# Patient Record
Sex: Male | Born: 1935 | Race: White | Hispanic: No | Smoking: Former smoker
Health system: Southern US, Community
[De-identification: ages and names within clinical notes are randomized; demographics above are authoritative.]

## PROBLEM LIST (undated history)

## (undated) DIAGNOSIS — I10 Essential (primary) hypertension: Secondary | ICD-10-CM

## (undated) DIAGNOSIS — J449 Chronic obstructive pulmonary disease, unspecified: Secondary | ICD-10-CM

## (undated) DIAGNOSIS — K219 Gastro-esophageal reflux disease without esophagitis: Secondary | ICD-10-CM

## (undated) DIAGNOSIS — J439 Emphysema, unspecified: Secondary | ICD-10-CM

## (undated) DIAGNOSIS — I739 Peripheral vascular disease, unspecified: Secondary | ICD-10-CM

## (undated) DIAGNOSIS — J438 Other emphysema: Secondary | ICD-10-CM

## (undated) DIAGNOSIS — H269 Unspecified cataract: Secondary | ICD-10-CM

## (undated) DIAGNOSIS — I498 Other specified cardiac arrhythmias: Secondary | ICD-10-CM

## (undated) DIAGNOSIS — G459 Transient cerebral ischemic attack, unspecified: Secondary | ICD-10-CM

## (undated) DIAGNOSIS — R7309 Other abnormal glucose: Secondary | ICD-10-CM

## (undated) DIAGNOSIS — E785 Hyperlipidemia, unspecified: Secondary | ICD-10-CM

## (undated) HISTORY — PX: EYE SURGERY: SHX253

## (undated) HISTORY — DX: Peripheral vascular disease, unspecified: I73.9

## (undated) HISTORY — DX: Emphysema, unspecified: J43.9

## (undated) HISTORY — DX: Unspecified cataract: H26.9

## (undated) HISTORY — PX: CATARACT EXTRACTION, BILATERAL: SHX1313

## (undated) HISTORY — PX: TONSILLECTOMY AND ADENOIDECTOMY: SUR1326

## (undated) HISTORY — DX: Other specified cardiac arrhythmias: I49.8

## (undated) HISTORY — PX: POLYPECTOMY: SHX149

## (undated) HISTORY — DX: Essential (primary) hypertension: I10

## (undated) HISTORY — DX: Other emphysema: J43.8

## (undated) HISTORY — DX: Hyperlipidemia, unspecified: E78.5

## (undated) HISTORY — DX: Other abnormal glucose: R73.09

## (undated) HISTORY — PX: COLONOSCOPY: SHX174

---

## 2004-08-28 ENCOUNTER — Ambulatory Visit: Payer: Self-pay | Admitting: Endocrinology

## 2004-11-28 ENCOUNTER — Ambulatory Visit: Payer: Self-pay | Admitting: Internal Medicine

## 2004-12-30 ENCOUNTER — Emergency Department (HOSPITAL_COMMUNITY): Admission: EM | Admit: 2004-12-30 | Discharge: 2004-12-30 | Payer: Self-pay | Admitting: Emergency Medicine

## 2005-01-06 ENCOUNTER — Ambulatory Visit: Payer: Self-pay | Admitting: Pulmonary Disease

## 2005-01-06 LAB — PULMONARY FUNCTION TEST

## 2005-01-13 ENCOUNTER — Ambulatory Visit: Payer: Self-pay | Admitting: Endocrinology

## 2005-01-15 ENCOUNTER — Ambulatory Visit: Payer: Self-pay | Admitting: Pulmonary Disease

## 2005-03-11 ENCOUNTER — Ambulatory Visit: Payer: Self-pay | Admitting: Pulmonary Disease

## 2005-08-07 ENCOUNTER — Ambulatory Visit: Payer: Self-pay | Admitting: Endocrinology

## 2005-09-03 ENCOUNTER — Ambulatory Visit: Payer: Self-pay | Admitting: Endocrinology

## 2005-11-11 ENCOUNTER — Ambulatory Visit: Payer: Self-pay | Admitting: Endocrinology

## 2005-11-11 ENCOUNTER — Ambulatory Visit: Payer: Self-pay | Admitting: Pulmonary Disease

## 2006-08-14 ENCOUNTER — Ambulatory Visit: Payer: Self-pay | Admitting: Endocrinology

## 2006-11-26 ENCOUNTER — Ambulatory Visit: Payer: Self-pay | Admitting: Endocrinology

## 2006-12-22 ENCOUNTER — Ambulatory Visit: Payer: Self-pay | Admitting: Endocrinology

## 2006-12-22 ENCOUNTER — Ambulatory Visit: Payer: Self-pay | Admitting: Pulmonary Disease

## 2006-12-22 LAB — CONVERTED CEMR LAB
ALT: 21 units/L (ref 0–40)
Albumin: 3.8 g/dL (ref 3.5–5.2)
Basophils Absolute: 0 10*3/uL (ref 0.0–0.1)
Basophils Relative: 0.2 % (ref 0.0–1.0)
Bilirubin Urine: NEGATIVE
Bilirubin, Direct: 0.2 mg/dL (ref 0.0–0.3)
Calcium: 8.9 mg/dL (ref 8.4–10.5)
Cholesterol: 107 mg/dL (ref 0–200)
Crystals: NEGATIVE
GFR calc Af Amer: 85 mL/min
GFR calc non Af Amer: 70 mL/min
Glucose, Bld: 122 mg/dL — ABNORMAL HIGH (ref 70–99)
HDL: 33.1 mg/dL — ABNORMAL LOW (ref >39.0)
Hemoglobin: 13.9 g/dL (ref 13.0–17.0)
Ketones, ur: NEGATIVE mg/dL
Lymphocytes Relative: 24.7 % (ref 12.0–46.0)
MCHC: 35 g/dL (ref 30.0–36.0)
Monocytes Absolute: 0.3 10*3/uL (ref 0.2–0.7)
Monocytes Relative: 4.4 % (ref 3.0–11.0)
Neutro Abs: 4.5 10*3/uL (ref 1.4–7.7)
Nitrite: NEGATIVE
PSA: 2.67 ng/mL (ref 0.10–4.00)
Specific Gravity, Urine: 1.015 (ref 1.000–1.03)
Urine Glucose: NEGATIVE mg/dL
Urobilinogen, UA: 0.2 (ref 0.0–1.0)
pH: 6.5 (ref 5.0–8.0)

## 2007-06-01 ENCOUNTER — Encounter: Payer: Self-pay | Admitting: Endocrinology

## 2007-06-01 DIAGNOSIS — R7309 Other abnormal glucose: Secondary | ICD-10-CM

## 2007-06-01 DIAGNOSIS — E785 Hyperlipidemia, unspecified: Secondary | ICD-10-CM

## 2007-06-01 DIAGNOSIS — R001 Bradycardia, unspecified: Secondary | ICD-10-CM | POA: Insufficient documentation

## 2007-06-01 DIAGNOSIS — I498 Other specified cardiac arrhythmias: Secondary | ICD-10-CM

## 2007-06-01 HISTORY — DX: Other specified cardiac arrhythmias: I49.8

## 2007-06-01 HISTORY — DX: Other abnormal glucose: R73.09

## 2007-06-01 HISTORY — DX: Hyperlipidemia, unspecified: E78.5

## 2007-09-21 ENCOUNTER — Ambulatory Visit: Payer: Self-pay | Admitting: Endocrinology

## 2008-03-08 ENCOUNTER — Ambulatory Visit: Payer: Self-pay | Admitting: Endocrinology

## 2008-03-09 ENCOUNTER — Telehealth (INDEPENDENT_AMBULATORY_CARE_PROVIDER_SITE_OTHER): Payer: Self-pay | Admitting: *Deleted

## 2008-03-28 ENCOUNTER — Ambulatory Visit: Payer: Self-pay | Admitting: Pulmonary Disease

## 2008-03-28 DIAGNOSIS — J449 Chronic obstructive pulmonary disease, unspecified: Secondary | ICD-10-CM

## 2008-03-28 DIAGNOSIS — J438 Other emphysema: Secondary | ICD-10-CM

## 2008-03-28 HISTORY — DX: Other emphysema: J43.8

## 2008-03-31 ENCOUNTER — Encounter: Payer: Self-pay | Admitting: Pulmonary Disease

## 2008-04-04 ENCOUNTER — Telehealth (INDEPENDENT_AMBULATORY_CARE_PROVIDER_SITE_OTHER): Payer: Self-pay | Admitting: *Deleted

## 2008-08-03 ENCOUNTER — Ambulatory Visit: Payer: Self-pay | Admitting: Endocrinology

## 2009-01-15 ENCOUNTER — Emergency Department (HOSPITAL_COMMUNITY): Admission: EM | Admit: 2009-01-15 | Discharge: 2009-01-15 | Payer: Self-pay | Admitting: Emergency Medicine

## 2009-01-15 ENCOUNTER — Telehealth (INDEPENDENT_AMBULATORY_CARE_PROVIDER_SITE_OTHER): Payer: Self-pay | Admitting: *Deleted

## 2009-01-19 ENCOUNTER — Ambulatory Visit: Payer: Self-pay | Admitting: Endocrinology

## 2009-01-19 DIAGNOSIS — I10 Essential (primary) hypertension: Secondary | ICD-10-CM | POA: Insufficient documentation

## 2009-01-19 HISTORY — DX: Essential (primary) hypertension: I10

## 2009-01-24 ENCOUNTER — Emergency Department (HOSPITAL_COMMUNITY): Admission: EM | Admit: 2009-01-24 | Discharge: 2009-01-24 | Payer: Self-pay | Admitting: Emergency Medicine

## 2009-01-29 ENCOUNTER — Ambulatory Visit: Payer: Self-pay | Admitting: Endocrinology

## 2009-01-29 DIAGNOSIS — J189 Pneumonia, unspecified organism: Secondary | ICD-10-CM | POA: Insufficient documentation

## 2009-01-29 DIAGNOSIS — R930 Abnormal findings on diagnostic imaging of skull and head, not elsewhere classified: Secondary | ICD-10-CM

## 2009-08-08 ENCOUNTER — Ambulatory Visit: Payer: Self-pay | Admitting: Endocrinology

## 2009-09-26 ENCOUNTER — Ambulatory Visit: Payer: Self-pay | Admitting: Pulmonary Disease

## 2010-03-25 ENCOUNTER — Telehealth: Payer: Self-pay | Admitting: Endocrinology

## 2010-04-23 ENCOUNTER — Ambulatory Visit: Payer: Self-pay | Admitting: Endocrinology

## 2010-06-10 ENCOUNTER — Telehealth: Payer: Self-pay | Admitting: Endocrinology

## 2010-08-01 ENCOUNTER — Ambulatory Visit: Payer: Self-pay | Admitting: Endocrinology

## 2010-11-10 LAB — CONVERTED CEMR LAB
ALT: 18 units/L (ref 0–53)
ALT: 19 units/L (ref 0–53)
AST: 21 units/L (ref 0–37)
Alkaline Phosphatase: 50 units/L (ref 39–117)
Alkaline Phosphatase: 50 units/L (ref 39–117)
BUN: 21 mg/dL (ref 6–23)
Basophils Absolute: 0.1 10*3/uL (ref 0.0–0.1)
Basophils Relative: 1 % (ref 0.0–1.0)
Basophils Relative: 1.1 % (ref 0.0–3.0)
Bilirubin, Direct: 0.2 mg/dL (ref 0.0–0.3)
CO2: 30 meq/L (ref 19–32)
Calcium: 9.1 mg/dL (ref 8.4–10.5)
Chloride: 107 meq/L (ref 96–112)
Cholesterol: 128 mg/dL (ref 0–200)
Cholesterol: 128 mg/dL (ref 0–200)
Creatinine, Ser: 1 mg/dL (ref 0.4–1.5)
Eosinophils Absolute: 0.3 10*3/uL (ref 0.0–0.7)
Eosinophils Absolute: 0.3 10*3/uL (ref 0.0–0.7)
Eosinophils Relative: 3.6 % (ref 0.0–5.0)
GFR calc Af Amer: 85 mL/min
HDL: 26.2 mg/dL — ABNORMAL LOW (ref >39.0)
HDL: 33.3 mg/dL — ABNORMAL LOW (ref >39.00)
Hemoglobin, Urine: NEGATIVE
Hgb A1c MFr Bld: 6.2 % (ref 4.6–6.5)
LDL Cholesterol: 68 mg/dL (ref 0–99)
LDL Cholesterol: 79 mg/dL (ref 0–99)
Leukocytes, UA: NEGATIVE
Lymphocytes Relative: 21 % (ref 12.0–46.0)
Lymphocytes Relative: 22.9 % (ref 12.0–46.0)
MCHC: 34.2 g/dL (ref 30.0–36.0)
MCV: 89.8 fL (ref 78.0–100.0)
Monocytes Absolute: 0.6 10*3/uL (ref 0.1–1.0)
Neutrophils Relative %: 65.9 % (ref 43.0–77.0)
Neutrophils Relative %: 67.7 % (ref 43.0–77.0)
PSA: 1.42 ng/mL (ref 0.10–4.00)
Platelets: 213 10*3/uL (ref 150–400)
Platelets: 224 10*3/uL (ref 150.0–400.0)
Potassium: 4.2 meq/L (ref 3.5–5.1)
RBC: 4.84 M/uL (ref 4.22–5.81)
RBC: 4.98 M/uL (ref 4.22–5.81)
Sodium: 141 meq/L (ref 135–145)
Specific Gravity, Urine: >=1.03 (ref 1.000–1.030)
Total Bilirubin: 0.9 mg/dL (ref 0.3–1.2)
Total CHOL/HDL Ratio: 4
Total Protein: 6.9 g/dL (ref 6.0–8.3)
Total Protein: 7.1 g/dL (ref 6.0–8.3)
Triglycerides: 134 mg/dL (ref 0.0–149.0)
Urobilinogen, UA: 0.2 (ref 0.0–1.0)
VLDL: 23 mg/dL (ref 0–40)
VLDL: 26.8 mg/dL (ref 0.0–40.0)
WBC: 9.8 10*3/uL (ref 4.5–10.5)
pH: 6 (ref 5.0–8.0)

## 2010-11-12 NOTE — Progress Notes (Signed)
  Phone Note Call from Patient Call back at Home Phone [phone]   Caller: Patient Call For: Minus Breeding MD Summary of Call: Pt currently takes,Triamterene 2 x's per week (cut back to this 6 mos ago). pt would like to take daily instead.(as it use to be) Pt would need chagned, prescription called in[facility]. Please advise. Please let pt know if this will happen. Initial call taken by: Verdell Face,  June 10, 2010 9:25 AM  Follow-up for Phone Call        left message on machine for pt to return my call. Margaret Pyle, CMA  June 10, 2010 11:26 AM  Pt states that his BP has been slightly elevated since decreasing BP meds and would like to go back to taking 1/2 tab once daily   [facility]   Follow-up by: Margaret Pyle, CMA,  June 10, 2010 1:51 PM  Additional Follow-up for Phone Call Additional follow up Details #1::        please do not increase--last ov said this would push bp too low. Additional Follow-up by: Minus Breeding MD,  June 10, 2010 2:09 PM    Additional Follow-up for Phone Call Additional follow up Details #2::    Pt states that SBP >140 and that it has not been this high, pt is very concerned. Pt is requesting to take BP meds 5 days a week if not once daily  Follow-up by: Margaret Pyle, CMA,  June 10, 2010 3:02 PM  Additional Follow-up for Phone Call Additional follow up Details #3:: Details for Additional Follow-up Action Taken: ov to address Additional Follow-up by: Minus Breeding MD,  June 10, 2010 4:14 PM  Pt informed via VM. Margaret Pyle, CMA  June 10, 2010 4:21 PM

## 2010-11-12 NOTE — Progress Notes (Signed)
Summary: Rx refill req  Phone Note Refill Request Message from:  Patient  Refills Requested: Medication #1:  ZOCOR 80 MG  TABS qhs.   Dosage confirmed as above?Dosage Confirmed   Supply Requested: 1 month Pt has scheduled appt with SAE. #30 refill to requesting pharmacy   Method Requested: Electronic Initial call taken by: Margaret Pyle, CMA,  March 25, 2010 3:48 PM    Prescriptions: ZOCOR 80 MG  TABS (SIMVASTATIN) qhs  #30 Tablet x 0   Entered by:   Margaret Pyle, CMA   Authorized by:   Minus Breeding MD   Signed by:   Margaret Pyle, CMA on 03/25/2010   Method used:   Electronically to        [facility]. [phone]* (retail)       [phone] [address]       [place], [place]  [address]       Ph: [phone]       Fax: [phone]   RxID:   [id number]

## 2010-11-12 NOTE — Assessment & Plan Note (Signed)
Summary: flu shot/sae/cd   Nurse Visit   Allergies: No Known Drug Allergies  Orders Added: 1)  Flu Vaccine 67yrs + MEDICARE PATIENTS [Q2039] 2)  Administration Flu vaccine - MCR [G0008] .lbmedflu   Flu Vaccine Consent Questions     Do you have a history of severe allergic reactions to this vaccine? no    Any prior history of allergic reactions to egg and/or gelatin? no    Do you have a sensitivity to the preservative Thimersol? no    Do you have a past history of Guillan-Barre Syndrome? no    Do you currently have an acute febrile illness? no    Have you ever had a severe reaction to latex? no    Vaccine information given and explained to patient? yes    Are you currently pregnant? no    Lot Number:[id number]   Exp Date:04/12/2011   Site Given  Left Deltoid IM Lanier Prude, [facility])  August 01, 2010 10:17 AM

## 2010-11-12 NOTE — Assessment & Plan Note (Signed)
Summary: CPX/SECURE HORIZONS/#/CD   Vital Signs:  Patient profile:   76 year old male Height:      72 inches (182.88 cm) Weight:      212 pounds (96.36 kg) BMI:     28.86 O2 Sat:      91 % on Room air Temp:     97.3 degrees F (36.28 degrees C) oral Pulse rate:   56 / minute BP sitting:   122 / 82  (left arm) Cuff size:   regular  Vitals Entered By: Brenton Grills MA (April 23, 2010 10:11 AM)  O2 Flow:  Room air CC: CPX/questions about Zocor, BP medication/aj   Primary Provider:  Everardo All  CC:  CPX/questions about Zocor and BP medication/aj.  History of Present Illness: here for regular wellness examination.  He's feeling pretty well in general, and does not drink etoh.  he seldom smokes.   Current Medications (verified): 1)  Advair Diskus 250-50 Mcg/dose  Misc (Fluticasone-Salmeterol) .... Inhale 1 Puff Two Times A Day 2)  Aspirin 81mg  .... Once Daily 3)  Proair Hfa 108 (90 Base) Mcg/act  Aers (Albuterol Sulfate) .Marland Kitchen.. 1-2 Puffs Every 4-6 Hours As Needed 4)  Zocor 80 Mg  Tabs (Simvastatin) .... Qhs  Allergies (verified): No Known Drug Allergies  Past History:  Past Medical History: Last updated: 03/08/2008 BRADYCARDIA (ICD-427.89) HYPERGLYCEMIA (ICD-790.29) HYPERLIPIDEMIA (ICD-272.4)  Family History: Reviewed history from 03/08/2008 and no changes required. mother had "male cancer"  Social History: Reviewed history from 03/08/2008 and no changes required. retired  married  Review of Systems  The patient denies fever, vision loss, decreased hearing, chest pain, syncope, dyspnea on exertion, prolonged cough, headaches, abdominal pain, melena, hematochezia, severe indigestion/heartburn, hematuria, suspicious skin lesions, and depression.         he has lost a few lbs, due to his efforts  Physical Exam  General:  normal appearance.   Head:  head: no deformity eyes: no periorbital swelling, no proptosis external nose and ears are normal mouth: no lesion  seen Neck:  Supple without thyroid enlargement or tenderness.  Lungs:  Clear to auscultation bilaterally. Normal respiratory effort.  Heart:  Regular rate and rhythm without murmurs or gallops noted. Normal S1,S2.   Abdomen:  abdomen is soft, nontender.  no hepatosplenomegaly.   not distended.  there is a self-reducing ventral hernia  Rectal:  normal external and internal exam.  heme neg  Prostate:  Normal size prostate without masses or tenderness.  Msk:  muscle bulk and strength are grossly normal.  no obvious joint swelling.  gait is normal and steady  Pulses:  dorsalis pedis intact bilat.  no carotid bruit  Extremities:  mycotic toenails.   no deformity.  no ulcer on the feet.  feet are of normal color and temp.  no edema  Neurologic:  cn 2-12 grossly intact.   readily moves all 4's.   sensation is intact to touch on the feet  Skin:  normal texture and temp.  no rash.  not diaphoretic  Cervical Nodes:  No significant adenopathy.  Psych:  Alert and cooperative; normal mood and affect; normal attention span and concentration.     Impression & Recommendations:  Problem # 1:  ROUTINE GENERAL MEDICAL EXAM@HEALTH  CARE FACL (ICD-V70.0)  Problem # 2:  HYPERTENSION (ICD-401.9) he needs a very small amount of maxzide  Medications Added to Medication List This Visit: 1)  Triamterene-hctz 37.5-25 Mg Caps (Triamterene-hctz) .... 1/2 tab twice a week  Other Orders: EKG w/ Interpretation (  93000) TLB-Lipid Panel (80061-LIPID) TLB-BMP (Basic Metabolic Panel-BMET) (80048-METABOL) TLB-CBC Platelet - w/Differential (85025-CBCD) TLB-Hepatic/Liver Function Pnl (80076-HEPATIC) TLB-TSH (Thyroid Stimulating Hormone) (84443-TSH) TLB-A1C / Hgb A1C (Glycohemoglobin) (83036-A1C) TLB-PSA (Prostate Specific Antigen) (84153-PSA) TLB-Udip w/ Micro (81001-URINE) Est. Patient 65& > (11914)  Patient Instructions: 1)  reduce maxzide to 1/2 pill twice a week. 2)  blood tests are being ordered for you  today.  please call [phone] to hear your test results. 3)  please consider these measures for your health:  minimize alcohol.  do not use tobacco products.  have a colonoscopy at least every 10 years from age 14.  keep firearms safely stored.  always use seat belts.  have working smoke alarms in your home.  see the dentist regularly.  never drive under the influence of alcohol or drugs (including prescription drugs).  those with fair skin should take precautions against the sun. 4)  please let me know what your wishes would be, if artificial life support measures should become necessary.  it is critically important to prevent falling down (keep floor areas well-lit, dry, and free of loose objects) 5)  (update:  we discussed code status.  pt requests full code, but would not want to be started or maintained on artificial life-support measures if there was not a reasonable chance of recovery) Prescriptions: ADVAIR DISKUS 250-50 MCG/DOSE  MISC (FLUTICASONE-SALMETEROL) inhale 1 puff two times a day  #1device x 12   Entered and Authorized by:   Minus Breeding MD   Signed by:   Minus Breeding MD on 04/23/2010   Method used:   Electronically to        [facility]. [phone]* (retail)       [phone]       [place], [place]  [address]       Ph: [phone]       Fax: [phone]   RxID:   [id number] ZOCOR 80 MG  TABS (SIMVASTATIN) qhs  #30 Tablet x 11   Entered and Authorized by:   Minus Breeding MD   Signed by:   Minus Breeding MD on 04/23/2010   Method used:   Electronically to        [facility]. [phone]* (retail)       [phone] [address]       [place], [place]  [address]       Ph: [phone]       Fax: [phone]   RxID:   [id number] TRIAMTERENE-HCTZ 37.5-25 MG CAPS (TRIAMTERENE-HCTZ) 1/2 tab twice a week  #30 x 3   Entered and Authorized by:   Minus Breeding MD   Signed by:   Minus Breeding MD on  04/23/2010   Method used:   Electronically to        [facility]. [phone]* (retail)       [phone] [address]       [place], [place]  [address]       Ph: [phone]       Fax: [phone]   RxID:   [id number]

## 2011-01-22 LAB — COMPREHENSIVE METABOLIC PANEL
ALT: 17 U/L (ref 0–53)
Calcium: 8.7 mg/dL (ref 8.4–10.5)
Creatinine, Ser: 0.81 mg/dL (ref 0.4–1.5)
GFR calc Af Amer: >60 mL/min (ref >60)
Glucose, Bld: 109 mg/dL — ABNORMAL HIGH (ref 70–99)
Sodium: 139 mEq/L (ref 135–145)
Total Protein: 6.2 g/dL (ref 6.0–8.3)

## 2011-01-22 LAB — POCT I-STAT, CHEM 8
BUN: 17 mg/dL (ref 6–23)
Calcium, Ion: 1.09 mmol/L — ABNORMAL LOW (ref 1.12–1.32)
Chloride: 105 mEq/L (ref 96–112)
HCT: 45 % (ref 39.0–52.0)
Potassium: 3.8 mEq/L (ref 3.5–5.1)

## 2011-01-22 LAB — DIFFERENTIAL
Basophils Relative: 0 % (ref 0–1)
Eosinophils Absolute: 0.1 10*3/uL (ref 0.0–0.7)
Monocytes Relative: 4 % (ref 3–12)
Neutro Abs: 8 10*3/uL — ABNORMAL HIGH (ref 1.7–7.7)
Neutrophils Relative %: 80 % — ABNORMAL HIGH (ref 43–77)

## 2011-01-22 LAB — CBC
HCT: 42.5 % (ref 39.0–52.0)
Hemoglobin: 14.5 g/dL (ref 13.0–17.0)
MCV: 90.1 fL (ref 78.0–100.0)
Platelets: 197 10*3/uL (ref 150–400)
RBC: 4.71 MIL/uL (ref 4.22–5.81)
WBC: 10 10*3/uL (ref 4.0–10.5)

## 2011-05-01 ENCOUNTER — Other Ambulatory Visit: Payer: Self-pay | Admitting: Endocrinology

## 2011-07-01 ENCOUNTER — Other Ambulatory Visit: Payer: Self-pay | Admitting: *Deleted

## 2011-07-01 MED ORDER — SIMVASTATIN 80 MG PO TABS: 80.0000 mg | ORAL_TABLET | Freq: Every day | ORAL | Status: DC

## 2011-07-01 MED ORDER — FLUTICASONE-SALMETEROL 250-50 MCG/DOSE IN AEPB: 1.0000 | INHALATION_SPRAY | Freq: Two times a day (BID) | RESPIRATORY_TRACT | Status: DC

## 2011-07-01 NOTE — Telephone Encounter (Signed)
R'cd fax from [facility] for refill of Simvastatin and Advair  Last OV-04/2010  Last filled-06/04/2011

## 2011-07-09 ENCOUNTER — Other Ambulatory Visit: Payer: Self-pay | Admitting: Endocrinology

## 2011-08-08 ENCOUNTER — Emergency Department (HOSPITAL_COMMUNITY): Payer: Medicare Other

## 2011-08-08 ENCOUNTER — Observation Stay (HOSPITAL_COMMUNITY)
Admission: EM | Admit: 2011-08-08 | Discharge: 2011-08-10 | Disposition: A | Discharge status: Home / Self Care | Payer: Medicare Other | Attending: Internal Medicine | Admitting: Internal Medicine

## 2011-08-08 DIAGNOSIS — E785 Hyperlipidemia, unspecified: Secondary | ICD-10-CM | POA: Insufficient documentation

## 2011-08-08 DIAGNOSIS — J449 Chronic obstructive pulmonary disease, unspecified: Secondary | ICD-10-CM | POA: Insufficient documentation

## 2011-08-08 DIAGNOSIS — R0602 Shortness of breath: Secondary | ICD-10-CM | POA: Insufficient documentation

## 2011-08-08 DIAGNOSIS — J4489 Other specified chronic obstructive pulmonary disease: Secondary | ICD-10-CM | POA: Insufficient documentation

## 2011-08-08 DIAGNOSIS — R55 Syncope and collapse: Principal | ICD-10-CM | POA: Insufficient documentation

## 2011-08-08 DIAGNOSIS — K219 Gastro-esophageal reflux disease without esophagitis: Secondary | ICD-10-CM | POA: Insufficient documentation

## 2011-08-08 DIAGNOSIS — Z23 Encounter for immunization: Secondary | ICD-10-CM | POA: Insufficient documentation

## 2011-08-08 DIAGNOSIS — I10 Essential (primary) hypertension: Secondary | ICD-10-CM | POA: Insufficient documentation

## 2011-08-08 HISTORY — DX: Essential (primary) hypertension: I10

## 2011-08-08 HISTORY — DX: Chronic obstructive pulmonary disease, unspecified: J44.9

## 2011-08-09 ENCOUNTER — Emergency Department (HOSPITAL_COMMUNITY): Payer: Medicare Other

## 2011-08-09 ENCOUNTER — Encounter (HOSPITAL_COMMUNITY): Payer: Self-pay | Admitting: Radiology

## 2011-08-09 DIAGNOSIS — R55 Syncope and collapse: Secondary | ICD-10-CM

## 2011-08-09 LAB — POCT I-STAT, CHEM 8
Calcium, Ion: 1.15 mmol/L (ref 1.12–1.32)
Glucose, Bld: 143 mg/dL — ABNORMAL HIGH (ref 70–99)
HCT: 45 % (ref 39.0–52.0)
Hemoglobin: 15.3 g/dL (ref 13.0–17.0)
Sodium: 140 mEq/L (ref 135–145)
TCO2: 25 mmol/L (ref 0–100)

## 2011-08-09 LAB — LIPID PANEL
HDL: 40 mg/dL (ref >39)
LDL Cholesterol: 66 mg/dL (ref 0–99)
Total CHOL/HDL Ratio: 3.1 RATIO
Triglycerides: 84 mg/dL (ref <150)
VLDL: 17 mg/dL (ref 0–40)

## 2011-08-09 LAB — DIFFERENTIAL
Basophils Absolute: 0.1 10*3/uL (ref 0.0–0.1)
Basophils Relative: 1 % (ref 0–1)
Eosinophils Relative: 2 % (ref 0–5)
Monocytes Relative: 7 % (ref 3–12)
Neutro Abs: 9.7 10*3/uL — ABNORMAL HIGH (ref 1.7–7.7)
Neutrophils Relative %: 77 % (ref 43–77)

## 2011-08-09 LAB — URINALYSIS, ROUTINE W REFLEX MICROSCOPIC
Bilirubin Urine: NEGATIVE
Glucose, UA: NEGATIVE mg/dL
Hgb urine dipstick: NEGATIVE
Ketones, ur: NEGATIVE mg/dL
Protein, ur: NEGATIVE mg/dL

## 2011-08-09 LAB — CBC
HCT: 41.8 % (ref 39.0–52.0)
Hemoglobin: 14.5 g/dL (ref 13.0–17.0)
MCH: 31 pg (ref 26.0–34.0)
MCV: 89.3 fL (ref 78.0–100.0)
Platelets: 194 10*3/uL (ref 150–400)
RBC: 4.68 MIL/uL (ref 4.22–5.81)
WBC: 12.6 10*3/uL — ABNORMAL HIGH (ref 4.0–10.5)

## 2011-08-09 LAB — CK TOTAL AND CKMB (NOT AT ARMC): Relative Index: 3.3 — ABNORMAL HIGH (ref 0.0–2.5)

## 2011-08-09 LAB — CARDIAC PANEL(CRET KIN+CKTOT+MB+TROPI): Troponin I: <0.3 ng/mL (ref <0.30)

## 2011-08-09 MED ORDER — IOHEXOL 350 MG/ML SOLN: 100.0000 mL | Freq: Once | INTRAVENOUS | Status: AC | PRN

## 2011-08-09 NOTE — H&P (Signed)
NAME:  Bryan Armstrong, Bryan Armstrong NO.:  [id number]  MEDICAL RECORD NO.:  [id number]  LOCATION:  [facility]                         FACILITY:  [facility]  PHYSICIAN:  Osvaldo Shipper, MD     DATE OF BIRTH:  1935-06-19  DATE OF ADMISSION:  08/09/2011 DATE OF DISCHARGE:                             HISTORY & PHYSICAL   PRIMARY CARE PHYSICIAN:  Romero Belling, MD/  PULMONOLOGIST:  Marcelyn Bruins, MD,FCCP  ADMISSION DIAGNOSES: 1. Syncope probably from orthostatic hypotension. 2. Elevated CK with negative troponin and an abnormal EKG. 3. History of chronic obstructive pulmonary disease. 4. History of hypertension, poorly controlled. 5. History of hyperlipidemia.  CHIEF COMPLAINT:  Passed out.  HISTORY OF PRESENT ILLNESS:  The patient is a 76 year old Caucasian male with a past medical history of COPD, hypertension, hyperlipidemia who was in a restaurant earlier today when he suddenly stood up from a sitting position, walked to the waitress and then he passed out.  Denies any chest pain either prior to or after the episode.  Denies any focal weakness.  No seizure-type activity.  Denies any chest pain, shortness of breath.  This has never happened to him before.  Denies any leg swelling.  Denies any fever, chills, or any sickness recently.  He hurt the right side of his face quite significantly, but denies any significant pain at this time.  MEDICATIONS AT HOME:  He does not know all of his medications.  He knows he is on Advair, aspirin, Lipitor.  The aspirin he takes 3 times a week and he is on a blood pressure medicine.  He thinks it is a diuretic but he is not sure.  ALLERGIES:  No known drug allergies.  PAST MEDICAL HISTORY:  Positive for COPD, hyperlipidemia, hypertension. No history of heart disease.  Denies any surgeries in his lifetime.  He has never had a stress test or a cardiac cath.  SOCIAL HISTORY:  HE lives in [place] with his wife.  Smokes 3 cigarettes on a daily  basis.  No alcohol use.  No illicit drug use.  He exercises by walking, playing golf, and [facility].  FAMILY HISTORY:  Positive for father with hypertension.  Mother had cervical cancer.  Both are deceased.  Brother had cancer, he is also deceased.  REVIEW OF SYSTEMS:  Eleven-system positive for weakness and malaise. HEENT:  Unremarkable except for the bruising on the right side of his face.  CARDIOVASCULAR:  As in HPI.  RESPIRATORY:  As in HPI.  GI: Positive for acid reflux once in a while.  GU:  Unremarkable. NEUROLOGICAL:  Unremarkable except as in HPI.  PSYCHIATRIC: Unremarkable.  DERMATOLOGICAL:  Unremarkable.  Other systems reviewed and found to be negative.  PHYSICAL EXAMINATION:  VITAL SIGNS:  Temperature 98.4, blood pressure 138/78, when he went from lying to standing position his blood pressure systolic went from 178 to 147, diastolic went from 96 to 81, heart rate went from 71 to 95, respiratory rate 16, saturation 96% on room air. GENERAL:  He is an overweight white male in no distress. HEENT:  Right side of the face is bruised with some periorbital swelling.  No tenderness is present at this time.  His pupils are otherwise equal, reacting.  No pallor.  No icterus.  Oral mucous membrane is moist.  No oral lesions are noted.  He does have some swelling over his lips as well. NECK:  Soft and supple.  No thyromegaly is appreciated.  No cervical, supraclavicular, inguinal lymphadenopathy is present. LUNGS:  Some wheezing bilaterally without any crackles. CARDIOVASCULAR:  S1, S2 is normal.  Regular.  No S3, S4, rubs, murmurs, or bruits. ABDOMEN:  Soft, nontender, nondistended.  Bowel sounds present.  No masses or organomegaly is appreciated. GU:  Deferred. MUSCULOSKELETAL:  Normal muscle mass and tone. NEUROLOGIC:  He is alert, oriented x3.  No focal neurological deficits are present.  Lab data reveals white cell count of 12.6, otherwise the rest of the  CBC parameters are normal.  His electrolytes are normal.  His CK is 237, MB 7.8, relative index 3.3.  Troponin however is negative.  UA is unremarkable.  He had a CT angio chest which did not show any PE.  He had a CT of the cervical spine which did not show any fractures.  CT of the head did not show any acute intracranial process.  CT of the maxillofacial region shows some right periorbital and infraorbital soft tissue swelling without any fractures.  He had EKG which shows a sinus rhythm with borderline first-degree AV block, possible left axis deviation.  There is RBBB.  There is some T- wave inversions in leads V2, V3, V4, V5.  Compared to EKG from 2010 these findings appear to be old.  ASSESSMENT:  This is a 76 year old Caucasian male with a past medical history as stated earlier who presents after an episode of syncope.  It sounds like this is most likely orthostatic hypotension.  However, he does have abnormal EKG with some abnormal cardiac enzymes  which require him to stay in the hospital for rule out protocol.  PLAN: 1. Syncope, likely orthostatic hypotension.  We will give him 1 L of     IV fluids gently.  We will get echocardiogram, carotid Dopplers.     Rule him out for ACS.  At this time there is no real indication to     consult Cardiology as he remains chest pain free.  If he indeed     rules in with positive troponin, that may be considered.  For now     aspirin will be given.  We will hold off on the statin for now     because of elevated CK. 2. History of COPD with mild wheezing.  We will give him albuterol     inhaler and put him back on his Advair. 3. Hypertension, poorly controlled.  I think he should not take his     diuretic anymore.  We will put him on lisinopril and see how he     responds to the same. 4. DVT prophylaxis will be given.  He is a full code.  Further management decisions will depend on results of further testing and the patient's  response to treatment.  Osvaldo Shipper, MD     GK/MEDQ  D:  08/09/2011  T:  08/09/2011  Job:  [id number]  cc:   Barbaraann Share, MD,FCCP Cleophas Dunker. Everardo All, MD  Electronically Signed by Osvaldo Shipper MD on 08/09/2011 08:10:27 PM

## 2011-08-10 DIAGNOSIS — I517 Cardiomegaly: Secondary | ICD-10-CM

## 2011-08-10 LAB — COMPREHENSIVE METABOLIC PANEL
ALT: 16 U/L (ref 0–53)
Alkaline Phosphatase: 53 U/L (ref 39–117)
BUN: 11 mg/dL (ref 6–23)
CO2: 25 mEq/L (ref 19–32)
Calcium: 8.7 mg/dL (ref 8.4–10.5)
GFR calc Af Amer: >90 mL/min (ref >90)
GFR calc non Af Amer: 83 mL/min — ABNORMAL LOW (ref >90)
Glucose, Bld: 108 mg/dL — ABNORMAL HIGH (ref 70–99)
Sodium: 140 mEq/L (ref 135–145)

## 2011-08-10 LAB — CBC
HCT: 41 % (ref 39.0–52.0)
Hemoglobin: 13.6 g/dL (ref 13.0–17.0)
MCH: 29.8 pg (ref 26.0–34.0)
RBC: 4.57 MIL/uL (ref 4.22–5.81)

## 2011-08-10 LAB — URINE CULTURE: Culture  Setup Time: 201210271110

## 2011-08-12 NOTE — Discharge Summary (Signed)
NAMEBANKS, CHAIKIN NO.:  [id number]  MEDICAL RECORD NO.:  [id number]  LOCATION:  3731                         FACILITY:  [facility]  PHYSICIAN:  Marinda Elk, M.D.DATE OF BIRTH:  Mar 05, 1935  DATE OF ADMISSION:  08/08/2011 DATE OF DISCHARGE:                              DISCHARGE SUMMARY   PRIMARY CARE DOCTOR:  Sean A. Everardo All, MD  PULMONOLOGIST:  Barbaraann Share, MD,FCCP  DISCHARGE DIAGNOSES: 1. Syncope, most likely orthostatic hypotension. 2. Gastroesophageal reflux disease. 3. Elevated cardiac markers. 4. Chronic obstructive pulmonary disease.  DISCHARGE MEDICATION: 1. Advair Diskus 250/50 mcg inhaled b.i.d. 2. Aspirin 81 mg daily. 3. Simvastatin 80 mg daily.  PROCEDURES PERFORMED:  A 2D echo that showed no aortic stenosis.  CT angio of the chest, there is no evidence of PE.  CT scan of the maxilla and face showed minimal mucosal thickening in the left maxilla, mild periorbital and infraorbital soft tissue swelling and hematoma.  C- spine, CT showed DJD changes.  No displaced fracture.  Head CT showed no acute intracranial abnormalities.  No evidence of intracranial hemorrhages.  Chest x-ray that showed no evidence of active cardiopulmonary disease.  Carotid Dopplers that showed no ICA stenosis.  BRIEF ADMITTING HISTORY AND PHYSICAL:  A 76 year old male with past medical history of chronic obstructive pulmonary disease, hypertension, hyperlipidemia who was in a restaurant earlier today when he suddenly stood up from sitting position to walking position and passed out.  He relates he was feeling nauseated, shaking, and sweating with this and then he passed out.  He denies any chest pain prior to the episode. Denies any focal weakness.  No seizure type activity.  No shortness of breath.  PHYSICAL EXAMINATION:  VITAL SIGNS:  Temperature 98, blood pressure 138/78.  Systolic blood pressure went from 178-147 systolic and diastolic went from 96-81  with a heart rate of 71-95. GENERAL:  He is an overweight male in no distress. HEENT:  Right-sided face bruise with some periorbital swelling.  No tenderness present at this time.  Pupils equally round, reactive.  Oral mucosa and membranes are moist. NECK:  Soft supple, no thyromegaly. LUNGS:  Good air movement.  Clear to auscultation. CARDIOVASCULAR:  Regular rate and rhythm with positive S1, S2.  No murmurs, rubs, or gallops.  LABS ON ADMISSION:  White count of 12.  Electrolytes within normal.  CK 37, MB 7.8, relative index 3.3.  Troponins were negative.  UA is negative.  Imaging as above.  BRIEF HOSPITAL COURSE: 1. Syncope most likely secondary to orthostatic hypotension.  He was     given IV fluids.  Carotid Doppler was done that showed no ICA     stenosis.  A 2D echo was done that showed no AS.  An imaging was     done with no acute processes.  He was monitored on telemetry with     no events.  Cardiac enzymes, just CK-MB was slightly elevated.     This is most likely secondary to trauma.  It was thought to be     secondary to orthostatic hypotension.  I have discontinued his     lisinopril which he will continue at home  without lisinopril and     follow up with his PCP to start medications as needed. 2. GERD.  Continue PPI.  No changes were made.  Vitals on day of discharge show temperature 98, pulse of 57, respirations 18, blood pressure 129/69, he was satting 95% on room air.  Labs on day of discharge show sodium 140, potassium 3.5, chloride 105, bicarb 25, glucose of 108, BUN of 11, creatinine 0.8.  LFTs within normal limits.  His white count is 9.3, hemoglobin of 13, platelet count of 183,000.  His troponins continued to be negative.  DISPOSITION:  The patient will follow up with his primary care doctor as an outpatient.  He will check his blood pressure and titrate blood pressure medications as needed.     Marinda Elk, M.D.     AF/MEDQ  D:  08/10/2011   T:  08/10/2011  Job:  [id number]  Electronically Signed by Marinda Elk M.D. on 08/12/2011 11:51:08 AM

## 2011-08-30 ENCOUNTER — Other Ambulatory Visit: Payer: Self-pay | Admitting: Endocrinology

## 2011-10-27 ENCOUNTER — Ambulatory Visit (INDEPENDENT_AMBULATORY_CARE_PROVIDER_SITE_OTHER): Payer: Medicare Other | Admitting: Endocrinology

## 2011-10-27 ENCOUNTER — Other Ambulatory Visit (INDEPENDENT_AMBULATORY_CARE_PROVIDER_SITE_OTHER): Payer: Medicare Other

## 2011-10-27 ENCOUNTER — Encounter: Payer: Self-pay | Admitting: Endocrinology

## 2011-10-27 DIAGNOSIS — Z0000 Encounter for general adult medical examination without abnormal findings: Secondary | ICD-10-CM | POA: Insufficient documentation

## 2011-10-27 DIAGNOSIS — R7309 Other abnormal glucose: Secondary | ICD-10-CM

## 2011-10-27 DIAGNOSIS — Z125 Encounter for screening for malignant neoplasm of prostate: Secondary | ICD-10-CM

## 2011-10-27 DIAGNOSIS — L509 Urticaria, unspecified: Secondary | ICD-10-CM

## 2011-10-27 LAB — PSA: PSA: 1.6 ng/mL (ref 0.10–4.00)

## 2011-10-27 LAB — HEMOGLOBIN A1C: Hgb A1c MFr Bld: 6 % (ref 4.6–6.5)

## 2011-10-27 MED ORDER — TRIAMTERENE-HCTZ 37.5-25 MG PO TABS: 1.0000 | ORAL_TABLET | Freq: Every day | ORAL | Status: DC

## 2011-10-27 MED ORDER — SIMVASTATIN 80 MG PO TABS: 80.0000 mg | ORAL_TABLET | Freq: Every day | ORAL | Status: DC

## 2011-10-27 NOTE — Progress Notes (Signed)
Subjective:    Patient ID: Bryan Armstrong, male    DOB: 1935/01/06, 77 y.o.   MRN: [id number]  HPI here for regular wellness examination.  He's feeling pretty well in general, and says chronic med probs are stable, except as noted below.   Past Medical History  Diagnosis Date  . Hypertension   . COPD (chronic obstructive pulmonary disease)   . BRADYCARDIA 06/01/2007  . EMPHYSEMA 03/28/2008  . HYPERGLYCEMIA 06/01/2007  . HYPERLIPIDEMIA 06/01/2007  . HYPERTENSION 01/19/2009    No past surgical history on file.  History   Social History  . Marital Status: Married    Spouse Name: N/A    Number of Children: N/A  . Years of Education: N/A   Occupational History  . Retired     Social History Main Topics  . Smoking status: Current Everyday Smoker  . Smokeless tobacco: Not on file   Comment: about 3 cigarettes per day  . Alcohol Use: Not on file  . Drug Use: Not on file  . Sexually Active: Not on file   Other Topics Concern  . Not on file   Social History Narrative  . No narrative on file    Current Outpatient Prescriptions on File Prior to Visit  Medication Sig Dispense Refill  . aspirin 81 MG tablet Take 81 mg by mouth daily.       . Fluticasone-Salmeterol (ADVAIR DISKUS) 250-50 MCG/DOSE AEPB Inhale 1 puff into the lungs 2 (two) times daily.  60 each  1  . simvastatin (ZOCOR) 80 MG tablet Take 1 tablet (80 mg total) by mouth at bedtime.  30 tablet  1    No Known Allergies  Family History  Problem Relation Age of Onset  . Cancer Mother     "Male" cancer    BP 110/70  Pulse 62  Temp(Src) 98.1 F (36.7 C) (Oral)  Ht 6' (1.829 m)  Wt 214 lb 12.8 oz (97.433 kg)  BMI 29.13 kg/m2  SpO2 92%   Review of Systems  Constitutional: Negative for fever and unexpected weight change.  HENT: Negative for hearing loss.   Eyes: Negative for visual disturbance.  Respiratory: Negative for shortness of breath.   Cardiovascular: Negative for chest pain.  Gastrointestinal:  Negative for anal bleeding.  Genitourinary: Negative for hematuria and difficulty urinating.  Musculoskeletal: Negative for gait problem.  Skin: Negative for rash.  Neurological: Negative for syncope and headaches.  Hematological: Does not bruise/bleed easily.  Psychiatric/Behavioral: Negative for dysphoric mood.      Objective:   Physical Exam VS: see vs page GEN: no distress HEAD: head: no deformity eyes: no periorbital swelling, no proptosis external nose and ears are normal mouth: no lesion seen NECK: supple, thyroid is not enlarged CHEST WALL: no deformity LUNGS: clear to auscultation BREASTS:  No gynecomastia CV: reg rate and rhythm, no murmur ABD: abdomen is soft, nontender.  no hepatosplenomegaly.  not distended.  no hernia RECTAL: normal external and internal exam.  heme neg. PROSTATE:  Normal size.  No nodule MUSCULOSKELETAL: muscle bulk and strength are grossly normal.  no obvious joint swelling.  gait is normal and steady EXTEMITIES: no deformity.  no ulcer on the feet.  feet are of normal color and temp.  no edema PULSES: dorsalis pedis intact bilat.  no carotid bruit NEURO:  cn 2-12 grossly intact.   readily moves all 4's.  sensation is intact to touch on the feet  NODES:  None palpable at the neck PSYCH: alert, oriented  x3.  Does not appear anxious nor depressed.       Assessment & Plan:  Wellness visit today, with problems stable, except as noted.   SEPARATE EVALUATION FOLLOWS--EACH PROBLEM HERE IS NEW, NOT RESPONDING TO TREATMENT, OR POSES SIGNIFICANT RISK TO THE PATIENT'S HEALTH: HISTORY OF THE PRESENT ILLNESS: Pt states 1 year of severe rash at the right chest/abd/pelvis/thigh.  No assoc itching.  He is unable to cite precip factor.  He says it does not bother him.   PAST MEDICAL HISTORY reviewed and up to date today REVIEW OF SYSTEMS: Denies fever. PHYSICAL EXAMINATION: Skin: moderate urticaria at these areas.   VITAL SIGNS:  See vs page GENERAL:  no distress IMPRESSION: Urticaria, new to me.  uncertain etiology PLAN: See instruction page

## 2011-10-27 NOTE — Patient Instructions (Addendum)
blood tests are being requested for you today.  please call [phone] to hear your test results.  You will be prompted to enter the 9-digit "MRN" number that appears at the top left of this page, followed by #.  Then you will hear the message. Call if the rash bothers you, and i would be happy to refer you to a dermatologist.   please consider these measures for your health:  minimize alcohol.  do not use tobacco products.  have a colonoscopy at least every 10 years from age 33.  keep firearms safely stored.  always use seat belts.  have working smoke alarms in your home.  see an eye doctor and dentist regularly.  never drive under the influence of alcohol or drugs (including prescription drugs).  those with fair skin should take precautions against the sun. please let me know what your wishes would be, if artificial life support measures should become necessary.  it is critically important to prevent falling down (keep floor areas well-lit, dry, and free of loose objects.  If you have a cane, walker, or wheelchair, you should use it, even for short trips around the house.  Also, try not to rush) Refer for a colonoscopy.  you will receive a phone call, about a day and time for an appointment You should have a vaccine against shingles (a painful rash which results from the  chickenpox infection which most people had many years ago).  This vaccine reduces, but does not totally eliminate the risk of shingles.  Because this is a medicare part d benefit, you should get it at a pharmacy.   Please return in 1 year. (update:  we discussed code status.  pt requests full code, but would not want to be started or maintained on artificial life-support measures if there was not a reasonable chance of recovery)

## 2011-11-02 DIAGNOSIS — L509 Urticaria, unspecified: Secondary | ICD-10-CM | POA: Insufficient documentation

## 2011-12-03 ENCOUNTER — Encounter: Payer: Self-pay | Admitting: Gastroenterology

## 2011-12-03 ENCOUNTER — Ambulatory Visit (AMBULATORY_SURGERY_CENTER): Payer: Medicare Other | Admitting: *Deleted

## 2011-12-03 VITALS — Ht 71.0 in | Wt 217.9 lb

## 2011-12-03 DIAGNOSIS — Z1211 Encounter for screening for malignant neoplasm of colon: Secondary | ICD-10-CM

## 2011-12-03 MED ORDER — MOVIPREP 100 G PO SOLR: ORAL | Status: DC

## 2011-12-17 ENCOUNTER — Ambulatory Visit (AMBULATORY_SURGERY_CENTER): Payer: Medicare Other | Admitting: Gastroenterology

## 2011-12-17 ENCOUNTER — Encounter: Payer: Self-pay | Admitting: Gastroenterology

## 2011-12-17 VITALS — BP 122/72 | HR 51 | Temp 95.3°F | Resp 18 | Ht 71.0 in | Wt 217.0 lb

## 2011-12-17 DIAGNOSIS — D126 Benign neoplasm of colon, unspecified: Secondary | ICD-10-CM

## 2011-12-17 DIAGNOSIS — Z1211 Encounter for screening for malignant neoplasm of colon: Secondary | ICD-10-CM

## 2011-12-17 DIAGNOSIS — K5730 Diverticulosis of large intestine without perforation or abscess without bleeding: Secondary | ICD-10-CM

## 2011-12-17 MED ORDER — SODIUM CHLORIDE 0.9 % IV SOLN: 500.0000 mL | INTRAVENOUS | Status: DC

## 2011-12-17 NOTE — Patient Instructions (Signed)
YOU HAD AN ENDOSCOPIC PROCEDURE TODAY AT THE [facility]: Refer to the procedure report that was given to you for any specific questions about what was found during the examination.  If the procedure report does not answer your questions, please call your gastroenterologist to clarify.  If you requested that your care partner not be given the details of your procedure findings, then the procedure report has been included in a sealed envelope for you to review at your convenience later.  YOU SHOULD EXPECT: Some feelings of bloating in the abdomen. Passage of more gas than usual.  Walking can help get rid of the air that was put into your GI tract during the procedure and reduce the bloating. If you had a lower endoscopy (such as a colonoscopy or flexible sigmoidoscopy) you may notice spotting of blood in your stool or on the toilet paper. If you underwent a bowel prep for your procedure, then you may not have a normal bowel movement for a few days.  DIET: Your first meal following the procedure should be a light meal and then it is ok to progress to your normal diet.  A half-sandwich or bowl of soup is an example of a good first meal.  Heavy or fried foods are harder to digest and may make you feel nauseous or bloated.  Likewise meals heavy in dairy and vegetables can cause extra gas to form and this can also increase the bloating.  Drink plenty of fluids but you should avoid alcoholic beverages for 24 hours.  ACTIVITY: Your care partner should take you home directly after the procedure.  You should plan to take it easy, moving slowly for the rest of the day.  You can resume normal activity the day after the procedure however you should NOT DRIVE or use heavy machinery for 24 hours (because of the sedation medicines used during the test).    SYMPTOMS TO REPORT IMMEDIATELY: A gastroenterologist can be reached at any hour.  During normal business hours, 8:30 AM to 5:00 PM Monday through Friday,  call [phone].  After hours and on weekends, please call the GI answering service at [phone] who will take a message and have the physician on call contact you.   Following lower endoscopy (colonoscopy or flexible sigmoidoscopy):  Excessive amounts of blood in the stool  Significant tenderness or worsening of abdominal pains  Swelling of the abdomen that is new, acute  Fever of 100F or higher    FOLLOW UP: If any biopsies were taken you will be contacted by phone or by letter within the next 1-3 weeks.  Call your gastroenterologist if you have not heard about the biopsies in 3 weeks.  Our staff will call the home number listed on your records the next business day following your procedure to check on you and address any questions or concerns that you may have at that time regarding the information given to you following your procedure. This is a courtesy call and so if there is no answer at the home number and we have not heard from you through the emergency physician on call, we will assume that you have returned to your regular daily activities without incident.  SIGNATURES/CONFIDENTIALITY: You and/or your care partner have signed paperwork which will be entered into your electronic medical record.  These signatures attest to the fact that that the information above on your After Visit Summary has been reviewed and is understood.  Full responsibility of the confidentiality   of this discharge information lies with you and/or your care-partner.     

## 2011-12-17 NOTE — Op Note (Signed)
[facility] [address] [place], [place]  [address]  COLONOSCOPY PROCEDURE REPORT PATIENT:  Bryan Armstrong, Bryan Armstrong  MR#:  [id number] BIRTHDATE:  04-08-1935, 77 yrs. old  GENDER:  male ENDOSCOPIST:  Rachael Fee, MD REF. BY:  Sean A. Everardo All, M.D. PROCEDURE DATE:  12/17/2011 PROCEDURE:  Colonoscopy with snare polypectomy ASA CLASS:  Class II INDICATIONS:  Routine Risk Screening MEDICATIONS:   Fentanyl 75 mcg IV, These medications were titrated to patient response per physician's verbal order, Versed 6 mg IV  DESCRIPTION OF PROCEDURE:   After the risks benefits and alternatives of the procedure were thoroughly explained, informed consent was obtained.  Digital rectal exam was performed and revealed no rectal masses.   The LB PCF-[id number] endoscope was introduced through the anus and advanced to the cecum, which was identified by both the appendix and ileocecal valve, without limitations.  The quality of the prep was fair..  The instrument was then slowly withdrawn as the colon was fully examined. <<PROCEDUREIMAGES>> FINDINGS:  Three small sessile polyps were found. These ranged in size from 2mm to 6mm, located in cecum and ascending segments, all were removed with cold snare and two were sent to pathology (jar 1).  Mild diverticulosis was found in the sigmoid to descending colon segments (see image1).  This was otherwise a normal examination of the colon (see image2, image3, and image4). Retroflexed views in the rectum revealed no abnormalities. COMPLICATIONS:  None  ENDOSCOPIC IMPRESSION: 1) Three small polyps, all removed and two were retrieved and sent to pathology 2) Mild diverticulosis in the sigmoid to descending colon segments 3) Otherwise normal examination  RECOMMENDATIONS: 1) If the polyp(s) removed today are proven to be adenomatous (pre-cancerous) polyps, you will need a colonoscopy in 3-5 years. You will receive a letter within 1-2 weeks with the  results of your biopsy as well as final recommendations. Please call my office if you have not received a letter after 3 weeks.  ______________________________ Rachael Fee, MD  n. eSIGNED:   Rachael Fee at 12/17/2011 10:57 AM  Karie Georges, [phone]

## 2011-12-17 NOTE — Progress Notes (Signed)
Patient did not experience any of the following events: a burn prior to discharge; a fall within the facility; wrong site/side/patient/procedure/implant event; or a hospital transfer or hospital admission upon discharge from the facility. (G8907) Patient did not have preoperative order for IV antibiotic SSI prophylaxis. (G8918)  

## 2011-12-18 ENCOUNTER — Telehealth: Payer: Self-pay | Admitting: *Deleted

## 2011-12-18 NOTE — Telephone Encounter (Signed)
  Follow up Call-  Call back number 12/17/2011  Post procedure Call Back phone  # [phone]  Permission to leave phone message Yes     Patient questions:  Do you have a fever, pain , or abdominal swelling? no Pain Score  0 *  Have you tolerated food without any problems? yes  Have you been able to return to your normal activities? yes  Do you have any questions about your discharge instructions: Diet   no Medications  no Follow up visit  no  Do you have questions or concerns about your Care? no  Actions: * If pain score is 4 or above: No action needed, pain <4.

## 2011-12-23 ENCOUNTER — Encounter: Payer: Self-pay | Admitting: Gastroenterology

## 2012-03-15 ENCOUNTER — Ambulatory Visit (INDEPENDENT_AMBULATORY_CARE_PROVIDER_SITE_OTHER): Payer: Medicare Other | Admitting: Pulmonary Disease

## 2012-03-15 ENCOUNTER — Encounter: Payer: Self-pay | Admitting: Pulmonary Disease

## 2012-03-15 VITALS — BP 102/62 | HR 60 | Temp 97.8°F | Ht 72.0 in | Wt 209.0 lb

## 2012-03-15 DIAGNOSIS — J449 Chronic obstructive pulmonary disease, unspecified: Secondary | ICD-10-CM

## 2012-03-15 DIAGNOSIS — J4489 Other specified chronic obstructive pulmonary disease: Secondary | ICD-10-CM

## 2012-03-15 MED ORDER — FLUTICASONE-SALMETEROL 250-50 MCG/DOSE IN AEPB: 1.0000 | INHALATION_SPRAY | Freq: Two times a day (BID) | RESPIRATORY_TRACT | Status: DC

## 2012-03-15 MED ORDER — ALBUTEROL SULFATE HFA 108 (90 BASE) MCG/ACT IN AERS: 2.0000 | INHALATION_SPRAY | Freq: Four times a day (QID) | RESPIRATORY_TRACT | Status: DC | PRN

## 2012-03-15 NOTE — Progress Notes (Signed)
  Subjective:    Patient ID: Bryan Armstrong, male    DOB: May 06, 1935, 77 y.o.   MRN: [id number]  HPI The patient comes in today for followup of his known COPD.  He has not been seen since 2011, but has been doing very well from a COPD standpoint.  He stays extremely active, and rarely has to use his rescue inhaler.  He is using Advair once a day, but will increase to twice a day if he is having more difficulty with his breathing.  He has had no acute exacerbations or significant chest infection since his last visit with me.   Review of Systems  Constitutional: Negative.  Negative for fever and unexpected weight change.  HENT: Positive for sneezing and postnasal drip. Negative for ear pain, nosebleeds, congestion, sore throat, rhinorrhea, trouble swallowing, dental problem and sinus pressure.   Eyes: Negative.  Negative for redness and itching.  Respiratory: Positive for cough. Negative for chest tightness, shortness of breath and wheezing.   Cardiovascular: Negative.  Negative for palpitations and leg swelling.  Gastrointestinal: Negative.  Negative for nausea and vomiting.  Genitourinary: Negative.  Negative for dysuria.  Musculoskeletal: Negative.  Negative for joint swelling.  Skin: Negative.  Negative for rash.  Neurological: Negative.  Negative for headaches.  Hematological: Negative.  Does not bruise/bleed easily.  Psychiatric/Behavioral: Negative.  Negative for dysphoric mood. The patient is not nervous/anxious.        Objective:   Physical Exam Well-developed male in no acute distress Nose without purulence or discharge noted Chest with mild decrease in breath sounds, no wheezing Cardiac exam with regular rate and rhythm Lower extremities without edema, no cyanosis Alert and oriented, moves all 4 extremities.       Assessment & Plan:

## 2012-03-15 NOTE — Assessment & Plan Note (Signed)
The patient seems to be doing well from a pulmonary standpoint, but unfortunately he continues to smoke a few cigarettes a day.  He is using Advair only in the mornings, but does increase the dose to b.i.d. When he is having issues with his breathing.  Currently, he feels that his exertional tolerance is at baseline, and he is very functional.  He is having a lot of allergy symptoms, but have improved since the summer is fast approaching.  I have asked him to stay on current regimen, and to try and stop smoking 100%.

## 2012-03-15 NOTE — Patient Instructions (Signed)
No change in medications. Stay as active as possible. followup with me in one year.

## 2012-03-16 ENCOUNTER — Ambulatory Visit: Payer: Medicare Other | Admitting: Pulmonary Disease

## 2012-03-19 ENCOUNTER — Telehealth: Payer: Self-pay | Admitting: Pulmonary Disease

## 2012-03-19 NOTE — Telephone Encounter (Signed)
Spoke with pt. He wants to have 12 refills on advair rather than 6. This makes sense since his he only needs f/u once per yr. I called the pharmacist and authorized them to give 12 refills rather than 6. Pt aware this was done and states nothing further needed.

## 2012-11-11 ENCOUNTER — Telehealth: Payer: Self-pay

## 2012-11-11 NOTE — Telephone Encounter (Signed)
Pt requesting refill of simvistatin to [facility]

## 2012-11-12 ENCOUNTER — Telehealth: Payer: Self-pay | Admitting: Endocrinology

## 2012-11-12 MED ORDER — SIMVASTATIN 80 MG PO TABS: 80.0000 mg | ORAL_TABLET | Freq: Every day | ORAL | Status: DC

## 2012-11-12 NOTE — Telephone Encounter (Signed)
Ov is due 

## 2012-11-15 ENCOUNTER — Other Ambulatory Visit: Payer: Self-pay | Admitting: *Deleted

## 2012-11-15 MED ORDER — TRIAMTERENE-HCTZ 37.5-25 MG PO TABS: 1.0000 | ORAL_TABLET | Freq: Every day | ORAL | Status: DC

## 2012-12-24 ENCOUNTER — Other Ambulatory Visit: Payer: Self-pay | Admitting: *Deleted

## 2012-12-24 MED ORDER — SIMVASTATIN 80 MG PO TABS: 80.0000 mg | ORAL_TABLET | Freq: Every day | ORAL | Status: DC

## 2013-02-04 ENCOUNTER — Other Ambulatory Visit: Payer: Self-pay | Admitting: *Deleted

## 2013-02-04 MED ORDER — SIMVASTATIN 80 MG PO TABS: 80.0000 mg | ORAL_TABLET | Freq: Every day | ORAL | Status: DC

## 2013-02-22 ENCOUNTER — Ambulatory Visit (INDEPENDENT_AMBULATORY_CARE_PROVIDER_SITE_OTHER): Payer: Medicare Other | Admitting: Endocrinology

## 2013-02-22 ENCOUNTER — Encounter: Payer: Self-pay | Admitting: Endocrinology

## 2013-02-22 VITALS — BP 122/78 | HR 80 | Ht 70.0 in | Wt 212.0 lb

## 2013-02-22 DIAGNOSIS — L0291 Cutaneous abscess, unspecified: Secondary | ICD-10-CM

## 2013-02-22 DIAGNOSIS — L0390 Cellulitis, unspecified: Secondary | ICD-10-CM

## 2013-02-22 MED ORDER — DOXYCYCLINE HYCLATE 100 MG PO TABS: 100.0000 mg | ORAL_TABLET | Freq: Two times a day (BID) | ORAL | Status: DC

## 2013-02-22 NOTE — Progress Notes (Signed)
  Subjective:    Patient ID: Bryan Armstrong, male    DOB: 1935/09/24, 78 y.o.   MRN: [id number]  HPI approx 2 weeks ago, pt noted a slight bump on the left buttock, but no assoc pain.  He removed a tick from there then.   Past Medical History  Diagnosis Date  . Hypertension   . COPD (chronic obstructive pulmonary disease)   . BRADYCARDIA 06/01/2007  . EMPHYSEMA 03/28/2008  . HYPERGLYCEMIA 06/01/2007  . HYPERLIPIDEMIA 06/01/2007  . HYPERTENSION 01/19/2009    No past surgical history on file.  History   Social History  . Marital Status: Married    Spouse Name: N/A    Number of Children: N/A  . Years of Education: N/A   Occupational History  . Retired     Social History Main Topics  . Smoking status: Current Every Day Smoker -- 0.20 packs/day for 40 years  . Smokeless tobacco: Not on file     Comment: about 3 cigarettes per day  . Alcohol Use: No  . Drug Use: Not on file  . Sexually Active: Not on file   Other Topics Concern  . Not on file   Social History Narrative  . No narrative on file    Current Outpatient Prescriptions on File Prior to Visit  Medication Sig Dispense Refill  . albuterol (PROAIR HFA) 108 (90 BASE) MCG/ACT inhaler Inhale 2 puffs into the lungs every 6 (six) hours as needed for shortness of breath.  18 g  6  . aspirin 81 MG tablet 1/2 tablet by mouth daily      . Fluticasone-Salmeterol (ADVAIR DISKUS) 250-50 MCG/DOSE AEPB Inhale 1 puff into the lungs 2 (two) times daily.  60 each  6  . simvastatin (ZOCOR) 80 MG tablet Take 1 tablet (80 mg total) by mouth at bedtime.  30 tablet  0  . triamterene-hydrochlorothiazide (MAXZIDE-25) 37.5-25 MG per tablet Take 1 each (1 tablet total) by mouth daily. PATIENT NEEDS TO MAKE APPOINTMENT WITH DR. Everardo All FOR FURTHER REFILLS.  30 tablet  0   No current facility-administered medications on file prior to visit.    No Known Allergies  Family History  Problem Relation Age of Onset  . Cancer Mother     "Male"  cancer  . Uterine cancer Mother   . Heart disease Father   . Colon cancer Neg Hx   . Esophageal cancer Neg Hx   . Rectal cancer Neg Hx   . Stomach cancer Neg Hx     BP 122/78  Pulse 80  Ht 5\' 10"  (1.778 m)  Wt 212 lb (96.163 kg)  BMI 30.42 kg/m2  SpO2 98%  Review of Systems Denies fever and itching.      Objective:   Physical Exam VITAL SIGNS:  See vs page GENERAL: no distress Skin: left buttock: puncture wound is healing, but the is a slight surrounding 8 cm rim of mild erythema.      Assessment & Plan:  ? Early cellulitis, due to tick bite.

## 2013-02-22 NOTE — Patient Instructions (Addendum)
i have sent a prescription to your pharmacy, for an antibiotic pill.   Please come in soon for a regular physical.

## 2013-02-24 DIAGNOSIS — L0390 Cellulitis, unspecified: Secondary | ICD-10-CM | POA: Insufficient documentation

## 2013-02-28 ENCOUNTER — Other Ambulatory Visit: Payer: Self-pay

## 2013-03-02 ENCOUNTER — Other Ambulatory Visit: Payer: Self-pay

## 2013-04-08 ENCOUNTER — Other Ambulatory Visit: Payer: Self-pay | Admitting: Pulmonary Disease

## 2013-05-31 ENCOUNTER — Encounter: Payer: Self-pay | Admitting: Endocrinology

## 2013-05-31 ENCOUNTER — Ambulatory Visit (INDEPENDENT_AMBULATORY_CARE_PROVIDER_SITE_OTHER): Payer: Medicare Other | Admitting: Endocrinology

## 2013-05-31 VITALS — BP 122/74 | HR 76 | Ht 71.0 in | Wt 211.0 lb

## 2013-05-31 DIAGNOSIS — Z125 Encounter for screening for malignant neoplasm of prostate: Secondary | ICD-10-CM

## 2013-05-31 DIAGNOSIS — I498 Other specified cardiac arrhythmias: Secondary | ICD-10-CM

## 2013-05-31 DIAGNOSIS — J309 Allergic rhinitis, unspecified: Secondary | ICD-10-CM | POA: Insufficient documentation

## 2013-05-31 DIAGNOSIS — Z0000 Encounter for general adult medical examination without abnormal findings: Secondary | ICD-10-CM

## 2013-05-31 DIAGNOSIS — R7309 Other abnormal glucose: Secondary | ICD-10-CM

## 2013-05-31 DIAGNOSIS — I10 Essential (primary) hypertension: Secondary | ICD-10-CM

## 2013-05-31 DIAGNOSIS — E785 Hyperlipidemia, unspecified: Secondary | ICD-10-CM

## 2013-05-31 LAB — BASIC METABOLIC PANEL
BUN: 19 mg/dL (ref 6–23)
Chloride: 104 mEq/L (ref 96–112)
Glucose, Bld: 112 mg/dL — ABNORMAL HIGH (ref 70–99)
Potassium: 4.4 mEq/L (ref 3.5–5.1)
Sodium: 137 mEq/L (ref 135–145)

## 2013-05-31 LAB — CBC WITH DIFFERENTIAL/PLATELET
Basophils Absolute: 0.1 10*3/uL (ref 0.0–0.1)
Hemoglobin: 14.6 g/dL (ref 13.0–17.0)
Lymphocytes Relative: 22.7 % (ref 12.0–46.0)
Monocytes Relative: 5.3 % (ref 3.0–12.0)
Neutro Abs: 5.7 10*3/uL (ref 1.4–7.7)
Neutrophils Relative %: 65.5 % (ref 43.0–77.0)
Platelets: 227 10*3/uL (ref 150.0–400.0)
RDW: 13.6 % (ref 11.5–14.6)

## 2013-05-31 LAB — URINALYSIS, ROUTINE W REFLEX MICROSCOPIC
Leukocytes, UA: NEGATIVE
Nitrite: NEGATIVE
Specific Gravity, Urine: 1.02 (ref 1.000–1.030)
pH: 6 (ref 5.0–8.0)

## 2013-05-31 LAB — LIPID PANEL: Cholesterol: 122 mg/dL (ref 0–200)

## 2013-05-31 LAB — HEPATIC FUNCTION PANEL
Albumin: 4 g/dL (ref 3.5–5.2)
Alkaline Phosphatase: 48 U/L (ref 39–117)
Total Protein: 7 g/dL (ref 6.0–8.3)

## 2013-05-31 LAB — HEMOGLOBIN A1C: Hgb A1c MFr Bld: 6.1 % (ref 4.6–6.5)

## 2013-05-31 MED ORDER — EPINEPHRINE 0.3 MG/0.3ML IJ SOAJ: 0.3000 mg | Freq: Once | INTRAMUSCULAR | Status: DC

## 2013-05-31 NOTE — Patient Instructions (Addendum)
please consider these measures for your health:  minimize alcohol.  do not use tobacco products.  have a colonoscopy at least every 10 years from age 78.  keep firearms safely stored.  always use seat belts.  have working smoke alarms in your home.  see an eye doctor and dentist regularly.  never drive under the influence of alcohol or drugs (including prescription drugs).  those with fair skin should take precautions against the sun. it is critically important to prevent falling down (keep floor areas well-lit, dry, and free of loose objects.  If you have a cane, walker, or wheelchair, you should use it, even for short trips around the house.  Also, try not to rush). blood tests are being requested for you today.  We'll contact you with results. Please return in 1 year.   

## 2013-05-31 NOTE — Progress Notes (Signed)
Subjective:    Patient ID: Bryan Armstrong, male    DOB: 10-Oct-1935, 78 y.o.   MRN: [id number]  HPI Pt is here for regular wellness examination, and is feeling pretty well in general, and says chronic med probs are stable, except as noted below Past Medical History  Diagnosis Date  . Hypertension   . COPD (chronic obstructive pulmonary disease)   . BRADYCARDIA 06/01/2007  . EMPHYSEMA 03/28/2008  . HYPERGLYCEMIA 06/01/2007  . HYPERLIPIDEMIA 06/01/2007  . HYPERTENSION 01/19/2009    No past surgical history on file.  History   Social History  . Marital Status: Married    Spouse Name: N/A    Number of Children: N/A  . Years of Education: N/A   Occupational History  . Retired     Social History Main Topics  . Smoking status: Current Every Day Smoker -- 0.20 packs/day for 40 years  . Smokeless tobacco: Not on file     Comment: about 3 cigarettes per day  . Alcohol Use: No  . Drug Use: Not on file  . Sexual Activity: Not on file   Other Topics Concern  . Not on file   Social History Narrative  . No narrative on file    Current Outpatient Prescriptions on File Prior to Visit  Medication Sig Dispense Refill  . ADVAIR DISKUS 250-50 MCG/DOSE AEPB inhale 1 dose INTO THE LUNGS twice a day  60 each  0  . aspirin 81 MG tablet 1/2 tablet by mouth daily      . doxycycline (VIBRA-TABS) 100 MG tablet Take 1 tablet (100 mg total) by mouth 2 (two) times daily.  20 tablet  0  . simvastatin (ZOCOR) 80 MG tablet Take 1 tablet (80 mg total) by mouth at bedtime.  30 tablet  0  . triamterene-hydrochlorothiazide (MAXZIDE-25) 37.5-25 MG per tablet Take 1 each (1 tablet total) by mouth daily. PATIENT NEEDS TO MAKE APPOINTMENT WITH DR. Everardo All FOR FURTHER REFILLS.  30 tablet  0  . albuterol (PROAIR HFA) 108 (90 BASE) MCG/ACT inhaler Inhale 2 puffs into the lungs every 6 (six) hours as needed for shortness of breath.  18 g  6   No current facility-administered medications on file prior to visit.     No Known Allergies  Family History  Problem Relation Age of Onset  . Cancer Mother     "Male" cancer  . Uterine cancer Mother   . Heart disease Father   . Colon cancer Neg Hx   . Esophageal cancer Neg Hx   . Rectal cancer Neg Hx   . Stomach cancer Neg Hx     BP 122/74  Pulse 76  Ht 5\' 11"  (1.803 m)  Wt 211 lb (95.709 kg)  BMI 29.44 kg/m2  SpO2 97%     Review of Systems  Constitutional: Negative for fever and unexpected weight change.  HENT: Negative for hearing loss.   Eyes: Negative for visual disturbance.  Respiratory: Negative for shortness of breath.   Cardiovascular: Negative for chest pain.  Gastrointestinal: Negative for anal bleeding.  Endocrine: Negative for cold intolerance.  Genitourinary: Negative for hematuria and difficulty urinating.  Musculoskeletal: Negative for back pain.  Skin: Negative for rash.  Allergic/Immunologic: Positive for environmental allergies.  Neurological: Negative for syncope and numbness.  Hematological: Does not bruise/bleed easily.  Psychiatric/Behavioral: Negative for dysphoric mood.       Objective:   Physical Exam VS: see vs page GEN: no distress HEAD: head: no deformity eyes: no  periorbital swelling, no proptosis external nose and ears are normal mouth: no lesion seen NECK: supple, thyroid is not enlarged CHEST WALL: no deformity LUNGS: clear to auscultation BREASTS:  No gynecomastia CV: reg rate and rhythm, no murmur ABD: abdomen is soft, nontender.  no hepatosplenomegaly.  not distended.  no hernia. RECTAL: normal external and internal exam.  heme neg. PROSTATE:  Normal size.  No nodule MUSCULOSKELETAL: muscle bulk and strength are grossly normal.  no obvious joint swelling.  gait is normal and steady EXTEMITIES: no deformity.  no ulcer on the feet.  feet are of normal color and temp.  no edema.  There is bilateral onychomycosis and varicosities PULSES: dorsalis pedis intact bilat.  no carotid  bruit NEURO:  cn 2-12 grossly intact.   readily moves all 4's.  sensation is intact to touch on the feet SKIN:  Normal texture and temperature.  No rash or suspicious lesion is visible.   NODES:  None palpable at the neck PSYCH: alert, oriented x3.  Does not appear anxious nor depressed.   i reviewed electrocardiogram: minimal abnormality.      Assessment & Plan:  Wellness visit today, with problems stable, except as noted.  we discussed code status.  pt requests full code, but would not want to be started or maintained on artificial life-support measures if there was not a reasonable chance of recovery

## 2013-06-01 ENCOUNTER — Telehealth: Payer: Self-pay | Admitting: Pulmonary Disease

## 2013-06-01 NOTE — Telephone Encounter (Signed)
Error.Bryan Armstrong ° °

## 2013-06-28 ENCOUNTER — Ambulatory Visit (INDEPENDENT_AMBULATORY_CARE_PROVIDER_SITE_OTHER): Payer: Medicare Other

## 2013-06-28 DIAGNOSIS — Z23 Encounter for immunization: Secondary | ICD-10-CM

## 2013-07-05 ENCOUNTER — Encounter: Payer: Self-pay | Admitting: Pulmonary Disease

## 2013-07-05 ENCOUNTER — Ambulatory Visit (INDEPENDENT_AMBULATORY_CARE_PROVIDER_SITE_OTHER): Payer: Medicare Other | Admitting: Pulmonary Disease

## 2013-07-05 VITALS — BP 104/72 | HR 63 | Temp 98.2°F | Ht 71.0 in | Wt 219.6 lb

## 2013-07-05 DIAGNOSIS — F17200 Nicotine dependence, unspecified, uncomplicated: Secondary | ICD-10-CM

## 2013-07-05 DIAGNOSIS — J449 Chronic obstructive pulmonary disease, unspecified: Secondary | ICD-10-CM

## 2013-07-05 NOTE — Assessment & Plan Note (Signed)
The patient appears to be doing very well from a COPD standpoint, but I have encouraged him again to work on total smoking cessation.  I have asked him to stay on his maintenance inhaler, and also to keep a rescue inhaler handy if he is away from home.  He will followup with me again in one year doing well.

## 2013-07-05 NOTE — Patient Instructions (Addendum)
No change in medications. Continue to stay active. Work on total smoking cessation as we discussed. followup with me in one year if doing well.

## 2013-07-05 NOTE — Progress Notes (Signed)
  Subjective:    Patient ID: Bryan Armstrong, male    DOB: 04-Nov-1934, 78 y.o.   MRN: [id number]  HPI The patient comes in today for followup of his known mild COPD.  He has been staying on his current bronchodilator regimen, and feels that he is doing very well from an exertional tolerance standpoint.  He has been playing golf and staying active.  He has not had an acute exacerbation since the last visit, but he has continued to smoke.  I have counseled him again on this.    Review of Systems  Constitutional: Negative for fever and unexpected weight change.  HENT: Negative for ear pain, nosebleeds, congestion, sore throat, rhinorrhea, sneezing, trouble swallowing, dental problem, postnasal drip and sinus pressure.   Eyes: Negative for redness and itching.  Respiratory: Negative for cough, chest tightness, shortness of breath and wheezing.   Cardiovascular: Negative for palpitations and leg swelling.  Gastrointestinal: Negative for nausea and vomiting.  Genitourinary: Negative for dysuria.  Musculoskeletal: Negative for joint swelling.  Skin: Negative for rash.  Neurological: Negative for headaches.  Hematological: Does not bruise/bleed easily.  Psychiatric/Behavioral: Negative for dysphoric mood. The patient is not nervous/anxious.        Objective:   Physical Exam Well-developed male in no acute distress Nose without purulence or discharge noted Neck without lymphadenopathy or thyromegaly Chest with mild decrease in breath sounds, no wheezing Cardiac exam with regular rate and rhythm Lower extremities with mild ankle edema, no cyanosis Alert and oriented, moves all 4 extremities.       Assessment & Plan:

## 2013-07-08 ENCOUNTER — Other Ambulatory Visit: Payer: Self-pay | Admitting: Pulmonary Disease

## 2013-12-07 ENCOUNTER — Other Ambulatory Visit: Payer: Self-pay

## 2013-12-07 MED ORDER — SIMVASTATIN 80 MG PO TABS: 80.0000 mg | ORAL_TABLET | Freq: Every day | ORAL | Status: DC

## 2013-12-26 ENCOUNTER — Other Ambulatory Visit: Payer: Self-pay

## 2013-12-26 MED ORDER — TRIAMTERENE-HCTZ 37.5-25 MG PO TABS: ORAL_TABLET | ORAL | Status: DC

## 2014-01-23 ENCOUNTER — Other Ambulatory Visit: Payer: Self-pay

## 2014-01-23 MED ORDER — SIMVASTATIN 80 MG PO TABS: 80.0000 mg | ORAL_TABLET | Freq: Every day | ORAL | Status: DC

## 2014-03-24 ENCOUNTER — Other Ambulatory Visit: Payer: Self-pay

## 2014-03-24 MED ORDER — TRIAMTERENE-HCTZ 37.5-25 MG PO TABS: ORAL_TABLET | ORAL | Status: DC

## 2014-05-23 ENCOUNTER — Encounter (INDEPENDENT_AMBULATORY_CARE_PROVIDER_SITE_OTHER): Payer: Medicare Other | Admitting: Ophthalmology

## 2014-06-06 ENCOUNTER — Encounter (INDEPENDENT_AMBULATORY_CARE_PROVIDER_SITE_OTHER): Payer: Medicare Other | Admitting: Ophthalmology

## 2014-06-06 DIAGNOSIS — H35379 Puckering of macula, unspecified eye: Secondary | ICD-10-CM

## 2014-06-06 DIAGNOSIS — H35039 Hypertensive retinopathy, unspecified eye: Secondary | ICD-10-CM

## 2014-06-06 DIAGNOSIS — H3530 Unspecified macular degeneration: Secondary | ICD-10-CM

## 2014-06-06 DIAGNOSIS — H26499 Other secondary cataract, unspecified eye: Secondary | ICD-10-CM

## 2014-06-06 DIAGNOSIS — I10 Essential (primary) hypertension: Secondary | ICD-10-CM

## 2014-07-10 ENCOUNTER — Ambulatory Visit (INDEPENDENT_AMBULATORY_CARE_PROVIDER_SITE_OTHER): Payer: Medicare Other | Admitting: Ophthalmology

## 2014-07-11 ENCOUNTER — Encounter (INDEPENDENT_AMBULATORY_CARE_PROVIDER_SITE_OTHER): Payer: Medicare Other | Admitting: Ophthalmology

## 2014-07-11 DIAGNOSIS — H2700 Aphakia, unspecified eye: Secondary | ICD-10-CM

## 2014-07-12 ENCOUNTER — Other Ambulatory Visit: Payer: Self-pay | Admitting: Pulmonary Disease

## 2014-08-08 ENCOUNTER — Ambulatory Visit (INDEPENDENT_AMBULATORY_CARE_PROVIDER_SITE_OTHER): Payer: Medicare Other | Admitting: Pulmonary Disease

## 2014-08-08 ENCOUNTER — Encounter: Payer: Self-pay | Admitting: Pulmonary Disease

## 2014-08-08 VITALS — BP 110/68 | HR 66 | Temp 97.3°F | Ht 71.0 in | Wt 218.0 lb

## 2014-08-08 DIAGNOSIS — J438 Other emphysema: Secondary | ICD-10-CM

## 2014-08-08 MED ORDER — FLUTICASONE-SALMETEROL 250-50 MCG/DOSE IN AEPB: INHALATION_SPRAY | RESPIRATORY_TRACT | Status: DC

## 2014-08-08 NOTE — Assessment & Plan Note (Signed)
The patient continues to do well from a pulmonary standpoint, with stable exertional tolerance and no acute exacerbation. I have encouraged him to stop smoking 100%, and to stay on his current medication. He has already had his flu shot this year. I will see him back in one year if doing well.

## 2014-08-08 NOTE — Addendum Note (Signed)
Addended by: Virl Cagey on: 08/08/2014 11:46 AM   Modules accepted: Orders

## 2014-08-08 NOTE — Patient Instructions (Signed)
No change in breathing medications If allergy symptoms, can take zyrtec 10mg  (cetirizine) each day if needed.  followup with me again in one year.

## 2014-08-08 NOTE — Progress Notes (Signed)
   Subjective:    Patient ID: Bryan Armstrong, male    DOB: 1935-03-13, 79 y.o.   MRN: [id number]  HPI Patient comes in today for follow-up of his known COPD. He has done well on Advair alone, and has not had an acute exacerbation or flare up since the last visit. He feels that his exertional tolerance is at baseline, and is satisfied with his functional status. Unfortunately he continues to smoke, and we have talked about this again.   Review of Systems  Constitutional: Negative for fever and unexpected weight change.  HENT: Negative for congestion, dental problem, ear pain, nosebleeds, postnasal drip, rhinorrhea, sinus pressure, sneezing, sore throat and trouble swallowing.   Eyes: Negative for redness and itching.  Respiratory: Positive for cough and wheezing. Negative for chest tightness and shortness of breath.        No change  Cardiovascular: Negative for palpitations and leg swelling.  Gastrointestinal: Negative for nausea and vomiting.  Genitourinary: Negative for dysuria.  Musculoskeletal: Negative for joint swelling.  Skin: Negative for rash.  Neurological: Negative for headaches.  Hematological: Does not bruise/bleed easily.  Psychiatric/Behavioral: Negative for dysphoric mood. The patient is not nervous/anxious.        Objective:   Physical Exam Well-developed male in no acute distress Nose without purulence or discharge noted Neck without lymphadenopathy or thyromegaly Chest totally clear to auscultation, no wheezing Cardiac exam with regular rate and rhythm Lower extremities without edema, no cyanosis Alert and oriented, moves all 4 extremities.       Assessment & Plan:

## 2015-05-29 DIAGNOSIS — Z23 Encounter for immunization: Secondary | ICD-10-CM | POA: Diagnosis not present

## 2015-08-28 ENCOUNTER — Encounter: Payer: Self-pay | Admitting: Pulmonary Disease

## 2015-08-28 ENCOUNTER — Ambulatory Visit (INDEPENDENT_AMBULATORY_CARE_PROVIDER_SITE_OTHER): Payer: Medicare Other | Admitting: Pulmonary Disease

## 2015-08-28 VITALS — BP 122/66 | HR 72 | Ht 71.0 in | Wt 214.8 lb

## 2015-08-28 DIAGNOSIS — Z23 Encounter for immunization: Secondary | ICD-10-CM

## 2015-08-28 DIAGNOSIS — J438 Other emphysema: Secondary | ICD-10-CM | POA: Diagnosis not present

## 2015-08-28 NOTE — Progress Notes (Signed)
   Subjective:    Patient ID: Bryan Armstrong, male    DOB: [birth year], 80 y.o.   MRN: [id number]  HPI Follow-up for COPD.  Bryan Armstrong is a 80 year old with mild COPD. He is a former patient of Dr. Gwenette Greet. He is doing well with this current inhaler therapy. He is on albuterol which she takes 1-2 times every day. He is also on albuterol when necessary. However he does not need his rescue inhaler. He does not have any recent exacerbations or ER visits.  His chief complaint today is allergy symptoms manifested as runny nose and itchy eyes, redness after he is exposed to dust. He still continues to smoke. He used to smoke about half pack a day for about 40 years. However over the past 5 years he has cut down to about 1 cigarette today.  Data: PFTs [01/06/05] FVC 4.50 [96%] FEV1 2.85 [89] F/F 63 TLC 7.76 [111%] DLCO 132%. Mild airway obstruction, normal lung volumes, no bronchial dilator response.  Chest x-ray [08/09/11] No acute cardiopulmonary abnormalities.  Past Medical History  Diagnosis Date  . Hypertension   . COPD (chronic obstructive pulmonary disease) ([place])   . BRADYCARDIA 06/01/2007  . EMPHYSEMA 03/28/2008  . HYPERGLYCEMIA 06/01/2007  . HYPERLIPIDEMIA 06/01/2007  . HYPERTENSION 01/19/2009    Current outpatient prescriptions:  .  albuterol (PROAIR HFA) 108 (90 BASE) MCG/ACT inhaler, Inhale 2 puffs into the lungs every 6 (six) hours as needed for shortness of breath., Disp: 18 g, Rfl: 6 .  aspirin 81 MG tablet, 1/2 tablet by mouth daily, Disp: , Rfl:  .  EPINEPHrine (EPIPEN 2-PAK) 0.3 mg/0.3 mL SOAJ injection, Inject 0.3 mL (0.3 mg total) into the muscle once., Disp: 1 Device, Rfl: 1 .  Fluticasone-Salmeterol (ADVAIR DISKUS) 250-50 MCG/DOSE AEPB, inhale 1 dose by mouth twice a day, Disp: 60 each, Rfl: 11 .  simvastatin (ZOCOR) 80 MG tablet, Take 1 tablet (80 mg total) by mouth at bedtime., Disp: 90 tablet, Rfl: 0 .  triamterene-hydrochlorothiazide (MAXZIDE-25) 37.5-25 MG per  tablet, Take 0.5-1 tablet daily., Disp: 30 tablet, Rfl: 2   Review of Systems Denies any cough, sputum production, wheezing, hemoptysis. He has dyspnea on exertion at baseline. Complains of nasal discharge, red mass in the eye, itching Denies any chest pain, palpitations. Denies any nausea, vomiting, diarrhea, constipation. All other review of systems are negative.    Objective:   Physical Exam Blood pressure 122/66, pulse 72, height 5\' 11"  (1.803 m), weight 214 lb 12.8 oz (97.433 kg), SpO2 96 %.  Gen.: Awake, oriented. No apparent distress Neuro: No gross focal deficits. Neck: No JVD, lymphadenopathy, thyromegaly. RS: Clear to auscultation. No wheeze, crackles. CVS: S1-S2 heard, no murmurs rubs gallops. Abdomen: Soft, positive bowel sounds. Extremities: No edema.    Assessment & Plan:  Mild COPD,  GOLD class I  Stable on advair. He hardly needs to use his rescue inhaler. He does have some allergic rhinitis, eye redness, itching on exposure to dust. He is asking for any medication to help with this. I advised him to take Allegra OTC as needed.  Plan:: - Continue Advair - Allegra OTC. As needed - Encouraged to quit smoking completely. - Flu vaccination today  Return to clinic in 1 year.  Marshell Garfinkel MD [facility] Pager [phone] If no answer or after 3pm call: [phone] 08/28/2015, 1:54 PM

## 2015-08-28 NOTE — Patient Instructions (Signed)
Continue using the Advair as prescribed. Encouraged smoking cessation. You will get a flu vaccination today. You can use Allegra OTC as needed for allergy symptoms.  Return to clinic in 1 year

## 2015-09-11 ENCOUNTER — Other Ambulatory Visit: Payer: Self-pay | Admitting: *Deleted

## 2015-09-11 MED ORDER — FLUTICASONE-SALMETEROL 250-50 MCG/DOSE IN AEPB: INHALATION_SPRAY | RESPIRATORY_TRACT | Status: DC

## 2015-10-18 ENCOUNTER — Telehealth: Payer: Self-pay | Admitting: Pulmonary Disease

## 2015-10-18 MED ORDER — FLUTICASONE-SALMETEROL 250-50 MCG/DOSE IN AEPB: INHALATION_SPRAY | RESPIRATORY_TRACT | Status: DC

## 2015-10-18 NOTE — Telephone Encounter (Signed)
Called spoke with pt. Aware RX has been sent in. Nothing further needed 

## 2015-10-23 ENCOUNTER — Encounter: Payer: Self-pay | Admitting: Endocrinology

## 2015-10-23 ENCOUNTER — Ambulatory Visit (INDEPENDENT_AMBULATORY_CARE_PROVIDER_SITE_OTHER): Payer: Medicare Other | Admitting: Endocrinology

## 2015-10-23 VITALS — BP 164/106 | HR 84 | Temp 98.3°F | Ht 71.0 in | Wt 214.0 lb

## 2015-10-23 DIAGNOSIS — I10 Essential (primary) hypertension: Secondary | ICD-10-CM

## 2015-10-23 DIAGNOSIS — Z23 Encounter for immunization: Secondary | ICD-10-CM | POA: Diagnosis not present

## 2015-10-23 DIAGNOSIS — R001 Bradycardia, unspecified: Secondary | ICD-10-CM | POA: Diagnosis not present

## 2015-10-23 DIAGNOSIS — Z125 Encounter for screening for malignant neoplasm of prostate: Secondary | ICD-10-CM

## 2015-10-23 DIAGNOSIS — Z0000 Encounter for general adult medical examination without abnormal findings: Secondary | ICD-10-CM | POA: Diagnosis not present

## 2015-10-23 DIAGNOSIS — E785 Hyperlipidemia, unspecified: Secondary | ICD-10-CM

## 2015-10-23 DIAGNOSIS — R7309 Other abnormal glucose: Secondary | ICD-10-CM | POA: Diagnosis not present

## 2015-10-23 LAB — LIPID PANEL
CHOL/HDL RATIO: 3
Cholesterol: 132 mg/dL (ref 0–200)
HDL: 38.9 mg/dL — AB (ref >39.00)
LDL Cholesterol: 68 mg/dL (ref 0–99)
NONHDL: 92.86
TRIGLYCERIDES: 124 mg/dL (ref 0.0–149.0)
VLDL: 24.8 mg/dL (ref 0.0–40.0)

## 2015-10-23 LAB — BASIC METABOLIC PANEL
BUN: 21 mg/dL (ref 6–23)
CALCIUM: 9.3 mg/dL (ref 8.4–10.5)
CHLORIDE: 100 meq/L (ref 96–112)
CO2: 31 mEq/L (ref 19–32)
CREATININE: 0.99 mg/dL (ref 0.40–1.50)
GFR: 77.53 mL/min (ref >60.00)
Glucose, Bld: 104 mg/dL — ABNORMAL HIGH (ref 70–99)
Potassium: 4.2 mEq/L (ref 3.5–5.1)
Sodium: 138 mEq/L (ref 135–145)

## 2015-10-23 LAB — HEMOGLOBIN A1C: HEMOGLOBIN A1C: 6 % (ref 4.6–6.5)

## 2015-10-23 LAB — HEPATIC FUNCTION PANEL
ALT: 17 U/L (ref 0–53)
AST: 18 U/L (ref 0–37)
Albumin: 4.4 g/dL (ref 3.5–5.2)
Alkaline Phosphatase: 54 U/L (ref 39–117)
Bilirubin, Direct: 0.1 mg/dL (ref 0.0–0.3)
TOTAL PROTEIN: 6.8 g/dL (ref 6.0–8.3)
Total Bilirubin: 0.6 mg/dL (ref 0.2–1.2)

## 2015-10-23 LAB — CBC WITH DIFFERENTIAL/PLATELET
BASOS PCT: 0.4 % (ref 0.0–3.0)
Basophils Absolute: 0 10*3/uL (ref 0.0–0.1)
EOS ABS: 0.2 10*3/uL (ref 0.0–0.7)
EOS PCT: 2.1 % (ref 0.0–5.0)
HCT: 46.8 % (ref 39.0–52.0)
HEMOGLOBIN: 15.4 g/dL (ref 13.0–17.0)
LYMPHS ABS: 1.5 10*3/uL (ref 0.7–4.0)
Lymphocytes Relative: 16.7 % (ref 12.0–46.0)
MCHC: 32.8 g/dL (ref 30.0–36.0)
MCV: 88.4 fl (ref 78.0–100.0)
MONO ABS: 0.5 10*3/uL (ref 0.1–1.0)
Monocytes Relative: 5 % (ref 3.0–12.0)
NEUTROS ABS: 6.9 10*3/uL (ref 1.4–7.7)
NEUTROS PCT: 75.8 % (ref 43.0–77.0)
PLATELETS: 211 10*3/uL (ref 150.0–400.0)
RBC: 5.29 Mil/uL (ref 4.22–5.81)
RDW: 13.4 % (ref 11.5–15.5)
WBC: 9.1 10*3/uL (ref 4.0–10.5)

## 2015-10-23 LAB — TSH: TSH: 2.77 u[IU]/mL (ref 0.35–4.50)

## 2015-10-23 LAB — URINALYSIS, ROUTINE W REFLEX MICROSCOPIC
BILIRUBIN URINE: NEGATIVE
Hgb urine dipstick: NEGATIVE
Ketones, ur: NEGATIVE
Leukocytes, UA: NEGATIVE
Nitrite: NEGATIVE
PH: 6.5 (ref 5.0–8.0)
SPECIFIC GRAVITY, URINE: 1.01 (ref 1.000–1.030)
TOTAL PROTEIN, URINE-UPE24: NEGATIVE
UROBILINOGEN UA: 0.2 (ref 0.0–1.0)
Urine Glucose: NEGATIVE

## 2015-10-23 LAB — PSA: PSA: 1.36 ng/mL (ref 0.10–4.00)

## 2015-10-23 NOTE — Progress Notes (Signed)
we discussed code status.  pt requests full code, but would not want to be started or maintained on artificial life-support measures if there was not a reasonable chance of recovery 

## 2015-10-23 NOTE — Progress Notes (Signed)
Subjective:    Patient ID: Bryan Armstrong, male    DOB: Mar 04, 1935, 81 y.o.   MRN: [id number]  HPI Pt is here for regular wellness examination, and is feeling pretty well in general, and says chronic med probs are stable, except as noted below Past Medical History  Diagnosis Date  . Hypertension   . COPD (chronic obstructive pulmonary disease) ([place])   . BRADYCARDIA 06/01/2007  . EMPHYSEMA 03/28/2008  . HYPERGLYCEMIA 06/01/2007  . HYPERLIPIDEMIA 06/01/2007  . HYPERTENSION 01/19/2009    No past surgical history on file.  Social History   Social History  . Marital Status: Married    Spouse Name: N/A  . Number of Children: N/A  . Years of Education: N/A   Occupational History  . Retired     Social History Main Topics  . Smoking status: Current Every Day Smoker -- 0.20 packs/day for 40 years  . Smokeless tobacco: Not on file     Comment: about 3 cigarettes per day  . Alcohol Use: No  . Drug Use: Not on file  . Sexual Activity: Not on file   Other Topics Concern  . Not on file   Social History Narrative    Current Outpatient Prescriptions on File Prior to Visit  Medication Sig Dispense Refill  . albuterol (PROAIR HFA) 108 (90 BASE) MCG/ACT inhaler Inhale 2 puffs into the lungs every 6 (six) hours as needed for shortness of breath. 18 g 6  . aspirin 81 MG tablet 1/2 tablet by mouth daily    . EPINEPHrine (EPIPEN 2-PAK) 0.3 mg/0.3 mL SOAJ injection Inject 0.3 mL (0.3 mg total) into the muscle once. 1 Device 1  . Fluticasone-Salmeterol (ADVAIR DISKUS) 250-50 MCG/DOSE AEPB inhale 1 dose by mouth twice a day 60 each 6  . triamterene-hydrochlorothiazide (MAXZIDE-25) 37.5-25 MG per tablet Take 0.5-1 tablet daily. (Patient not taking: Reported on 10/23/2015) 30 tablet 2   No current facility-administered medications on file prior to visit.    No Known Allergies  Family History  Problem Relation Age of Onset  . Cancer Mother     "Male" cancer  . Uterine cancer Mother   .  Heart disease Father   . Colon cancer Neg Hx   . Esophageal cancer Neg Hx   . Rectal cancer Neg Hx   . Stomach cancer Neg Hx     BP 164/106 mmHg  Pulse 84  Temp(Src) 98.3 F (36.8 C) (Oral)  Ht 5\' 11"  (1.803 m)  Wt 214 lb (97.07 kg)  BMI 29.86 kg/m2  SpO2 93%   Review of Systems  Constitutional: Negative for fever.  HENT: Negative for dental problem.   Eyes: Negative for photophobia.  Respiratory: Negative for shortness of breath.   Cardiovascular: Negative for chest pain.  Gastrointestinal: Negative for blood in stool.  Endocrine: Negative for cold intolerance.  Genitourinary: Negative for hematuria.  Skin: Negative for rash.  Allergic/Immunologic: Negative for environmental allergies.  Neurological: Negative for syncope and numbness.  Hematological: Does not bruise/bleed easily.  Psychiatric/Behavioral: Negative for sleep disturbance.       Objective:   Physical Exam VS: see vs page GEN: no distress HEAD: head: no deformity eyes: no periorbital swelling, no proptosis external nose and ears are normal mouth: no lesion seen NECK: supple, thyroid is not enlarged CHEST WALL: no deformity BREASTS:  No gynecomastia CV: reg rate and rhythm, no murmur ABD: abdomen is soft, nontender.  no hepatosplenomegaly.  not distended.  no hernia.   RECTAL:  normal external and internal exam.  heme neg. PROSTATE:  Normal size.  No nodule MUSCULOSKELETAL: muscle bulk and strength are grossly normal.  no obvious joint swelling.  gait is normal and steady.   EXTEMITIES: no deformity.  no ulcer on the feet.  feet are of normal color and temp.  no edema.  There is bilateral onychomycosis of the toenails.   PULSES: dorsalis pedis intact bilat.  no carotid bruit.   NEURO:  cn 2-12 grossly intact.   readily moves all 4's.  sensation is intact to touch on the feet.   SKIN:  Normal texture and temperature.  No rash or suspicious lesion is visible.   NODES:  None palpable at the neck.     PSYCH: alert, well-oriented.  Does not appear anxious nor depressed.     ecg is declined    Assessment & Plan:  Wellness visit today, with problems stable, except as noted.   SEPARATE EVALUATION FOLLOWS--EACH PROBLEM HERE IS NEW, NOT RESPONDING TO TREATMENT, OR POSES SIGNIFICANT RISK TO THE PATIENT'S HEALTH: Pt returns for f/u of HTN: she takes maxzide intermittently.  pt states he feels well in general.  He denies edema. HISTORY OF THE PRESENT ILLNESS: PAST MEDICAL HISTORY reviewed and up to date today. REVIEW OF SYSTEMS: Denies weight change PHYSICAL EXAMINATION: VITAL SIGNS:  See vs page GENERAL: no distress LUNGS:  Clear to auscultation LAB/XRAY RESULTS: Lab Results  Component Value Date   CREATININE 0.99 10/23/2015   BUN 21 10/23/2015   NA 138 10/23/2015   K 4.2 10/23/2015   CL 100 10/23/2015   CO2 31 10/23/2015  IMPRESSION: HTN: therapy limited by noncompliance.  i'll do the best i can. PLAN: See instruction page   Subjective:   Patient here for Medicare annual wellness visit and management of other chronic and acute problems.     Risk factors: advanced age    82 of Physicians Providing Medical Care to Patient:  See "snapshot"   Activities of Daily Living: In your present state of health, do you have any difficulty performing the following activities?:  Preparing food and eating?: No  Bathing yourself: No  Getting dressed: No  Using the toilet:No  Moving around from place to place: No  In the past year have you fallen or had a near fall?: No    Home Safety: Has smoke detector and wears seat belts. No firearms. No excess sun exposure.  Diet and Exercise  Current exercise habits: pt says good Dietary issues discussed: pt reports a healthy diet   Depression Screen  Q1: Over the past two weeks, have you felt down, depressed or hopeless?no  Q2: Over the past two weeks, have you felt little interest or pleasure in doing things? no   The following  portions of the patient's history were reviewed and updated as appropriate: allergies, current medications, past family history, past medical history, past social history, past surgical history and problem list.   Review of Systems  Denies hearing loss, and visual loss Objective:   Vision:  Advertising account executive, but does not recall name Hearing: grossly normal Body mass index:  See vs page Msk: pt easily and quickly performs "get-up-and-go" from a sitting position Cognitive Impairment Assessment: cognition, memory and judgment appear normal.  remembers 3/3 at 5 minutes.  excellent recall.  can easily read and write a sentence.  alert and oriented x 3   Assessment:   Medicare wellness utd on preventive parameters    Plan:   During  the course of the visit the patient was educated and counseled about appropriate screening and preventive services including:        Fall prevention   Diabetes screening  Nutrition counseling   Vaccines / LABS Zostavax / Pneumococcal Vaccine  today  PSA  Patient Instructions (the written plan) was given to the patient.

## 2015-10-23 NOTE — Patient Instructions (Addendum)
blood tests are requested for you today.  We'll let you know about the results. Please take the blood pressure pill every day, no matter what your blood pressure is. please consider these measures for your health:  minimize alcohol.  do not use tobacco products.  have a colonoscopy at least every 10 years from age 81.  keep firearms safely stored.  always use seat belts.  have working smoke alarms in your home.  see an eye doctor and dentist regularly.  never drive under the influence of alcohol or drugs (including prescription drugs).  those with fair skin should take precautions against the sun. it is critically important to prevent falling down (keep floor areas well-lit, dry, and free of loose objects.  If you have a cane, walker, or wheelchair, you should use it, even for short trips around the house.  Also, try not to rush) Please return in 1 year.

## 2015-10-24 ENCOUNTER — Telehealth: Payer: Self-pay | Admitting: Endocrinology

## 2015-10-24 ENCOUNTER — Other Ambulatory Visit: Payer: Self-pay

## 2015-10-24 MED ORDER — SIMVASTATIN 80 MG PO TABS: 80.0000 mg | ORAL_TABLET | Freq: Every day | ORAL | Status: DC

## 2015-10-24 NOTE — Telephone Encounter (Signed)
I contacted the pt and advised his blood tests from 10/23/2015 came back good and no medication changes were made.

## 2015-10-24 NOTE — Telephone Encounter (Signed)
Patient would like for Megan to please return his call    Thank you

## 2016-01-14 DIAGNOSIS — M1712 Unilateral primary osteoarthritis, left knee: Secondary | ICD-10-CM | POA: Diagnosis not present

## 2016-05-06 DIAGNOSIS — C44519 Basal cell carcinoma of skin of other part of trunk: Secondary | ICD-10-CM | POA: Diagnosis not present

## 2016-05-06 DIAGNOSIS — C44212 Basal cell carcinoma of skin of right ear and external auricular canal: Secondary | ICD-10-CM | POA: Diagnosis not present

## 2016-06-03 DIAGNOSIS — C44212 Basal cell carcinoma of skin of right ear and external auricular canal: Secondary | ICD-10-CM | POA: Diagnosis not present

## 2016-09-25 ENCOUNTER — Ambulatory Visit (INDEPENDENT_AMBULATORY_CARE_PROVIDER_SITE_OTHER): Payer: Medicare Other | Admitting: Pulmonary Disease

## 2016-09-25 ENCOUNTER — Encounter: Payer: Self-pay | Admitting: Pulmonary Disease

## 2016-09-25 VITALS — BP 104/74 | HR 61 | Ht 71.0 in | Wt 221.0 lb

## 2016-09-25 DIAGNOSIS — J449 Chronic obstructive pulmonary disease, unspecified: Secondary | ICD-10-CM

## 2016-09-25 MED ORDER — FLUTICASONE-SALMETEROL 250-50 MCG/DOSE IN AEPB: INHALATION_SPRAY | RESPIRATORY_TRACT | 3 refills | Status: DC

## 2016-09-25 NOTE — Progress Notes (Signed)
L[facility]    [id number]    [birth year]  Primary Care Physician:ELLISON, Hilliard Clark, MD  Referring Physician: Renato Shin, MD [place] [employer] [place], [place] [address]  Chief complaint:   Follow up for COPD GOLD A  HPI: [name] is a 81 year old with COPD GOLD A (MMRC score 1, no exacerbations, FEV1 89%). He is doing well with this current inhaler therapy. He is also on albuterol when necessary. However he does not need his rescue inhaler. He does not have any recent exacerbations or ER visits.  He feels well today with dyspnea on exertion at baseline. He denies any fevers, cough, wheezing, chest pain, palpitation. He quit smoking in September 2017.He used to smoke about half pack a day for about 40 years.  Outpatient Encounter Prescriptions as of 09/25/2016  Medication Sig  . aspirin 81 MG tablet 1/2 tablet by mouth daily  . EPINEPHrine (EPIPEN 2-PAK) 0.3 mg/0.3 mL SOAJ injection Inject 0.3 mL (0.3 mg total) into the muscle once.  . Fluticasone-Salmeterol (ADVAIR DISKUS) 250-50 MCG/DOSE AEPB inhale 1 dose by mouth twice a day  . simvastatin (ZOCOR) 80 MG tablet Take 1 tablet (80 mg total) by mouth at bedtime.  . triamterene-hydrochlorothiazide (MAXZIDE-25) 37.5-25 MG per tablet Take 0.5-1 tablet daily.  Marland Kitchen albuterol (PROAIR HFA) 108 (90 BASE) MCG/ACT inhaler Inhale 2 puffs into the lungs every 6 (six) hours as needed for shortness of breath.   No facility-administered encounter medications on file as of 09/25/2016.     Allergies as of 09/25/2016  . (No Known Allergies)    Past Medical History:  Diagnosis Date  . BRADYCARDIA 06/01/2007  . COPD (chronic obstructive pulmonary disease) ([place])   . EMPHYSEMA 03/28/2008  . HYPERGLYCEMIA 06/01/2007  . HYPERLIPIDEMIA 06/01/2007  . Hypertension   . HYPERTENSION 01/19/2009    History reviewed. No pertinent surgical history.  Family History  Problem Relation Age of Onset  . Cancer Mother     "Male" cancer  .  Uterine cancer Mother   . Heart disease Father   . Colon cancer Neg Hx   . Esophageal cancer Neg Hx   . Rectal cancer Neg Hx   . Stomach cancer Neg Hx     Social History   Social History  . Marital status: Married    Spouse name: N/A  . Number of children: N/A  . Years of education: N/A   Occupational History  . Retired     Social History Main Topics  . Smoking status: Former Smoker    Packs/day: 0.50    Years: 40.00    Quit date: 07/12/2016  . Smokeless tobacco: Never Used  . Alcohol use No  . Drug use: Unknown  . Sexual activity: Not on file   Other Topics Concern  . Not on file   Social History Narrative  . No narrative on file   Review of systems: Review of Systems  Constitutional: Negative for fever and chills.  HENT: Negative.   Eyes: Negative for blurred vision.  Respiratory: as per HPI  Cardiovascular: Negative for chest pain and palpitations.  Gastrointestinal: Negative for vomiting, diarrhea, blood per rectum. Genitourinary: Negative for dysuria, urgency, frequency and hematuria.  Musculoskeletal: Negative for myalgias, back pain and joint pain.  Skin: Negative for itching and rash.  Neurological: Negative for dizziness, tremors, focal weakness, seizures and loss of consciousness.  Endo/Heme/Allergies: Negative for environmental allergies.  Psychiatric/Behavioral: Negative for depression, suicidal ideas  and hallucinations.  All other systems reviewed and are negative.  Physical Exam: Blood pressure 104/74, pulse 61, height 5\' 11"  (1.803 m), weight 221 lb (100.2 kg), SpO2 99 %. Gen:      No acute distress HEENT:  EOMI, sclera anicteric Neck:     No masses; no thyromegaly Lungs:    Clear to auscultation bilaterally; normal respiratory effort CV:         Regular rate and rhythm; no murmurs Abd:      + bowel sounds; soft, non-tender; no palpable masses, no distension Ext:    No edema; adequate peripheral perfusion Skin:      Warm and dry; no  rash Neuro: alert and oriented x 3 Psych: normal mood and affect  Data Reviewed: PFTs [01/06/05] FVC 4.50 [96%] FEV1 2.85 [89] F/F 63 TLC 7.76 [111%] DLCO 132%. Mild airway obstruction, normal lung volumes, no bronchial dilator response.  Chest x-ray [08/09/11] No acute cardiopulmonary abnormalities. Images reviewed  Assessment:  COPD GOLD A Stable on advair. He hardly needs to use his rescue inhaler.  He has seasonal allergies for which he is taking allegra OTC.  Plan/Recommendations: Continue Advair  Marshell Garfinkel MD [facility] Pager [phone] 09/25/2016, 2:46 PM  CC: Renato Shin, MD

## 2016-09-25 NOTE — Patient Instructions (Signed)
Continue using her inhalers prescribed.  Follow-up in one year.

## 2016-10-18 ENCOUNTER — Other Ambulatory Visit: Payer: Self-pay | Admitting: Endocrinology

## 2016-10-18 NOTE — Telephone Encounter (Signed)
Please refill x 1 Ov is due  

## 2016-10-19 ENCOUNTER — Telehealth: Payer: Self-pay | Admitting: Endocrinology

## 2016-10-19 NOTE — Progress Notes (Deleted)
   Subjective:    Patient ID: Bryan Armstrong, male    DOB: 1935-02-17, 82 y.o.   MRN: [id number]  HPI    Review of Systems     Objective:   Physical Exam        Assessment & Plan:

## 2016-10-19 NOTE — Telephone Encounter (Signed)
please call patient: This is 1 day early for your annual visit, so ins won't pay.  Please delay it until next avail appt.

## 2016-10-20 NOTE — Telephone Encounter (Signed)
I contacted the patient and advised of message. Patient voiced understanding and stated the next appointment he could come in for would be 11/25/2016 at 3 pm. Patient scheduled for this day and time.

## 2016-10-21 ENCOUNTER — Ambulatory Visit: Payer: Medicare Other | Admitting: Endocrinology

## 2016-10-27 ENCOUNTER — Encounter: Payer: Self-pay | Admitting: Gastroenterology

## 2016-11-25 ENCOUNTER — Ambulatory Visit: Payer: Medicare Other | Admitting: Endocrinology

## 2016-12-11 ENCOUNTER — Ambulatory Visit (INDEPENDENT_AMBULATORY_CARE_PROVIDER_SITE_OTHER): Payer: Medicare Other | Admitting: Endocrinology

## 2016-12-11 ENCOUNTER — Encounter: Payer: Self-pay | Admitting: Gastroenterology

## 2016-12-11 ENCOUNTER — Encounter: Payer: Self-pay | Admitting: Endocrinology

## 2016-12-11 VITALS — BP 122/72 | HR 67 | Ht 71.0 in | Wt 217.0 lb

## 2016-12-11 DIAGNOSIS — I10 Essential (primary) hypertension: Secondary | ICD-10-CM | POA: Diagnosis not present

## 2016-12-11 DIAGNOSIS — E785 Hyperlipidemia, unspecified: Secondary | ICD-10-CM

## 2016-12-11 DIAGNOSIS — R7309 Other abnormal glucose: Secondary | ICD-10-CM

## 2016-12-11 DIAGNOSIS — Z0000 Encounter for general adult medical examination without abnormal findings: Secondary | ICD-10-CM

## 2016-12-11 DIAGNOSIS — Z125 Encounter for screening for malignant neoplasm of prostate: Secondary | ICD-10-CM | POA: Diagnosis not present

## 2016-12-11 LAB — URINALYSIS, ROUTINE W REFLEX MICROSCOPIC
BILIRUBIN URINE: NEGATIVE
HGB URINE DIPSTICK: NEGATIVE
Ketones, ur: NEGATIVE
LEUKOCYTES UA: NEGATIVE
NITRITE: NEGATIVE
RBC / HPF: NONE SEEN (ref 0–?)
Specific Gravity, Urine: <=1.005 — AB (ref 1.000–1.030)
Total Protein, Urine: NEGATIVE
Urine Glucose: NEGATIVE
Urobilinogen, UA: 0.2 (ref 0.0–1.0)
pH: 6 (ref 5.0–8.0)

## 2016-12-11 LAB — CBC WITH DIFFERENTIAL/PLATELET
BASOS ABS: 0.1 10*3/uL (ref 0.0–0.1)
Basophils Relative: 0.9 % (ref 0.0–3.0)
EOS PCT: 6.9 % — AB (ref 0.0–5.0)
Eosinophils Absolute: 0.6 10*3/uL (ref 0.0–0.7)
HEMATOCRIT: 41.9 % (ref 39.0–52.0)
HEMOGLOBIN: 14.1 g/dL (ref 13.0–17.0)
LYMPHS ABS: 1.8 10*3/uL (ref 0.7–4.0)
LYMPHS PCT: 21.6 % (ref 12.0–46.0)
MCHC: 33.6 g/dL (ref 30.0–36.0)
MCV: 90.5 fl (ref 78.0–100.0)
MONOS PCT: 6.1 % (ref 3.0–12.0)
Monocytes Absolute: 0.5 10*3/uL (ref 0.1–1.0)
NEUTROS PCT: 64.5 % (ref 43.0–77.0)
Neutro Abs: 5.3 10*3/uL (ref 1.4–7.7)
Platelets: 222 10*3/uL (ref 150.0–400.0)
RBC: 4.63 Mil/uL (ref 4.22–5.81)
RDW: 13.6 % (ref 11.5–15.5)
WBC: 8.1 10*3/uL (ref 4.0–10.5)

## 2016-12-11 LAB — HEPATIC FUNCTION PANEL
ALBUMIN: 4.3 g/dL (ref 3.5–5.2)
ALK PHOS: 47 U/L (ref 39–117)
ALT: 15 U/L (ref 0–53)
AST: 15 U/L (ref 0–37)
Bilirubin, Direct: 0.1 mg/dL (ref 0.0–0.3)
Total Bilirubin: 0.6 mg/dL (ref 0.2–1.2)
Total Protein: 7 g/dL (ref 6.0–8.3)

## 2016-12-11 LAB — BASIC METABOLIC PANEL
BUN: 27 mg/dL — ABNORMAL HIGH (ref 6–23)
CALCIUM: 9 mg/dL (ref 8.4–10.5)
CHLORIDE: 104 meq/L (ref 96–112)
CO2: 30 meq/L (ref 19–32)
Creatinine, Ser: 1.18 mg/dL (ref 0.40–1.50)
GFR: 63.13 mL/min (ref >60.00)
Glucose, Bld: 90 mg/dL (ref 70–99)
POTASSIUM: 4.1 meq/L (ref 3.5–5.1)
SODIUM: 137 meq/L (ref 135–145)

## 2016-12-11 LAB — LIPID PANEL
CHOLESTEROL: 175 mg/dL (ref 0–200)
HDL: 36.7 mg/dL — ABNORMAL LOW (ref >39.00)
LDL Cholesterol: 103 mg/dL — ABNORMAL HIGH (ref 0–99)
NonHDL: 138.2
Total CHOL/HDL Ratio: 5
Triglycerides: 177 mg/dL — ABNORMAL HIGH (ref 0.0–149.0)
VLDL: 35.4 mg/dL (ref 0.0–40.0)

## 2016-12-11 LAB — TSH: TSH: 2.26 u[IU]/mL (ref 0.35–4.50)

## 2016-12-11 LAB — PSA: PSA: 1.4 ng/mL (ref 0.10–4.00)

## 2016-12-11 LAB — HEMOGLOBIN A1C: Hgb A1c MFr Bld: 5.9 % (ref 4.6–6.5)

## 2016-12-11 NOTE — Patient Instructions (Addendum)
good diet and exercise significantly improve your health.  please let me know if you wish to be referred to a dietician.  high blood sugar is very risky to your health.  you should see an eye doctor and dentist every year.  It is very important to get all recommended vaccinations.  Please consider these measures for your health:  minimize alcohol.  Do not use tobacco products.  Have a colonoscopy at least every 10 years from age 80.  Keep firearms safely stored.  Always use seat belts.  have working smoke alarms in your home.  See an eye doctor and dentist regularly.  Never drive under the influence of alcohol or drugs (including prescription drugs).  Those with fair skin should take precautions against the sun, and should carefully examine their skin once per month, for any new or changed moles. It is critically important to prevent falling down (keep floor areas well-lit, dry, and free of loose objects.  If you have a cane, walker, or wheelchair, you should use it, even for short trips around the house.  Wear flat-soled shoes.  Also, try not to rush).   Please return in 1 year.

## 2016-12-11 NOTE — Progress Notes (Signed)
Subjective:    Patient ID: Bryan Armstrong, male    DOB: 08/04/1935, 82 y.o.   MRN: [id number]  HPI Pt is here for regular wellness examination, and is feeling pretty well in general, and says chronic med probs are stable, except as noted below Past Medical History:  Diagnosis Date  . BRADYCARDIA 06/01/2007  . COPD (chronic obstructive pulmonary disease) ([place])   . EMPHYSEMA 03/28/2008  . HYPERGLYCEMIA 06/01/2007  . HYPERLIPIDEMIA 06/01/2007  . Hypertension   . HYPERTENSION 01/19/2009    No past surgical history on file.  Social History   Social History  . Marital status: Married    Spouse name: N/A  . Number of children: N/A  . Years of education: N/A   Occupational History  . Retired     Social History Main Topics  . Smoking status: Former Smoker    Packs/day: 0.50    Years: 40.00    Quit date: 07/12/2016  . Smokeless tobacco: Never Used  . Alcohol use No  . Drug use: Unknown  . Sexual activity: Not on file   Other Topics Concern  . Not on file   Social History Narrative  . No narrative on file    Current Outpatient Prescriptions on File Prior to Visit  Medication Sig Dispense Refill  . aspirin 81 MG tablet 1/2 tablet by mouth daily    . EPINEPHrine (EPIPEN 2-PAK) 0.3 mg/0.3 mL SOAJ injection Inject 0.3 mL (0.3 mg total) into the muscle once. 1 Device 1  . Fluticasone-Salmeterol (ADVAIR DISKUS) 250-50 MCG/DOSE AEPB inhale 1 dose by mouth twice a day 3 each 3  . triamterene-hydrochlorothiazide (MAXZIDE-25) 37.5-25 MG per tablet Take 0.5-1 tablet daily. 30 tablet 2  . albuterol (PROAIR HFA) 108 (90 BASE) MCG/ACT inhaler Inhale 2 puffs into the lungs every 6 (six) hours as needed for shortness of breath. 18 g 6   No current facility-administered medications on file prior to visit.     No Known Allergies  Family History  Problem Relation Age of Onset  . Cancer Mother     "Male" cancer  . Uterine cancer Mother   . Heart disease Father   . Colon cancer Neg Hx    . Esophageal cancer Neg Hx   . Rectal cancer Neg Hx   . Stomach cancer Neg Hx     BP 122/72   Pulse 67   Ht 5\' 11"  (1.803 m)   Wt 217 lb (98.4 kg)   SpO2 99%   BMI 30.27 kg/m     Review of Systems Constitutional: Negative for fever.  HENT: Negative for dental problem.   Eyes: Negative for photophobia.  Respiratory: Negative for shortness of breath.   Gastrointestinal: Negative for blood in stool.  Endocrine: Negative for cold intolerance.  Genitourinary: Negative for hematuria.  Skin: Negative for rash.  Allergic/Immunologic: posittive for seasonal environmental allergies.  Neurological: Negative for syncope and numbness.  Hematological: Does not bruise/bleed easily.  Psychiatric/Behavioral: Negative for sleep disturbance.     Objective:   Physical Exam VS: see vs page GEN: no distress HEAD: head: no deformity eyes: no periorbital swelling, no proptosis external nose and ears are normal mouth: no lesion seen NECK: supple, thyroid is not enlarged CHEST WALL: no deformity BREASTS:  No gynecomastia ABD: abdomen is soft, nontender.  no hepatosplenomegaly.  not distended.  no hernia.   RECTAL: normal external and internal exam.  heme neg. PROSTATE:  Normal size.  No nodule MUSCULOSKELETAL: muscle bulk and strength  are grossly normal.  no obvious joint swelling.  gait is normal and steady.   EXTEMITIES: no deformity.  no ulcer on the feet, but the skin is dry.  feet are of normal color and temp.  no edema.  VV's of the legs (R>L).  There is bilateral onychomycosis of the toenails.   PULSES: dorsalis pedis intact bilat.  no carotid bruit.   NEURO:  cn 2-12 grossly intact.   readily moves all 4's.  sensation is intact to touch on the feet.   SKIN:  Normal texture and temperature.  No rash or suspicious lesion is visible.   NODES:  None palpable at the neck.   PSYCH: alert, well-oriented.  Does not appear anxious nor depressed.   ecg is refused.       Assessment &  Plan:  Wellness visit today, with problems stable, except as noted.  Subjective:   Patient here for Medicare annual wellness visit and management of other chronic and acute problems.     Risk factors: advanced age    13 of Physicians Providing Medical Care to Patient:  See "snapshot"   Activities of Daily Living: In your present state of health, do you have any difficulty performing the following activities (lives with wife)?:  Preparing food and eating?: No  Bathing yourself: No  Getting dressed: No  Using the toilet:No  Moving around from place to place: No  In the past year have you fallen or had a near fall?: No    Home Safety: Has smoke detector and wears seat belts. No firearms. No excess sun exposure.   Diet and Exercise  Current exercise habits: pt says good, as limited by COPD Dietary issues discussed: pt reports a healthy diet   Depression Screen  Q1: Over the past two weeks, have you felt down, depressed or hopeless? no  Q2: Over the past two weeks, have you felt little interest or pleasure in doing things? no   The following portions of the patient's history were reviewed and updated as appropriate: allergies, current medications, past family history, past medical history, past social history, past surgical history and problem list.   Review of Systems  Denies hearing loss, and visual loss Objective:   Vision:  Advertising account executive, but does not recall name Hearing: grossly normal Body mass index:  See vs page Msk: pt easily and quickly performs "get-up-and-go" from a sitting position.   Cognitive Impairment Assessment: cognition, memory and judgment appear normal.  remembers 3/3 at 5 minutes.  excellent recall.  can easily read and write a sentence.  alert and oriented x 3   Assessment:   Medicare wellness utd on preventive parameters    Plan:   During the course of the visit the patient was educated and counseled about appropriate screening and  preventive services including:        Fall prevention    Diabetes screening  Nutrition counseling   Vaccines / LABS Zostavax / Pneumococcal Vaccine today.  PSA.   Patient Instructions (the written plan) was given to the patient.    SEPARATE EVALUATION FOLLOWS--EACH PROBLEM HERE IS NEW, NOT RESPONDING TO TREATMENT, OR POSES SIGNIFICANT RISK TO THE PATIENT'S HEALTH: HISTORY OF THE PRESENT ILLNESS: Pt returns for f/u of dyslipidemia.  He has been taking less than the prescribed dosage of zocor, due to myalgias PAST MEDICAL HISTORY Past Medical History:  Diagnosis Date  . BRADYCARDIA 06/01/2007  . COPD (chronic obstructive pulmonary disease) ([place])   . EMPHYSEMA 03/28/2008  .  HYPERGLYCEMIA 06/01/2007  . HYPERLIPIDEMIA 06/01/2007  . Hypertension   . HYPERTENSION 01/19/2009    No past surgical history on file.  Social History   Social History  . Marital status: Married    Spouse name: N/A  . Number of children: N/A  . Years of education: N/A   Occupational History  . Retired     Social History Main Topics  . Smoking status: Former Smoker    Packs/day: 0.50    Years: 40.00    Quit date: 07/12/2016  . Smokeless tobacco: Never Used  . Alcohol use No  . Drug use: Unknown  . Sexual activity: Not on file   Other Topics Concern  . Not on file   Social History Narrative  . No narrative on file    Current Outpatient Prescriptions on File Prior to Visit  Medication Sig Dispense Refill  . aspirin 81 MG tablet 1/2 tablet by mouth daily    . EPINEPHrine (EPIPEN 2-PAK) 0.3 mg/0.3 mL SOAJ injection Inject 0.3 mL (0.3 mg total) into the muscle once. 1 Device 1  . Fluticasone-Salmeterol (ADVAIR DISKUS) 250-50 MCG/DOSE AEPB inhale 1 dose by mouth twice a day 3 each 3  . triamterene-hydrochlorothiazide (MAXZIDE-25) 37.5-25 MG per tablet Take 0.5-1 tablet daily. 30 tablet 2  . albuterol (PROAIR HFA) 108 (90 BASE) MCG/ACT inhaler Inhale 2 puffs into the lungs every 6 (six) hours as needed  for shortness of breath. 18 g 6   No current facility-administered medications on file prior to visit.     No Known Allergies  Family History  Problem Relation Age of Onset  . Cancer Mother     "Male" cancer  . Uterine cancer Mother   . Heart disease Father   . Colon cancer Neg Hx   . Esophageal cancer Neg Hx   . Rectal cancer Neg Hx   . Stomach cancer Neg Hx     BP 122/72   Pulse 67   Ht 5\' 11"  (1.803 m)   Wt 217 lb (98.4 kg)   SpO2 99%   BMI 30.27 kg/m   REVIEW OF SYSTEMS: Denies chest pain PHYSICAL EXAMINATION: VITAL SIGNS:  See vs page GENERAL: no distress HEART:  Regular rate and rhythm without murmurs noted. Normal S1,S2.   LAB/XRAY RESULTS: Lab Results  Component Value Date   CHOL 175 12/11/2016   HDL 36.70 (L) 12/11/2016   LDLCALC 103 (H) 12/11/2016   TRIG 177.0 (H) 12/11/2016   CHOLHDL 5 12/11/2016   IMPRESSION: Dyslipidemia, therapy is limited by perceived drug intolerance PLAN:  I have sent a prescription to your pharmacy, to change to crestor

## 2016-12-11 NOTE — Progress Notes (Signed)
we discussed code status.  pt requests full code, but would not want to be started or maintained on artificial life-support measures if there was not a reasonable chance of recovery 

## 2016-12-12 MED ORDER — ROSUVASTATIN CALCIUM 10 MG PO TABS: 10.0000 mg | ORAL_TABLET | Freq: Every day | ORAL | 3 refills | Status: DC

## 2017-01-27 ENCOUNTER — Ambulatory Visit (INDEPENDENT_AMBULATORY_CARE_PROVIDER_SITE_OTHER): Payer: Medicare Other | Admitting: Gastroenterology

## 2017-01-27 ENCOUNTER — Encounter: Payer: Self-pay | Admitting: Gastroenterology

## 2017-01-27 VITALS — BP 110/70 | HR 68 | Ht 71.0 in | Wt 218.0 lb

## 2017-01-27 DIAGNOSIS — Z86010 Personal history of colonic polyps: Secondary | ICD-10-CM

## 2017-01-27 NOTE — Patient Instructions (Signed)
No further colon cancer screening or polyp surveillance tests. If any new GI symptoms, please call.

## 2017-01-27 NOTE — Progress Notes (Signed)
HPI: This is a  very pleasant 82 year old man  whom I last saw about 5 years ago the time of a screening colonoscopy. See those results summarized below.  Chief complaint is personal history of adenomatous colon polyps  Colonoscopy March 2013 done for routine risk screening. 3 small sessile polyps were removed, moderate left-sided diverticulosis was noted.  2 of the polyps were adenomas and I recommended repeat colonoscopy at five-year interval. Given his age, 21 I recommended he come see me in the office before committing to any type of surveillance testing.  He has had no changes in his bowels, no overt GI bleeding, no significant abdominal pains. He is really not concerned about his GI tract.  No fevers or chills, no weight loss.  Review of systems: Pertinent positive and negative review of systems were noted in the above HPI section. Complete review of systems was performed and was otherwise normal.   Past Medical History:  Diagnosis Date  . BRADYCARDIA 06/01/2007  . COPD (chronic obstructive pulmonary disease) ([place])   . EMPHYSEMA 03/28/2008  . HYPERGLYCEMIA 06/01/2007  . HYPERLIPIDEMIA 06/01/2007  . Hypertension   . HYPERTENSION 01/19/2009    History reviewed. No pertinent surgical history.  Current Outpatient Prescriptions  Medication Sig Dispense Refill  . aspirin 81 MG tablet 1/2 tablet by mouth daily    . EPINEPHrine (EPIPEN 2-PAK) 0.3 mg/0.3 mL SOAJ injection Inject 0.3 mL (0.3 mg total) into the muscle once. 1 Device 1  . Fluticasone-Salmeterol (ADVAIR DISKUS) 250-50 MCG/DOSE AEPB inhale 1 dose by mouth twice a day 3 each 3  . rosuvastatin (CRESTOR) 10 MG tablet Take 1 tablet (10 mg total) by mouth daily. 90 tablet 3  . triamterene-hydrochlorothiazide (MAXZIDE-25) 37.5-25 MG per tablet Take 0.5-1 tablet daily. 30 tablet 2   No current facility-administered medications for this visit.     Allergies as of 01/27/2017  . (No Known Allergies)    Family History  Problem  Relation Age of Onset  . Cancer Mother     "Male" cancer  . Uterine cancer Mother   . Heart disease Father   . Colon cancer Neg Hx   . Esophageal cancer Neg Hx   . Rectal cancer Neg Hx   . Stomach cancer Neg Hx     Social History   Social History  . Marital status: Married    Spouse name: N/A  . Number of children: N/A  . Years of education: N/A   Occupational History  . Retired     Social History Main Topics  . Smoking status: Former Smoker    Packs/day: 0.50    Years: 40.00    Quit date: 07/12/2016  . Smokeless tobacco: Never Used  . Alcohol use No  . Drug use: No  . Sexual activity: Not on file   Other Topics Concern  . Not on file   Social History Narrative  . No narrative on file     Physical Exam: BP 110/70   Pulse 68   Ht 5\' 11"  (1.803 m)   Wt 218 lb (98.9 kg)   BMI 30.40 kg/m  Constitutional: generally well-appearing Psychiatric: alert and oriented x3 Eyes: extraocular movements intact Mouth: oral pharynx moist, no lesions Neck: supple no lymphadenopathy Cardiovascular: heart regular rate and rhythm Lungs: clear to auscultation bilaterally Abdomen: soft, nontender, nondistended, no obvious ascites, no peritoneal signs, normal bowel sounds Extremities: no lower extremity edema bilaterally Skin: no lesions on visible extremities   Assessment and plan: 82 y.o. male  with  Personal history of adenomatous colon polyps  He is 82 years old and we have very nice discussion about the nature of colon cancer screening cough, colon polyp surveillance. He understands that it is generally recommended colon cancer screening in colon polyp surveillance tests and around the age of 12 or 79. He is very healthy 82 year old man but since he is having 0 GI symptoms I recommended that we forego any further testing for him. He completely agrees with this and he knows that if he has any new concerning GI issues he will call.    Please see the "Patient Instructions"  section for addition details about the plan.   Owens Loffler, MD [facility] 01/27/2017, 3:06 PM  Cc: Renato Shin, MD

## 2017-03-10 ENCOUNTER — Other Ambulatory Visit: Payer: Self-pay | Admitting: *Deleted

## 2017-03-10 DIAGNOSIS — I83811 Varicose veins of right lower extremities with pain: Secondary | ICD-10-CM

## 2017-03-12 ENCOUNTER — Ambulatory Visit (INDEPENDENT_AMBULATORY_CARE_PROVIDER_SITE_OTHER): Payer: Medicare Other | Admitting: Vascular Surgery

## 2017-03-12 ENCOUNTER — Ambulatory Visit (HOSPITAL_COMMUNITY)
Admission: RE | Admit: 2017-03-12 | Discharge: 2017-03-12 | Disposition: A | Discharge status: Home / Self Care | Payer: Medicare Other | Source: Ambulatory Visit | Attending: Vascular Surgery | Admitting: Vascular Surgery

## 2017-03-12 ENCOUNTER — Encounter: Payer: Self-pay | Admitting: Vascular Surgery

## 2017-03-12 VITALS — BP 125/78 | HR 59 | Temp 97.6°F | Resp 16 | Ht 72.0 in | Wt 216.0 lb

## 2017-03-12 DIAGNOSIS — I70219 Atherosclerosis of native arteries of extremities with intermittent claudication, unspecified extremity: Secondary | ICD-10-CM | POA: Diagnosis not present

## 2017-03-12 DIAGNOSIS — I872 Venous insufficiency (chronic) (peripheral): Secondary | ICD-10-CM

## 2017-03-12 DIAGNOSIS — I83811 Varicose veins of right lower extremities with pain: Secondary | ICD-10-CM

## 2017-03-12 NOTE — Progress Notes (Signed)
Patient name: Bryan Armstrong MRN: [id number] DOB: 04/01/1935 Sex: male   REASON FOR CONSULT:    Right calf pain with varicose veins. Self-referral.  HPI:   Bryan Armstrong is a 82 y.o. male, who has been having some calf pain bilaterally and has significant varicose veins. He was self-referred for evaluation of venous disease. He describes pain in both calves which is brought on by ambulation and relieved with rest. His symptoms are more significant on the right side. His symptoms began 4-6 months ago and have been gradually progressive. He denies any significant thigh or hip claudication. He denies any history of rest pain or nonhealing ulcers. He does not experience symptoms when he is simply standing. He is unaware of any previous history of DVT or phlebitis.  His risk factors for peripheral vascular disease include hypertension, hyperlipidemia, and tobacco use. He denies any history of diabetes or family history of premature cardiovascular disease.  Past Medical History:  Diagnosis Date  . BRADYCARDIA 06/01/2007  . COPD (chronic obstructive pulmonary disease) ([place])   . EMPHYSEMA 03/28/2008  . HYPERGLYCEMIA 06/01/2007  . HYPERLIPIDEMIA 06/01/2007  . Hypertension   . HYPERTENSION 01/19/2009    Family History  Problem Relation Age of Onset  . Cancer Mother        "Male" cancer  . Uterine cancer Mother   . Heart disease Father   . Colon cancer Neg Hx   . Esophageal cancer Neg Hx   . Rectal cancer Neg Hx   . Stomach cancer Neg Hx     SOCIAL HISTORY: He quit smoking 6 months ago. He had smoked for many years about half a pack per day. Social History   Social History  . Marital status: Married    Spouse name: N/A  . Number of children: N/A  . Years of education: N/A   Occupational History  . Retired     Social History Main Topics  . Smoking status: Former Smoker    Packs/day: 0.50    Years: 40.00    Quit date: 07/12/2016  . Smokeless tobacco: Never Used  . Alcohol  use No  . Drug use: No  . Sexual activity: Not on file   Other Topics Concern  . Not on file   Social History Narrative  . No narrative on file    No Known Allergies  Current Outpatient Prescriptions  Medication Sig Dispense Refill  . aspirin 81 MG tablet 1/2 tablet by mouth daily    . EPINEPHrine (EPIPEN 2-PAK) 0.3 mg/0.3 mL SOAJ injection Inject 0.3 mL (0.3 mg total) into the muscle once. 1 Device 1  . Fluticasone-Salmeterol (ADVAIR DISKUS) 250-50 MCG/DOSE AEPB inhale 1 dose by mouth twice a day 3 each 3  . rosuvastatin (CRESTOR) 10 MG tablet Take 1 tablet (10 mg total) by mouth daily. 90 tablet 3  . triamterene-hydrochlorothiazide (MAXZIDE-25) 37.5-25 MG per tablet Take 0.5-1 tablet daily. 30 tablet 2   No current facility-administered medications for this visit.     REVIEW OF SYSTEMS:  [X]  denotes positive finding, [ ]  denotes negative finding Cardiac  Comments:  Chest pain or chest pressure:    Shortness of breath upon exertion:    Short of breath when lying flat:    Irregular heart rhythm:        Vascular    Pain in calf, thigh, or hip brought on by ambulation: X Bilateral calves   Pain in feet at night that wakes you up from your  sleep:     Blood clot in your veins:    Leg swelling:         Pulmonary    Oxygen at home:    Productive cough:     Wheezing:         Neurologic    Sudden weakness in arms or legs:     Sudden numbness in arms or legs:     Sudden onset of difficulty speaking or slurred speech:    Temporary loss of vision in one eye:     Problems with dizziness:         Gastrointestinal    Blood in stool:     Vomited blood:         Genitourinary    Burning when urinating:     Blood in urine:        Psychiatric    Major depression:         Hematologic    Bleeding problems:    Problems with blood clotting too easily:        Skin    Rashes or ulcers:        Constitutional    Fever or chills:     PHYSICAL EXAM:   Vitals:   03/12/17  1501  BP: 125/78  Pulse: (!) 59  Resp: 16  Temp: 97.6 F (36.4 C)  SpO2: 97%  Weight: 216 lb (98 kg)  Height: 6' (1.829 m)    GENERAL: The patient is a well-nourished male, in no acute distress. The vital signs are documented above. CARDIAC: There is a regular rate and rhythm.  VASCULAR: I do not detect carotid bruits. He has palpable femoral pulses. I cannot palpate popliteal or pedal pulses. He has monophasic Doppler signals in both feet in the dorsalis pedis and posterior tibial positions. He has no significant lower extremity swelling. He has significant truncal varicosities bilaterally but more significantly on the right side. PULMONARY: There is good air exchange bilaterally without wheezing or rales. ABDOMEN: Soft and non-tender with normal pitched bowel sounds. I cannot palpate an abdominal aortic aneurysm. MUSCULOSKELETAL: There are no major deformities or cyanosis. NEUROLOGIC: No focal weakness or paresthesias are detected. SKIN: There are no ulcers or rashes noted. PSYCHIATRIC: The patient has a normal affect.  DATA:    RIGHT LOWER EXTREMITY VENOUS DUPLEX: I have independently interpreted his right lower extremity venous duplex scan. On the right side, there is no evidence of DVT or superficial thrombophlebitis. He does have reflux in the deep system involving the common femoral vein and femoral vein. He has reflux in the superficial system involving the right saphenofemoral junction and right great saphenous vein. In addition he has some reflux in the short saphenous vein on the right.  MEDICAL ISSUES:   PERIPHERAL VASCULAR DISEASE WITH CLAUDICATION: Based on his history, I think that his symptoms in both calves are related to peripheral vascular disease and not related to his chronic venous insufficiency. He does not experience symptoms when he is simply standing. He has evidence of infrainguinal arterial occlusive disease on exam. We have discussed the importance of  staying off of tobacco. He quit 6 months ago. I have encouraged him to get on a structured walking program. We also discussed the importance of nutrition. If his symptoms progress or become disabling and certainly we can consider arteriography. I have discussed the indications for this and the potential complications. I've ordered follow up ABIs in 6 months and I'll see him back at that  time. He knows to call sooner if he has problems.  CHRONIC VENOUS INSUFFICIENCY: Based on his exam and duplex study, he does have significant chronic venous insufficiency. We have discussed the importance of intermittent leg elevation in the proper positioning for this. Given his peripheral vascular disease I would not recommend aggressive compression therapy. I have encouraged him to stay as active as possible and try to avoid prolonged sitting and standing.  Deitra Mayo [facility] [phone]

## 2017-03-20 NOTE — Addendum Note (Signed)
Addended by: Lianne Cure A on: 03/20/2017 02:01 PM   Modules accepted: Orders

## 2017-08-14 ENCOUNTER — Telehealth: Payer: Self-pay | Admitting: Pulmonary Disease

## 2017-08-14 NOTE — Telephone Encounter (Signed)
Pt is wanting to pick up a sample of the advair 250  At his appt with PM on 12/14.  He will get a new rx for this medication at this appt but will not fill it until jan 2019.  Will forward to MB to follow up on at pts OV

## 2017-08-25 DIAGNOSIS — M1711 Unilateral primary osteoarthritis, right knee: Secondary | ICD-10-CM | POA: Diagnosis not present

## 2017-09-15 ENCOUNTER — Ambulatory Visit: Payer: Medicare Other | Admitting: Pulmonary Disease

## 2017-09-15 ENCOUNTER — Encounter: Payer: Self-pay | Admitting: Pulmonary Disease

## 2017-09-15 VITALS — BP 120/70 | HR 78 | Ht 71.0 in | Wt 213.6 lb

## 2017-09-15 DIAGNOSIS — J449 Chronic obstructive pulmonary disease, unspecified: Secondary | ICD-10-CM

## 2017-09-15 MED ORDER — FLUTICASONE FUROATE-VILANTEROL 200-25 MCG/INH IN AEPB: 1.0000 | INHALATION_SPRAY | Freq: Every day | RESPIRATORY_TRACT | 0 refills | Status: AC

## 2017-09-15 MED ORDER — FLUTICASONE-SALMETEROL 250-50 MCG/DOSE IN AEPB: INHALATION_SPRAY | RESPIRATORY_TRACT | 6 refills | Status: DC

## 2017-09-15 MED ORDER — ALBUTEROL SULFATE HFA 108 (90 BASE) MCG/ACT IN AERS: 2.0000 | INHALATION_SPRAY | Freq: Four times a day (QID) | RESPIRATORY_TRACT | 6 refills | Status: DC | PRN

## 2017-09-15 MED ORDER — FLUTICASONE-SALMETEROL 250-50 MCG/DOSE IN AEPB: INHALATION_SPRAY | RESPIRATORY_TRACT | 3 refills | Status: DC

## 2017-09-15 NOTE — Patient Instructions (Signed)
We will give samples of a new inhaler.  Please use this for the next 1 month. If you like this better then give [place] a call and will call in a prescription for this. Follow-up in 1 year.

## 2017-09-15 NOTE — Progress Notes (Signed)
BERNON ARVISO    [id number]    April 03, 1935  Primary Care Physician:Ellison, Hilliard Clark, MD  Referring Physician: Renato Shin, MD [address] [place], [place] [address]  Chief complaint:   Follow up for COPD GOLD A  HPI: [name] is a 82 year old with COPD GOLD A (CAT score 7, no exacerbations, FEV1 89%). He is doing well with this current inhaler therapy. He is also on albuterol when necessary. However he does not need his rescue inhaler. He does not have any recent exacerbations or ER visits.  He feels well today with dyspnea on exertion at baseline. He denies any fevers, cough, wheezing, chest pain, palpitation. He quit smoking in September 2017.He used to smoke about half pack a day for about 40 years.  Interim History: He continues to do well.  He had reduced his Advair dose as he is in the donut hole.  He is taking it as once a day and notes slight worsening of dyspnea.  He is waiting to get back into coverage at the beginning of the year before going back to twice a day.  Outpatient Encounter Medications as of 09/15/2017  Medication Sig  . aspirin 81 MG tablet 1/2 tablet by mouth daily  . EPINEPHrine (EPIPEN 2-PAK) 0.3 mg/0.3 mL SOAJ injection Inject 0.3 mL (0.3 mg total) into the muscle once.  . Fluticasone-Salmeterol (ADVAIR DISKUS) 250-50 MCG/DOSE AEPB inhale 1 dose by mouth twice a day  . rosuvastatin (CRESTOR) 10 MG tablet Take 1 tablet (10 mg total) by mouth daily.  Marland Kitchen triamterene-hydrochlorothiazide (MAXZIDE-25) 37.5-25 MG per tablet Take 0.5-1 tablet daily.   No facility-administered encounter medications on file as of 09/15/2017.     Allergies as of 09/15/2017  . (No Known Allergies)    Past Medical History:  Diagnosis Date  . BRADYCARDIA 06/01/2007  . COPD (chronic obstructive pulmonary disease) ([place])   . EMPHYSEMA 03/28/2008  . HYPERGLYCEMIA 06/01/2007  . HYPERLIPIDEMIA 06/01/2007  . Hypertension   . HYPERTENSION 01/19/2009    No past  surgical history on file.  Family History  Problem Relation Age of Onset  . Cancer Mother        "Male" cancer  . Uterine cancer Mother   . Heart disease Father   . Colon cancer Neg Hx   . Esophageal cancer Neg Hx   . Rectal cancer Neg Hx   . Stomach cancer Neg Hx     Social History   Socioeconomic History  . Marital status: Married    Spouse name: Not on file  . Number of children: Not on file  . Years of education: Not on file  . Highest education level: Not on file  Social Needs  . Financial resource strain: Not on file  . Food insecurity - worry: Not on file  . Food insecurity - inability: Not on file  . Transportation needs - medical: Not on file  . Transportation needs - non-medical: Not on file  Occupational History  . Occupation: Retired   Tobacco Use  . Smoking status: Former Smoker    Packs/day: 0.50    Years: 40.00    Pack years: 20.00    Last attempt to quit: 07/12/2016    Years since quitting: 1.1  . Smokeless tobacco: Never Used  Substance and Sexual Activity  . Alcohol use: No  . Drug use: No  . Sexual activity: Not on file  Other Topics Concern  . Not  on file  Social History Narrative  . Not on file   Review of systems: Review of Systems  Constitutional: Negative for fever and chills.  HENT: Negative.   Eyes: Negative for blurred vision.  Respiratory: as per HPI  Cardiovascular: Negative for chest pain and palpitations.  Gastrointestinal: Negative for vomiting, diarrhea, blood per rectum. Genitourinary: Negative for dysuria, urgency, frequency and hematuria.  Musculoskeletal: Negative for myalgias, back pain and joint pain.  Skin: Negative for itching and rash.  Neurological: Negative for dizziness, tremors, focal weakness, seizures and loss of consciousness.  Endo/Heme/Allergies: Negative for environmental allergies.  Psychiatric/Behavioral: Negative for depression, suicidal ideas and hallucinations.  All other systems reviewed and are  negative.  Physical Exam: Blood pressure 120/70, pulse 78, height 5\' 11"  (1.803 m), weight 213 lb 9.6 oz (96.9 kg), SpO2 95 %. Gen:      No acute distress HEENT:  EOMI, sclera anicteric Neck:     No masses; no thyromegaly Lungs:    Clear to auscultation bilaterally; normal respiratory effort CV:         Regular rate and rhythm; no murmurs Abd:      + bowel sounds; soft, non-tender; no palpable masses, no distension Ext:    No edema; adequate peripheral perfusion Skin:      Warm and dry; no rash Neuro: alert and oriented x 3 Psych: normal mood and affect  Data Reviewed: PFTs [01/06/05] FVC 4.50 [96%], FEV1 2.85 [89], F/F 63, TLC 7.76 [111%], DLCO 132%. Mild airway obstruction, normal lung volumes, no bronchial dilator response.  Chest x-ray [08/09/11] No acute cardiopulmonary abnormalities. Images reviewed  CBC 11/18/69-[id number] eosinophilic count 338  Assessment:  COPD GOLD A Stable on advair.  Currently on a reduced dose as he cannot afford the medication in donut hole and is more symptomatic We will give him a sample of Breo to tide him over until next year.  If he likes this better than we will switch him over to the [facility] inhaler.  Blood count noted for elevated eosinophils.  He will need to be on inhaled steroid therapy + LABA Give prescription for albuterol rescue inhaler to be used as needed  Plan/Recommendations: Sample of Breo Resume advair next year.   Marshell Garfinkel MD [facility] Pager [phone] 09/15/2017, 11:00 AM  CC: Renato Shin, MD

## 2017-09-15 NOTE — Addendum Note (Signed)
Addended by: Maryanna Shape A on: 09/15/2017 11:29 AM   Modules accepted: Orders

## 2017-09-23 ENCOUNTER — Ambulatory Visit (HOSPITAL_COMMUNITY)
Admission: RE | Admit: 2017-09-23 | Discharge: 2017-09-23 | Disposition: A | Discharge status: Home / Self Care | Payer: Medicare Other | Source: Ambulatory Visit | Attending: Family | Admitting: Family

## 2017-09-23 ENCOUNTER — Encounter: Payer: Self-pay | Admitting: Family

## 2017-09-23 ENCOUNTER — Ambulatory Visit: Payer: Medicare Other | Admitting: Family

## 2017-09-23 VITALS — BP 107/66 | HR 66 | Temp 97.7°F | Resp 19 | Wt 209.7 lb

## 2017-09-23 DIAGNOSIS — I872 Venous insufficiency (chronic) (peripheral): Secondary | ICD-10-CM | POA: Diagnosis not present

## 2017-09-23 DIAGNOSIS — I70213 Atherosclerosis of native arteries of extremities with intermittent claudication, bilateral legs: Secondary | ICD-10-CM | POA: Diagnosis not present

## 2017-09-23 DIAGNOSIS — F17200 Nicotine dependence, unspecified, uncomplicated: Secondary | ICD-10-CM | POA: Diagnosis not present

## 2017-09-23 DIAGNOSIS — I70219 Atherosclerosis of native arteries of extremities with intermittent claudication, unspecified extremity: Secondary | ICD-10-CM | POA: Diagnosis not present

## 2017-09-23 NOTE — Progress Notes (Signed)
[facility] [place]   CC: Follow up peripheral artery occlusive disease  History of Present Illness Bryan Armstrong is a 82 y.o. male who has been having some calf pain bilaterally and has significant varicose veins. He was self-referred for evaluation of venous disease. He initially had pain in both calves which was brought on by ambulation and relieved with rest. His symptoms were more significant on the right side. His symptoms began early in 2018 and had been gradually progressive. He denies any significant thigh or hip claudication. He denies any history of rest pain or nonhealing ulcers. He does not experience symptoms when he is simply standing. He is unaware of any previous history of DVT or phlebitis.  He golfs, and states that his his symptoms in his legs have improved since he was evaluated by Dr. Scot Dock in May 2018.   His risk factors for peripheral vascular disease include hypertension, hyperlipidemia, and tobacco use. He denies any history of diabetes or family history of premature cardiovascular disease.  Dr. Scot Dock last evaluated pt on 03-12-17. At that time right lower extremity venous duplex showed no right lower extremity DVT or superficial thrombophlebitis. He did have reflux in the deep system involving the common femoral vein and femoral vein. He had reflux in the superficial system involving the right saphenofemoral junction and right great saphenous vein. In addition he had some reflux in the short saphenous vein on the right. In regards to PAD, based on his history, Dr. Scot Dock thought that his symptoms in both calves were related to peripheral vascular disease and not related to his chronic venous insufficiency. He did not experience symptoms when he is simply standing. He had evidence of infrainguinal arterial occlusive disease on exam. Dr. Scot Dock discussed the importance of staying off of tobacco, encouraged him to get on a structured walking program,  and the importance of nutrition. If his symptoms progress or become disabling, then certainly we can consider arteriography. Dr. Scot Dock discussed the indications for this and the potential complications. He advised pt to follow up with ABIs in 6 months, call sooner if he has problems. In regards to chronic venous insufficiency, based on his exam and duplex study, pt did have significant chronic venous insufficiency, discussed the importance of intermittent leg elevation in the proper positioning for this. Given his peripheral vascular disease, Dr. Scot Dock would not recommend aggressive compression therapy, and encouraged him to stay as active as possible and try to avoid prolonged sitting and standing.  He denies any known hx of stroke or TIA.    Diabetic: No Tobacco use: smoker  (1-2 cigarettes/day, started about age 59 yrs)  Pt meds include: Statin :Yes Betablocker: No ASA: Yes Other anticoagulants/antiplatelets: no   Past Medical History:  Diagnosis Date  . BRADYCARDIA 06/01/2007  . COPD (chronic obstructive pulmonary disease) ([place])   . EMPHYSEMA 03/28/2008  . HYPERGLYCEMIA 06/01/2007  . HYPERLIPIDEMIA 06/01/2007  . Hypertension   . HYPERTENSION 01/19/2009    Social History Social History   Tobacco Use  . Smoking status: Current Some Day Smoker    Packs/day: 0.50    Years: 40.00    Pack years: 20.00    Types: Cigarettes  . Smokeless tobacco: Never Used  . Tobacco comment: 3-4 cigs a day  Substance Use Topics  . Alcohol use: No  . Drug use: No    Family History Family History  Problem Relation Age of Onset  . Cancer Mother        "  Male" cancer  . Uterine cancer Mother   . Heart disease Father   . Colon cancer Neg Hx   . Esophageal cancer Neg Hx   . Rectal cancer Neg Hx   . Stomach cancer Neg Hx     No past surgical history on file.  No Known Allergies  Current Outpatient Medications  Medication Sig Dispense Refill  . albuterol (PROVENTIL HFA;VENTOLIN HFA)  108 (90 Base) MCG/ACT inhaler Inhale 2 puffs into the lungs every 6 (six) hours as needed for wheezing or shortness of breath. 1 Inhaler 6  . aspirin 81 MG tablet 1/2 tablet by mouth daily    . FLUAD 0.5 ML SUSY ADM 0.5ML IM UTD  0  . Fluticasone-Salmeterol (ADVAIR DISKUS) 250-50 MCG/DOSE AEPB inhale 1 dose by mouth twice a day 3 each 6  . rosuvastatin (CRESTOR) 10 MG tablet Take 1 tablet (10 mg total) by mouth daily. 90 tablet 3  . triamterene-hydrochlorothiazide (MAXZIDE-25) 37.5-25 MG per tablet Take 0.5-1 tablet daily. 30 tablet 2   No current facility-administered medications for this visit.     ROS: See HPI for pertinent positives and negatives.   Physical Examination  Vitals:   09/23/17 1435 09/23/17 1439  BP: 105/61 107/66  Pulse: 66   Resp: 19   Temp: 97.7 F (36.5 C)   TempSrc: Oral   SpO2: 98%   Weight: 209 lb 11.2 oz (95.1 kg)    Body mass index is 29.25 kg/m.  General: A&O x 3, WDWN, male. Gait: normal Eyes: PERRLA. Pulmonary: Respirations are non labored, CTAB, adequate air movement Cardiac: regular rhythm with occasional missed contractions, no detected murmur.         Carotid Bruits Right Left   Negative Negative   Radial pulses are 2+ palpable bilaterally   Adominal aortic pulse is not palpable                         VASCULAR EXAM: Extremities without ischemic changes, without Gangrene; without open wounds.                                                                                                          LE Pulses Right Left       FEMORAL  not palpable  3+ palpable        POPLITEAL  not palpable   not palpable       POSTERIOR TIBIAL  not palpable   not palpable        DORSALIS PEDIS      ANTERIOR TIBIAL not palpable  not palpable    Abdomen: soft, NT, no palpable masses. Skin: no rashes, no ulcers noted. Musculoskeletal: no muscle wasting or atrophy.  Neurologic: A&O X 3; appropriate affect, normal sensation, MOTOR FUNCTION:   moving all extremities equally, motor strength 5/5 throughout. Speech is fluent/normal. CN 2-12 intact.    ASSESSMENT: [name] is a 82 y.o. male who was initially evaluated for bilateral calf pain, worse with ambulation. He has venous reflux on venous duplex,  only the right leg was studied, no DVT in the right leg (03-12-17).  He no longer has pain in his calves, is playing golf, shoveling his driveway, with no claudication symptoms, no edema in his feet or legs, no heavy feeling in his legs, no ulcers or signs of ischemia.   His atherosclerotic risk factors include smoking since age 7, CPPD, and dyslipidemia. He takes a statin and ASA daily.    DATA  ABI (Date: 09/23/2017):  R:   ABI: 0.70 (no previous for comparison),   PT: no documented waveform morphology  DP: no documented waveform morphology  TBI:  0.52  L:   ABI: 0.70 (no previous),   PT: no documented waveform morphology  DP: no documented waveform morphology    TBI: 0.55 Moderate disease in both lower extremities.    PLAN:  Based on the patient's vascular studies and examination, pt will return to clinic in 1 year with ABI's. I advised pt to notify us if he develops concerns re the circulation in his feet or legs.   I discussed in depth with the patient the nature of atherosclerosis, and emphasized the importance of maximal medical management including strict control of blood pressure, blood glucose, and lipid levels, obtaining regular exercise, and cessation of smoking.  The patient is aware that without maximal medical management the underlying atherosclerotic disease process will progress, limiting the benefit of any interventions.  The patient was given information about PAD including signs, symptoms, treatment, what symptoms should prompt the patient to seek immediate medical care, and risk reduction measures to take.  Clemon Chambers, RN, MSN, FNP-C [facility] Phone: [phone]  Clinic MD: Scot Dock  09/23/17 3:00 PM

## 2017-09-23 NOTE — Patient Instructions (Addendum)
Steps to Quit Smoking Smoking tobacco can be bad for your health. It can also affect almost every organ in your body. Smoking puts you and people around you at risk for many serious long-lasting (chronic) diseases. Quitting smoking is hard, but it is one of the best things that you can do for your health. It is never too late to quit. What are the benefits of quitting smoking? When you quit smoking, you lower your risk for getting serious diseases and conditions. They can include:  Lung cancer or lung disease.  Heart disease.  Stroke.  Heart attack.  Not being able to have children (infertility).  Weak bones (osteoporosis) and broken bones (fractures).  If you have coughing, wheezing, and shortness of breath, those symptoms may get better when you quit. You may also get sick less often. If you are pregnant, quitting smoking can help to lower your chances of having a baby of low birth weight. What can I do to help me quit smoking? Talk with your doctor about what can help you quit smoking. Some things you can do (strategies) include:  Quitting smoking totally, instead of slowly cutting back how much you smoke over a period of time.  Going to in-person counseling. You are more likely to quit if you go to many counseling sessions.  Using resources and support systems, such as: ? Online chats with a counselor. ? Phone quitlines. ? Printed self-help materials. ? Support groups or group counseling. ? Text messaging programs. ? Mobile phone apps or applications.  Taking medicines. Some of these medicines may have nicotine in them. If you are pregnant or breastfeeding, do not take any medicines to quit smoking unless your doctor says it is okay. Talk with your doctor about counseling or other things that can help you.  Talk with your doctor about using more than one strategy at the same time, such as taking medicines while you are also going to in-person counseling. This can help make  quitting easier. What things can I do to make it easier to quit? Quitting smoking might feel very hard at first, but there is a lot that you can do to make it easier. Take these steps:  Talk to your family and friends. Ask them to support and encourage you.  Call phone quitlines, reach out to support groups, or work with a counselor.  Ask people who smoke to not smoke around you.  Avoid places that make you want (trigger) to smoke, such as: ? Bars. ? Parties. ? Smoke-break areas at work.  Spend time with people who do not smoke.  Lower the stress in your life. Stress can make you want to smoke. Try these things to help your stress: ? Getting regular exercise. ? Deep-breathing exercises. ? Yoga. ? Meditating. ? Doing a body scan. To do this, close your eyes, focus on one area of your body at a time from head to toe, and notice which parts of your body are tense. Try to relax the muscles in those areas.  Download or buy apps on your mobile phone or tablet that can help you stick to your quit plan. There are many free apps, such as QuitGuide from the CDC (Centers for Disease Control and Prevention). You can find more support from smokefree.gov and other websites.  This information is not intended to replace advice given to you by your health care provider. Make sure you discuss any questions you have with your health care provider. Document Released: 07/26/2009 Document   Revised: 05/27/2016 Document Reviewed: 02/13/2015 Elsevier Interactive Patient Education  2018 [employer].    Intermittent Claudication Intermittent claudication is pain in your leg that occurs when you walk or exercise and goes away when you rest. The pain can occur in one or both legs. What are the causes? Intermittent claudication is caused by the buildup of plaque within the major arteries in the body (atherosclerosis). The plaque, which makes arteries stiff and narrow, prevents enough blood from reaching your  leg muscles. The pain occurs when you walk or exercise because your muscles need more blood when you are moving and exercising. What increases the risk? Risk factors include:  A family history of atherosclerosis.  A personal history of stroke or heart disease.  Older age.  Being inactive or overweight.  Smoking cigarettes.  Having another health condition such as: ? Diabetes. ? High blood pressure. ? High cholesterol.  What are the signs or symptoms? Your hip or leg may:  Ache.  Cramp.  Feel tight.  Feel weak.  Feel heavy.  Over time, you may feel pain in your calf, thigh, or hip. How is this diagnosed? Your health care provider may diagnose intermittent claudication based on your symptoms and medical history. Your health care provider may also do tests to learn more about your condition. These may include:  Blood tests.  An ultrasound.  Imaging tests such as angiography, magnetic resonance angiography (MRA), and computed tomography angiography (CTA).  How is this treated? You may be treated for problems such as:  High blood pressure.  High cholesterol.  Diabetes.  Other treatments may include:  Lifestyle changes such as: ? Starting an exercise program. ? Losing weight. ? Quitting smoking.  Medicines to help restore blood flow through your legs.  Blood vessel surgery (angioplasty) to restore blood flow if your intermittent claudication is caused by severe peripheral artery disease.  Follow these instructions at home:  Manage any other health conditions you have.  Eat a diet low in saturated fats and calories to maintain a healthy weight.  Quit smoking, if you smoke.  Take medicines only as directed by your health care provider.  If your health care provider recommended an exercise program for you, follow it as directed. Your exercise program may involve: ? Walking three or more times a week. ? Walking until you have certain symptoms of  intermittent claudication. ? Resting until symptoms go away. ? Gradually increasing walking time to about 50 minutes a day. Contact a health care provider if: Your condition is not getting better or is getting worse. Get help right away if:  You have chest pain.  You have difficulty breathing.  You develop arm weakness.  You have trouble speaking.  Your face begins to droop. This information is not intended to replace advice given to you by your health care provider. Make sure you discuss any questions you have with your health care provider. Document Released: 08/01/2004 Document Revised: 03/06/2016 Document Reviewed: 01/05/2014 Elsevier Interactive Patient Education  2017 [employer].    To decrease swelling in your feet and legs: Elevate feet above slightly bent knees, feet above heart, overnight and 3-4 times per day for 20 minutes.

## 2017-10-05 NOTE — Addendum Note (Signed)
Addended by: Lianne Cure A on: 10/05/2017 11:45 AM   Modules accepted: Orders

## 2017-10-27 ENCOUNTER — Telehealth: Payer: Self-pay | Admitting: Pulmonary Disease

## 2017-10-27 NOTE — Telephone Encounter (Signed)
lmomtcb x 1 for the pt 

## 2017-10-28 NOTE — Telephone Encounter (Signed)
ATC pt, no answer. Left message for pt to call back.  

## 2017-10-29 NOTE — Telephone Encounter (Signed)
Pt is calling back [phone]

## 2017-10-29 NOTE — Telephone Encounter (Signed)
Called and spoke with pt who stated he had questions regarding the number of refills that was stated on his advair. Pt asked what he was supposed to do when he was out of refills of his medication.  I stated to pt to call our office before he uses his last refill to let us know he is needing a new RX of his med sent to his pharmacy and we would then take care of sending that in for him. Pt expressed understanding. Nothing further needed.

## 2017-10-29 NOTE — Telephone Encounter (Signed)
Attempted to contact pt. He answered the line but then disconnected it. Tried to call pt back but received a busy signal. Will try back.

## 2017-12-15 ENCOUNTER — Encounter: Payer: Self-pay | Admitting: Endocrinology

## 2017-12-15 ENCOUNTER — Ambulatory Visit: Payer: Medicare Other | Admitting: Endocrinology

## 2017-12-15 VITALS — BP 108/70 | HR 60 | Temp 97.7°F | Wt 218.0 lb

## 2017-12-15 DIAGNOSIS — Z0000 Encounter for general adult medical examination without abnormal findings: Secondary | ICD-10-CM | POA: Diagnosis not present

## 2017-12-15 DIAGNOSIS — I10 Essential (primary) hypertension: Secondary | ICD-10-CM | POA: Diagnosis not present

## 2017-12-15 DIAGNOSIS — E785 Hyperlipidemia, unspecified: Secondary | ICD-10-CM

## 2017-12-15 DIAGNOSIS — Z125 Encounter for screening for malignant neoplasm of prostate: Secondary | ICD-10-CM | POA: Diagnosis not present

## 2017-12-15 DIAGNOSIS — R7309 Other abnormal glucose: Secondary | ICD-10-CM | POA: Diagnosis not present

## 2017-12-15 LAB — CBC WITH DIFFERENTIAL/PLATELET
Basophils Absolute: 0.1 10*3/uL (ref 0.0–0.1)
Basophils Relative: 0.7 % (ref 0.0–3.0)
EOS ABS: 0.4 10*3/uL (ref 0.0–0.7)
Eosinophils Relative: 4.2 % (ref 0.0–5.0)
HCT: 39.9 % (ref 39.0–52.0)
Hemoglobin: 13.5 g/dL (ref 13.0–17.0)
LYMPHS ABS: 1.7 10*3/uL (ref 0.7–4.0)
Lymphocytes Relative: 19.2 % (ref 12.0–46.0)
MCHC: 33.9 g/dL (ref 30.0–36.0)
MCV: 88.5 fl (ref 78.0–100.0)
MONOS PCT: 7 % (ref 3.0–12.0)
Monocytes Absolute: 0.6 10*3/uL (ref 0.1–1.0)
NEUTROS ABS: 6.1 10*3/uL (ref 1.4–7.7)
NEUTROS PCT: 68.9 % (ref 43.0–77.0)
PLATELETS: 188 10*3/uL (ref 150.0–400.0)
RBC: 4.51 Mil/uL (ref 4.22–5.81)
RDW: 14.1 % (ref 11.5–15.5)
WBC: 8.9 10*3/uL (ref 4.0–10.5)

## 2017-12-15 LAB — HEPATIC FUNCTION PANEL
ALBUMIN: 4 g/dL (ref 3.5–5.2)
ALK PHOS: 53 U/L (ref 39–117)
ALT: 17 U/L (ref 0–53)
AST: 16 U/L (ref 0–37)
BILIRUBIN TOTAL: 0.7 mg/dL (ref 0.2–1.2)
Bilirubin, Direct: 0.2 mg/dL (ref 0.0–0.3)
Total Protein: 6.7 g/dL (ref 6.0–8.3)

## 2017-12-15 LAB — URINALYSIS, ROUTINE W REFLEX MICROSCOPIC
BILIRUBIN URINE: NEGATIVE
HGB URINE DIPSTICK: NEGATIVE
KETONES UR: NEGATIVE
Leukocytes, UA: NEGATIVE
NITRITE: NEGATIVE
Specific Gravity, Urine: 1.025 (ref 1.000–1.030)
TOTAL PROTEIN, URINE-UPE24: NEGATIVE
UROBILINOGEN UA: 0.2 (ref 0.0–1.0)
Urine Glucose: NEGATIVE
pH: 6 (ref 5.0–8.0)

## 2017-12-15 LAB — BASIC METABOLIC PANEL
BUN: 22 mg/dL (ref 6–23)
CHLORIDE: 103 meq/L (ref 96–112)
CO2: 31 mEq/L (ref 19–32)
Calcium: 9.1 mg/dL (ref 8.4–10.5)
Creatinine, Ser: 1.05 mg/dL (ref 0.40–1.50)
GFR: 72.04 mL/min (ref >60.00)
Glucose, Bld: 98 mg/dL (ref 70–99)
POTASSIUM: 4.6 meq/L (ref 3.5–5.1)
SODIUM: 138 meq/L (ref 135–145)

## 2017-12-15 LAB — LIPID PANEL
CHOLESTEROL: 109 mg/dL (ref 0–200)
HDL: 34.8 mg/dL — ABNORMAL LOW (ref >39.00)
LDL CALC: 46 mg/dL (ref 0–99)
NonHDL: 74.03
TRIGLYCERIDES: 141 mg/dL (ref 0.0–149.0)
Total CHOL/HDL Ratio: 3
VLDL: 28.2 mg/dL (ref 0.0–40.0)

## 2017-12-15 LAB — TSH: TSH: 2.13 u[IU]/mL (ref 0.35–4.50)

## 2017-12-15 LAB — PSA: PSA: 1.28 ng/mL (ref 0.10–4.00)

## 2017-12-15 LAB — HEMOGLOBIN A1C: HEMOGLOBIN A1C: 5.9 % (ref 4.6–6.5)

## 2017-12-15 MED ORDER — TRIAMTERENE-HCTZ 37.5-25 MG PO TABS: 0.5000 | ORAL_TABLET | Freq: Every day | ORAL | 5 refills | Status: DC

## 2017-12-15 NOTE — Progress Notes (Signed)
we discussed code status.  pt requests full code, but would not want to be started or maintained on artificial life-support measures if there was not a reasonable chance of recovery 

## 2017-12-15 NOTE — Patient Instructions (Addendum)
blood tests are requested for you today.  We'll let you know about the results.  I have sent a prescription to your pharmacy, to reduce the triamterene/HCTZ.  Please consider these measures for your health:  minimize alcohol.  Do not use tobacco products.  Have a colonoscopy at least every 10 years from age 83.   Keep firearms safely stored.  Always use seat belts.  have working smoke alarms in your home.  See an eye doctor and dentist regularly.  Never drive under the influence of alcohol or drugs (including prescription drugs).  Those with fair skin should take precautions against the sun, and should carefully examine their skin once per month, for any new or changed moles. It is critically important to prevent falling down (keep floor areas well-lit, dry, and free of loose objects.  If you have a cane, walker, or wheelchair, you should use it, even for short trips around the house.  Wear flat-soled shoes.  Also, try not to rush).   Please come back for a follow-up appointment in 1 year.

## 2017-12-15 NOTE — Progress Notes (Addendum)
Subjective:    Patient ID: Bryan Armstrong, male    DOB: August 27, 1935, 83 y.o.   MRN: [id number]  HPI Pt is here for regular wellness examination, and is feeling pretty well in general, and says chronic med probs are stable, except as noted below Past Medical History:  Diagnosis Date  . BRADYCARDIA 06/01/2007  . COPD (chronic obstructive pulmonary disease) ([place])   . EMPHYSEMA 03/28/2008  . HYPERGLYCEMIA 06/01/2007  . HYPERLIPIDEMIA 06/01/2007  . Hypertension   . HYPERTENSION 01/19/2009    No past surgical history on file.  Social History   Socioeconomic History  . Marital status: Married    Spouse name: Not on file  . Number of children: Not on file  . Years of education: Not on file  . Highest education level: Not on file  Social Needs  . Financial resource strain: Not on file  . Food insecurity - worry: Not on file  . Food insecurity - inability: Not on file  . Transportation needs - medical: Not on file  . Transportation needs - non-medical: Not on file  Occupational History  . Occupation: Retired   Tobacco Use  . Smoking status: Former Smoker    Packs/day: 0.50    Years: 40.00    Pack years: 20.00    Types: Cigarettes    Last attempt to quit: 09/26/2017    Years since quitting: 0.2  . Smokeless tobacco: Never Used  . Tobacco comment: 3-4 cigs a day  Substance and Sexual Activity  . Alcohol use: No  . Drug use: No  . Sexual activity: Not on file  Other Topics Concern  . Not on file  Social History Narrative  . Not on file    Current Outpatient Medications on File Prior to Visit  Medication Sig Dispense Refill  . albuterol (PROVENTIL HFA;VENTOLIN HFA) 108 (90 Base) MCG/ACT inhaler Inhale 2 puffs into the lungs every 6 (six) hours as needed for wheezing or shortness of breath. 1 Inhaler 6  . aspirin 81 MG tablet 1/2 tablet by mouth daily    . FLUAD 0.5 ML SUSY ADM 0.5ML IM UTD  0  . Fluticasone-Salmeterol (ADVAIR DISKUS) 250-50 MCG/DOSE AEPB inhale 1 dose by  mouth twice a day 3 each 6   No current facility-administered medications on file prior to visit.     No Known Allergies  Family History  Problem Relation Age of Onset  . Cancer Mother        "Male" cancer  . Uterine cancer Mother   . Heart disease Father   . Colon cancer Neg Hx   . Esophageal cancer Neg Hx   . Rectal cancer Neg Hx   . Stomach cancer Neg Hx     BP 108/70 (BP Location: Left Arm, Patient Position: Sitting, Cuff Size: Normal)   Pulse 60   Temp 97.7 F (36.5 C) (Oral)   Wt 218 lb (98.9 kg)   SpO2 97%   BMI 30.40 kg/m    Review of Systems Denies fever, fatigue, diplopia, earache, chest pain, sob, back pain, anxiety, cold intolerance, BRBPR, hematuria, syncope, numbness, allergy sxs, easy bruising, and rash.      Objective:   Physical Exam VS: see vs page GEN: no distress HEAD: head: no deformity eyes: no periorbital swelling, no proptosis external nose and ears are normal mouth: no lesion seen NECK: supple, thyroid is not enlarged CHEST WALL: no deformity LUNGS: clear to auscultation BREASTS:  No gynecomastia CV: reg rate and rhythm,  no murmur ABD: abdomen is soft, nontender.  no hepatosplenomegaly.  not distended.  no hernia RECTAL: normal external and internal exam.  heme neg. PROSTATE:  Normal size.  No nodule MUSCULOSKELETAL: muscle bulk and strength are grossly normal.  no obvious joint swelling.  gait is normal and steady EXTEMITIES: no deformity.  no ulcer on the feet.  feet are of normal color and temp.  no edema PULSES: dorsalis pedis intact bilat.  no carotid bruit NEURO:  cn 2-12 grossly intact.   readily moves all 4's.  sensation is intact to touch on the feet SKIN:  Normal texture and temperature.  No rash or suspicious lesion is visible.   NODES:  None palpable at the neck.   PSYCH: alert, well-oriented.  Does not appear anxious nor depressed.    I personally reviewed electrocardiogram tracing (today): Indication: HTN Impression:  SB  No MI.  No hypertrophy. Compared to 2014: no significant change  Lab Results  Component Value Date   HGBA1C 5.9 12/15/2017      Assessment & Plan:  Wellness visit today, with problems stable, except as noted. a1c is ordered for hyperglycemia.  It is a  Stable problem  Subjective:   Patient here for Medicare annual wellness visit and management of other chronic and acute problems.     Risk factors: advanced age    26 of Physicians Providing Medical Care to Patient:  See "snapshot"   Activities of Daily Living: In your present state of health, do you have any difficulty performing the following activities (lives with wife)?:  Preparing food and eating?: No  Bathing yourself: No  Getting dressed: No  Using the toilet:No  Moving around from place to place: No  In the past year have you fallen or had a near fall?: No    Home Safety: Has smoke detector and wears seat belts. No firearms. No excess sun exposure.  Opioid Use: none  Diet and Exercise  Current exercise habits: pt says good Dietary issues discussed: pt reports a somewhat healthy diet   Depression Screen  Q1: Over the past two weeks, have you felt down, depressed or hopeless? no  Q2: Over the past two weeks, have you felt little interest or pleasure in doing things? no   The following portions of the patient's history were reviewed and updated as appropriate: allergies, current medications, past family history, past medical history, past social history, past surgical history and problem list.   Review of Systems  Denies hearing loss, and visual loss Objective:   Vision:  Advertising account executive, so he declines VA today Hearing: grossly normal Body mass index:  See vs page Msk: pt easily and quickly performs "get-up-and-go" from a sitting position Cognitive Impairment Assessment: cognition, memory and judgment appear normal.  remembers 3/3 at 5 minutes.  excellent recall.  can easily read and write a sentence.   alert and oriented x 3   Assessment:   Medicare wellness utd on preventive parameters    Plan:   During the course of the visit the patient was educated and counseled about appropriate screening and preventive services including:        Fall prevention is advised today Diabetes screening  Nutrition counseling is offered  advanced directives/end of life addressed today:  see healthcare directives hyperlink  Vaccines are updated as needed  Patient Instructions (the written plan) was given to the patient.   cologuard form is signed

## 2017-12-16 ENCOUNTER — Other Ambulatory Visit: Payer: Self-pay

## 2017-12-16 ENCOUNTER — Telehealth: Payer: Self-pay | Admitting: Endocrinology

## 2017-12-16 MED ORDER — ROSUVASTATIN CALCIUM 10 MG PO TABS: 10.0000 mg | ORAL_TABLET | Freq: Every day | ORAL | 3 refills | Status: DC

## 2017-12-16 NOTE — Telephone Encounter (Signed)
Should patient be on this medication? Refill is requested but not on current med list?

## 2017-12-16 NOTE — Telephone Encounter (Signed)
Was changed to generic crestor in 2018

## 2017-12-16 NOTE — Telephone Encounter (Signed)
I have sent Crestor.

## 2017-12-16 NOTE — Telephone Encounter (Signed)
Refill for simvastatin 90 pills 3 refills  send to [place], [address] AT [place] #:  [id number]

## 2017-12-18 ENCOUNTER — Telehealth: Payer: Self-pay | Admitting: Endocrinology

## 2017-12-18 NOTE — Telephone Encounter (Signed)
Patient would like someone to call with lab results.  Please advise

## 2017-12-18 NOTE — Telephone Encounter (Signed)
I have called & reviewed labs with patient. I have also sent him copies via mail.

## 2017-12-23 DIAGNOSIS — Z1212 Encounter for screening for malignant neoplasm of rectum: Secondary | ICD-10-CM | POA: Diagnosis not present

## 2017-12-23 DIAGNOSIS — Z1211 Encounter for screening for malignant neoplasm of colon: Secondary | ICD-10-CM | POA: Diagnosis not present

## 2017-12-23 LAB — COLOGUARD

## 2018-01-01 ENCOUNTER — Telehealth: Payer: Self-pay | Admitting: Endocrinology

## 2018-01-01 DIAGNOSIS — Z1211 Encounter for screening for malignant neoplasm of colon: Secondary | ICD-10-CM | POA: Insufficient documentation

## 2018-01-01 NOTE — Telephone Encounter (Signed)
I called patient & stated that a referral had been placed for gastro since his results were positive. He was concerned but I asked that he give gastro a few days to receive referral & give him a call for appt.

## 2018-01-01 NOTE — Telephone Encounter (Signed)
[facility] is calling on that status of a fax they sent over this morning for Dr Loanne Drilling   If you do not have this please call and let them know  [phone] Follow the prompts for Provider Support

## 2018-01-01 NOTE — Telephone Encounter (Signed)
please call patient: cologuard is positive. Please see a specialist.  you will receive a phone call, about a day and time for an appointment

## 2018-01-04 NOTE — Telephone Encounter (Signed)
Patient has been notified & referral made.

## 2018-01-07 ENCOUNTER — Telehealth: Payer: Self-pay | Admitting: Endocrinology

## 2018-01-07 ENCOUNTER — Encounter: Payer: Self-pay | Admitting: Gastroenterology

## 2018-01-07 ENCOUNTER — Ambulatory Visit: Payer: Medicare Other | Admitting: Gastroenterology

## 2018-01-07 VITALS — BP 120/82 | HR 64 | Ht 71.0 in | Wt 216.6 lb

## 2018-01-07 DIAGNOSIS — Z86010 Personal history of colonic polyps: Secondary | ICD-10-CM

## 2018-01-07 DIAGNOSIS — R195 Other fecal abnormalities: Secondary | ICD-10-CM | POA: Insufficient documentation

## 2018-01-07 MED ORDER — PEG 3350-KCL-NA BICARB-NACL 420 G PO SOLR: 4000.0000 mL | ORAL | 0 refills | Status: DC

## 2018-01-07 NOTE — Patient Instructions (Signed)
Normal BMI (Body Mass Index- based on height and weight) is between 23 and 30. Your BMI today is Body mass index is 30.21 kg/m. Marland Kitchen Please consider follow up  regarding your BMI with your Primary Care Provider.  You have been scheduled for a colonoscopy. Please follow written instructions given to you at your visit today.  Please pick up your prep supplies at the pharmacy within the next 1-3 days. If you use inhalers (even only as needed), please bring them with you on the day of your procedure. Your physician has requested that you go to www.startemmi.com and enter the access code given to you at your visit today. This web site gives a general overview about your procedure. However, you should still follow specific instructions given to you by our office regarding your preparation for the procedure.

## 2018-01-07 NOTE — Progress Notes (Signed)
01/07/2018 Bryan Armstrong [id number] Mar 05, 1935   HISTORY OF PRESENT ILLNESS: This is a pleasant 83 year old male who is a patient of Dr. Ardis Hughs.  He has history of adenomatous colon polyps and his last colonoscopy was performed in March 2013 at which time 3 small polyps were removed.  Repeat colonoscopy was recommended at a 5-year interval.  The patient was seen by Dr. Ardis Hughs last year to discuss this since he was 83 years old at that point.  Upon discussion it was determined that he would no longer undergo surveillance colonoscopy.  He returns to our office today, however, at the referral of his PCP, Dr. Loanne Drilling, for evaluation regarding a positive Cologuard study.  PCP ordered this study earlier this year and the results were positive.  Patient denies absolutely any GI symptoms including dark or bloody stools.  Says he moves his bowels regularly.   Past Medical History:  Diagnosis Date  . BRADYCARDIA 06/01/2007  . COPD (chronic obstructive pulmonary disease) ([place])   . EMPHYSEMA 03/28/2008  . HYPERGLYCEMIA 06/01/2007  . HYPERLIPIDEMIA 06/01/2007  . Hypertension   . HYPERTENSION 01/19/2009   History reviewed. No pertinent surgical history.  reports that he quit smoking about 3 months ago. His smoking use included cigarettes. He has a 20.00 pack-year smoking history. He has never used smokeless tobacco. He reports that he does not drink alcohol or use drugs. family history includes Cancer in his mother; Heart disease in his father; Uterine cancer in his mother. No Known Allergies    Outpatient Encounter Medications as of 01/07/2018  Medication Sig  . albuterol (PROVENTIL HFA;VENTOLIN HFA) 108 (90 Base) MCG/ACT inhaler Inhale 2 puffs into the lungs every 6 (six) hours as needed for wheezing or shortness of breath.  Marland Kitchen aspirin 81 MG tablet 1/2 tablet by mouth daily  . FLUAD 0.5 ML SUSY ADM 0.5ML IM UTD  . Fluticasone-Salmeterol (ADVAIR DISKUS) 250-50 MCG/DOSE AEPB inhale 1 dose by mouth  twice a day  . rosuvastatin (CRESTOR) 10 MG tablet Take 1 tablet (10 mg total) by mouth daily.  Marland Kitchen triamterene-hydrochlorothiazide (MAXZIDE-25) 37.5-25 MG tablet Take 0.5 tablets by mouth daily.   No facility-administered encounter medications on file as of 01/07/2018.      REVIEW OF SYSTEMS  : All other systems reviewed and negative except where noted in the History of Present Illness.   PHYSICAL EXAM: BP 120/82   Pulse 64   Ht 5\' 11"  (1.803 m)   Wt 216 lb 9.6 oz (98.2 kg)   SpO2 98%   BMI 30.21 kg/m   General: Well developed white male in no acute distress Head: Normocephalic and atraumatic Eyes:  Sclerae anicteric, conjunctiva pink. Ears: Normal auditory acuity Lungs: Clear throughout to auscultation; no increased WOB. Heart: Regular rate and rhythm; no M/R/G. Abdomen: Soft, non-distended.  BS present.  Non-tender. Rectal:  Will be done at the time of colonoscopy. Musculoskeletal: Symmetrical with no gross deformities  Skin: No lesions on visible extremities Extremities: No edema  Neurological: Alert oriented x 4, grossly non-focal Psychological:  Alert and cooperative. Normal mood and affect  ASSESSMENT AND PLAN: *83 year old male who has a history of colon polyps, but upon discussion with Dr. Ardis Hughs last year, was not going to continue to undergo surveillance colonoscopy due to age.  Cologuard was ordered by PCP recently, however, and the study was positive unfortunately.  Will schedule for colonoscopy with Dr. Ardis Hughs.    **The risks, benefits, and alternatives to colonoscopy were discussed  with the patient and he consents to proceed.   CC:  Bryan Shin, Bryan Armstrong

## 2018-01-07 NOTE — Telephone Encounter (Signed)
Kelly from GI stated that patient is in there office right now, he was referred to there office for a positive cologard result, they need office note concerning this.  Fax [phone]

## 2018-01-07 NOTE — Progress Notes (Signed)
I agree with the above note, plan. Colonoscopy for + cologuard colon cancer screening test at age 83.

## 2018-01-07 NOTE — Telephone Encounter (Signed)
I found results from scan pile & I have faxed to GI.

## 2018-01-07 NOTE — Telephone Encounter (Signed)
I don't see any note regarding this. I know we received results from cologuard, please advise?

## 2018-01-07 NOTE — Telephone Encounter (Signed)
The results was sent for scanning, so we don't have it now.

## 2018-01-13 ENCOUNTER — Other Ambulatory Visit: Payer: Self-pay

## 2018-01-13 ENCOUNTER — Encounter: Payer: Self-pay | Admitting: Gastroenterology

## 2018-01-13 ENCOUNTER — Ambulatory Visit (AMBULATORY_SURGERY_CENTER): Payer: Medicare Other | Admitting: Gastroenterology

## 2018-01-13 VITALS — BP 159/66 | HR 48 | Temp 99.3°F | Resp 15 | Ht 71.0 in | Wt 216.0 lb

## 2018-01-13 DIAGNOSIS — R195 Other fecal abnormalities: Secondary | ICD-10-CM | POA: Diagnosis present

## 2018-01-13 DIAGNOSIS — Z86010 Personal history of colonic polyps: Secondary | ICD-10-CM | POA: Diagnosis not present

## 2018-01-13 DIAGNOSIS — Z538 Procedure and treatment not carried out for other reasons: Secondary | ICD-10-CM | POA: Diagnosis not present

## 2018-01-13 MED ORDER — SODIUM CHLORIDE 0.9 % IV SOLN: 500.0000 mL | INTRAVENOUS | Status: DC

## 2018-01-13 NOTE — Patient Instructions (Signed)
YOU HAD AN ENDOSCOPIC PROCEDURE TODAY AT THE [facility]:   Refer to the procedure report that was given to you for any specific questions about what was found during the examination.  If the procedure report does not answer your questions, please call your gastroenterologist to clarify.  If you requested that your care partner not be given the details of your procedure findings, then the procedure report has been included in a sealed envelope for you to review at your convenience later.  YOU SHOULD EXPECT: Some feelings of bloating in the abdomen. Passage of more gas than usual.  Walking can help get rid of the air that was put into your GI tract during the procedure and reduce the bloating. If you had a lower endoscopy (such as a colonoscopy or flexible sigmoidoscopy) you may notice spotting of blood in your stool or on the toilet paper. If you underwent a bowel prep for your procedure, you may not have a normal bowel movement for a few days.  Please Note:  You might notice some irritation and congestion in your nose or some drainage.  This is from the oxygen used during your procedure.  There is no need for concern and it should clear up in a day or so.  SYMPTOMS TO REPORT IMMEDIATELY:   Following lower endoscopy (colonoscopy or flexible sigmoidoscopy):  Excessive amounts of blood in the stool  Significant tenderness or worsening of abdominal pains  Swelling of the abdomen that is new, acute  Fever of 100F or higher  For urgent or emergent issues, a gastroenterologist can be reached at any hour by calling [phone].   DIET:  We do recommend a small meal at first, but then you may proceed to your regular diet.  Drink plenty of fluids but you should avoid alcoholic beverages for 24 hours.  ACTIVITY:  You should plan to take it easy for the rest of today and you should NOT DRIVE or use heavy machinery until tomorrow (because of the sedation medicines used during the test).     FOLLOW UP: Our staff will call the number listed on your records the next business day following your procedure to check on you and address any questions or concerns that you may have regarding the information given to you following your procedure. If we do not reach you, we will leave a message.  However, if you are feeling well and you are not experiencing any problems, there is no need to return our call.  We will assume that you have returned to your regular daily activities without incident.  If any biopsies were taken you will be contacted by phone or by letter within the next 1-3 weeks.  Please call us at [phone] if you have not heard about the biopsies in 3 weeks.    SIGNATURES/CONFIDENTIALITY: You and/or your care partner have signed paperwork which will be entered into your electronic medical record.  These signatures attest to the fact that that the information above on your After Visit Summary has been reviewed and is understood.  Full responsibility of the confidentiality of this discharge information lies with you and/or your care-partner. 

## 2018-01-13 NOTE — Progress Notes (Signed)
Report given to PACU, vss 

## 2018-01-13 NOTE — Op Note (Signed)
[place] Patient Name: Bryan Armstrong Procedure Date: 01/13/2018 3:09 PM MRN: [id number] Endoscopist: Milus Banister , MD Age: 83 Referring MD:  Date of Birth: July 29, 1935 Gender: Male Account #: [id number] Procedure:                Colonoscopy Indications:              Positive Cologuard test (ordered by PCP) Medicines:                Monitored Anesthesia Care Procedure:                Pre-Anesthesia Assessment:                           - Prior to the procedure, a History and Physical                            was performed, and patient medications and                            allergies were reviewed. The patient's tolerance of                            previous anesthesia was also reviewed. The risks                            and benefits of the procedure and the sedation                            options and risks were discussed with the patient.                            All questions were answered, and informed consent                            was obtained. Prior Anticoagulants: The patient has                            taken no previous anticoagulant or antiplatelet                            agents. ASA Grade Assessment: II - A patient with                            mild systemic disease. After reviewing the risks                            and benefits, the patient was deemed in                            satisfactory condition to undergo the procedure.                           After obtaining informed consent, the colonoscope  was passed under direct vision. Throughout the                            procedure, the patient's blood pressure, pulse, and                            oxygen saturations were monitored continuously. The                            Colonoscope was introduced through the anus with                            the intention of advancing to the cecum. The scope                            was advanced to the  sigmoid colon before the                            procedure was aborted due to poor prep. Medications                            were given. The patient tolerated the procedure                            well. The quality of the bowel preparation was                            poor. No anatomical landmarks were photographed. Scope In: 3:25:27 PM Scope Out: 3:26:51 PM Total Procedure Duration: 0 hours 1 minute 24 seconds  Findings:                 A large amount of stool (solid and liquid) was                            found in the visualized colon (up to the midsigmoid                            colon) precluding visualization. Complications:            No immediate complications. Estimated blood loss:                            None. Estimated Blood Loss:     Estimated blood loss: none. Impression:               - Poor prep precluded examination of the colon                            (extent of this exam was to the mid sigmoid colon). Recommendation:           - Patient has a contact number available for                            emergencies. The signs and symptoms of potential  delayed complications were discussed with the                            patient. Return to normal activities tomorrow.                            Written discharge instructions were provided to the                            patient.                           - Resume previous diet.                           - Continue present medications.                           - Repeat colonoscopy at next available appointment                            (within 3 months) because the bowel preparation was                            poor. Will need to follow "double prep" protocol. Milus Banister, MD 01/13/2018 3:35:17 PM This report has been signed electronically.

## 2018-01-13 NOTE — Progress Notes (Signed)
Per Pt he had a cologuard come back positive.  Dr. Loanne Drilling is his primary md and he asked pt if he wanted to do a colonoscopy and pt said yes.  maw

## 2018-01-14 ENCOUNTER — Telehealth: Payer: Self-pay

## 2018-01-14 ENCOUNTER — Telehealth: Payer: Self-pay | Admitting: Gastroenterology

## 2018-01-14 NOTE — Telephone Encounter (Signed)
Call 4/5 per pt request

## 2018-01-14 NOTE — Telephone Encounter (Signed)
  Follow up Call-  Call back number 01/13/2018  Post procedure Call Back phone  # [phone] hm  Permission to leave phone message Yes  Some recent data might be hidden     Patient questions:  Do you have a fever, pain , or abdominal swelling? No. Pain Score  0 *  Have you tolerated food without any problems? Yes.    Have you been able to return to your normal activities? Yes.    Do you have any questions about your discharge instructions: Diet   No. Medications  No. Follow up visit  No.  Do you have questions or concerns about your Care? No.  Actions: * If pain score is 4 or above: No action needed, pain <4.

## 2018-01-15 ENCOUNTER — Encounter: Payer: Self-pay | Admitting: Gastroenterology

## 2018-01-15 NOTE — Telephone Encounter (Signed)
Patient returning call to [facility] with questions about rescheduling colon.

## 2018-01-18 NOTE — Telephone Encounter (Signed)
The pt has been scheduled for previsit and colon  

## 2018-01-19 ENCOUNTER — Encounter: Payer: Medicare Other | Admitting: Gastroenterology

## 2018-03-09 DIAGNOSIS — M1711 Unilateral primary osteoarthritis, right knee: Secondary | ICD-10-CM | POA: Diagnosis not present

## 2018-03-11 ENCOUNTER — Other Ambulatory Visit: Payer: Self-pay

## 2018-03-11 ENCOUNTER — Ambulatory Visit (AMBULATORY_SURGERY_CENTER): Payer: Self-pay | Admitting: *Deleted

## 2018-03-11 VITALS — Ht 71.0 in | Wt 211.0 lb

## 2018-03-11 DIAGNOSIS — Z86010 Personal history of colonic polyps: Secondary | ICD-10-CM

## 2018-03-11 DIAGNOSIS — R195 Other fecal abnormalities: Secondary | ICD-10-CM

## 2018-03-11 MED ORDER — NA SULFATE-K SULFATE-MG SULF 17.5-3.13-1.6 GM/177ML PO SOLN: 1.0000 | Freq: Once | ORAL | 0 refills | Status: AC

## 2018-03-11 NOTE — Progress Notes (Signed)
No egg or soy allergy known to patient  No issues with past sedation with any surgeries  or procedures, no intubation problems  No diet pills per patient No home 02 use per patient  No blood thinners per patient  Pt denies issues with constipation  No A fib or A flutter  EMMI video offered, patient declined. Two day prep instructions reviewed until patient verbalized understanding

## 2018-03-15 ENCOUNTER — Encounter: Payer: Self-pay | Admitting: Gastroenterology

## 2018-03-23 ENCOUNTER — Telehealth: Payer: Self-pay | Admitting: Gastroenterology

## 2018-03-23 NOTE — Telephone Encounter (Signed)
Returned phone call to patient and advised him not to eat anything further and follow instructions for prep tonight and tomorrow as directed.

## 2018-03-24 ENCOUNTER — Encounter

## 2018-03-24 ENCOUNTER — Ambulatory Visit (AMBULATORY_SURGERY_CENTER): Payer: Medicare Other | Admitting: Gastroenterology

## 2018-03-24 ENCOUNTER — Other Ambulatory Visit: Payer: Self-pay

## 2018-03-24 ENCOUNTER — Encounter: Payer: Self-pay | Admitting: Gastroenterology

## 2018-03-24 VITALS — BP 172/97 | HR 45 | Temp 97.7°F | Resp 24 | Ht 71.0 in | Wt 211.0 lb

## 2018-03-24 DIAGNOSIS — D123 Benign neoplasm of transverse colon: Secondary | ICD-10-CM | POA: Diagnosis not present

## 2018-03-24 DIAGNOSIS — D125 Benign neoplasm of sigmoid colon: Secondary | ICD-10-CM

## 2018-03-24 DIAGNOSIS — R195 Other fecal abnormalities: Secondary | ICD-10-CM | POA: Diagnosis present

## 2018-03-24 DIAGNOSIS — D122 Benign neoplasm of ascending colon: Secondary | ICD-10-CM | POA: Diagnosis not present

## 2018-03-24 MED ORDER — SODIUM CHLORIDE 0.9 % IV SOLN: 500.0000 mL | Freq: Once | INTRAVENOUS | Status: DC

## 2018-03-24 NOTE — Progress Notes (Signed)
Report to PACU, RN, vss, BBS= Clear.  

## 2018-03-24 NOTE — Progress Notes (Signed)
Called to room to assist during endoscopic procedure.  Patient ID and intended procedure confirmed with present staff. Received instructions for my participation in the procedure from the performing physician.  

## 2018-03-24 NOTE — Op Note (Signed)
[facility] [place] Patient Name: Bryan Armstrong Procedure Date: 03/24/2018 10:46 AM MRN: [id number] Endoscopist: Milus Banister , MD Age: 83 Referring MD:  Date of Birth: 12/22/1934 Gender: Male Account #: [id number] Procedure:                Colonoscopy Indications:              Positive Cologuard test Medicines:                Monitored Anesthesia Care Procedure:                Pre-Anesthesia Assessment:                           - Prior to the procedure, a History and Physical                            was performed, and patient medications and                            allergies were reviewed. The patient's tolerance of                            previous anesthesia was also reviewed. The risks                            and benefits of the procedure and the sedation                            options and risks were discussed with the patient.                            All questions were answered, and informed consent                            was obtained. Prior Anticoagulants: The patient has                            taken no previous anticoagulant or antiplatelet                            agents. ASA Grade Assessment: II - A patient with                            mild systemic disease. After reviewing the risks                            and benefits, the patient was deemed in                            satisfactory condition to undergo the procedure.                           After obtaining informed consent, the colonoscope  was passed under direct vision. Throughout the                            procedure, the patient's blood pressure, pulse, and                            oxygen saturations were monitored continuously. The                            Colonoscope was introduced through the anus and                            advanced to the the cecum, identified by                            appendiceal orifice and ileocecal valve. The                            colonoscopy was performed without difficulty. The                            patient tolerated the procedure well. The quality                            of the bowel preparation was good. The ileocecal                            valve, appendiceal orifice, and rectum were                            photographed. Scope In: 10:57:47 AM Scope Out: 11:15:11 AM Scope Withdrawal Time: 0 hours 12 minutes 31 seconds  Total Procedure Duration: 0 hours 17 minutes 24 seconds  Findings:                 Two sessile polyps were found in the transverse                            colon and ascending colon. The polyps were 5 to 7                            mm in size. These polyps were removed with a cold                            snare. Resection and retrieval were complete.                           A 10 mm polyp was found in the sigmoid colon. The                            polyp was semi-pedunculated. The polyp was removed                            with a hot snare.  Resection and retrieval were                            complete.                           Multiple small and large-mouthed diverticula were                            found in the left colon.                           External and internal hemorrhoids were found. The                            hemorrhoids were small.                           The exam was otherwise without abnormality on                            direct and retroflexion views. Complications:            No immediate complications. Estimated blood loss:                            None. Estimated Blood Loss:     Estimated blood loss: none. Impression:               - Two 5 to 7 mm polyps in the transverse colon and                            in the ascending colon, removed with a cold snare.                            Resected and retrieved.                           - One 10 mm polyp in the sigmoid colon, removed                             with a hot snare. Resected and retrieved.                           - Diverticulosis in the left colon.                           - External and internal hemorrhoids.                           - The examination was otherwise normal on direct                            and retroflexion views. Recommendation:           - Patient has a contact number available for  emergencies. The signs and symptoms of potential                            delayed complications were discussed with the                            patient. Return to normal activities tomorrow.                            Written discharge instructions were provided to the                            patient.                           - Resume previous diet.                           - Continue present medications.                           - Await path results for final recommendations. Milus Banister, MD 03/24/2018 11:19:07 AM This report has been signed electronically.

## 2018-03-24 NOTE — Patient Instructions (Addendum)
**  Handouts given on polyps, diverticulosis and hemorrhoids**   YOU HAD AN ENDOSCOPIC PROCEDURE TODAY: Refer to the procedure report and other information in the discharge instructions given to you for any specific questions about what was found during the examination. If this information does not answer your questions, please call [place] office at [phone] to clarify.   YOU SHOULD EXPECT: Some feelings of bloating in the abdomen. Passage of more gas than usual. Walking can help get rid of the air that was put into your GI tract during the procedure and reduce the bloating. If you had a lower endoscopy (such as a colonoscopy or flexible sigmoidoscopy) you may notice spotting of blood in your stool or on the toilet paper. Some abdominal soreness may be present for a day or two, also.  DIET: Your first meal following the procedure should be a light meal and then it is ok to progress to your normal diet. A half-sandwich or bowl of soup is an example of a good first meal. Heavy or fried foods are harder to digest and may make you feel nauseous or bloated. Drink plenty of fluids but you should avoid alcoholic beverages for 24 hours. If you had a esophageal dilation, please see attached instructions for diet.    ACTIVITY: Your care partner should take you home directly after the procedure. You should plan to take it easy, moving slowly for the rest of the day. You can resume normal activity the day after the procedure however YOU SHOULD NOT DRIVE, use power tools, machinery or perform tasks that involve climbing or major physical exertion for 24 hours (because of the sedation medicines used during the test).   SYMPTOMS TO REPORT IMMEDIATELY: A gastroenterologist can be reached at any hour. Please call [phone]  for any of the following symptoms:  Following lower endoscopy (colonoscopy, flexible sigmoidoscopy) Excessive amounts of blood in the stool  Significant tenderness, worsening of abdominal  pains  Swelling of the abdomen that is new, acute  Fever of 100 or higher    FOLLOW UP:  If any biopsies were taken you will be contacted by phone or by letter within the next 1-3 weeks. Call [phone]  if you have not heard about the biopsies in 3 weeks.  Please also call with any specific questions about appointments or follow up tests.

## 2018-03-24 NOTE — Progress Notes (Signed)
Pt's states no medical or surgical changes since previsit or office visit. 

## 2018-03-25 ENCOUNTER — Telehealth: Payer: Self-pay

## 2018-03-25 NOTE — Telephone Encounter (Signed)
  Follow up Call-  Call back number 03/24/2018 01/13/2018  Post procedure Call Back phone  # [phone] hm  Permission to leave phone message Yes Yes  Some recent data might be hidden     Patient questions:  Do you have a fever, pain , or abdominal swelling? No. Pain Score  0 *  Have you tolerated food without any problems? Yes.    Have you been able to return to your normal activities? Yes.    Do you have any questions about your discharge instructions: Diet   No. Medications  No. Follow up visit  No.  Do you have questions or concerns about your Care? No.  Actions: * If pain score is 4 or above: No action needed, pain <4.

## 2018-03-30 ENCOUNTER — Encounter: Payer: Self-pay | Admitting: Gastroenterology

## 2018-04-05 ENCOUNTER — Encounter: Payer: Self-pay | Admitting: Gastroenterology

## 2018-04-05 ENCOUNTER — Telehealth: Payer: Self-pay | Admitting: Gastroenterology

## 2018-04-05 NOTE — Telephone Encounter (Signed)
Confirmed phone number and would like to please have nurse call back to explain some of the wording on his Colonoscopy result letter.

## 2018-04-05 NOTE — Telephone Encounter (Signed)
The pt has been advised of the information and verbalized understanding.    

## 2018-04-05 NOTE — Telephone Encounter (Signed)
No answer and no voice mail    Dear Mr. Hillmer,  At least one of the polyps removed during your recent procedure was proven to be adenomatous.  Usually, routine screening/surveillance colonoscopy would be recommended to be done in 3-5 years.  However since colon cancer screening tests generally stop between ages 76 and 60, I will leave it to you and your primary care physician to contact my office at that time if it is felt that colon cancer screening is still an important issue for you.   If you have any questions or concerns, please don't hesitate to call.  Sincerely,    Milus Banister, MD

## 2018-06-15 ENCOUNTER — Encounter: Payer: Self-pay | Admitting: Family Medicine

## 2018-06-15 ENCOUNTER — Ambulatory Visit (INDEPENDENT_AMBULATORY_CARE_PROVIDER_SITE_OTHER): Payer: Medicare Other | Admitting: Family Medicine

## 2018-06-15 VITALS — BP 114/64 | HR 52 | Temp 97.9°F | Ht 71.0 in | Wt 215.8 lb

## 2018-06-15 DIAGNOSIS — R031 Nonspecific low blood-pressure reading: Secondary | ICD-10-CM

## 2018-06-15 DIAGNOSIS — Z87891 Personal history of nicotine dependence: Secondary | ICD-10-CM | POA: Diagnosis not present

## 2018-06-15 DIAGNOSIS — I739 Peripheral vascular disease, unspecified: Secondary | ICD-10-CM | POA: Diagnosis not present

## 2018-06-15 DIAGNOSIS — E785 Hyperlipidemia, unspecified: Secondary | ICD-10-CM

## 2018-06-15 NOTE — Assessment & Plan Note (Signed)
Continue Crestor 10 mg daily.  Check lipid panel next blood draw. 

## 2018-06-15 NOTE — Assessment & Plan Note (Signed)
Congratulated patient on cessation.  Encouraged continued abstinence. ?

## 2018-06-15 NOTE — Assessment & Plan Note (Signed)
Continue aspirin and statin.  Continue exercise therapy.  Congratulated patient on smoking cessation.  He will follow-up with vascular surgery.  May be a candidate for Pletal-would defer to vascular.

## 2018-06-15 NOTE — Progress Notes (Signed)
   Subjective:  Bryan Armstrong is a 83 y.o. male who presents today with a chief complaint of low blood pressure and to transfer care to this office.  HPI:  Low blood pressure reading, new problem Patient had an episode of few days ago where he felt "off balance."  He checked his blood pressure and found to be in the 80s over 50s.  He increased his fluid intake and symptoms improved.  Denies any chest pain or shortness of breath.  No syncopal episodes.  No weakness or numbness.  He has a history of intermittent hypertension and takes Maxide for elevated blood pressures above 140.  He has not had to do this recently.  Varicose Veins / Claudication / PAD, chronic problems, stable Patient previously followed by vascular surgery. Last seen about 9 months ago. Has since quit smoking. Has been exercising much more. Still has pain after walking 10-15 minutes, however he has pain has improved. Symptoms seem to be improving.  He will be going back to vascular surgery soon.  COPD, chronic problem, stable On Advair.  Tolerating well.  No shortness of breath.  No wheeze.  ROS: Per HPI  PMH: He reports that he quit smoking about 8 months ago. His smoking use included cigarettes. He has a 20.00 pack-year smoking history. He has never used smokeless tobacco. He reports that he does not drink alcohol or use drugs.  Objective:  Physical Exam: BP 114/64 (BP Location: Left Arm, Patient Position: Sitting, Cuff Size: Normal)   Pulse (!) 52   Temp 97.9 F (36.6 C) (Oral)   Ht 5\' 11"  (1.803 m)   Wt 215 lb 12.8 oz (97.9 kg)   SpO2 98%   BMI 30.10 kg/m   Gen: NAD, resting comfortably CV: RRR with no murmurs appreciated.  No  carotid bruits. Pulm: NWOB, CTAB with no crackles, wheezes, or rhonchi GI: Normal bowel sounds present. Soft, Nontender, Nondistended. MSK: Bilateral varicose veins noted in lower extremities.  Assessment/Plan:  Dyslipidemia Continue Crestor 10 mg daily.  Check lipid panel next  blood draw.  PAD (peripheral artery disease) (HCC) Continue aspirin and statin.  Continue exercise therapy.  Congratulated patient on smoking cessation.  He will follow-up with vascular surgery.  May be a candidate for Pletal-would defer to vascular.  Former smoker Congratulated patient on cessation.  Encouraged continued abstinence.  Low blood pressure reading Unclear etiology.  No red flag signs or symptoms.  Blood pressure at goal today.  Symptoms possibly due to dehydration as patient reports he was out playing golf and had quite a bit of sweating.  Encourage patient to increase his fluid intake, especially when out to eat.  If he persistently has low blood pressure readings, would consider referral to cardiology for further evaluation.  Discussed reasons to return to care including chest pain, shortness of breath, and syncope.  Algis Greenhouse. Jerline Pain, MD 06/15/2018 12:01 PM

## 2018-09-27 ENCOUNTER — Ambulatory Visit: Payer: Medicare Other | Admitting: Family

## 2018-09-27 ENCOUNTER — Encounter (HOSPITAL_COMMUNITY): Payer: Medicare Other

## 2018-09-28 ENCOUNTER — Ambulatory Visit (INDEPENDENT_AMBULATORY_CARE_PROVIDER_SITE_OTHER): Payer: Medicare Other | Admitting: Pulmonary Disease

## 2018-09-28 ENCOUNTER — Ambulatory Visit: Payer: Medicare Other | Admitting: Pulmonary Disease

## 2018-09-28 ENCOUNTER — Encounter: Payer: Self-pay | Admitting: Pulmonary Disease

## 2018-09-28 VITALS — BP 134/74 | HR 66 | Ht 71.0 in | Wt 223.6 lb

## 2018-09-28 DIAGNOSIS — Z87891 Personal history of nicotine dependence: Secondary | ICD-10-CM

## 2018-09-28 DIAGNOSIS — J449 Chronic obstructive pulmonary disease, unspecified: Secondary | ICD-10-CM

## 2018-09-28 DIAGNOSIS — K219 Gastro-esophageal reflux disease without esophagitis: Secondary | ICD-10-CM

## 2018-09-28 DIAGNOSIS — J301 Allergic rhinitis due to pollen: Secondary | ICD-10-CM | POA: Diagnosis not present

## 2018-09-28 MED ORDER — FLUTICASONE-SALMETEROL 250-50 MCG/DOSE IN AEPB: INHALATION_SPRAY | RESPIRATORY_TRACT | 6 refills | Status: DC

## 2018-09-28 NOTE — Assessment & Plan Note (Signed)
Continue to not smoke 

## 2018-09-28 NOTE — Patient Instructions (Addendum)
We have refilled your Advair, take as prescribed >>>Rinse mouth out after use  Note your daily symptoms > remember "red flags" for COPD:   >>>Increase in cough >>>increase in sputum production >>>increase in shortness of breath or activity  intolerance.   If you notice these symptoms, please call the office to be seen.   Consider starting daily antihistamine during fall season with allergies    Please start taking a daily during the fall antihistamine:  >>>choose one of: zyrtec, claritin, allegra, or xyzal  >>>these are over the counter medications  >>>can choose generic option  >>>take daily  >>>this medication helps with allergies, post nasal drip, and cough    We will recheck your blood pressure if it continues to be elevated contact primary care  GERD management: >>>Avoid laying flat until 2 hours after meals >>>Elevate head of the bed including entire chest >>>Reduce size of meals and amount of fat, acid, spices, caffeine and sweets >>>If you are smoking, Please stop! >>>Decrease alcohol consumption >>>Work on maintaining a healthy weight with normal BMI     Follow up in 1 year or sooner with issues    It is flu season:   >>>Remember to be washing your hands regularly, using hand sanitizer, be careful to use around herself with has contact with people who are sick will increase her chances of getting sick yourself. >>> Best ways to protect herself from the flu: Receive the yearly flu vaccine, practice good hand hygiene washing with soap and also using hand sanitizer when available, eat a nutritious meals, get adequate rest, hydrate appropriately   Please contact the office if your symptoms worsen or you have concerns that you are not improving.   Thank you for choosing [facility] for your healthcare, and for allowing [place] to partner with you on your healthcare journey. I am thankful to be able to provide care to you today.   Wyn Quaker FNP-C

## 2018-09-28 NOTE — Assessment & Plan Note (Signed)
We have refilled your Advair, take as prescribed >>>Rinse mouth out after use  Note your daily symptoms > remember "red flags" for COPD:   >>>Increase in cough >>>increase in sputum production >>>increase in shortness of breath or activity  intolerance.   If you notice these symptoms, please call the office to be seen.   Consider starting daily antihistamine during fall season with allergies    Please start taking a daily during the fall antihistamine:  >>>choose one of: zyrtec, claritin, allegra, or xyzal  >>>these are over the counter medications  >>>can choose generic option  >>>take daily  >>>this medication helps with allergies, post nasal drip, and cough    We will recheck your blood pressure if it continues to be elevated contact primary care  Follow up in 1 year or sooner with issues

## 2018-09-28 NOTE — Progress Notes (Signed)
@Patient  ID: Bryan Armstrong, male    DOB: [birth year], 83 y.o.   MRN: [id number]  Chief Complaint  Patient presents with  . Follow-up    COPD     Referring provider: Vivi Barrack, MD  HPI:  83 year old male former smoker followed in our office for COPD Gold 1A.   PMH: Hypertension, PAD Smoker/ Smoking History: Former smoker.  Quit 2018.  20-pack-year smoking history. Maintenance: Advair 250 Pt of: Dr. Vaughan Browner  Recent [facility] Encounters:   Last seen in our office in 2018  09/28/2018  - Visit   83 year old male patient presenting today for follow-up with our office.  Patient has been doing well over the last year and has not needed to use his rescue inhaler at all.  Patient has been using his Advair as needed about 5 times a week as patient has been asymptomatic.  Patient does report that in the fall he typically has more allergy-like symptoms and requires Advair more regularly then.  Patient reports that breathing has been significantly better since stopping smoking in 2018.  MMRC - Breathlessness Score 0 - I will get breathless with strenuous exercise  Patient is up-to-date with flu vaccine as well as pneumonia vaccine.    Tests:  08/08/2011-CT maxillofacial without contrast- minimal mucosal thickening in left maxillary antrum  01/06/2005-pulmonary function test-FVC 4.5 (96% predicted), postbronchodilator ratio of 67, FEV1 102, DLCO 132  Chest x-ray [08/09/11] No acute cardiopulmonary abnormalities. Images reviewed  CBC 03/14/25-JFHLKTGY eosinophilic count 563   FENO:  No results found for: NITRICOXIDE  PFT: No flowsheet data found.  Imaging: No results found.  Chart Review:    Specialty Problems      Pulmonary Problems   COPD, group A, by GOLD 2017 classification ([place])    PFT"s 2006:  FEV1 2.85 (89%), ratio 63, no restriction, +airtrapping, DLCO normal.       Allergic rhinitis      No Known Allergies  Immunization History    Administered Date(s) Administered  . Influenza Split 07/11/2016  . Influenza Whole 08/15/2002, 09/21/2007, 08/03/2008, 08/08/2009, 08/01/2010  . Influenza, High Dose Seasonal PF 06/20/2014, 08/30/2018  . Influenza,inj,Quad PF,6+ Mos 06/28/2013, 08/28/2015  . Influenza-Unspecified 08/14/2011, 07/18/2017  . Pneumococcal Conjugate-13 10/23/2015  . Pneumococcal Polysaccharide-23 10/14/2002  . Td 07/13/2001  . Tdap 07/14/2011  . Zoster 06/14/2015    Past Medical History:  Diagnosis Date  . BRADYCARDIA 06/01/2007  . Cataract    bil cataracts removed  . COPD (chronic obstructive pulmonary disease) ([place])   . EMPHYSEMA 03/28/2008  . Emphysema of lung ([place])   . HYPERGLYCEMIA 06/01/2007  . HYPERLIPIDEMIA 06/01/2007  . Hypertension   . HYPERTENSION 01/19/2009    Tobacco History: Social History   Tobacco Use  Smoking Status Former Smoker  . Packs/day: 0.50  . Years: 40.00  . Pack years: 20.00  . Types: Cigarettes  . Last attempt to quit: 09/26/2017  . Years since quitting: 1.0  Smokeless Tobacco Never Used  Tobacco Comment   stopped 2018   Counseling given: Yes Comment: stopped 2018   Outpatient Encounter Medications as of 09/28/2018  Medication Sig  . albuterol (PROVENTIL HFA;VENTOLIN HFA) 108 (90 Base) MCG/ACT inhaler Inhale 2 puffs into the lungs every 6 (six) hours as needed for wheezing or shortness of breath.  Marland Kitchen aspirin 81 MG tablet 1/2 tablet by mouth daily  . Fluticasone-Salmeterol (ADVAIR DISKUS) 250-50 MCG/DOSE AEPB inhale 1 dose by mouth twice a day  . rosuvastatin (CRESTOR) 10 MG tablet  Take 1 tablet (10 mg total) by mouth daily.  Marland Kitchen triamterene-hydrochlorothiazide (MAXZIDE-25) 37.5-25 MG tablet Take 0.5 tablets by mouth daily.  . [DISCONTINUED] Fluticasone-Salmeterol (ADVAIR DISKUS) 250-50 MCG/DOSE AEPB inhale 1 dose by mouth twice a day   No facility-administered encounter medications on file as of 09/28/2018.      Review of Systems  Review of Systems   Constitutional: Negative for activity change, chills, fatigue, fever and unexpected weight change.  HENT: Negative for congestion, postnasal drip, rhinorrhea, sinus pressure, sinus pain and sore throat.   Eyes: Negative.   Respiratory: Negative for cough, shortness of breath and wheezing.   Cardiovascular: Negative for chest pain and palpitations.  Gastrointestinal: Negative for constipation, diarrhea, nausea and vomiting.  Endocrine: Negative.   Musculoskeletal: Negative.   Skin: Negative.   Allergic/Immunologic: Positive for environmental allergies (fall is usally trigger ).  Neurological: Negative for dizziness and headaches.  Psychiatric/Behavioral: Negative.  Negative for dysphoric mood. The patient is not nervous/anxious.   All other systems reviewed and are negative.    Physical Exam  BP 134/74 (BP Location: Left Arm, Cuff Size: Normal)   Pulse 66   Ht 5\' 11"  (1.803 m)   Wt 223 lb 9.6 oz (101.4 kg)   SpO2 93%   BMI 31.19 kg/m   Wt Readings from Last 5 Encounters:  09/28/18 223 lb 9.6 oz (101.4 kg)  06/15/18 215 lb 12.8 oz (97.9 kg)  03/24/18 211 lb (95.7 kg)  03/11/18 211 lb (95.7 kg)  01/13/18 216 lb (98 kg)   >>> will recheck BP  - 134/74 at discharge  Physical Exam  Constitutional: He is oriented to person, place, and time and well-developed, well-nourished, and in no distress. No distress.  HENT:  Head: Normocephalic and atraumatic.  Right Ear: Hearing, tympanic membrane, external ear and ear canal normal.  Left Ear: Hearing, tympanic membrane, external ear and ear canal normal.  Nose: Mucosal edema present. Right sinus exhibits no maxillary sinus tenderness and no frontal sinus tenderness. Left sinus exhibits no maxillary sinus tenderness and no frontal sinus tenderness.  Mouth/Throat: Uvula is midline and oropharynx is clear and moist. No oropharyngeal exudate.  +pnd  Eyes: Pupils are equal, round, and reactive to light.  Neck: Normal range of motion. Neck  supple.  Cardiovascular: Normal rate, regular rhythm and normal heart sounds.  Pulmonary/Chest: Effort normal and breath sounds normal. No accessory muscle usage. No respiratory distress. He has no decreased breath sounds. He has no wheezes. He has no rhonchi.  Musculoskeletal: Normal range of motion.        General: No edema.  Lymphadenopathy:    He has no cervical adenopathy.  Neurological: He is alert and oriented to person, place, and time. Gait normal.  Skin: Skin is warm and dry. He is not diaphoretic. No erythema.  Psychiatric: Mood, memory, affect and judgment normal.  Nursing note and vitals reviewed.    Lab Results:  CBC    Component Value Date/Time   WBC 8.9 12/15/2017 1458   RBC 4.51 12/15/2017 1458   HGB 13.5 12/15/2017 1458   HCT 39.9 12/15/2017 1458   PLT 188.0 12/15/2017 1458   MCV 88.5 12/15/2017 1458   MCH 29.8 08/10/2011 0558   MCHC 33.9 12/15/2017 1458   RDW 14.1 12/15/2017 1458   LYMPHSABS 1.7 12/15/2017 1458   MONOABS 0.6 12/15/2017 1458   EOSABS 0.4 12/15/2017 1458   BASOSABS 0.1 12/15/2017 1458    BMET    Component Value Date/Time   NA  138 12/15/2017 1458   K 4.6 12/15/2017 1458   CL 103 12/15/2017 1458   CO2 31 12/15/2017 1458   GLUCOSE 98 12/15/2017 1458   BUN 22 12/15/2017 1458   CREATININE 1.05 12/15/2017 1458   CALCIUM 9.1 12/15/2017 1458   GFRNONAA 83 (L) 08/10/2011 0558   GFRAA >90 08/10/2011 0558    BNP No results found for: BNP  ProBNP    Component Value Date/Time   PROBNP 162.5 (H) 08/09/2011 0455      Assessment & Plan:   83 year old male patient completing follow-up with our office today.  Patient is doing quite well since stopping smoking.  We will refill patient's Advair.  I am okay with the patient using Advair once a day as needed for shortness of breath and dyspnea.  Patient knows that if shortness of breath increases he needs to resume Advair dosing as prescribed.  I do believe the patient would benefit from a  daily antihistamine at least during the fall season when patient has known worsened allergic rhinitis and allergy symptoms.  Praised patient for continue to not smoke.  Patient is up-to-date on flu vaccine as well as pneumonia vaccine.  Patient to follow-up in 1 year or sooner if symptoms worsen.  COPD, group A, by GOLD 2017 classification ([place]) We have refilled your Advair, take as prescribed >>>Rinse mouth out after use  Note your daily symptoms > remember "red flags" for COPD:   >>>Increase in cough >>>increase in sputum production >>>increase in shortness of breath or activity  intolerance.   If you notice these symptoms, please call the office to be seen.   Consider starting daily antihistamine during fall season with allergies    Please start taking a daily during the fall antihistamine:  >>>choose one of: zyrtec, claritin, allegra, or xyzal  >>>these are over the counter medications  >>>can choose generic option  >>>take daily  >>>this medication helps with allergies, post nasal drip, and cough    We will recheck your blood pressure if it continues to be elevated contact primary care  Follow up in 1 year or sooner with issues   Allergic rhinitis  Consider starting daily antihistamine during fall season with allergies    Please start taking a daily during the fall antihistamine:  >>>choose one of: zyrtec, claritin, allegra, or xyzal  >>>these are over the counter medications  >>>can choose generic option  >>>take daily  >>>this medication helps with allergies, post nasal drip, and cough    GERD (gastroesophageal reflux disease)  GERD management: >>>Avoid laying flat until 2 hours after meals >>>Elevate head of the bed including entire chest >>>Reduce size of meals and amount of fat, acid, spices, caffeine and sweets >>>If you are smoking, Please stop! >>>Decrease alcohol consumption >>>Work on maintaining a healthy weight with normal BMI    Former  smoker Continue to not smoke  This appointment was 26 minutes along with over 50% of the time direct face-to-face patient care, assessment, plan of care follow-up and discussion.   Lauraine Rinne, NP 09/28/2018

## 2018-09-28 NOTE — Assessment & Plan Note (Signed)
  GERD management: >>>Avoid laying flat until 2 hours after meals >>>Elevate head of the bed including entire chest >>>Reduce size of meals and amount of fat, acid, spices, caffeine and sweets >>>If you are smoking, Please stop! >>>Decrease alcohol consumption >>>Work on maintaining a healthy weight with normal BMI

## 2018-09-28 NOTE — Assessment & Plan Note (Signed)
  Consider starting daily antihistamine during fall season with allergies    Please start taking a daily during the fall antihistamine:  >>>choose one of: zyrtec, claritin, allegra, or xyzal  >>>these are over the counter medications  >>>can choose generic option  >>>take daily  >>>this medication helps with allergies, post nasal drip, and cough

## 2018-09-30 ENCOUNTER — Encounter: Payer: Self-pay | Admitting: Family

## 2018-09-30 ENCOUNTER — Ambulatory Visit (HOSPITAL_COMMUNITY)
Admission: RE | Admit: 2018-09-30 | Discharge: 2018-09-30 | Disposition: A | Discharge status: Home / Self Care | Payer: Medicare Other | Source: Ambulatory Visit | Attending: Family | Admitting: Family

## 2018-09-30 ENCOUNTER — Ambulatory Visit: Payer: Medicare Other | Admitting: Family

## 2018-09-30 VITALS — BP 120/71 | HR 67 | Temp 97.5°F | Resp 16 | Ht 71.0 in | Wt 214.0 lb

## 2018-09-30 DIAGNOSIS — I779 Disorder of arteries and arterioles, unspecified: Secondary | ICD-10-CM | POA: Diagnosis not present

## 2018-09-30 DIAGNOSIS — F17200 Nicotine dependence, unspecified, uncomplicated: Secondary | ICD-10-CM | POA: Diagnosis present

## 2018-09-30 DIAGNOSIS — Z87891 Personal history of nicotine dependence: Secondary | ICD-10-CM

## 2018-09-30 DIAGNOSIS — I872 Venous insufficiency (chronic) (peripheral): Secondary | ICD-10-CM

## 2018-09-30 DIAGNOSIS — I70219 Atherosclerosis of native arteries of extremities with intermittent claudication, unspecified extremity: Secondary | ICD-10-CM | POA: Diagnosis not present

## 2018-09-30 MED ORDER — CILOSTAZOL 100 MG PO TABS: 100.0000 mg | ORAL_TABLET | Freq: Two times a day (BID) | ORAL | 11 refills | Status: DC

## 2018-09-30 NOTE — Progress Notes (Signed)
[facility]   CC: Follow up peripheral artery occlusive disease  History of Present Illness Bryan Armstrong is a 83 y.o. male who has been having some calf pain bilaterally and has significant varicose veins. He was self-referred for evaluation of venous disease. He initially had pain in both calves which was brought on by ambulation and relieved with rest. His symptoms were more significant on the right side. His symptoms began early in 2018 and had been gradually progressive. He denies any significant thigh or hip claudication. He denies any history of rest pain or nonhealing ulcers. He does not experience symptoms when he is simply standing. He is unaware of any previous history of DVT or phlebitis.  He golfs, and states that his his symptoms in his legs have improved since he was evaluated by Dr. Scot Dock in May 2018.   His risk factors for peripheral vascular disease include hypertension, hyperlipidemia, and tobacco use. He denies any history of diabetes or family history of premature cardiovascular disease.  Dr. Scot Dock last evaluated pt on 03-12-17. At that time right lower extremity venous duplex showed no right lower extremity DVT or superficial thrombophlebitis. He did have reflux in the deep system involving the common femoral vein and femoral vein. He had reflux in the superficial system involving the right saphenofemoral junction and right great saphenous vein. In addition he had some reflux in the short saphenous vein on the right. In regards to PAD, based on his history, Dr. Scot Dock thought that his symptoms in both calves were related to peripheral vascular disease and not related to his chronic venous insufficiency. He did not experience symptoms when he is simply standing. He had evidence of infrainguinal arterial occlusive disease on exam. Dr. Scot Dock discussed the importance of staying off of tobacco, encouraged him to get on a structured walking  program, and the importance of nutrition. If his symptoms progress or become disabling, then certainly we can consider arteriography. Dr. Scot Dock discussed the indications for this and the potential complications. He advised pt to follow up with ABIs in 6 months, call sooner if he has problems. In regards to chronic venous insufficiency, based on his exam and duplex study, pt did have significant chronic venous insufficiency, discussed the importance of intermittent leg elevation in the proper positioning for this. Given his peripheral vascular disease, Dr. Scot Dock would not recommend aggressive compression therapy, and encouraged him to stay as active as possible and try to avoid prolonged sitting and standing.  After walking about 20 minutes both calves start to feel tight and painful, relieved by rest. He feels that this is an improvement.   He denies any known hx of stroke or TIA.   Diabetic: No Tobacco use: former smoker  (quit 09-26-17, started about age 71 yrs). He states his breathing has improved a great deal since he stopped smoking.   Pt meds include: Statin :Yes Betablocker: No ASA: Yes Other anticoagulants/antiplatelets: no    Past Medical History:  Diagnosis Date  . BRADYCARDIA 06/01/2007  . Cataract    bil cataracts removed  . COPD (chronic obstructive pulmonary disease) ([place])   . EMPHYSEMA 03/28/2008  . Emphysema of lung ([place])   . HYPERGLYCEMIA 06/01/2007  . HYPERLIPIDEMIA 06/01/2007  . Hypertension   . HYPERTENSION 01/19/2009    Social History Social History   Tobacco Use  . Smoking status: Former Smoker    Packs/day: 0.50    Years: 40.00    Pack years: 20.00  Types: Cigarettes    Last attempt to quit: 09/26/2017    Years since quitting: 1.0  . Smokeless tobacco: Never Used  . Tobacco comment: stopped 2018  Substance Use Topics  . Alcohol use: No  . Drug use: No    Family History Family History  Problem Relation Age of Onset  . Cancer Mother         "Male" cancer  . Uterine cancer Mother   . Heart disease Father   . Colon cancer Neg Hx   . Esophageal cancer Neg Hx   . Rectal cancer Neg Hx   . Stomach cancer Neg Hx   . Liver cancer Neg Hx   . Pancreatic cancer Neg Hx   . Prostate cancer Neg Hx     Past Surgical History:  Procedure Laterality Date  . COLONOSCOPY    . POLYPECTOMY    . TONSILLECTOMY AND ADENOIDECTOMY      No Known Allergies  Current Outpatient Medications  Medication Sig Dispense Refill  . aspirin 81 MG tablet 1/2 tablet by mouth daily    . Fluticasone-Salmeterol (ADVAIR DISKUS) 250-50 MCG/DOSE AEPB inhale 1 dose by mouth twice a day 3 each 6  . rosuvastatin (CRESTOR) 10 MG tablet Take 1 tablet (10 mg total) by mouth daily. 90 tablet 3  . triamterene-hydrochlorothiazide (MAXZIDE-25) 37.5-25 MG tablet Take 0.5 tablets by mouth daily. 30 tablet 5   No current facility-administered medications for this visit.     ROS: See HPI for pertinent positives and negatives.   Physical Examination  Vitals:   09/30/18 1432  BP: 120/71  Pulse: 67  Resp: 16  Temp: (!) 97.5 F (36.4 C)  SpO2: 98%  Weight: 214 lb (97.1 kg)  Height: 5\' 11"  (1.803 m)   Body mass index is 29.85 kg/m.  General: A&O x 3, WDWN, male. Gait: normal Eyes: PERRLA. Pulmonary: Respirations are non labored, adequate air movement in all fields, no rales, rhonchi, or wheezes.  Cardiac: Irregular, then regular rhythm with controlled rate, no detected murmur.         Carotid Bruits Right Left   Negative Negative   Radial pulses are 2+ palpable bilaterally   Adominal aortic pulse is not palpable                         VASCULAR EXAM: Extremities without ischemic changes, without gangrene; without open wounds.                                                                                                                                                       LE Pulses Right Left       FEMORAL  2+ palpable  3+ palpable         POPLITEAL  not palpable   not palpable  POSTERIOR TIBIAL  not palpable   not palpable        DORSALIS PEDIS      ANTERIOR TIBIAL not palpable  not palpable    Abdomen: soft, NT, no palpable masses. Skin: no rashes, no cellulitis, no ulcers noted. Multiple white small dry papular lesions on feet and ankles. Musculoskeletal: no muscle wasting or atrophy.  Neurologic: A&O X 3; appropriate affect, Sensation is normal; MOTOR FUNCTION:  moving all extremities equally, motor strength 5/5 throughout. Speech is fluent/normal. CN 2-12 intact. Psychiatric: Thought content is normal, mood appropriate for clinical situation.     ASSESSMENT: Bryan Armstrong is a 83 y.o. male who was initially evaluated for bilateral calf pain, worse with ambulation. He has venous reflux on venous duplex, only the right leg was studied, no DVT in the right leg (03-12-17).  He has moderate claudication in his calves, not life limiting, is playing golf, no edema in his feet or legs, no heavy feeling in his legs, no ulcers or signs of ischemia.   His atherosclerotic risk factors include former smoker x 50 years (quit in December 2018), COPD, and dyslipidemia. He takes a statin and ASA daily.    He asked if there was a medication that could help his legs circulation; we discussed Pletal; he wants to try, prescription sent to his pharmacy. I discussed possible side effects with him, and if they are too bothersome to him, stop taking the medication.   He denies feeling light headed, denies dyspnea or chest pain. He has no known cardiac arrhythmias, his cardiac rhythm now is irregular, then regular.  ECG in March 2019 demonstrated sinus bradycardia with right bundle branch block and left axis -bifascicular block.  Pt states he will discuss his asymptomatic cardiac arrhythmia with his PCP.    DATA  ABI Findings (09-30-18): +---------+------------------+-----+----------+--------+ Right    Rt Pressure  (mmHg)IndexWaveform  Comment  +---------+------------------+-----+----------+--------+ Brachial 156                                       +---------+------------------+-----+----------+--------+ PTA      101               0.64 monophasic         +---------+------------------+-----+----------+--------+ DP       91                0.58 monophasic         +---------+------------------+-----+----------+--------+ Great Toe80                0.51 Abnormal           +---------+------------------+-----+----------+--------+  +---------+------------------+-----+----------+-------+ Left     Lt Pressure (mmHg)IndexWaveform  Comment +---------+------------------+-----+----------+-------+ Brachial 157                                      +---------+------------------+-----+----------+-------+ PTA      100               0.64 monophasic        +---------+------------------+-----+----------+-------+ DP       96                0.61 monophasic        +---------+------------------+-----+----------+-------+ Great Toe76                0.48  Abnormal          +---------+------------------+-----+----------+-------+  +-------+-----------+-----------+------------+------------+ ABI/TBIToday's ABIToday's TBIPrevious ABIPrevious TBI +-------+-----------+-----------+------------+------------+ Right  0.64       0.51       0.70        0.52         +-------+-----------+-----------+------------+------------+ Left   0.64       0.48       0.70        0.55         +-------+-----------+-----------+------------+------------+ Slight decline in bilateral ABI, moderate disease with monophasic waveforms bilaterally. Prior study on 09/23/17.  Bilateral PPG tracings appear dampened.      PLAN:  Based on the patient's vascular studies and examination, pt will return to clinic in 6 months with ABI's.  I advised pt to notify us if he develops concerns  re the circulation in his feet or legs.   I discussed in depth with the patient the nature of atherosclerosis, and emphasized the importance of maximal medical management including strict control of blood pressure, blood glucose, and lipid levels, obtaining regular exercise, and continued cessation of smoking.  The patient is aware that without maximal medical management the underlying atherosclerotic disease process will progress, limiting the benefit of any interventions.  The patient was given information about PAD including signs, symptoms, treatment, what symptoms should prompt the patient to seek immediate medical care, and risk reduction measures to take.  Clemon Chambers, RN, MSN, FNP-C [facility] Phone: [phone]  Clinic MD: [facility]  09/30/18 2:44 PM

## 2018-09-30 NOTE — Patient Instructions (Signed)
Peripheral Vascular Disease  Peripheral vascular disease (PVD) is a disease of the blood vessels that are not part of your heart and brain. A simple term for PVD is poor circulation. In most cases, PVD narrows the blood vessels that carry blood from your heart to the rest of your body. This can reduce the supply of blood to your arms, legs, and internal organs, like your stomach or kidneys. However, PVD most often affects a person's lower legs and feet. Without treatment, PVD tends to get worse. PVD can also lead to acute ischemic limb. This is when an arm or leg suddenly cannot get enough blood. This is a medical emergency. Follow these instructions at home: Lifestyle  Do not use any products that contain nicotine or tobacco, such as cigarettes and e-cigarettes. If you need help quitting, ask your doctor.  Lose weight if you are overweight. Or, stay at a healthy weight as told by your doctor.  Eat a diet that is low in fat and cholesterol. If you need help, ask your doctor.  Exercise regularly. Ask your doctor for activities that are right for you. General instructions  Take over-the-counter and prescription medicines only as told by your doctor.  Take good care of your feet: ? Wear comfortable shoes that fit well. ? Check your feet often for any cuts or sores.  Keep all follow-up visits as told by your doctor This is important. Contact a doctor if:  You have cramps in your legs when you walk.  You have leg pain when you are at rest.  You have coldness in a leg or foot.  Your skin changes.  You are unable to get or have an erection (erectile dysfunction).  You have cuts or sores on your feet that do not heal. Get help right away if:  Your arm or leg turns cold, numb, and blue.  Your arms or legs become red, warm, swollen, painful, or numb.  You have chest pain.  You have trouble breathing.  You suddenly have weakness in your face, arm, or leg.  You become very  confused or you cannot speak.  You suddenly have a very bad headache.  You suddenly cannot see. Summary  Peripheral vascular disease (PVD) is a disease of the blood vessels.  A simple term for PVD is poor circulation. Without treatment, PVD tends to get worse.  Treatment may include exercise, low fat and low cholesterol diet, and quitting smoking. This information is not intended to replace advice given to you by your health care provider. Make sure you discuss any questions you have with your health care provider. Document Released: 12/24/2009 Document Revised: 11/06/2016 Document Reviewed: 11/06/2016 Elsevier Interactive Patient Education  2019 [employer]  

## 2018-12-17 ENCOUNTER — Telehealth: Payer: Self-pay | Admitting: Endocrinology

## 2018-12-17 NOTE — Telephone Encounter (Signed)
Patient called after hours service and returned patients call this morning in regards to an appointment. Patient cancelled 1 year follow up for hyperglycemia on the phone system. Patient thought Dr.Ellison was no longer a doctor. Confirmed with patient Dr.Ellison could still see him as his endocrine specialist. He did not want to reschedule until he spoke with a nurse due to not understanding why he see's Dr.Ellison.  Explained he was seen for Hyperglycemia, he asked what it was. I stated the nurse will call to further explain.  Please Advise, Thanks

## 2018-12-17 NOTE — Telephone Encounter (Signed)
Returned pt call. Clarified his concern and answered all questions. At this time, it does not appear pt will be required to keep appt as he is no longer providing primary care. Pt is aware, should anything change from an endocrinology stand point, Dr. Loanne Drilling will be happy to see him. Verbalized understanding.

## 2018-12-21 ENCOUNTER — Ambulatory Visit: Payer: Medicare Other | Admitting: Endocrinology

## 2018-12-30 ENCOUNTER — Other Ambulatory Visit: Payer: Self-pay

## 2018-12-30 ENCOUNTER — Encounter: Payer: Medicare Other | Admitting: Family Medicine

## 2018-12-30 ENCOUNTER — Ambulatory Visit (INDEPENDENT_AMBULATORY_CARE_PROVIDER_SITE_OTHER): Payer: Medicare Other | Admitting: Family Medicine

## 2018-12-30 ENCOUNTER — Telehealth: Payer: Self-pay | Admitting: Family Medicine

## 2018-12-30 ENCOUNTER — Encounter: Payer: Self-pay | Admitting: Family Medicine

## 2018-12-30 VITALS — BP 191/94 | HR 66 | Temp 97.7°F | Ht 71.0 in | Wt 225.2 lb

## 2018-12-30 DIAGNOSIS — J449 Chronic obstructive pulmonary disease, unspecified: Secondary | ICD-10-CM

## 2018-12-30 DIAGNOSIS — I10 Essential (primary) hypertension: Secondary | ICD-10-CM

## 2018-12-30 DIAGNOSIS — Z0001 Encounter for general adult medical examination with abnormal findings: Secondary | ICD-10-CM | POA: Diagnosis not present

## 2018-12-30 DIAGNOSIS — E785 Hyperlipidemia, unspecified: Secondary | ICD-10-CM

## 2018-12-30 DIAGNOSIS — M542 Cervicalgia: Secondary | ICD-10-CM

## 2018-12-30 DIAGNOSIS — I739 Peripheral vascular disease, unspecified: Secondary | ICD-10-CM

## 2018-12-30 DIAGNOSIS — Z6831 Body mass index (BMI) 31.0-31.9, adult: Secondary | ICD-10-CM | POA: Diagnosis not present

## 2018-12-30 LAB — COMPREHENSIVE METABOLIC PANEL
ALK PHOS: 53 U/L (ref 39–117)
ALT: 21 U/L (ref 0–53)
AST: 21 U/L (ref 0–37)
Albumin: 4.5 g/dL (ref 3.5–5.2)
BUN: 18 mg/dL (ref 6–23)
CO2: 29 mEq/L (ref 19–32)
CREATININE: 0.97 mg/dL (ref 0.40–1.50)
Calcium: 9.2 mg/dL (ref 8.4–10.5)
Chloride: 102 mEq/L (ref 96–112)
GFR: 74.08 mL/min (ref >60.00)
Glucose, Bld: 110 mg/dL — ABNORMAL HIGH (ref 70–99)
Potassium: 4.3 mEq/L (ref 3.5–5.1)
Sodium: 138 mEq/L (ref 135–145)
TOTAL PROTEIN: 7.2 g/dL (ref 6.0–8.3)
Total Bilirubin: 0.6 mg/dL (ref 0.2–1.2)

## 2018-12-30 LAB — LIPID PANEL
Cholesterol: 133 mg/dL (ref 0–200)
HDL: 47.3 mg/dL (ref >39.00)
LDL Cholesterol: 71 mg/dL (ref 0–99)
NonHDL: 85.47
Total CHOL/HDL Ratio: 3
Triglycerides: 72 mg/dL (ref 0.0–149.0)
VLDL: 14.4 mg/dL (ref 0.0–40.0)

## 2018-12-30 LAB — CBC
HCT: 43.4 % (ref 39.0–52.0)
Hemoglobin: 14.6 g/dL (ref 13.0–17.0)
MCHC: 33.7 g/dL (ref 30.0–36.0)
MCV: 88.4 fl (ref 78.0–100.0)
Platelets: 214 10*3/uL (ref 150.0–400.0)
RBC: 4.91 Mil/uL (ref 4.22–5.81)
RDW: 13.9 % (ref 11.5–15.5)
WBC: 7 10*3/uL (ref 4.0–10.5)

## 2018-12-30 LAB — TSH: TSH: 2.46 u[IU]/mL (ref 0.35–4.50)

## 2018-12-30 MED ORDER — LOSARTAN POTASSIUM 50 MG PO TABS: 50.0000 mg | ORAL_TABLET | Freq: Every day | ORAL | 3 refills | Status: DC

## 2018-12-30 MED ORDER — TIZANIDINE HCL 2 MG PO CAPS: 2.0000 mg | ORAL_CAPSULE | Freq: Three times a day (TID) | ORAL | 0 refills | Status: DC | PRN

## 2018-12-30 NOTE — Assessment & Plan Note (Signed)
Well above goal today.  No chest pain or shortness of breath.  Will start daily losartan 50 mg daily.  He will follow-up with me in a few weeks.  Discussed reasons to return to care earlier.  Discussed home blood pressure monitoring with goal 150/90 or lower.  Check CBC, CMET, and TSH.

## 2018-12-30 NOTE — Telephone Encounter (Signed)
Noted  

## 2018-12-30 NOTE — Telephone Encounter (Signed)
Patient would like a copy of his lab results from 12/30/18 visit mailed to him. Thank you!

## 2018-12-30 NOTE — Assessment & Plan Note (Signed)
Continue Crestor 10 mg daily.  Check lipid panel.

## 2018-12-30 NOTE — Progress Notes (Signed)
Chief Complaint:  Bryan Armstrong is a 84 y.o. male who presents today for his annual comprehensive physical exam.    Assessment/Plan:  PAD (peripheral artery disease) (HCC) Continue aspirin, Pletal, and Crestor.  Continue exercise therapy.  HTN (hypertension) Well above goal today.  No chest pain or shortness of breath.  Will start daily losartan 50 mg daily.  He will follow-up with me in a few weeks.  Discussed reasons to return to care earlier.  Discussed home blood pressure monitoring with goal 150/90 or lower.  Check CBC, CMET, and TSH.  Dyslipidemia Continue Crestor 10 mg daily.  Check lipid panel.  COPD, group A, by GOLD 2017 classification ([place]) Stable.  Continue Advair.  Neck pain No red flags.  Start Zanaflex 2 mg 3 times daily as needed.  Discussed potential side effects.  Preventative Healthcare: UTD on vaccines and screenings.   BMI 31 Discussed lifestyle modifications.   Patient Counseling(The following topics were reviewed and/or handout was given):  -Nutrition: Stressed importance of moderation in sodium/caffeine intake, saturated fat and cholesterol, caloric balance, sufficient intake of fresh fruits, vegetables, and fiber.  -Stressed the importance of regular exercise.   -Substance Abuse: Discussed cessation/primary prevention of tobacco, alcohol, or other drug use; driving or other dangerous activities under the influence; availability of treatment for abuse.   -Injury prevention: Discussed safety belts, safety helmets, smoke detector, smoking near bedding or upholstery.   -Sexuality: Discussed sexually transmitted diseases, partner selection, use of condoms, avoidance of unintended pregnancy and contraceptive alternatives.   -Dental health: Discussed importance of regular tooth brushing, flossing, and dental visits.  -Health maintenance and immunizations reviewed. Please refer to Health maintenance section.  Return to care in 1 year for next preventative  visit.     Subjective:  HPI:  He has no acute complaints today.   # Hypertension - Currently takes maxide as needed for elevated BP.  He is not on any daily medications.  No reported chest pain or shortness of breath.  # Dyslipidemia / PAD -On Pletal 100 mg twice daily and Crestor 10 mg daily.  Symptoms seem to be improving.  # COPD -On Advair and tolerating well. -ROS: No wheeze, cough, or shortness of breath.  # Neck Stiffness  Occasionally wakes up in the morning with stiff neck.  Has tried Zanaflex in the past which was worked well without side effects.  Lifestyle Diet: Tries to eat a healthy, balanced diet.  Exercise: Walks about 30 minutes per day.   Depression screen [facility] 2/9 12/30/2018  Decreased Interest 0  Down, Depressed, Hopeless 0  PHQ - 2 Score 0   ROS: Per HPI, otherwise a complete review of systems was negative.   PMH:  The following were reviewed and entered/updated in epic: Past Medical History:  Diagnosis Date  . BRADYCARDIA 06/01/2007  . Cataract    bil cataracts removed  . COPD (chronic obstructive pulmonary disease) ([place])   . EMPHYSEMA 03/28/2008  . Emphysema of lung ([place])   . HYPERGLYCEMIA 06/01/2007  . HYPERLIPIDEMIA 06/01/2007  . Hypertension   . HYPERTENSION 01/19/2009   Patient Active Problem List   Diagnosis Date Noted  . Neck pain 12/30/2018  . GERD (gastroesophageal reflux disease) 09/28/2018  . PAD (peripheral artery disease) ([place]) 06/15/2018  . Former smoker 06/15/2018  . History of colonic polyps 01/07/2018  . Nonspecific abnormal finding in stool contents 01/07/2018  . Allergic rhinitis 05/31/2013  . Urticaria 11/02/2011  . HTN (hypertension) 01/19/2009  . COPD, group  A, by GOLD 2017 classification ([place]) 03/28/2008  . Dyslipidemia 06/01/2007  . Bradycardia 06/01/2007  . HYPERGLYCEMIA 06/01/2007   Past Surgical History:  Procedure Laterality Date  . COLONOSCOPY    . POLYPECTOMY    . TONSILLECTOMY AND ADENOIDECTOMY       Family History  Problem Relation Age of Onset  . Cancer Mother        "Male" cancer  . Uterine cancer Mother   . Heart disease Father   . Colon cancer Neg Hx   . Esophageal cancer Neg Hx   . Rectal cancer Neg Hx   . Stomach cancer Neg Hx   . Liver cancer Neg Hx   . Pancreatic cancer Neg Hx   . Prostate cancer Neg Hx     Medications- reviewed and updated Current Outpatient Medications  Medication Sig Dispense Refill  . aspirin 81 MG tablet 1/2 tablet by mouth daily    . cilostazol (PLETAL) 100 MG tablet Take 1 tablet (100 mg total) by mouth 2 (two) times daily. 60 tablet 11  . Fluticasone-Salmeterol (ADVAIR DISKUS) 250-50 MCG/DOSE AEPB inhale 1 dose by mouth twice a day 3 each 6  . rosuvastatin (CRESTOR) 10 MG tablet Take 1 tablet (10 mg total) by mouth daily. 90 tablet 3  . losartan (COZAAR) 50 MG tablet Take 1 tablet (50 mg total) by mouth daily. 90 tablet 3  . tizanidine (ZANAFLEX) 2 MG capsule Take 1 capsule (2 mg total) by mouth 3 (three) times daily as needed for muscle spasms. 30 capsule 0   No current facility-administered medications for this visit.     Allergies-reviewed and updated No Known Allergies  Social History   Socioeconomic History  . Marital status: Married    Spouse name: Not on file  . Number of children: Not on file  . Years of education: Not on file  . Highest education level: Not on file  Occupational History  . Occupation: Retired   Scientific laboratory technician  . Financial resource strain: Not on file  . Food insecurity:    Worry: Not on file    Inability: Not on file  . Transportation needs:    Medical: Not on file    Non-medical: Not on file  Tobacco Use  . Smoking status: Former Smoker    Packs/day: 0.50    Years: 40.00    Pack years: 20.00    Types: Cigarettes    Last attempt to quit: 09/26/2017    Years since quitting: 1.2  . Smokeless tobacco: Never Used  . Tobacco comment: stopped 2018  Substance and Sexual Activity  . Alcohol use: No   . Drug use: No  . Sexual activity: Not on file  Lifestyle  . Physical activity:    Days per week: Not on file    Minutes per session: Not on file  . Stress: Not on file  Relationships  . Social connections:    Talks on phone: Not on file    Gets together: Not on file    Attends religious service: Not on file    Active member of club or organization: Not on file    Attends meetings of clubs or organizations: Not on file    Relationship status: Not on file  Other Topics Concern  . Not on file  Social History Narrative  . Not on file        Objective:  Physical Exam: BP (!) 191/94 (BP Location: Left Arm, Patient Position: Sitting, Cuff Size: Normal)  Pulse 66   Temp 97.7 F (36.5 C) (Oral)   Ht 5\' 11"  (1.803 m)   Wt 225 lb 4 oz (102.2 kg)   SpO2 96%   BMI 31.42 kg/m   Body mass index is 31.42 kg/m. Wt Readings from Last 3 Encounters:  12/30/18 225 lb 4 oz (102.2 kg)  09/30/18 214 lb (97.1 kg)  09/28/18 223 lb 9.6 oz (101.4 kg)   Gen: NAD, resting comfortably HEENT: TMs normal bilaterally. OP clear. No thyromegaly noted.  CV: RRR with no murmurs appreciated Pulm: NWOB, CTAB with no crackles, wheezes, or rhonchi GI: Normal bowel sounds present. Soft, Nontender, Nondistended. MSK: no edema, cyanosis, or clubbing noted Skin: warm, dry Neuro: CN2-12 grossly intact. Strength 5/5 in upper and lower extremities. Reflexes symmetric and intact bilaterally.  Psych: Normal affect and thought content     Fausto Sampedro M. Jerline Pain, MD 12/30/2018 1:49 PM

## 2018-12-30 NOTE — Patient Instructions (Signed)
It was very nice to see you today!  Please start the losartan.  Please take every day.  Let me know if your pressures drop below 100/60 or if they are above 150/90.  Please use the Zanaflex as needed.  We will check blood work today.  Take care, Dr Jerline Pain  Preventive Care 82 Years and Older, Male Preventive care refers to lifestyle choices and visits with your health care provider that can promote health and wellness. What does preventive care include?   A yearly physical exam. This is also called an annual well check.  Dental exams once or twice a year.  Routine eye exams. Ask your health care provider how often you should have your eyes checked.  Personal lifestyle choices, including: ? Daily care of your teeth and gums. ? Regular physical activity. ? Eating a healthy diet. ? Avoiding tobacco and drug use. ? Limiting alcohol use. ? Practicing safe sex. ? Taking low doses of aspirin every day. ? Taking vitamin and mineral supplements as recommended by your health care provider. What happens during an annual well check? The services and screenings done by your health care provider during your annual well check will depend on your age, overall health, lifestyle risk factors, and family history of disease. Counseling Your health care provider may ask you questions about your:  Alcohol use.  Tobacco use.  Drug use.  Emotional well-being.  Home and relationship well-being.  Sexual activity.  Eating habits.  History of falls.  Memory and ability to understand (cognition).  Work and work Statistician. Screening You may have the following tests or measurements:  Height, weight, and BMI.  Blood pressure.  Lipid and cholesterol levels. These may be checked every 5 years, or more frequently if you are over 56 years old.  Skin check.  Lung cancer screening. You may have this screening every year starting at age 9 if you have a 30-pack-year history of smoking and  currently smoke or have quit within the past 15 years.  Colorectal cancer screening. All adults should have this screening starting at age 47 and continuing until age 54. You will have tests every 1-10 years, depending on your results and the type of screening test. People at increased risk should start screening at an earlier age. Screening tests may include: ? Guaiac-based fecal occult blood testing. ? Fecal immunochemical test (FIT). ? Stool DNA test. ? Virtual colonoscopy. ? Sigmoidoscopy. During this test, a flexible tube with a tiny camera (sigmoidoscope) is used to examine your rectum and lower colon. The sigmoidoscope is inserted through your anus into your rectum and lower colon. ? Colonoscopy. During this test, a long, thin, flexible tube with a tiny camera (colonoscope) is used to examine your entire colon and rectum.  Prostate cancer screening. Recommendations will vary depending on your family history and other risks.  Hepatitis C blood test.  Hepatitis B blood test.  Sexually transmitted disease (STD) testing.  Diabetes screening. This is done by checking your blood sugar (glucose) after you have not eaten for a while (fasting). You may have this done every 1-3 years.  Abdominal aortic aneurysm (AAA) screening. You may need this if you are a current or former smoker.  Osteoporosis. You may be screened starting at age 64 if you are at high risk. Talk with your health care provider about your test results, treatment options, and if necessary, the need for more tests. Vaccines Your health care provider may recommend certain vaccines, such as:  Influenza  vaccine. This is recommended every year.  Tetanus, diphtheria, and acellular pertussis (Tdap, Td) vaccine. You may need a Td booster every 10 years.  Varicella vaccine. You may need this if you have not been vaccinated.  Zoster vaccine. You may need this after age 42.  Measles, mumps, and rubella (MMR) vaccine. You may  need at least one dose of MMR if you were born in 1957 or later. You may also need a second dose.  Pneumococcal 13-valent conjugate (PCV13) vaccine. One dose is recommended after age 61.  Pneumococcal polysaccharide (PPSV23) vaccine. One dose is recommended after age 29.  Meningococcal vaccine. You may need this if you have certain conditions.  Hepatitis A vaccine. You may need this if you have certain conditions or if you travel or work in places where you may be exposed to hepatitis A.  Hepatitis B vaccine. You may need this if you have certain conditions or if you travel or work in places where you may be exposed to hepatitis B.  Haemophilus influenzae type b (Hib) vaccine. You may need this if you have certain risk factors. Talk to your health care provider about which screenings and vaccines you need and how often you need them. This information is not intended to replace advice given to you by your health care provider. Make sure you discuss any questions you have with your health care provider. Document Released: 10/26/2015 Document Revised: 11/19/2017 Document Reviewed: 07/31/2015 Elsevier Interactive Patient Education  2019 [employer].

## 2018-12-30 NOTE — Assessment & Plan Note (Signed)
Continue aspirin, Pletal, and Crestor.  Continue exercise therapy.

## 2018-12-30 NOTE — Assessment & Plan Note (Signed)
Stable.  Continue Advair. 

## 2018-12-30 NOTE — Assessment & Plan Note (Signed)
No red flags.  Start Zanaflex 2 mg 3 times daily as needed.  Discussed potential side effects.

## 2018-12-31 ENCOUNTER — Other Ambulatory Visit: Payer: Self-pay

## 2018-12-31 ENCOUNTER — Telehealth: Payer: Self-pay | Admitting: Family Medicine

## 2018-12-31 MED ORDER — TIZANIDINE HCL 2 MG PO TABS: 2.0000 mg | ORAL_TABLET | Freq: Four times a day (QID) | ORAL | 0 refills | Status: DC | PRN

## 2018-12-31 NOTE — Telephone Encounter (Signed)
Rx sent to pharmacy   

## 2018-12-31 NOTE — Progress Notes (Signed)
Please inform patient of the following:  His blood sugar is a bit high but everything else is ok. Do not need to start meds. We can recheck next time we get blood work.  Bryan Armstrong. Jerline Pain, MD 12/31/2018 12:59 PM

## 2018-12-31 NOTE — Telephone Encounter (Signed)
Pt came in office yesterday and needs tizanidine (ZANAFLEX) 2 MG capsule sent as tablets and not capsules to  [facility] [place], [address] AT [address] [phone] (Phone) [phone] (Fax)

## 2019-01-06 ENCOUNTER — Telehealth: Payer: Self-pay | Admitting: Family Medicine

## 2019-01-06 NOTE — Telephone Encounter (Signed)
Results have been placed in the mail.

## 2019-01-06 NOTE — Telephone Encounter (Signed)
See note  Copied from [place] [id number]. Topic: General - Other >> Jan 06, 2019  9:59 AM Richardo Priest, NT wrote: Reason for CRM:  Patient called line and left voicemail stating Dr. Marigene Ehlers nurse informed him that they would send the results for his blood work taken on 12/30/2018, to his address. Patient's call back number is [phone]. Patients address is [place] [address]

## 2019-01-18 ENCOUNTER — Other Ambulatory Visit: Payer: Self-pay

## 2019-01-18 ENCOUNTER — Ambulatory Visit (INDEPENDENT_AMBULATORY_CARE_PROVIDER_SITE_OTHER): Payer: Medicare Other | Admitting: Family Medicine

## 2019-01-18 DIAGNOSIS — R031 Nonspecific low blood-pressure reading: Secondary | ICD-10-CM | POA: Diagnosis not present

## 2019-01-18 DIAGNOSIS — I10 Essential (primary) hypertension: Secondary | ICD-10-CM | POA: Diagnosis not present

## 2019-01-18 MED ORDER — TRIAMTERENE-HCTZ 37.5-25 MG PO TABS: 0.5000 | ORAL_TABLET | Freq: Every day | ORAL | 3 refills | Status: DC

## 2019-01-18 NOTE — Assessment & Plan Note (Addendum)
He has had symptomatic lows with low-dose losartan.  He has had intermittent lows in the past as well.  In light of this, we will stop losartan.  Will restart Maxide to use as needed for elevated blood pressure readings as he was doing previously. Discussed home BP monitoring with goal 140/90 or lower. Discussed reasons to return to care and seek emergent care.

## 2019-01-18 NOTE — Progress Notes (Signed)
    Chief Complaint:  Bryan Armstrong is a 84 y.o. male who presents today for a virtual office visit with a chief complaint of hypertension and low blood pressure readings.  Assessment/Plan:  HTN (hypertension) He has had symptomatic lows with low-dose losartan.  He has had intermittent lows in the past as well.  In light of this, we will stop losartan.  Will restart Maxide to use as needed for elevated blood pressure readings as he was doing previously. Discussed home BP monitoring with goal 140/90 or lower. Discussed reasons to return to care and seek emergent care.    Subjective:  HPI:  # Essential Hypertension Chronic problem.  Was seen about a month ago and had significantly elevated blood pressure reading and was started on losartan 50 mg daily.  He was previously on triamterene-HCTZ as needed.-Has had several low blood pressure readings in the 90s over 50s.  Felt dizzy with this.  No syncopal episodes.  He has stopped his medication and blood pressure readings have improved to the the 110s over 60s.  ROS: Per HPI  PMH: He reports that he quit smoking about 15 months ago. His smoking use included cigarettes. He has a 20.00 pack-year smoking history. He has never used smokeless tobacco. He reports that he does not drink alcohol or use drugs.      Objective/Observations  Physical Exam: Gen: NAD, resting comfortably Pulm: Normal work of breathing Neuro: Grossly normal, moves all extremities Psych: Normal affect and thought content  No results found for this or any previous visit (from the past 24 hour(s)).   Virtual Visit via Video   I connected with [name] on 01/18/19 at 10:40 AM EDT by a video enabled telemedicine application and verified that I am speaking with the correct person using two identifiers. I discussed the limitations of evaluation and management by telemedicine and the availability of in person appointments. The patient expressed understanding and agreed  to proceed.   Patient location: Home Provider location: [place] participating in the virtual visit: Myself and patient     Algis Greenhouse. Jerline Pain, MD 01/18/2019 9:07 AM

## 2019-02-15 ENCOUNTER — Ambulatory Visit: Payer: Medicare Other | Admitting: Family Medicine

## 2019-02-21 ENCOUNTER — Other Ambulatory Visit: Payer: Self-pay | Admitting: Endocrinology

## 2019-03-04 ENCOUNTER — Other Ambulatory Visit: Payer: Self-pay | Admitting: Endocrinology

## 2019-03-05 ENCOUNTER — Other Ambulatory Visit: Payer: Self-pay | Admitting: Family Medicine

## 2019-03-25 ENCOUNTER — Other Ambulatory Visit: Payer: Self-pay

## 2019-03-25 DIAGNOSIS — I779 Disorder of arteries and arterioles, unspecified: Secondary | ICD-10-CM

## 2019-03-29 ENCOUNTER — Ambulatory Visit (INDEPENDENT_AMBULATORY_CARE_PROVIDER_SITE_OTHER): Payer: Medicare Other | Admitting: Physician Assistant

## 2019-03-29 ENCOUNTER — Ambulatory Visit (HOSPITAL_COMMUNITY)
Admission: RE | Admit: 2019-03-29 | Discharge: 2019-03-29 | Disposition: A | Discharge status: Home / Self Care | Payer: Medicare Other | Source: Ambulatory Visit | Attending: Family | Admitting: Family

## 2019-03-29 ENCOUNTER — Other Ambulatory Visit: Payer: Self-pay

## 2019-03-29 VITALS — BP 124/74 | HR 54 | Temp 97.7°F | Resp 14 | Ht 71.0 in | Wt 216.9 lb

## 2019-03-29 DIAGNOSIS — I779 Disorder of arteries and arterioles, unspecified: Secondary | ICD-10-CM | POA: Diagnosis not present

## 2019-03-29 DIAGNOSIS — I739 Peripheral vascular disease, unspecified: Secondary | ICD-10-CM | POA: Diagnosis not present

## 2019-03-29 DIAGNOSIS — Z87891 Personal history of nicotine dependence: Secondary | ICD-10-CM | POA: Diagnosis not present

## 2019-03-29 NOTE — Progress Notes (Signed)
    Established Intermittent Claudication   History of Present Illness   Bryan Armstrong is a 84 y.o. ([birth year]) male who presents with chief complaint of claudication who returns with 58-month ABI recheck.  Patient states that claudication symptoms have actually improved since office visit 6 months prior.  He is still able to mow his lawn and play golf without experiencing calf claudication.  He denies any rest pain or active tissue ischemia of bilateral lower extremities.  He was prescribed Pletal at last office visit however this made him lethargic and lowered his blood pressure and thus this was discontinued.  Patient also has varicose veins of bilateral lower extremities that are also asymptomatic.  He denies any excessive edema and does not wear compression stockings.  He is taking an aspirin and statin daily.  He denies tobacco use.    Current Outpatient Medications  Medication Sig Dispense Refill  . aspirin 81 MG tablet 1/2 tablet by mouth daily    . Fluticasone-Salmeterol (ADVAIR DISKUS) 250-50 MCG/DOSE AEPB inhale 1 dose by mouth twice a day 3 each 6  . rosuvastatin (CRESTOR) 10 MG tablet TAKE 1 TABLET BY MOUTH DAILY 90 tablet 3  . tiZANidine (ZANAFLEX) 2 MG tablet Take 1 tablet (2 mg total) by mouth every 6 (six) hours as needed for muscle spasms. 30 tablet 0  . triamterene-hydrochlorothiazide (MAXZIDE-25) 37.5-25 MG tablet Take 0.5 tablets by mouth daily. 45 tablet 3   No current facility-administered medications for this visit.     On ROS today: 10 system ROS is negative unless otherwise noted in HPI   Physical Examination   Vitals:   03/29/19 1437 03/29/19 1439  BP: 125/70 124/74  Pulse: (!) 54   Resp: 14   Temp: 97.7 F (36.5 C)   TempSrc: Temporal   SpO2: 98%   Weight: 216 lb 14.4 oz (98.4 kg)   Height: 5\' 11"  (1.803 m)    Body mass index is 30.25 kg/m.  General Alert, O x 3, WD, NAD  Pulmonary Sym exp, good B air movt  Cardiac RRR, Nl S1, S2  Vascular  Vessel Right Left  Radial Palpable Palpable  Brachial Palpable Palpable  Femoral Palpable Palpable  Popliteal Not palpable Not palpable  PT Not palpable Not palpable  DP Not palpable Not palpable    Gastro- intestinal soft, non-distended, non-tender to palpation,   Musculo- skeletal M/S 5/5 throughout  , Extremities without ischemic changes  , No edema present, Varicosities present: Medially bilateral lower extremities, No Lipodermatosclerosis present  Neurologic Pain and light touch intact in extremities , Motor exam as listed above    Non-Invasive Vascular imaging   ABI (03/29/19)  R:   ABI: 0.66    PT: mono  DP: mono  TBI:  0.34  L:   ABI: 0.65   PT: mono  DP: mono  TBI: 0.37    Medical Decision Making   Bryan Armstrong is a 84 y.o. male who presents with:  bilateral leg intermittent claudication without evidence of critical limb ischemia.   Claudication distance unchanged  ABIs also unchanged with right ABI of 0.66 and left ABI of 0.65; without rest pain or active tissue ischemia  Encouraged continue active lifestyle with walking program  Continue aspirin and statin daily  Recheck ABIs in 1 year  Return sooner if claudication worsens, rest pain develops, or tissue changes occur  Dagoberto Ligas PA-C [facility] Office: [phone]  Clinic MD: Dr. Carlis Abbott

## 2019-05-13 ENCOUNTER — Telehealth: Payer: Self-pay | Admitting: Family Medicine

## 2019-05-13 ENCOUNTER — Other Ambulatory Visit: Payer: Self-pay

## 2019-05-13 MED ORDER — EPINEPHRINE 0.3 MG/0.3ML IJ SOAJ: 0.3000 mg | INTRAMUSCULAR | 0 refills | Status: DC | PRN

## 2019-05-13 NOTE — Telephone Encounter (Signed)
Rx sent,notified.

## 2019-05-13 NOTE — Telephone Encounter (Signed)
Copied from [place] [id number]. Topic: Quick Communication - Rx Refill/Question >> May 13, 2019  1:08 PM Erick Blinks wrote: Medication: Epi pen refill - pt had to use his 5 days ago because he had an encounter with yellow jackets. Please advise  Has the patient contacted their pharmacy? Yes.   (Agent: If no, request that the patient contact the pharmacy for the refill.) (Agent: If yes, when and what did the pharmacy advise?)  Preferred Pharmacy (with phone number or street name): [facility], [address] AT [address] [place] [address] Phone: [phone] Fax: [phone]    Agent: Please be advised that RX refills may take up to 3 business days. We ask that you follow-up with your pharmacy.

## 2019-05-13 NOTE — Telephone Encounter (Signed)
See note

## 2019-06-23 ENCOUNTER — Other Ambulatory Visit: Payer: Self-pay

## 2019-06-23 ENCOUNTER — Encounter (INDEPENDENT_AMBULATORY_CARE_PROVIDER_SITE_OTHER): Payer: Medicare Other | Admitting: Ophthalmology

## 2019-06-23 DIAGNOSIS — H43813 Vitreous degeneration, bilateral: Secondary | ICD-10-CM

## 2019-06-23 DIAGNOSIS — H35372 Puckering of macula, left eye: Secondary | ICD-10-CM | POA: Diagnosis not present

## 2019-06-23 DIAGNOSIS — I10 Essential (primary) hypertension: Secondary | ICD-10-CM | POA: Diagnosis not present

## 2019-06-23 DIAGNOSIS — H35033 Hypertensive retinopathy, bilateral: Secondary | ICD-10-CM | POA: Diagnosis not present

## 2019-06-23 DIAGNOSIS — H353111 Nonexudative age-related macular degeneration, right eye, early dry stage: Secondary | ICD-10-CM

## 2019-08-01 ENCOUNTER — Telehealth: Payer: Self-pay | Admitting: Family Medicine

## 2019-08-01 NOTE — Telephone Encounter (Signed)
I left a message asking the patient to call and schedule Medicare AWV with Courtney ([facility]).  If patient calls back, please schedule Medicare Wellness Visit at next available opening.  VDM (Dee-Dee) °

## 2019-08-01 NOTE — Telephone Encounter (Signed)
P 

## 2019-08-01 NOTE — Telephone Encounter (Signed)
Patient is calling back. He decline AWV with Nurse. He would like to see Dr. Jerline Armstrong. Please advise

## 2019-08-02 NOTE — Telephone Encounter (Signed)
See note

## 2019-09-13 ENCOUNTER — Ambulatory Visit: Payer: Medicare Other | Admitting: Family Medicine

## 2019-09-29 ENCOUNTER — Other Ambulatory Visit: Payer: Self-pay

## 2019-09-29 ENCOUNTER — Encounter: Payer: Self-pay | Admitting: Pulmonary Disease

## 2019-09-29 ENCOUNTER — Ambulatory Visit: Payer: Medicare Other | Admitting: Pulmonary Disease

## 2019-09-29 ENCOUNTER — Ambulatory Visit (INDEPENDENT_AMBULATORY_CARE_PROVIDER_SITE_OTHER): Payer: Medicare Other

## 2019-09-29 VITALS — BP 122/64 | HR 61 | Temp 97.8°F | Ht 71.0 in | Wt 216.8 lb

## 2019-09-29 DIAGNOSIS — J449 Chronic obstructive pulmonary disease, unspecified: Secondary | ICD-10-CM | POA: Diagnosis not present

## 2019-09-29 DIAGNOSIS — R0602 Shortness of breath: Secondary | ICD-10-CM | POA: Diagnosis not present

## 2019-09-29 NOTE — Patient Instructions (Signed)
I am glad you are doing well with your breathing Continue Advair We will get a chest x-ray today Follow-up in 1 year

## 2019-09-29 NOTE — Addendum Note (Signed)
Addended by: Hildred Alamin I on: 09/29/2019 02:26 PM   Modules accepted: Orders

## 2019-09-29 NOTE — Progress Notes (Signed)
         RAZIEL CAMIRE    [id number]    1935-05-28  Primary Care Physician:Parker, Algis Greenhouse, MD  Referring Physician: Vivi Barrack, [address] [place],  [place] [address]  Chief complaint:   Follow up for COPD GOLD A  HPI: [name] is a 84 year old with COPD GOLD A (CAT score 7, no exacerbations, FEV1 89%). He is doing well with this current inhaler therapy. He is also on albuterol when necessary. However he does not need his rescue inhaler. He does not have any recent exacerbations or ER visits.  He feels well today with dyspnea on exertion at baseline. He denies any fevers, cough, wheezing, chest pain, palpitation. He quit smoking in September 2017.He used to smoke about half pack a day for about 40 years.  Interim History: Doing well on Advair.  He uses the inhaler once a day but feels that it helps with chest congestion and wheezing  Outpatient Encounter Medications as of 09/29/2019  Medication Sig  . aspirin 81 MG tablet 1/2 tablet by mouth daily  . EPINEPHrine (EPIPEN 2-PAK) 0.3 mg/0.3 mL IJ SOAJ injection Inject 0.3 mLs (0.3 mg total) into the muscle as needed for anaphylaxis.  Marland Kitchen Fluticasone-Salmeterol (ADVAIR DISKUS) 250-50 MCG/DOSE AEPB inhale 1 dose by mouth twice a day  . rosuvastatin (CRESTOR) 10 MG tablet TAKE 1 TABLET BY MOUTH DAILY  . tiZANidine (ZANAFLEX) 2 MG tablet Take 1 tablet (2 mg total) by mouth every 6 (six) hours as needed for muscle spasms.  Marland Kitchen triamterene-hydrochlorothiazide (MAXZIDE-25) 37.5-25 MG tablet Take 0.5 tablets by mouth daily.   No facility-administered encounter medications on file as of 09/29/2019.   Physical Exam: Blood pressure 122/64, pulse 61, temperature 97.8 F (36.6 C), temperature source Temporal, height 5\' 11"  (1.803 m), weight 216 lb 12.8 oz (98.3 kg), SpO2 98 %. Gen:      No acute distress HEENT:  EOMI, sclera anicteric Neck:     No masses; no thyromegaly Lungs:    Clear to auscultation bilaterally; normal respiratory  effort CV:         Regular rate and rhythm; no murmurs Abd:      + bowel sounds; soft, non-tender; no palpable masses, no distension Ext:    No edema; adequate peripheral perfusion Skin:      Warm and dry; no rash Neuro: alert and oriented x 3 Psych: normal mood and affect  Data Reviewed: PFTs [01/06/05] FVC 4.50 [96%], FEV1 2.85 [89], F/F 63, TLC 7.76 [111%], DLCO 132%. Mild airway obstruction, normal lung volumes, no bronchial dilator response.  Chest x-ray [08/09/11] No acute cardiopulmonary abnormalities. Images reviewed  CBC 0000000 eosinophilic count 0000000  Assessment:  COPD GOLD A Stable on advair.  Blood count noted for elevated eosinophils.  He will need to be on inhaled steroid therapy Chest x-ray today  Plan/Recommendations: Continue Advair Chest x-ray  Marshell Garfinkel MD [facility] Pager [phone] 09/29/2019, 2:12 PM  CC: Vivi Barrack, MD

## 2019-09-29 NOTE — Progress Notes (Deleted)
@Patient  ID: Bryan Armstrong, male    DOB: January 11, 1935, 84 y.o.   MRN: [id number]  No chief complaint on file.   Referring provider: Vivi Barrack, MD  HPI:  84 year old male former smoker followed in our office for COPD Gold 1A.   PMH: Hypertension, PAD Smoker/ Smoking History: Former smoker.  Quit 2018.  20-pack-year smoking history. Maintenance: Advair 250 Pt of: Dr. Vaughan Browner   09/29/2019  - Visit     Questionaires / Pulmonary Flowsheets:   ACT:  No flowsheet data found.  MMRC: No flowsheet data found.  Epworth:  No flowsheet data found.  Tests:   FENO:  No results found for: NITRICOXIDE  PFT: No flowsheet data found.  WALK:  No flowsheet data found.  Imaging: No results found.  Lab Results:  CBC    Component Value Date/Time   WBC 7.0 12/30/2018 1218   RBC 4.91 12/30/2018 1218   HGB 14.6 12/30/2018 1218   HCT 43.4 12/30/2018 1218   PLT 214.0 12/30/2018 1218   MCV 88.4 12/30/2018 1218   MCH 29.8 08/10/2011 0558   MCHC 33.7 12/30/2018 1218   RDW 13.9 12/30/2018 1218   LYMPHSABS 1.7 12/15/2017 1458   MONOABS 0.6 12/15/2017 1458   EOSABS 0.4 12/15/2017 1458   BASOSABS 0.1 12/15/2017 1458    BMET    Component Value Date/Time   NA 138 12/30/2018 1218   K 4.3 12/30/2018 1218   CL 102 12/30/2018 1218   CO2 29 12/30/2018 1218   GLUCOSE 110 (H) 12/30/2018 1218   BUN 18 12/30/2018 1218   CREATININE 0.97 12/30/2018 1218   CALCIUM 9.2 12/30/2018 1218   GFRNONAA 83 (L) 08/10/2011 0558   GFRAA >90 08/10/2011 0558    BNP No results found for: BNP  ProBNP    Component Value Date/Time   PROBNP 162.5 (H) 08/09/2011 0455    Specialty Problems      Pulmonary Problems   COPD, group A, by GOLD 2017 classification ([place])    PFT"s 2006:  FEV1 2.85 (89%), ratio 63, no restriction, +airtrapping, DLCO normal.       Allergic rhinitis      No Known Allergies  Immunization History  Administered Date(s) Administered  . Influenza Split  07/11/2016  . Influenza Whole 08/15/2002, 09/21/2007, 08/03/2008, 08/08/2009, 08/01/2010  . Influenza, High Dose Seasonal PF 06/20/2014, 08/30/2018  . Influenza,inj,Quad PF,6+ Mos 06/28/2013, 08/28/2015  . Influenza-Unspecified 08/14/2011, 07/18/2017, 07/19/2019  . Pneumococcal Conjugate-13 10/23/2015  . Pneumococcal Polysaccharide-23 10/14/2002  . Td 07/13/2001  . Tdap 07/14/2011  . Zoster 06/14/2015    Past Medical History:  Diagnosis Date  . BRADYCARDIA 06/01/2007  . Cataract    bil cataracts removed  . COPD (chronic obstructive pulmonary disease) ([place])   . EMPHYSEMA 03/28/2008  . Emphysema of lung ([place])   . HYPERGLYCEMIA 06/01/2007  . HYPERLIPIDEMIA 06/01/2007  . Hypertension   . HYPERTENSION 01/19/2009    Tobacco History: Social History   Tobacco Use  Smoking Status Former Smoker  . Packs/day: 0.50  . Years: 40.00  . Pack years: 20.00  . Types: Cigarettes  . Quit date: 09/26/2017  . Years since quitting: 2.0  Smokeless Tobacco Never Used  Tobacco Comment   stopped 2018   Counseling given: Not Answered Comment: stopped 2018   Continue to not smoke  Outpatient Encounter Medications as of 09/29/2019  Medication Sig  . aspirin 81 MG tablet 1/2 tablet by mouth daily  . EPINEPHrine (EPIPEN 2-PAK) 0.3 mg/0.3 mL IJ SOAJ  injection Inject 0.3 mLs (0.3 mg total) into the muscle as needed for anaphylaxis.  Marland Kitchen Fluticasone-Salmeterol (ADVAIR DISKUS) 250-50 MCG/DOSE AEPB inhale 1 dose by mouth twice a day  . rosuvastatin (CRESTOR) 10 MG tablet TAKE 1 TABLET BY MOUTH DAILY  . tiZANidine (ZANAFLEX) 2 MG tablet Take 1 tablet (2 mg total) by mouth every 6 (six) hours as needed for muscle spasms.  Marland Kitchen triamterene-hydrochlorothiazide (MAXZIDE-25) 37.5-25 MG tablet Take 0.5 tablets by mouth daily.   No facility-administered encounter medications on file as of 09/29/2019.     Review of Systems  Review of Systems   Physical Exam  There were no vitals taken for this visit.  Wt  Readings from Last 5 Encounters:  03/29/19 216 lb 14.4 oz (98.4 kg)  12/30/18 225 lb 4 oz (102.2 kg)  09/30/18 214 lb (97.1 kg)  09/28/18 223 lb 9.6 oz (101.4 kg)  06/15/18 215 lb 12.8 oz (97.9 kg)    BMI Readings from Last 5 Encounters:  03/29/19 30.25 kg/m  12/30/18 31.42 kg/m  09/30/18 29.85 kg/m  09/28/18 31.19 kg/m  06/15/18 30.10 kg/m     Physical Exam    Assessment & Plan:   No problem-specific Assessment & Plan notes found for this encounter.    No follow-ups on file.   Lauraine Rinne, NP 09/29/2019   This appointment was *** minutes long with over 50% of the time in direct face-to-face patient care, assessment, plan of care, and follow-up.

## 2019-09-30 ENCOUNTER — Telehealth: Payer: Self-pay | Admitting: Pulmonary Disease

## 2019-09-30 DIAGNOSIS — J449 Chronic obstructive pulmonary disease, unspecified: Secondary | ICD-10-CM

## 2019-09-30 MED ORDER — FLUTICASONE-SALMETEROL 250-50 MCG/DOSE IN AEPB: INHALATION_SPRAY | RESPIRATORY_TRACT | 5 refills | Status: DC

## 2019-09-30 NOTE — Telephone Encounter (Signed)
Spoke with pt and advised rx sent to pharmacy. Nothing further is needed.   

## 2019-11-09 ENCOUNTER — Other Ambulatory Visit: Payer: Self-pay

## 2019-11-09 DIAGNOSIS — J449 Chronic obstructive pulmonary disease, unspecified: Secondary | ICD-10-CM

## 2019-11-09 NOTE — Progress Notes (Signed)
ches

## 2019-11-11 ENCOUNTER — Ambulatory Visit: Payer: Medicare Other | Admitting: Pulmonary Disease

## 2019-11-11 ENCOUNTER — Ambulatory Visit (INDEPENDENT_AMBULATORY_CARE_PROVIDER_SITE_OTHER): Payer: Medicare Other

## 2019-11-11 ENCOUNTER — Other Ambulatory Visit: Payer: Self-pay

## 2019-11-11 ENCOUNTER — Encounter: Payer: Self-pay | Admitting: Pulmonary Disease

## 2019-11-11 VITALS — BP 116/68 | HR 65 | Temp 97.7°F | Ht 71.0 in | Wt 216.8 lb

## 2019-11-11 DIAGNOSIS — J449 Chronic obstructive pulmonary disease, unspecified: Secondary | ICD-10-CM

## 2019-11-11 DIAGNOSIS — J439 Emphysema, unspecified: Secondary | ICD-10-CM | POA: Diagnosis not present

## 2019-11-11 NOTE — Progress Notes (Signed)
         Bryan Armstrong    [id number]    01/30/1935  Primary Care Physician:Parker, Algis Greenhouse, MD  Referring Physician: Vivi Armstrong, [address] [place],  [place] [address]  Chief complaint:   Follow up for COPD GOLD A  HPI: Bryan Armstrong is a 85 year old with COPD GOLD A (CAT score 7, no exacerbations, FEV1 89%). He is doing well with this current inhaler therapy. He is also on albuterol when necessary. However he does not need his rescue inhaler. He does not have any recent exacerbations or ER visits.  He feels well today with dyspnea on exertion at baseline. He denies any fevers, cough, wheezing, chest pain, palpitation. He quit smoking in September 2017.He used to smoke about half pack a day for about 40 years.  Interim History: Symptoms are stable on Advair inhaler Chest x-ray at last visit showed vague right lower lobe opacity and is here for follow-up.  Denies any cough, sputum production, fevers, chills.  Outpatient Encounter Medications as of 11/11/2019  Medication Sig  . aspirin 81 MG tablet 1/2 tablet by mouth daily  . EPINEPHrine (EPIPEN 2-PAK) 0.3 mg/0.3 mL IJ SOAJ injection Inject 0.3 mLs (0.3 mg total) into the muscle as needed for anaphylaxis.  Marland Kitchen Fluticasone-Salmeterol (ADVAIR DISKUS) 250-50 MCG/DOSE AEPB inhale 1 dose by mouth twice a day  . rosuvastatin (CRESTOR) 10 MG tablet TAKE 1 TABLET BY MOUTH DAILY  . tiZANidine (ZANAFLEX) 2 MG tablet Take 1 tablet (2 mg total) by mouth every 6 (six) hours as needed for muscle spasms.  Marland Kitchen triamterene-hydrochlorothiazide (MAXZIDE-25) 37.5-25 MG tablet Take 0.5 tablets by mouth daily.   No facility-administered encounter medications on file as of 11/11/2019.   Physical Exam: Blood pressure 122/64, pulse 61, temperature 97.8 F (36.6 C), temperature source Temporal, height 5\' 11"  (1.803 m), weight 216 lb 12.8 oz (98.3 kg), SpO2 98 %. Gen:      No acute distress HEENT:  EOMI, sclera anicteric Neck:     No masses; no  thyromegaly Lungs:    Clear to auscultation bilaterally; normal respiratory effort CV:         Regular rate and rhythm; no murmurs Abd:      + bowel sounds; soft, non-tender; no palpable masses, no distension Ext:    No edema; adequate peripheral perfusion Skin:      Warm and dry; no rash Neuro: alert and oriented x 3 Psych: normal mood and affect  Data Reviewed: Imaging: Chest x-ray 09/29/2019-small reticulonodular opacity at the right lung base Chest x-ray 11/11/2019-no acute cardiopulmonary abnormality. I have reviewed the images personally.  PFTs [01/06/05] FVC 4.50 [96%], FEV1 2.85 [89], F/F 63, TLC 7.76 [111%], DLCO 132%. Mild airway obstruction, normal lung volumes, no bronchial dilator response.  Labs:  CBC 0000000 eosinophilic count 0000000  Assessment:  COPD GOLD A Stable on advair.  Blood count noted for elevated eosinophils.  He will need to be on inhaled steroid therapy Chest x-ray last month showed a vague right lung base opacity that is likely due to bronchitis.  He is asymptomatic at present time repeat chest x-ray is normal.  Plan/Recommendations: Continue Advair Follow-up in 6 months  Bryan Garfinkel MD [facility] 11/11/2019, 2:34 PM  CC: Bryan Barrack, MD

## 2019-11-11 NOTE — Patient Instructions (Signed)
Glad you are feeling well with regard to your breathing Continue your Advair inhaler Follow-up in 6 months with chest x-ray.

## 2020-01-09 ENCOUNTER — Other Ambulatory Visit: Payer: Self-pay

## 2020-01-10 ENCOUNTER — Telehealth: Payer: Self-pay | Admitting: Family Medicine

## 2020-01-10 ENCOUNTER — Encounter: Payer: Self-pay | Admitting: Family Medicine

## 2020-01-10 ENCOUNTER — Ambulatory Visit (INDEPENDENT_AMBULATORY_CARE_PROVIDER_SITE_OTHER): Payer: Medicare Other | Admitting: Family Medicine

## 2020-01-10 VITALS — BP 118/62 | HR 62 | Temp 97.7°F | Ht 71.0 in | Wt 209.5 lb

## 2020-01-10 DIAGNOSIS — Z6829 Body mass index (BMI) 29.0-29.9, adult: Secondary | ICD-10-CM

## 2020-01-10 DIAGNOSIS — R7309 Other abnormal glucose: Secondary | ICD-10-CM

## 2020-01-10 DIAGNOSIS — J449 Chronic obstructive pulmonary disease, unspecified: Secondary | ICD-10-CM

## 2020-01-10 DIAGNOSIS — Z0000 Encounter for general adult medical examination without abnormal findings: Secondary | ICD-10-CM

## 2020-01-10 DIAGNOSIS — C44719 Basal cell carcinoma of skin of left lower limb, including hip: Secondary | ICD-10-CM | POA: Diagnosis not present

## 2020-01-10 DIAGNOSIS — I739 Peripheral vascular disease, unspecified: Secondary | ICD-10-CM

## 2020-01-10 DIAGNOSIS — I10 Essential (primary) hypertension: Secondary | ICD-10-CM | POA: Diagnosis not present

## 2020-01-10 DIAGNOSIS — L989 Disorder of the skin and subcutaneous tissue, unspecified: Secondary | ICD-10-CM

## 2020-01-10 DIAGNOSIS — E785 Hyperlipidemia, unspecified: Secondary | ICD-10-CM

## 2020-01-10 DIAGNOSIS — E663 Overweight: Secondary | ICD-10-CM

## 2020-01-10 DIAGNOSIS — Z0001 Encounter for general adult medical examination with abnormal findings: Secondary | ICD-10-CM

## 2020-01-10 LAB — COMPREHENSIVE METABOLIC PANEL
ALT: 15 U/L (ref 0–53)
AST: 16 U/L (ref 0–37)
Albumin: 4.2 g/dL (ref 3.5–5.2)
Alkaline Phosphatase: 57 U/L (ref 39–117)
BUN: 23 mg/dL (ref 6–23)
CO2: 30 mEq/L (ref 19–32)
Calcium: 9.1 mg/dL (ref 8.4–10.5)
Chloride: 103 mEq/L (ref 96–112)
Creatinine, Ser: 1.03 mg/dL (ref 0.40–1.50)
GFR: 68.95 mL/min (ref >60.00)
Glucose, Bld: 95 mg/dL (ref 70–99)
Potassium: 4.3 mEq/L (ref 3.5–5.1)
Sodium: 138 mEq/L (ref 135–145)
Total Bilirubin: 0.7 mg/dL (ref 0.2–1.2)
Total Protein: 6.7 g/dL (ref 6.0–8.3)

## 2020-01-10 LAB — CBC
HCT: 41.3 % (ref 39.0–52.0)
Hemoglobin: 13.9 g/dL (ref 13.0–17.0)
MCHC: 33.7 g/dL (ref 30.0–36.0)
MCV: 89 fl (ref 78.0–100.0)
Platelets: 193 10*3/uL (ref 150.0–400.0)
RBC: 4.64 Mil/uL (ref 4.22–5.81)
RDW: 13.4 % (ref 11.5–15.5)
WBC: 8.5 10*3/uL (ref 4.0–10.5)

## 2020-01-10 LAB — HEMOGLOBIN A1C: Hgb A1c MFr Bld: 5.8 % (ref 4.6–6.5)

## 2020-01-10 LAB — LIPID PANEL
Cholesterol: 101 mg/dL (ref 0–200)
HDL: 31.5 mg/dL — ABNORMAL LOW (ref >39.00)
LDL Cholesterol: 49 mg/dL (ref 0–99)
NonHDL: 69.82
Total CHOL/HDL Ratio: 3
Triglycerides: 102 mg/dL (ref 0.0–149.0)
VLDL: 20.4 mg/dL (ref 0.0–40.0)

## 2020-01-10 LAB — TSH: TSH: 1.92 u[IU]/mL (ref 0.35–4.50)

## 2020-01-10 NOTE — Assessment & Plan Note (Addendum)
Worsening. Will place referral to Dr Gwenlyn Found per patient request.  Continue aspirin 81 mg daily and Crestor 10 mg daily.  Has tried Pletal in the past which has not helped.

## 2020-01-10 NOTE — Telephone Encounter (Signed)
Pt requested to have his lab work printed off and sent to him from today's visit. Please advise.

## 2020-01-10 NOTE — Progress Notes (Signed)
Chief Complaint:  Bryan Armstrong is a 85 y.o. male who presents today for his annual comprehensive physical exam and subsequent medicare annual wellness visit.    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  Punch biopsy performed today.  See below procedure note.  Chronic Problems Addressed Today: PAD (peripheral artery disease) (HCC) Worsening. Will place referral to Dr Gwenlyn Found per patient request.  Continue aspirin 81 mg daily and Crestor 10 mg daily.  Has tried Pletal in the past which has not helped.  HYPERGLYCEMIA Check A1c.  COPD, group A, by GOLD 2017 classification ([place]) Stable.  Continue Advair per pulmonology.  HTN (hypertension) At goal.  Continue Maxide half tablet 37.5-25 once daily.  Check CBC, CMET, TSH.  Dyslipidemia Check lipid panel.  Continue Crestor 10 mg daily.    Body mass index is 29.22 kg/m. / Overweight BMI Metric Follow Up - 01/10/20 1409      BMI Metric Follow Up-Please document annually   BMI Metric Follow Up  Education provided        Preventative Healthcare: Has already received both Covid vaccines.  Check labs including CBC, C met, TSH.  Will be due for next colonoscopy next year.  Due for flu vaccine next season.  No longer needs prostate cancer screening due to age.   Patient Counseling(The following topics were reviewed and/or handout was given):  -Nutrition: Stressed importance of moderation in sodium/caffeine intake, saturated fat and cholesterol, caloric balance, sufficient intake of fresh fruits, vegetables, and fiber.  -Stressed the importance of regular exercise.   -Substance Abuse: Discussed cessation/primary prevention of tobacco, alcohol, or other drug use; driving or other dangerous activities under the influence; availability of treatment for abuse.   -Injury prevention: Discussed safety belts, safety helmets, smoke detector, smoking near bedding or upholstery.    -Sexuality: Discussed sexually transmitted diseases, partner selection, use of condoms, avoidance of unintended pregnancy and contraceptive alternatives.   -Dental health: Discussed importance of regular tooth brushing, flossing, and dental visits.  -Health maintenance and immunizations reviewed. Please refer to Health maintenance section.  During the course of the visit the patient was educated and counseled about appropriate screening and preventive services including:        Fall prevention   Nutrition Physical Activity Weight Management Cognition  Return to care in 1 year for next preventative visit.     Subjective:  HPI:  Health Risk Assessment: Patient considers his overall health to be good. He has no difficulty performing the following: . Preparing food and eating . Bathing  . Getting dressed . Using the toilet . Shopping . Managing Finances . Moving around from place to place  He has not had any falls within the past year.   Depression screen PHQ 2/9 01/10/2020  Decreased Interest 0  Down, Depressed, Hopeless 0  PHQ - 2 Score 0   Lifestyle Factors: Diet: Balanced. Exercise: Going to [facility]. Likes golfing.   Patient Care Team: Vivi Barrack, MD as PCP - General (Family Medicine) Amaryllis Dyke, Harvie Bridge, MD (Internal Medicine)   ROS: Per HPI, otherwise a complete review of systems was negative.   PMH:  The following were reviewed and entered/updated in epic: Past Medical History:  Diagnosis Date  . BRADYCARDIA 06/01/2007  . Cataract    bil cataracts removed  . COPD (chronic obstructive pulmonary disease) ([place])   . EMPHYSEMA 03/28/2008  . Emphysema of lung ([place])   . HYPERGLYCEMIA 06/01/2007  . HYPERLIPIDEMIA 06/01/2007  .  Hypertension   . HYPERTENSION 01/19/2009   Patient Active Problem List   Diagnosis Date Noted  . Neck pain 12/30/2018  . GERD (gastroesophageal reflux disease) 09/28/2018  . PAD (peripheral artery disease) ([place]) 06/15/2018  . Former  smoker 06/15/2018  . History of colonic polyps 01/07/2018  . Nonspecific abnormal finding in stool contents 01/07/2018  . Allergic rhinitis 05/31/2013  . Urticaria 11/02/2011  . HTN (hypertension) 01/19/2009  . COPD, group A, by GOLD 2017 classification ([place]) 03/28/2008  . Dyslipidemia 06/01/2007  . Bradycardia 06/01/2007  . HYPERGLYCEMIA 06/01/2007   Past Surgical History:  Procedure Laterality Date  . COLONOSCOPY    . POLYPECTOMY    . TONSILLECTOMY AND ADENOIDECTOMY      Family History  Problem Relation Age of Onset  . Cancer Mother        "Male" cancer  . Uterine cancer Mother   . Heart disease Father   . Colon cancer Neg Hx   . Esophageal cancer Neg Hx   . Rectal cancer Neg Hx   . Stomach cancer Neg Hx   . Liver cancer Neg Hx   . Pancreatic cancer Neg Hx   . Prostate cancer Neg Hx     Medications- reviewed and updated Current Outpatient Medications  Medication Sig Dispense Refill  . aspirin 81 MG tablet 1/2 tablet by mouth daily    . EPINEPHrine (EPIPEN 2-PAK) 0.3 mg/0.3 mL IJ SOAJ injection Inject 0.3 mLs (0.3 mg total) into the muscle as needed for anaphylaxis. 1 each 0  . Fluticasone-Salmeterol (ADVAIR DISKUS) 250-50 MCG/DOSE AEPB inhale 1 dose by mouth twice a day 60 each 5  . rosuvastatin (CRESTOR) 10 MG tablet TAKE 1 TABLET BY MOUTH DAILY 90 tablet 3  . tiZANidine (ZANAFLEX) 2 MG tablet Take 1 tablet (2 mg total) by mouth every 6 (six) hours as needed for muscle spasms. 30 tablet 0  . triamterene-hydrochlorothiazide (MAXZIDE-25) 37.5-25 MG tablet Take 0.5 tablets by mouth daily. 45 tablet 3   No current facility-administered medications for this visit.    Allergies-reviewed and updated No Known Allergies  Social History   Socioeconomic History  . Marital status: Married    Spouse name: Not on file  . Number of children: Not on file  . Years of education: Not on file  . Highest education level: Not on file  Occupational History  . Occupation:  Retired   Tobacco Use  . Smoking status: Former Smoker    Packs/day: 0.50    Years: 40.00    Pack years: 20.00    Types: Cigarettes    Quit date: 09/26/2017    Years since quitting: 2.2  . Smokeless tobacco: Never Used  . Tobacco comment: stopped 2018  Substance and Sexual Activity  . Alcohol use: No  . Drug use: No  . Sexual activity: Not on file  Other Topics Concern  . Not on file  Social History Narrative  . Not on file   Social Determinants of Health   Financial Resource Strain:   . Difficulty of Paying Living Expenses:   Food Insecurity:   . Worried About Charity fundraiser in the Last Year:   . Arboriculturist in the Last Year:   Transportation Needs:   . Film/video editor (Medical):   Marland Kitchen Lack of Transportation (Non-Medical):   Physical Activity:   . Days of Exercise per Week:   . Minutes of Exercise per Session:   Stress:   . Feeling of Stress :  Social Connections:   . Frequency of Communication with Friends and Family:   . Frequency of Social Gatherings with Friends and Family:   . Attends Religious Services:   . Active Member of Clubs or Organizations:   . Attends Archivist Meetings:   Marland Kitchen Marital Status:         Objective:  Physical Exam: BP 118/62   Pulse 62   Temp 97.7 F (36.5 C)   Ht 5' 11"  (1.803 m)   Wt 209 lb 8 oz (95 kg)   SpO2 96%   BMI 29.22 kg/m   Body mass index is 29.22 kg/m. Wt Readings from Last 3 Encounters:  01/10/20 209 lb 8 oz (95 kg)  11/11/19 216 lb 12.8 oz (98.3 kg)  09/29/19 216 lb 12.8 oz (98.3 kg)   Gen: NAD, resting comfortably HEENT: TMs normal bilaterally. OP clear. No thyromegaly noted.  CV: RRR with no murmurs appreciated Pulm: NWOB, CTAB with no crackles, wheezes, or rhonchi GI: Normal bowel sounds present. Soft, Nontender, Nondistended. MSK: no edema, cyanosis, or clubbing noted Skin: warm, dry.  Approximately 1 cm erythematous lesion with rolled edges on left lateral calf with central  keratosis.  Neuro: CN2-12 grossly intact. Strength 5/5 in upper and lower extremities. Reflexes symmetric and intact bilaterally. Normal minicog.  Psych: Normal affect and thought content  Punch biopsy procedure note:  After informed verbal consent was obtained, using Betadine for cleansing and 1% Lidocaine with epinephrine for anesthetic, with sterile technique a 4 mm punch biopsy was used to obtain a biopsy specimen of the lesion. Hemostasis was obtained by pressure and wound was not sutured. Antibiotic dressing is applied, and wound care instructions provided. The specimen was labeled and sent to pathology for evaluation. The procedure was well tolerated without complications.       Algis Greenhouse. Jerline Pain, MD 01/10/2020 2:14 PM

## 2020-01-10 NOTE — Patient Instructions (Signed)
It was very nice to see you today!  We ill refer you to see Dr. Alvester Chou.  We will check blood work today.  We biopsied a spot on your leg.  This should come back in the next few days as well.  Come back in 1 year for your next physical, or sooner if needed.  Take care, Dr Jerline Pain  Please try these tips to maintain a healthy lifestyle:   Eat at least 3 REAL meals and 1-2 snacks per day.  Aim for no more than 5 hours between eating.  If you eat breakfast, please do so within one hour of getting up.    Each meal should contain half fruits/vegetables, one quarter protein, and one quarter carbs (no bigger than a computer mouse)   Cut down on sweet beverages. This includes juice, soda, and sweet tea.     Drink at least 1 glass of water with each meal and aim for at least 8 glasses per day   Exercise at least 150 minutes every week.    Preventive Care 38 Years and Older, Male Preventive care refers to lifestyle choices and visits with your health care provider that can promote health and wellness. This includes:  A yearly physical exam. This is also called an annual well check.  Regular dental and eye exams.  Immunizations.  Screening for certain conditions.  Healthy lifestyle choices, such as diet and exercise. What can I expect for my preventive care visit? Physical exam Your health care provider will check:  Height and weight. These may be used to calculate body mass index (BMI), which is a measurement that tells if you are at a healthy weight.  Heart rate and blood pressure.  Your skin for abnormal spots. Counseling Your health care provider may ask you questions about:  Alcohol, tobacco, and drug use.  Emotional well-being.  Home and relationship well-being.  Sexual activity.  Eating habits.  History of falls.  Memory and ability to understand (cognition).  Work and work Statistician. What immunizations do I need?  Influenza (flu) vaccine  This is  recommended every year. Tetanus, diphtheria, and pertussis (Tdap) vaccine  You may need a Td booster every 10 years. Varicella (chickenpox) vaccine  You may need this vaccine if you have not already been vaccinated. Zoster (shingles) vaccine  You may need this after age 68. Pneumococcal conjugate (PCV13) vaccine  One dose is recommended after age 33. Pneumococcal polysaccharide (PPSV23) vaccine  One dose is recommended after age 43. Measles, mumps, and rubella (MMR) vaccine  You may need at least one dose of MMR if you were born in 1957 or later. You may also need a second dose. Meningococcal conjugate (MenACWY) vaccine  You may need this if you have certain conditions. Hepatitis A vaccine  You may need this if you have certain conditions or if you travel or work in places where you may be exposed to hepatitis A. Hepatitis B vaccine  You may need this if you have certain conditions or if you travel or work in places where you may be exposed to hepatitis B. Haemophilus influenzae type b (Hib) vaccine  You may need this if you have certain conditions. You may receive vaccines as individual doses or as more than one vaccine together in one shot (combination vaccines). Talk with your health care provider about the risks and benefits of combination vaccines. What tests do I need? Blood tests  Lipid and cholesterol levels. These may be checked every 5 years,  or more frequently depending on your overall health.  Hepatitis C test.  Hepatitis B test. Screening  Lung cancer screening. You may have this screening every year starting at age 57 if you have a 30-pack-year history of smoking and currently smoke or have quit within the past 15 years.  Colorectal cancer screening. All adults should have this screening starting at age 34 and continuing until age 7. Your health care provider may recommend screening at age 60 if you are at increased risk. You will have tests every 1-10  years, depending on your results and the type of screening test.  Prostate cancer screening. Recommendations will vary depending on your family history and other risks.  Diabetes screening. This is done by checking your blood sugar (glucose) after you have not eaten for a while (fasting). You may have this done every 1-3 years.  Abdominal aortic aneurysm (AAA) screening. You may need this if you are a current or former smoker.  Sexually transmitted disease (STD) testing. Follow these instructions at home: Eating and drinking  Eat a diet that includes fresh fruits and vegetables, whole grains, lean protein, and low-fat dairy products. Limit your intake of foods with high amounts of sugar, saturated fats, and salt.  Take vitamin and mineral supplements as recommended by your health care provider.  Do not drink alcohol if your health care provider tells you not to drink.  If you drink alcohol: ? Limit how much you have to 0-2 drinks a day. ? Be aware of how much alcohol is in your drink. In the [place] one drink equals one 12 oz bottle of beer (355 mL), one 5 oz glass of wine (148 mL), or one 1 oz glass of hard liquor (44 mL). Lifestyle  Take daily care of your teeth and gums.  Stay active. Exercise for at least 30 minutes on 5 or more days each week.  Do not use any products that contain nicotine or tobacco, such as cigarettes, e-cigarettes, and chewing tobacco. If you need help quitting, ask your health care provider.  If you are sexually active, practice safe sex. Use a condom or other form of protection to prevent STIs (sexually transmitted infections).  Talk with your health care provider about taking a low-dose aspirin or statin. What's next?  Visit your health care provider once a year for a well check visit.  Ask your health care provider how often you should have your eyes and teeth checked.  Stay up to date on all vaccines. This information is not intended to replace  advice given to you by your health care provider. Make sure you discuss any questions you have with your health care provider. Document Revised: 09/23/2018 Document Reviewed: 09/23/2018 Elsevier Patient Education  2020 [employer].

## 2020-01-10 NOTE — Assessment & Plan Note (Signed)
Check A1c. 

## 2020-01-10 NOTE — Assessment & Plan Note (Addendum)
At goal.  Continue Maxide half tablet 37.5-25 once daily.  Check CBC, CMET, TSH.

## 2020-01-10 NOTE — Assessment & Plan Note (Signed)
Stable.  Continue Advair per pulmonology.

## 2020-01-10 NOTE — Assessment & Plan Note (Signed)
Check lipid panel.  Continue Crestor 10 mg daily. 

## 2020-01-12 ENCOUNTER — Telehealth: Payer: Self-pay | Admitting: Family Medicine

## 2020-01-12 ENCOUNTER — Other Ambulatory Visit: Payer: Self-pay

## 2020-01-12 DIAGNOSIS — C44719 Basal cell carcinoma of skin of left lower limb, including hip: Secondary | ICD-10-CM

## 2020-01-12 NOTE — Progress Notes (Signed)
Please inform patient of the following:  Blood work is all stable but his biopsy report showed basal cell carcinoma on his left leg. This is a type of skin cancer that will need to be completely excised. We can remove it here in our office or we can refer him to see the dermatologist for removal.  Bryan Armstrong. Jerline Pain, MD 01/12/2020 8:02 AM

## 2020-01-12 NOTE — Telephone Encounter (Signed)
Placed with copy of derm report for patient to pick up today

## 2020-01-12 NOTE — Telephone Encounter (Signed)
Called patient l/m with wife to call office I have printed copy of biopsy and not sure if he would like to pick up or have mailed.

## 2020-01-12 NOTE — Telephone Encounter (Signed)
Pt called stating he received his biopsy results today. Pt is asking to have a copy of the results or biopsy so he can send it to [facility]. Please advise.

## 2020-01-16 ENCOUNTER — Other Ambulatory Visit: Payer: Self-pay

## 2020-01-16 ENCOUNTER — Encounter: Payer: Self-pay | Admitting: Family Medicine

## 2020-01-16 ENCOUNTER — Telehealth: Payer: Self-pay | Admitting: *Deleted

## 2020-01-16 ENCOUNTER — Ambulatory Visit (INDEPENDENT_AMBULATORY_CARE_PROVIDER_SITE_OTHER): Payer: Medicare Other | Admitting: Family Medicine

## 2020-01-16 VITALS — BP 132/90 | HR 52 | Temp 98.3°F | Ht 71.0 in | Wt 211.4 lb

## 2020-01-16 DIAGNOSIS — L0390 Cellulitis, unspecified: Secondary | ICD-10-CM

## 2020-01-16 DIAGNOSIS — C44719 Basal cell carcinoma of skin of left lower limb, including hip: Secondary | ICD-10-CM

## 2020-01-16 MED ORDER — DOXYCYCLINE HYCLATE 100 MG PO TABS: 100.0000 mg | ORAL_TABLET | Freq: Two times a day (BID) | ORAL | 0 refills | Status: DC

## 2020-01-16 NOTE — Progress Notes (Signed)
   Bryan Armstrong is a 85 y.o. male who presents today for an office visit.  Assessment/Plan:  Cellulitis /basal cell carcinoma No signs of systemic infection.  Will start doxycycline.  Biopsy revealed basal cell carcinoma.  He will be following up with his dermatologist for full excision.    Subjective:  HPI:  Patient had punch biopsy performed on left leg 6 days ago.  Has noticed increasing redness and drainage to the area.  Will bit of pain as well.  No fevers or chills.       Objective:  Physical Exam: BP 132/90 (BP Location: Right Arm, Patient Position: Sitting, Cuff Size: Normal)   Pulse (!) 52   Temp 98.3 F (36.8 C) (Temporal)   Ht 5\' 11"  (1.803 m)   Wt 211 lb 6.4 oz (95.9 kg)   SpO2 94%   BMI 29.48 kg/m   Gen: No acute distress, resting comfortably Skin: Granulation tissue present at punch biopsy site.  Mild amount of surrounding erythema with serosanguineous drainage.     Bryan Armstrong. Jerline Pain, MD 01/16/2020 3:33 PM

## 2020-01-16 NOTE — Telephone Encounter (Signed)
Recommend OV today if possible.  Algis Greenhouse. Jerline Pain, MD 01/16/2020 11:33 AM

## 2020-01-16 NOTE — Telephone Encounter (Signed)
Appt scheduled for today.

## 2020-01-16 NOTE — Addendum Note (Signed)
Addended by: Vivi Barrack on: 01/16/2020 03:33 PM   Modules accepted: Level of Service

## 2020-01-16 NOTE — Patient Instructions (Signed)
It was very nice to see you today!  Please start the doxycycline.  Please call your dermatologist soon as possible to have the lesion entirely removed.  Take care, Dr Jerline Pain  Please try these tips to maintain a healthy lifestyle:   Eat at least 3 REAL meals and 1-2 snacks per day.  Aim for no more than 5 hours between eating.  If you eat breakfast, please do so within one hour of getting up.    Each meal should contain half fruits/vegetables, one quarter protein, and one quarter carbs (no bigger than a computer mouse)   Cut down on sweet beverages. This includes juice, soda, and sweet tea.     Drink at least 1 glass of water with each meal and aim for at least 8 glasses per day   Exercise at least 150 minutes every week.

## 2020-01-16 NOTE — Telephone Encounter (Signed)
Patient call state area where Bx was performed is swelling, swelling running down to the leg. Area is warm to the touch, Yesterday, clear liquid came out as patient squeezed it. Pt maintain Bx area cover since procedure  No appointment made yet with Dermatology   Please advise

## 2020-01-17 ENCOUNTER — Ambulatory Visit: Payer: Medicare Other | Admitting: Family Medicine

## 2020-01-20 DIAGNOSIS — C44719 Basal cell carcinoma of skin of left lower limb, including hip: Secondary | ICD-10-CM | POA: Diagnosis not present

## 2020-02-07 ENCOUNTER — Ambulatory Visit: Payer: Medicare Other | Admitting: Cardiovascular Disease

## 2020-02-07 ENCOUNTER — Encounter: Payer: Self-pay | Admitting: Cardiovascular Disease

## 2020-02-07 ENCOUNTER — Other Ambulatory Visit: Payer: Self-pay

## 2020-02-07 VITALS — BP 116/74 | HR 60 | Temp 97.4°F | Resp 20 | Ht 71.0 in | Wt 206.2 lb

## 2020-02-07 DIAGNOSIS — E785 Hyperlipidemia, unspecified: Secondary | ICD-10-CM

## 2020-02-07 DIAGNOSIS — I739 Peripheral vascular disease, unspecified: Secondary | ICD-10-CM

## 2020-02-07 DIAGNOSIS — I10 Essential (primary) hypertension: Secondary | ICD-10-CM

## 2020-02-07 NOTE — Progress Notes (Signed)
02/07/2020 Bryan Armstrong   March 29, 1935  [id number]  Primary Physician Vivi Barrack, MD Primary Cardiologist: Lorretta Harp MD Lupe Carney, [place]  HPI:  Bryan Armstrong is a 85 y.o. married Caucasian male whose wife Bryan Armstrong is also a patient of mine.  He is the father of 2, grandfather for grandchildren.  He was referred by Dr. Dimas Chyle for evaluation of symptomatic PAD.  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  His risk factors include 25-pack-year tobacco abuse having quit 5 years ago, treated hypertension, diabetes and hyperlipidemia.  There is no family history of heart disease.  Is never had a heart attack or stroke.  He denies chest pain or shortness of breath .   Current Meds  Medication Sig  . aspirin 81 MG tablet 1/2 tablet by mouth daily  . doxycycline (VIBRA-TABS) 100 MG tablet Take 1 tablet (100 mg total) by mouth 2 (two) times daily.  Marland Kitchen EPINEPHrine (EPIPEN 2-PAK) 0.3 mg/0.3 mL IJ SOAJ injection Inject 0.3 mLs (0.3 mg total) into the muscle as needed for anaphylaxis.  Marland Kitchen Fluticasone-Salmeterol (ADVAIR DISKUS) 250-50 MCG/DOSE AEPB inhale 1 dose by mouth twice a day  . rosuvastatin (CRESTOR) 10 MG tablet TAKE 1 TABLET BY MOUTH DAILY  . tiZANidine (ZANAFLEX) 2 MG tablet Take 1 tablet (2 mg total) by mouth every 6 (six) hours as needed for muscle spasms.  Marland Kitchen triamterene-hydrochlorothiazide (MAXZIDE-25) 37.5-25 MG tablet Take 0.5 tablets by mouth daily.     No Known Allergies  Social History   Socioeconomic History  . Marital status: Married    Spouse name: Not on file  . Number of children: Not on file  . Years of education: Not on file  . Highest education Armstrong: Not on file  Occupational History  . Occupation: Retired   Tobacco Use  . Smoking status: Former Smoker    Packs/day: 0.50    Years: 40.00    Pack years: 20.00    Types: Cigarettes    Quit date: 09/26/2017    Years since  quitting: 2.3  . Smokeless tobacco: Never Used  . Tobacco comment: stopped 2018  Substance and Sexual Activity  . Alcohol use: No  . Drug use: No  . Sexual activity: Not on file  Other Topics Concern  . Not on file  Social History Narrative  . Not on file   Social Determinants of Health   Financial Resource Strain:   . Difficulty of Paying Living Expenses:   Food Insecurity:   . Worried About Charity fundraiser in the Last Year:   . Arboriculturist in the Last Year:   Transportation Needs:   . Film/video editor (Medical):   Marland Kitchen Lack of Transportation (Non-Medical):   Physical Activity:   . Days of Exercise per Week:   . Minutes of Exercise per Session:   Stress:   . Feeling of Stress :   Social Connections:   . Frequency of Communication with Friends and Family:   . Frequency of Social Gatherings with Friends and Family:   . Attends Religious Services:   . Active Member of Clubs or Organizations:   . Attends Archivist Meetings:   Marland Kitchen Marital Status:   Intimate Partner Violence:   . Fear of Current or Ex-Partner:   . Emotionally Abused:   Marland Kitchen Physically Abused:   . Sexually Abused:  Review of Systems: General: negative for chills, fever, night sweats or weight changes.  Cardiovascular: negative for chest pain, dyspnea on exertion, edema, orthopnea, palpitations, paroxysmal nocturnal dyspnea or shortness of breath Dermatological: negative for rash Respiratory: negative for cough or wheezing Urologic: negative for hematuria Abdominal: negative for nausea, vomiting, diarrhea, bright red blood per rectum, melena, or hematemesis Neurologic: negative for visual changes, syncope, or dizziness All other systems reviewed and are otherwise negative except as noted above.    Blood pressure 116/74, pulse 60, temperature (!) 97.4 F (36.3 C), resp. rate 20, height 5\' 11"  (1.803 m), weight 206 lb 3.2 oz (93.5 kg), SpO2 99 %.  General appearance: alert and no  distress Neck: no adenopathy, no carotid bruit, no JVD, supple, symmetrical, trachea midline and thyroid not enlarged, symmetric, no tenderness/mass/nodules Lungs: clear to auscultation bilaterally Heart: regular rate and rhythm, S1, S2 normal, no murmur, click, rub or gallop Extremities: extremities normal, atraumatic, no cyanosis or edema Pulses: 1+ right, 2+ left common femoral pulses Skin: Skin color, texture, turgor normal. No rashes or lesions Neurologic: Alert and oriented X 3, normal strength and tone. Normal symmetric reflexes. Normal coordination and gait  EKG sinus rhythm at 68 with right bundle branch block and left axis deviation.  I personally reviewed this EKG.  ASSESSMENT AND PLAN:   Dyslipidemia History of hyperlipidemia on statin therapy with lipid profile performed 01/10/2020 revealing total cholesterol 101, LDL 49 and HDL 31.  HTN (hypertension) History of essential hypertension a blood pressure measured today at 116/74.  He is on Maxide.  Peripheral arterial disease ([place]) History of PAD with 2-year history of lifestyle limiting bilateral calf claudication.  Angina obtain lower extreme arterial Doppler studies to further evaluate the extent of his PAD and decide whether or not to proceed with angiography and potential intervention.      Lorretta Harp MD FACP,FACC,FAHA, [facility] 02/07/2020 2:41 PM

## 2020-02-07 NOTE — Assessment & Plan Note (Signed)
History of essential hypertension a blood pressure measured today at 116/74.  He is on Maxide.

## 2020-02-07 NOTE — Assessment & Plan Note (Signed)
History of PAD with 2-year history of lifestyle limiting bilateral calf claudication.  Angina obtain lower extreme arterial Doppler studies to further evaluate the extent of his PAD and decide whether or not to proceed with angiography and potential intervention.

## 2020-02-07 NOTE — Assessment & Plan Note (Signed)
History of hyperlipidemia on statin therapy with lipid profile performed 01/10/2020 revealing total cholesterol 101, LDL 49 and HDL 31.

## 2020-02-07 NOTE — Patient Instructions (Signed)
Medication Instructions:  The current medical regimen is effective;  continue present plan and medications.  *If you need a refill on your cardiac medications before your next appointment, please call your pharmacy*   Testing/Procedures: Your physician has requested that you have a lower or upper extremity arterial duplex. This test is an ultrasound of the arteries in the legs or arms. It looks at arterial blood flow in the legs and arms. Allow one hour for Lower and Upper Arterial scans. There are no restrictions or special instructions  Aorta/IVC/Iliac Ultrasound    Follow-Up: At [facility], you and your health needs are our priority.  As part of our continuing mission to provide you with exceptional heart care, we have created designated Provider Care Teams.  These Care Teams include your primary Cardiologist (physician) and Advanced Practice Providers (APPs -  Physician Assistants and Nurse Practitioners) who all work together to provide you with the care you need, when you need it.  We recommend signing up for the patient portal called "MyChart".  Sign up information is provided on this After Visit Summary.  MyChart is used to connect with patients for Virtual Visits (Telemedicine).  Patients are able to view lab/test results, encounter notes, upcoming appointments, etc.  Non-urgent messages can be sent to your provider as well.   To learn more about what you can do with MyChart, go to [email or web address].    Your next appointment:   3 month(s)  The format for your next appointment:   In Person  Provider:   Quay Burow, MD

## 2020-02-22 ENCOUNTER — Ambulatory Visit (HOSPITAL_COMMUNITY)
Admission: RE | Admit: 2020-02-22 | Discharge: 2020-02-22 | Disposition: A | Discharge status: Home / Self Care | Payer: Medicare Other | Source: Ambulatory Visit | Attending: Cardiovascular Disease | Admitting: Cardiovascular Disease

## 2020-02-22 ENCOUNTER — Telehealth: Payer: Self-pay | Admitting: Cardiovascular Disease

## 2020-02-22 ENCOUNTER — Other Ambulatory Visit: Payer: Self-pay

## 2020-02-22 ENCOUNTER — Ambulatory Visit (HOSPITAL_BASED_OUTPATIENT_CLINIC_OR_DEPARTMENT_OTHER)
Admission: RE | Admit: 2020-02-22 | Discharge: 2020-02-22 | Disposition: A | Discharge status: Home / Self Care | Payer: Medicare Other | Source: Ambulatory Visit | Attending: Cardiovascular Disease | Admitting: Cardiovascular Disease

## 2020-02-22 DIAGNOSIS — I739 Peripheral vascular disease, unspecified: Secondary | ICD-10-CM | POA: Insufficient documentation

## 2020-02-22 NOTE — Telephone Encounter (Signed)
Returned call to patient appointment scheduled with Dr.Berry 02/28/20 at 10:00 am to discuss lower ext doppler results.

## 2020-02-22 NOTE — Telephone Encounter (Signed)
Patient returned called his test results.

## 2020-02-28 ENCOUNTER — Ambulatory Visit: Payer: Medicare Other | Admitting: Cardiovascular Disease

## 2020-02-28 ENCOUNTER — Other Ambulatory Visit: Payer: Self-pay

## 2020-02-28 ENCOUNTER — Encounter: Payer: Self-pay | Admitting: Cardiovascular Disease

## 2020-02-28 VITALS — HR 58 | Temp 97.1°F | Ht 71.0 in | Wt 207.8 lb

## 2020-02-28 DIAGNOSIS — Z01810 Encounter for preprocedural cardiovascular examination: Secondary | ICD-10-CM

## 2020-02-28 DIAGNOSIS — I739 Peripheral vascular disease, unspecified: Secondary | ICD-10-CM

## 2020-02-28 LAB — CBC
Hematocrit: 42.6 % (ref 37.5–51.0)
Hemoglobin: 14.2 g/dL (ref 13.0–17.7)
MCH: 29.6 pg (ref 26.6–33.0)
MCHC: 33.3 g/dL (ref 31.5–35.7)
MCV: 89 fL (ref 79–97)
Platelets: 201 10*3/uL (ref 150–450)
RBC: 4.79 x10E6/uL (ref 4.14–5.80)
RDW: 12.9 % (ref 11.6–15.4)
WBC: 7.3 10*3/uL (ref 3.4–10.8)

## 2020-02-28 LAB — BASIC METABOLIC PANEL
BUN/Creatinine Ratio: 19 (ref 10–24)
BUN: 19 mg/dL (ref 8–27)
CO2: 24 mmol/L (ref 20–29)
Calcium: 9 mg/dL (ref 8.6–10.2)
Chloride: 103 mmol/L (ref 96–106)
Creatinine, Ser: 0.98 mg/dL (ref 0.76–1.27)
GFR calc Af Amer: 82 mL/min/{1.73_m2} (ref >59)
GFR calc non Af Amer: 71 mL/min/{1.73_m2} (ref >59)
Glucose: 102 mg/dL — ABNORMAL HIGH (ref 65–99)
Potassium: 4.4 mmol/L (ref 3.5–5.2)
Sodium: 139 mmol/L (ref 134–144)

## 2020-02-28 LAB — TSH: TSH: 2.49 u[IU]/mL (ref 0.450–4.500)

## 2020-02-28 NOTE — Progress Notes (Signed)
Bryan Armstrong returns today for follow-up.  He was initially referred to me for claudication.  He has bilateral calf claudication.  He had lower extremity arterial Doppler studies performed 02/22/2020 revealing a right ABI of 0.79 and left of 0.59.  He appears to have a distal right common femoral artery stenosis and a total left SFA and wishes to proceed with outpatient diagnostic peripheral angiography and potential endovascular therapy for lifestyle limiting claudication.  Lorretta Harp, M.D., [place], [facility], Laver[place] [address] [place], [place]  [address]  [phone] 02/28/2020 10:28 AM

## 2020-02-28 NOTE — Patient Instructions (Addendum)
Medication Instructions:  Your physician recommends that you continue on your current medications as directed. Please refer to the Current Medication list given to you today.  *If you need a refill on your cardiac medications before your next appointment, please call your pharmacy*  Lab Work: NONE ordered at this time of appointment   If you have labs (blood work) drawn today and your tests are completely normal, you will receive your results only by: Marland Kitchen MyChart Message (if you have MyChart) OR . A paper copy in the mail If you have any lab test that is abnormal or we need to change your treatment, we will call you to review the results.  Testing/Procedures: Your physician has requested that you have a lower extremity arterial exercise duplex. During this test, exercise and ultrasound are used to evaluate arterial blood flow in the legs. Allow one hour for this exam. There are no restrictions or special instructions.   Your physician has requested that you have an ankle brachial index (ABI). During this test an ultrasound and blood pressure cuff are used to evaluate the arteries that supply the arms and legs with blood. Allow thirty minutes for this exam. There are no restrictions or special instructions.  Please schedule for 1 week after procedure   Follow-Up: At [facility], you and your health needs are our priority.  As part of our continuing mission to provide you with exceptional heart care, we have created designated Provider Care Teams.  These Care Teams include your primary Cardiologist (physician) and Advanced Practice Providers (APPs -  Physician Assistants and Nurse Practitioners) who all work together to provide you with the care you need, when you need it.  We recommend signing up for the patient portal called "MyChart".  Sign up information is provided on this After Visit Summary.  MyChart is used to connect with patients for Virtual Visits (Telemedicine).  Patients are able  to view lab/test results, encounter notes, upcoming appointments, etc.  Non-urgent messages can be sent to your provider as well.   To learn more about what you can do with MyChart, go to [email or web address].    Your next appointment:   2 week(s)  The format for your next appointment:   In Person  Provider:   Quay Burow, MD  Other Instructions

## 2020-02-28 NOTE — H&P (View-Only) (Signed)
Bryan Armstrong returns today for follow-up.  He was initially referred to me for claudication.  He has bilateral calf claudication.  He had lower extremity arterial Doppler studies performed 02/22/2020 revealing a right ABI of 0.79 and left of 0.59.  He appears to have a distal right common femoral artery stenosis and a total left SFA and wishes to proceed with outpatient diagnostic peripheral angiography and potential endovascular therapy for lifestyle limiting claudication.  Lorretta Harp, M.D., [place], [facility], Laverta [place] [address] [place], [place]  [address]  [phone] 02/28/2020 10:28 AM

## 2020-03-01 ENCOUNTER — Telehealth: Payer: Self-pay | Admitting: *Deleted

## 2020-03-01 NOTE — Telephone Encounter (Addendum)
Pt contacted pre-abdominal aortogram scheduled at [facility] for: Monday Mar 05, 2020 9:30 AM Verified arrival time and place: [facility]) at: 7:30 AM   No solid food after midnight prior to cath, clear liquids until 5 AM day of procedure.  Hold: Triamterene-HCT-AM of procedure  Except hold medications AM meds can be  taken pre-cath with sip of water including: ASA 81 mg   Confirmed patient has responsible adult to drive home post procedure and observe 24 hours after arriving home:   You are allowed ONE visitor in the waiting room during your procedure. Both you and your visitor must wear masks.      COVID-19 Pre-Screening Questions:  . In the past 7 to 10 days have you had a cough,  shortness of breath, headache, congestion, fever (100 or greater) body aches, chills, sore throat, or sudden loss of taste or sense of smell? . Have you been around anyone with known Covid 19 in the past 7 to 10 days? . Have you been around anyone who is awaiting Covid 19 test results in the past 7 to 10 days? . Have you been around anyone who has mentioned symptoms of Covid 19 within the past 7 to 10 days?   LMTCB to review procedure instructions with patient.

## 2020-03-02 ENCOUNTER — Other Ambulatory Visit (HOSPITAL_COMMUNITY)
Admission: RE | Admit: 2020-03-02 | Discharge: 2020-03-02 | Disposition: A | Discharge status: Home / Self Care | Payer: Medicare Other | Source: Ambulatory Visit | Attending: Cardiovascular Disease | Admitting: Cardiovascular Disease

## 2020-03-02 DIAGNOSIS — Z01812 Encounter for preprocedural laboratory examination: Secondary | ICD-10-CM | POA: Diagnosis not present

## 2020-03-02 DIAGNOSIS — Z20822 Contact with and (suspected) exposure to covid-19: Secondary | ICD-10-CM | POA: Insufficient documentation

## 2020-03-02 LAB — SARS CORONAVIRUS 2 (TAT 6-24 HRS): SARS Coronavirus 2: NEGATIVE

## 2020-03-05 ENCOUNTER — Encounter (HOSPITAL_COMMUNITY)
Admission: RE | Disposition: A | Discharge status: Home / Self Care | Payer: Self-pay | Source: Home / Self Care | Attending: Cardiovascular Disease

## 2020-03-05 ENCOUNTER — Other Ambulatory Visit: Payer: Self-pay

## 2020-03-05 ENCOUNTER — Ambulatory Visit (HOSPITAL_COMMUNITY)
Admission: RE | Admit: 2020-03-05 | Discharge: 2020-03-05 | Disposition: A | Discharge status: Home / Self Care | Payer: Medicare Other | Attending: Cardiovascular Disease | Admitting: Cardiovascular Disease

## 2020-03-05 DIAGNOSIS — E785 Hyperlipidemia, unspecified: Secondary | ICD-10-CM | POA: Diagnosis not present

## 2020-03-05 DIAGNOSIS — J439 Emphysema, unspecified: Secondary | ICD-10-CM | POA: Diagnosis not present

## 2020-03-05 DIAGNOSIS — I70211 Atherosclerosis of native arteries of extremities with intermittent claudication, right leg: Secondary | ICD-10-CM | POA: Diagnosis not present

## 2020-03-05 DIAGNOSIS — E1151 Type 2 diabetes mellitus with diabetic peripheral angiopathy without gangrene: Secondary | ICD-10-CM | POA: Diagnosis not present

## 2020-03-05 DIAGNOSIS — I10 Essential (primary) hypertension: Secondary | ICD-10-CM | POA: Insufficient documentation

## 2020-03-05 DIAGNOSIS — Z7982 Long term (current) use of aspirin: Secondary | ICD-10-CM | POA: Diagnosis not present

## 2020-03-05 DIAGNOSIS — Z79899 Other long term (current) drug therapy: Secondary | ICD-10-CM | POA: Diagnosis not present

## 2020-03-05 DIAGNOSIS — E1136 Type 2 diabetes mellitus with diabetic cataract: Secondary | ICD-10-CM | POA: Insufficient documentation

## 2020-03-05 DIAGNOSIS — Z8249 Family history of ischemic heart disease and other diseases of the circulatory system: Secondary | ICD-10-CM | POA: Insufficient documentation

## 2020-03-05 DIAGNOSIS — Z87891 Personal history of nicotine dependence: Secondary | ICD-10-CM | POA: Diagnosis not present

## 2020-03-05 DIAGNOSIS — J449 Chronic obstructive pulmonary disease, unspecified: Secondary | ICD-10-CM | POA: Diagnosis not present

## 2020-03-05 DIAGNOSIS — I70213 Atherosclerosis of native arteries of extremities with intermittent claudication, bilateral legs: Secondary | ICD-10-CM | POA: Insufficient documentation

## 2020-03-05 DIAGNOSIS — Z7951 Long term (current) use of inhaled steroids: Secondary | ICD-10-CM | POA: Diagnosis not present

## 2020-03-05 DIAGNOSIS — I739 Peripheral vascular disease, unspecified: Secondary | ICD-10-CM | POA: Diagnosis present

## 2020-03-05 HISTORY — PX: ABDOMINAL AORTOGRAM W/LOWER EXTREMITY: CATH118223

## 2020-03-05 SURGERY — ABDOMINAL AORTOGRAM W/LOWER EXTREMITY
Anesthesia: LOCAL

## 2020-03-05 MED ORDER — MORPHINE SULFATE (PF) 2 MG/ML IV SOLN: 2.0000 mg | INTRAVENOUS | Status: DC | PRN

## 2020-03-05 MED ORDER — IODIXANOL 320 MG/ML IV SOLN
INTRAVENOUS | Status: DC | PRN
  Administered 2020-03-05: 117 mL via INTRA_ARTERIAL

## 2020-03-05 MED ORDER — SODIUM CHLORIDE 0.9% FLUSH: 3.0000 mL | Freq: Two times a day (BID) | INTRAVENOUS | Status: DC

## 2020-03-05 MED ORDER — SODIUM CHLORIDE 0.9 % IV SOLN: INTRAVENOUS | Status: AC

## 2020-03-05 MED ORDER — ASPIRIN EC 81 MG PO TBEC: 81.0000 mg | DELAYED_RELEASE_TABLET | Freq: Every day | ORAL | Status: DC

## 2020-03-05 MED ORDER — HYDRALAZINE HCL 20 MG/ML IJ SOLN
INTRAMUSCULAR | Status: AC
  Filled 2020-03-05: qty 1

## 2020-03-05 MED ORDER — HEPARIN (PORCINE) IN NACL 1000-0.9 UT/500ML-% IV SOLN
INTRAVENOUS | Status: AC
  Filled 2020-03-05: qty 1000

## 2020-03-05 MED ORDER — ATORVASTATIN CALCIUM 80 MG PO TABS: 80.0000 mg | ORAL_TABLET | Freq: Every day | ORAL | Status: DC

## 2020-03-05 MED ORDER — SODIUM CHLORIDE 0.9 % IV SOLN: 250.0000 mL | INTRAVENOUS | Status: DC | PRN

## 2020-03-05 MED ORDER — LIDOCAINE HCL (PF) 1 % IJ SOLN
INTRAMUSCULAR | Status: DC | PRN
  Administered 2020-03-05: 20 mL

## 2020-03-05 MED ORDER — HYDRALAZINE HCL 20 MG/ML IJ SOLN
INTRAMUSCULAR | Status: DC | PRN
  Administered 2020-03-05: 10 mg via INTRAVENOUS

## 2020-03-05 MED ORDER — SODIUM CHLORIDE 0.9% FLUSH: 3.0000 mL | INTRAVENOUS | Status: DC | PRN

## 2020-03-05 MED ORDER — ONDANSETRON HCL 4 MG/2ML IJ SOLN: 4.0000 mg | Freq: Four times a day (QID) | INTRAMUSCULAR | Status: DC | PRN

## 2020-03-05 MED ORDER — ASPIRIN 81 MG PO CHEW
81.0000 mg | CHEWABLE_TABLET | ORAL | Status: AC
  Administered 2020-03-05: 81 mg via ORAL
  Filled 2020-03-05: qty 1

## 2020-03-05 MED ORDER — HEPARIN (PORCINE) IN NACL 1000-0.9 UT/500ML-% IV SOLN
INTRAVENOUS | Status: DC | PRN
  Administered 2020-03-05 (×2): 500 mL

## 2020-03-05 MED ORDER — SODIUM CHLORIDE 0.9 % WEIGHT BASED INFUSION
3.0000 mL/kg/h | INTRAVENOUS | Status: AC
  Administered 2020-03-05: 3 mL/kg/h via INTRAVENOUS

## 2020-03-05 MED ORDER — LABETALOL HCL 5 MG/ML IV SOLN: 10.0000 mg | INTRAVENOUS | Status: DC | PRN

## 2020-03-05 MED ORDER — SODIUM CHLORIDE 0.9 % WEIGHT BASED INFUSION: 1.0000 mL/kg/h | INTRAVENOUS | Status: DC

## 2020-03-05 MED ORDER — HYDRALAZINE HCL 20 MG/ML IJ SOLN
5.0000 mg | INTRAMUSCULAR | Status: DC | PRN
  Administered 2020-03-05: 5 mg via INTRAVENOUS

## 2020-03-05 MED ORDER — ACETAMINOPHEN 325 MG PO TABS: 650.0000 mg | ORAL_TABLET | ORAL | Status: DC | PRN

## 2020-03-05 MED ORDER — LIDOCAINE HCL (PF) 1 % IJ SOLN
INTRAMUSCULAR | Status: AC
  Filled 2020-03-05: qty 30

## 2020-03-05 SURGICAL SUPPLY — 10 items
CATH ANGIO 5F PIGTAIL 65CM (CATHETERS) ×1 IMPLANT
GUIDEWIRE ANGLED .035X150CM (WIRE) ×1 IMPLANT
KIT PV (KITS) ×2 IMPLANT
SHEATH PINNACLE 5F 10CM (SHEATH) ×1 IMPLANT
SHEATH PROBE COVER 6X72 (BAG) ×1 IMPLANT
SYR MEDRAD MARK 7 150ML (SYRINGE) ×2 IMPLANT
SYR MEDRAD MARK V 150ML (SYRINGE) IMPLANT
TRANSDUCER W/STOPCOCK (MISCELLANEOUS) ×2 IMPLANT
TRAY PV CATH (CUSTOM PROCEDURE TRAY) ×2 IMPLANT
WIRE HITORQ VERSACORE ST 145CM (WIRE) ×1 IMPLANT

## 2020-03-05 NOTE — Discharge Instructions (Signed)
Femoral Site Care This sheet gives you information about how to care for yourself after your procedure. Your health care provider may also give you more specific instructions. If you have problems or questions, contact your health care provider. What can I expect after the procedure?  After the procedure, it is common to have:  Bruising that usually fades within 1-2 weeks.  Tenderness at the site. Follow these instructions at home: Wound care 1. Follow instructions from your health care provider about how to take care of your insertion site. Make sure you: ? Wash your hands with soap and water before you change your bandage (dressing). If soap and water are not available, use hand sanitizer. ? Remove your dressing as told by your health care provider. In 24 hours 2. Do not take baths, swim, or use a hot tub until your health care provider approves. 3. You may shower 24-48 hours after the procedure or as told by your health care provider. ? Gently wash the site with plain soap and water. ? Pat the area dry with a clean towel. ? Do not rub the site. This may cause bleeding. 4. Do not apply powder or lotion to the site. Keep the site clean and dry. 5. Check your femoral site every day for signs of infection. Check for: ? Redness, swelling, or pain. ? Fluid or blood. ? Warmth. ? Pus or a bad smell. Activity 1. For the first 2-3 days after your procedure, or as long as directed: ? Avoid climbing stairs as much as possible. ? Do not squat. 2. Do not lift anything that is heavier than 10 lb (4.5 kg), or the limit that you are told, until your health care provider says that it is safe. For 5 days 3. Rest as directed. ? Avoid sitting for a long time without moving. Get up to take short walks every 1-2 hours. 4. Do not drive for 24 hours if you were given a medicine to help you relax (sedative). General instructions  Take over-the-counter and prescription medicines only as told by your  health care provider.  Keep all follow-up visits as told by your health care provider. This is important. Contact a health care provider if you have:  A fever or chills.  You have redness, swelling, or pain around your insertion site. Get help right away if:  The catheter insertion area swells very fast.  You pass out.  You suddenly start to sweat or your skin gets clammy.  The catheter insertion area is bleeding, and the bleeding does not stop when you hold steady pressure on the area.  The area near or just beyond the catheter insertion site becomes pale, cool, tingly, or numb. These symptoms may represent a serious problem that is an emergency. Do not wait to see if the symptoms will go away. Get medical help right away. Call your local emergency services (911 in the [place] Do not drive yourself to the hospital. Summary  After the procedure, it is common to have bruising that usually fades within 1-2 weeks.  Check your femoral site every day for signs of infection.  Do not lift anything that is heavier than 10 lb (4.5 kg), or the limit that you are told, until your health care provider says that it is safe. This information is not intended to replace advice given to you by your health care provider. Make sure you discuss any questions you have with your health care provider. Document Revised: 10/12/2017 Document Reviewed: 10/12/2017   Elsevier Patient Education  2020 Elsevier Inc[employer]   

## 2020-03-05 NOTE — Progress Notes (Signed)
Site area: Left groin a 5 french arterial sheath was removed by Raynelle Fanning RN  Site Prior to Removal:  Level 0  Pressure Applied For 20 MINUTES    Bedrest Beginning at 1230p X 4 hours  Manual:   Yes.    Patient Status During Pull:  stable  Post Pull Groin Site:  Level 0  Post Pull Instructions Given:  Yes.    Post Pull Pulses Present:  Yes.    Dressing Applied:  Yes.    Comments:

## 2020-03-05 NOTE — Consult Note (Signed)
Hospital Consult    Reason for Consult: Bilateral lower extremity claudication Referring Physician: Dr. Gwenlyn Found MRN #:  [id number]  History of Present Illness: This is a 85 y.o. male with symptomatic peripheral arterial disease.  Previously was a 25-pack-year tobacco smoker also has hypertension, hyperlipidemia, diabetes.  Denies any previous history of heart disease.  He has been seen in our office in the past for varicose veins.  His wife has also been treated by Dr. Oneida Alar in the past.  Currently he states that he can walk approximately from the cart path to the middle of the [place] but not much further he has to stop at that time this does prevent him from walking and playing golf.  He is able to cut his grass but has to stop and wait approximately 5 minutes after approximately 5 minutes of cutting.  He does not have any tissue loss or ulceration.  He has never had stroke TIA amaurosis.  Does not have any personal or family history of abdominal aortic aneurysm.  Past Medical History:  Diagnosis Date  . BRADYCARDIA 06/01/2007  . Cataract    bil cataracts removed  . COPD (chronic obstructive pulmonary disease) ([place])   . EMPHYSEMA 03/28/2008  . Emphysema of lung ([place])   . HYPERGLYCEMIA 06/01/2007  . HYPERLIPIDEMIA 06/01/2007  . Hypertension   . HYPERTENSION 01/19/2009    Past Surgical History:  Procedure Laterality Date  . COLONOSCOPY    . POLYPECTOMY    . TONSILLECTOMY AND ADENOIDECTOMY      No Known Allergies  Prior to Admission medications   Medication Sig Start Date End Date Taking? Authorizing Provider  aspirin 81 MG tablet 1/2 tablet by mouth daily   Yes [provider]  EPINEPHrine (EPIPEN 2-PAK) 0.3 mg/0.3 mL IJ SOAJ injection Inject 0.3 mLs (0.3 mg total) into the muscle as needed for anaphylaxis. 05/13/19  Yes Vivi Barrack, MD  Fluticasone-Salmeterol (ADVAIR DISKUS) 250-50 MCG/DOSE AEPB inhale 1 dose by mouth twice a day Patient taking differently: Inhale 1 puff  into the lungs daily.  09/30/19  Yes Mannam, Praveen, MD  rosuvastatin (CRESTOR) 10 MG tablet TAKE 1 TABLET BY MOUTH DAILY Patient taking differently: Take 10 mg by mouth daily.  03/08/19  Yes Vivi Barrack, MD  triamterene-hydrochlorothiazide (MAXZIDE-25) 37.5-25 MG tablet Take 0.5 tablets by mouth daily. Patient taking differently: Take 0.5 tablets by mouth daily as needed (High blood pressure).  01/18/19  Yes Vivi Barrack, MD  doxycycline (VIBRA-TABS) 100 MG tablet Take 1 tablet (100 mg total) by mouth 2 (two) times daily. Patient not taking: Reported on 02/29/2020 01/16/20   Vivi Barrack, MD  tiZANidine (ZANAFLEX) 2 MG tablet Take 1 tablet (2 mg total) by mouth every 6 (six) hours as needed for muscle spasms. Patient not taking: Reported on 02/29/2020 12/31/18   Vivi Barrack, MD    Social History   Socioeconomic History  . Marital status: Married    Spouse name: Not on file  . Number of children: Not on file  . Years of education: Not on file  . Highest education level: Not on file  Occupational History  . Occupation: Retired   Tobacco Use  . Smoking status: Former Smoker    Packs/day: 0.50    Years: 40.00    Pack years: 20.00    Types: Cigarettes    Quit date: 09/26/2017    Years since quitting: 2.4  . Smokeless tobacco: Never Used  . Tobacco comment: stopped 2018  Substance and Sexual Activity  . Alcohol use: No  . Drug use: No  . Sexual activity: Not on file  Other Topics Concern  . Not on file  Social History Narrative  . Not on file   Social Determinants of Health   Financial Resource Strain:   . Difficulty of Paying Living Expenses:   Food Insecurity:   . Worried About Charity fundraiser in the Last Year:   . Arboriculturist in the Last Year:   Transportation Needs:   . Film/video editor (Medical):   Marland Kitchen Lack of Transportation (Non-Medical):   Physical Activity:   . Days of Exercise per Week:   . Minutes of Exercise per Session:   Stress:   .  Feeling of Stress :   Social Connections:   . Frequency of Communication with Friends and Family:   . Frequency of Social Gatherings with Friends and Family:   . Attends Religious Services:   . Active Member of Clubs or Organizations:   . Attends Archivist Meetings:   Marland Kitchen Marital Status:   Intimate Partner Violence:   . Fear of Current or Ex-Partner:   . Emotionally Abused:   Marland Kitchen Physically Abused:   . Sexually Abused:      Family History  Problem Relation Age of Onset  . Cancer Mother        "Male" cancer  . Uterine cancer Mother   . Heart disease Father   . Colon cancer Neg Hx   . Esophageal cancer Neg Hx   . Rectal cancer Neg Hx   . Stomach cancer Neg Hx   . Liver cancer Neg Hx   . Pancreatic cancer Neg Hx   . Prostate cancer Neg Hx     ROS:  Cardiovascular: []  chest pain/pressure []  palpitations []  SOB lying flat []  DOE [x]  pain in legs while walking []  pain in legs at rest []  pain in legs at night []  non-healing ulcers []  hx of DVT []  swelling in legs  Pulmonary: []  productive cough []  asthma/wheezing []  home O2  Neurologic: []  weakness in []  arms []  legs []  numbness in []  arms []  legs []  hx of CVA []  mini stroke [] difficulty speaking or slurred speech []  temporary loss of vision in one eye []  dizziness  Hematologic: []  hx of cancer []  bleeding problems []  problems with blood clotting easily  Endocrine:   []  diabetes []  thyroid disease  GI []  vomiting blood []  blood in stool  GU: []  CKD/renal failure []  HD--[]  M/W/F or []  T/T/S []  burning with urination []  blood in urine  Psychiatric: []  anxiety []  depression  Musculoskeletal: []  arthritis []  joint pain  Integumentary: []  rashes []  ulcers  Constitutional: []  fever []  chills   Physical Examination  Vitals:   03/05/20 1315 03/05/20 1330  BP: 114/65 123/68  Pulse: 65 67  Resp:    Temp:    SpO2: 96% 95%   Body mass index is 28.59 kg/m.  General:   nad Gait: Not observed HENT: WNL, normocephalic Pulmonary: normal non-labored breathing Cardiac: Palpable right common femoral artery Left common femoral artery there is a dressing in place No palpable popliteal pulses Abdomen:  soft, NT/ND, no masses Extremities: No tissue loss or ulceration Neurologic: A&O X 3; Appropriate Affect ; SENSATION: normal; MOTOR FUNCTION:  moving all extremities equally. Speech is fluent/normal   CBC    Component Value Date/Time   WBC 7.3 02/28/2020 1107   WBC 8.5 01/10/2020  1406   RBC 4.79 02/28/2020 1107   RBC 4.64 01/10/2020 1406   HGB 14.2 02/28/2020 1107   HCT 42.6 02/28/2020 1107   PLT 201 02/28/2020 1107   MCV 89 02/28/2020 1107   MCH 29.6 02/28/2020 1107   MCH 29.8 08/10/2011 0558   MCHC 33.3 02/28/2020 1107   MCHC 33.7 01/10/2020 1406   RDW 12.9 02/28/2020 1107   LYMPHSABS 1.7 12/15/2017 1458   MONOABS 0.6 12/15/2017 1458   EOSABS 0.4 12/15/2017 1458   BASOSABS 0.1 12/15/2017 1458    BMET    Component Value Date/Time   NA 139 02/28/2020 1107   K 4.4 02/28/2020 1107   CL 103 02/28/2020 1107   CO2 24 02/28/2020 1107   GLUCOSE 102 (H) 02/28/2020 1107   GLUCOSE 95 01/10/2020 1406   BUN 19 02/28/2020 1107   CREATININE 0.98 02/28/2020 1107   CALCIUM 9.0 02/28/2020 1107   GFRNONAA 71 02/28/2020 1107   GFRAA 82 02/28/2020 1107    COAGS: No results found for: INR, PROTIME   Non-Invasive Vascular Imaging:   Previously performed ABIs are 0.79 right and monophasic and 0.58 left and monophasic.  Toe pressure on the right 56 and left 54.  Bilateral lower extremity duplex Summary:  Right: 75-99% stenosis noted in the common femoral artery. 50-74% stenosis  noted in the superficial femoral artery. Irregular, calcified, shadowing  plaque noted in CFA down through extremity. Groin access may be difficult  due to extensive plaque.   Left: Total occlusion noted in the superficial femoral artery. Irregular,  calcified, shadowing  plaque noted in CFA down through extremity.    ASSESSMENT/PLAN: This is a 85 y.o. male with bilateral common femoral lesions.  I discussed with him common femoral endarterectomy.  He states that his right leg is actually the worst.  I reviewed his imaging from Dr. Gwenlyn Found today and agree that he would benefit from femoral endarterectomy after discussion of his life limiting claudication.  Patient would like to discuss this with Dr. Oneida Alar who has helped his wife in the past.  He does have a follow-up with one of our PAs in June.  I will switch this to Dr. Oneida Alar follow-up and they can discuss surgical intervention.  Zavon Hyson C. Donzetta Matters, MD [facility] Office: [phone] Pager: [phone]

## 2020-03-05 NOTE — Interval H&P Note (Signed)
History and Physical Interval Note:  03/05/2020 10:51 AM  Bryan Armstrong  has presented today for surgery, with the diagnosis of Peripheral Artery Disease.  The various methods of treatment have been discussed with the patient and family. After consideration of risks, benefits and other options for treatment, the patient has consented to  Procedure(s): ABDOMINAL AORTOGRAM W/  LOWER EXTREMITY (N/A) as a surgical intervention.  The patient's history has been reviewed, patient examined, no change in status, stable for surgery.  I have reviewed the patient's chart and labs.  Questions were answered to the patient's satisfaction.     Quay Burow

## 2020-03-05 NOTE — Telephone Encounter (Signed)
Pt at hospital for procedure. °

## 2020-03-06 ENCOUNTER — Telehealth: Payer: Self-pay | Admitting: Cardiovascular Disease

## 2020-03-06 NOTE — Telephone Encounter (Signed)
Patient had a procedure scheduled yesterday but it was not completed because the issue ended up being in his groin. He states that he is following up with Dr. Oneida Alar for that problem. He is wanting to cancel his appts on 03/14/20 and 03/15/20 because he is following up with Dr. Oneida Alar and those were to be follow ups to his procedure. He is keeping his appt with Dr. Gwenlyn Found on 03/21/20 because he states that he will then know what the issue with his groin is. I just want to make sure it is okay to cancel the tests that are on 03/14/20 and 03/15/20. Please advise.

## 2020-03-06 NOTE — Telephone Encounter (Signed)
Pt is asking to cancel his:   6/2 LE Arterial and 6/3 ABI due to needing appt with Dr. Oneida Alar on 03/15/20.   He is seeing Dr. Gwenlyn Found back in the office 03/21/20.   He wants to ask if Dr. Gwenlyn Found would mind if he cancelled those studies.   Will forward to Dr. Gwenlyn Found for review.

## 2020-03-08 NOTE — Telephone Encounter (Signed)
He can cancel his vascular studies.

## 2020-03-09 NOTE — Telephone Encounter (Signed)
Left message to call back  

## 2020-03-09 NOTE — Telephone Encounter (Signed)
Left detail message Dr Gwenlyn Found said patient did not need  to do vascular studies ,but  To keep appt with Dr Gwenlyn Found on 03/21/20 - after he sees Dr Oneida Alar

## 2020-03-14 ENCOUNTER — Inpatient Hospital Stay (HOSPITAL_COMMUNITY): Admission: RE | Admit: 2020-03-14 | Payer: Medicare Other | Source: Ambulatory Visit

## 2020-03-15 ENCOUNTER — Encounter: Payer: Self-pay | Admitting: General Practice

## 2020-03-15 ENCOUNTER — Encounter: Payer: Self-pay | Admitting: Vascular Surgery

## 2020-03-15 ENCOUNTER — Ambulatory Visit: Payer: Medicare Other | Admitting: Vascular Surgery

## 2020-03-15 ENCOUNTER — Encounter (HOSPITAL_COMMUNITY): Payer: Medicare Other

## 2020-03-15 ENCOUNTER — Other Ambulatory Visit: Payer: Self-pay

## 2020-03-15 ENCOUNTER — Telehealth: Payer: Self-pay | Admitting: Cardiovascular Disease

## 2020-03-15 VITALS — BP 158/89 | HR 56 | Temp 97.6°F | Resp 20 | Ht 71.0 in | Wt 206.0 lb

## 2020-03-15 DIAGNOSIS — I739 Peripheral vascular disease, unspecified: Secondary | ICD-10-CM

## 2020-03-15 NOTE — H&P (View-Only) (Signed)
Referring Physician: Dr Gwenlyn Found  Patient name: Bryan Armstrong MRN: [id number] DOB: 05-Oct-1935 Sex: male  REASON FOR CONSULT: Lifestyle limiting claudication right greater than left  HPI: Bryan Armstrong is a 85 y.o. male, who has been followed in our practice since 2018 for bilateral lower extremity claudication.  He believes his right leg may be slightly worse than the left.  He had bilateral ABIs performed in 2018 which were 0.7 on the right and left.  These were repeated on May 12 of this year and were 0.8 on the right 0.6 on the left.  Patient states that despite the fact his ABIs have remained relatively stable his walking distance has become shorter and shorter over time.  He is barely able to enjoy his golf game at this point because he is very limited in his walking distance.  He recently underwent an arteriogram by Dr. 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  He does not have rest pain.  He does not have tissue loss.  He does stay fairly busy helping to care for his wife who is almost bedridden at this point.  I recently did a peripheral intervention on her for a limb salvage and nonhealing wounds.  She is scheduled to see [place] in the office later today.  Other medical problems include COPD hyperlipidemia hypertension all of which have been stable.  He is a former smoker but quit 2018.  He is on a statin and aspirin.  Past Medical History:  Diagnosis Date  . BRADYCARDIA 06/01/2007  . Cataract    bil cataracts removed  . COPD (chronic obstructive pulmonary disease) ([place])   . EMPHYSEMA 03/28/2008  . Emphysema of lung ([place])   . HYPERGLYCEMIA 06/01/2007  . HYPERLIPIDEMIA 06/01/2007  . Hypertension   . HYPERTENSION 01/19/2009  . Peripheral arterial disease [facility])    Past Surgical History:  Procedure Laterality Date  . ABDOMINAL AORTOGRAM W/LOWER EXTREMITY N/A 03/05/2020   Procedure: ABDOMINAL AORTOGRAM W/  LOWER EXTREMITY;   Surgeon: Lorretta Harp, MD;  Location: [facility];  Service: Cardiovascular;  Laterality: N/A;  . COLONOSCOPY    . POLYPECTOMY    . TONSILLECTOMY AND ADENOIDECTOMY      Family History  Problem Relation Age of Onset  . Cancer Mother        "Male" cancer  . Uterine cancer Mother   . Heart disease Father   . Colon cancer Neg Hx   . Esophageal cancer Neg Hx   . Rectal cancer Neg Hx   . Stomach cancer Neg Hx   . Liver cancer Neg Hx   . Pancreatic cancer Neg Hx   . Prostate cancer Neg Hx     SOCIAL HISTORY: Social History   Socioeconomic History  . Marital status: Married    Spouse name: Not on file  . Number of children: Not on file  . Years of education: Not on file  . Highest education level: Not on file  Occupational History  . Occupation: Retired   Tobacco Use  . Smoking status: Former Smoker    Packs/day: 0.50    Years: 40.00    Pack years: 20.00    Types: Cigarettes    Quit date: 09/26/2017    Years since quitting: 2.4  . Smokeless tobacco: Never Used  . Tobacco comment: stopped 2018  Substance and Sexual Activity  . Alcohol use: No  . Drug use: No  .  Sexual activity: Not on file  Other Topics Concern  . Not on file  Social History Narrative  . Not on file   Social Determinants of Health   Financial Resource Strain:   . Difficulty of Paying Living Expenses:   Food Insecurity:   . Worried About Charity fundraiser in the Last Year:   . Arboriculturist in the Last Year:   Transportation Needs:   . Film/video editor (Medical):   Marland Kitchen Lack of Transportation (Non-Medical):   Physical Activity:   . Days of Exercise per Week:   . Minutes of Exercise per Session:   Stress:   . Feeling of Stress :   Social Connections:   . Frequency of Communication with Friends and Family:   . Frequency of Social Gatherings with Friends and Family:   . Attends Religious Services:   . Active Member of Clubs or Organizations:   . Attends Theatre manager Meetings:   Marland Kitchen Marital Status:   Intimate Partner Violence:   . Fear of Current or Ex-Partner:   . Emotionally Abused:   Marland Kitchen Physically Abused:   . Sexually Abused:     No Known Allergies  Current Outpatient Medications  Medication Sig Dispense Refill  . aspirin 81 MG tablet 1/2 tablet by mouth daily    . EPINEPHrine (EPIPEN 2-PAK) 0.3 mg/0.3 mL IJ SOAJ injection Inject 0.3 mLs (0.3 mg total) into the muscle as needed for anaphylaxis. 1 each 0  . Fluticasone-Salmeterol (ADVAIR DISKUS) 250-50 MCG/DOSE AEPB inhale 1 dose by mouth twice a day (Patient taking differently: Inhale 1 puff into the lungs daily. ) 60 each 5  . rosuvastatin (CRESTOR) 10 MG tablet TAKE 1 TABLET BY MOUTH DAILY (Patient taking differently: Take 10 mg by mouth daily. ) 90 tablet 3  . triamterene-hydrochlorothiazide (MAXZIDE-25) 37.5-25 MG tablet Take 0.5 tablets by mouth daily. (Patient taking differently: Take 0.5 tablets by mouth daily as needed (High blood pressure). ) 45 tablet 3   No current facility-administered medications for this visit.    ROS:   General:  No weight loss, Fever, chills  HEENT: No recent headaches, no nasal bleeding, no visual changes, no sore throat  Neurologic: No dizziness, blackouts, seizures. No recent symptoms of stroke or mini- stroke. No recent episodes of slurred speech, or temporary blindness.  Cardiac: No recent episodes of chest pain/pressure, no shortness of breath at rest.  No shortness of breath with exertion.  Denies history of atrial fibrillation or irregular heartbeat  Vascular: No history of rest pain in feet.  No history of claudication.  No history of non-healing ulcer, No history of DVT   Pulmonary: No home oxygen, no productive cough, no hemoptysis,  No asthma or wheezing  Musculoskeletal:  [ ]  Arthritis, [ ]  Low back pain,  [ ]  Joint pain  Hematologic:No history of hypercoagulable state.  No history of easy bleeding.  No history of  anemia  Gastrointestinal: No hematochezia or melena,  No gastroesophageal reflux, no trouble swallowing  Urinary: [ ]  chronic Kidney disease, [ ]  on HD - [ ]  MWF or [ ]  TTHS, [ ]  Burning with urination, [ ]  Frequent urination, [ ]  Difficulty urinating;   Skin: No rashes  Psychological: No history of anxiety,  No history of depression   Physical Examination  Vitals:   03/15/20 0836  BP: (!) 158/89  Pulse: (!) 56  Resp: 20  Temp: 97.6 F (36.4 C)  SpO2: 95%  Weight: 206  lb (93.4 kg)  Height: 5\' 11"  (1.803 m)    Body mass index is 28.73 kg/m.  General:  Alert and oriented, no acute distress HEENT: Normal Neck: No JVD Cardiac: Regular Rate and Rhythm Abdomen: Soft, non-tender, non-distended, no mass Skin: No rash Extremity Pulses:  2+ radial, brachial, absent right 1+ left femoral, absent dorsalis pedis, posterior tibial pulses bilaterally Musculoskeletal: No deformity or edema  Neurologic: Upper and lower extremity motor 5/5 and symmetric  DATA:  I reviewed the patient's recent arteriogram which shows diffuse calcific stenosis of the right common femoral artery and of the distal left common femoral profunda SFA origins.  He then has mid SFA occlusions which are heavily calcified bilaterally.  The below-knee popliteal artery is patent bilaterally with what appears to be three-vessel runoff.  ASSESSMENT: Bilateral lower extremity claudication with common femoral superficial femoral artery occlusive disease.  I discussed with the patient today the possibility of staged bilateral femoral endarterectomies to improve his claudication symptoms.  I did discuss also that he has a bilateral SFA occlusion and that the femoral endarterectomy would not make them perfect but would certainly increase his walking distance and may make it perfusion that is adequate enough for him to continue his golf game as he currently wishes.  If his symptoms were not completely relieved we could consider  returning back to the OR at some point for a femoral-popliteal bypass or potentially stenting.  However, I think stenting is less likely due to the heavy calcification of the lesion.  Risk benefits possible complications and procedure details were discussed the patient today including not limited to bleeding infection myocardial events.  I also discussed with him that he would be in the hospital for a couple of days and probably would need some extra help taking care of his wife for a few weeks until he is completely recovered from his operation.  Plan: 1.  Patient will need cardiac stress test prior to his operation to make sure that his cardiac risk is acceptable.  2.  Right common femoral endarterectomy followed by staged left common femoral artery endarterectomy to recovery from the first operation.  The patient will call in the future after he has made arrangements to help take care of his wife during his recovery.  Ruta Hinds, MD [facility] [place] Office: [phone]

## 2020-03-15 NOTE — Telephone Encounter (Signed)
Lyn with [facility] states she is requesting an order for patient to have a stress test in the next 1-2 weeks, prior to procedure. Please call.    [facility] Pre-operative Risk Assessment    Request for surgical clearance:  1. What type of surgery is being performed? Femoral Endarterectomy   2. When is this surgery scheduled? TBD  3. What type of clearance is required (medical clearance vs. Pharmacy clearance to hold med vs. Both)? Medical Clearance  4. Are there any medications that need to be held prior to surgery and how long? Lyn states she does not know   5. Practice name and name of physician performing surgery? [facility]   Dr. Oneida Alar  6. What is your office phone number? [phone]   7.   What is your office fax number?  [phone]  8.   Anesthesia type (None, local, MAC, general) ? Lyn states she is not aware   Zara Council 03/15/2020, 2:00 PM  _________________________________________________________________   (provider comments below)

## 2020-03-15 NOTE — Progress Notes (Signed)
Referring Physician: Dr Gwenlyn Found  Patient name: Bryan Armstrong MRN: [id number] DOB: 1934-11-03 Sex: male  REASON FOR CONSULT: Lifestyle limiting claudication right greater than left  HPI: Bryan Armstrong is a 85 y.o. male, who has been followed in our practice since 2018 for bilateral lower extremity claudication.  He believes his right leg may be slightly worse than the left.  He had bilateral ABIs performed in 2018 which were 0.7 on the right and left.  These were repeated on May 12 of this year and were 0.8 on the right 0.6 on the left.  Patient states that despite the fact his ABIs have remained relatively stable his walking distance has become shorter and shorter over time.  He is barely able to enjoy his golf game at this point because he is very limited in his walking distance.  He recently underwent an arteriogram by Dr. 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  He does not have rest pain.  He does not have tissue loss.  He does stay fairly busy helping to care for his wife who is almost bedridden at this point.  I recently did a peripheral intervention on her for a limb salvage and nonhealing wounds.  She is scheduled to see [place] in the office later today.  Other medical problems include COPD hyperlipidemia hypertension all of which have been stable.  He is a former smoker but quit 2018.  He is on a statin and aspirin.  Past Medical History:  Diagnosis Date  . BRADYCARDIA 06/01/2007  . Cataract    bil cataracts removed  . COPD (chronic obstructive pulmonary disease) ([place])   . EMPHYSEMA 03/28/2008  . Emphysema of lung ([place])   . HYPERGLYCEMIA 06/01/2007  . HYPERLIPIDEMIA 06/01/2007  . Hypertension   . HYPERTENSION 01/19/2009  . Peripheral arterial disease [facility])    Past Surgical History:  Procedure Laterality Date  . ABDOMINAL AORTOGRAM W/LOWER EXTREMITY N/A 03/05/2020   Procedure: ABDOMINAL AORTOGRAM W/  LOWER EXTREMITY;   Surgeon: Lorretta Harp, MD;  Location: [place] [facility];  Service: Cardiovascular;  Laterality: N/A;  . COLONOSCOPY    . POLYPECTOMY    . TONSILLECTOMY AND ADENOIDECTOMY      Family History  Problem Relation Age of Onset  . Cancer Mother        "Male" cancer  . Uterine cancer Mother   . Heart disease Father   . Colon cancer Neg Hx   . Esophageal cancer Neg Hx   . Rectal cancer Neg Hx   . Stomach cancer Neg Hx   . Liver cancer Neg Hx   . Pancreatic cancer Neg Hx   . Prostate cancer Neg Hx     SOCIAL HISTORY: Social History   Socioeconomic History  . Marital status: Married    Spouse name: Not on file  . Number of children: Not on file  . Years of education: Not on file  . Highest education level: Not on file  Occupational History  . Occupation: Retired   Tobacco Use  . Smoking status: Former Smoker    Packs/day: 0.50    Years: 40.00    Pack years: 20.00    Types: Cigarettes    Quit date: 09/26/2017    Years since quitting: 2.4  . Smokeless tobacco: Never Used  . Tobacco comment: stopped 2018  Substance and Sexual Activity  . Alcohol use: No  . Drug use: No  .  Sexual activity: Not on file  Other Topics Concern  . Not on file  Social History Narrative  . Not on file   Social Determinants of Health   Financial Resource Strain:   . Difficulty of Paying Living Expenses:   Food Insecurity:   . Worried About Charity fundraiser in the Last Year:   . Arboriculturist in the Last Year:   Transportation Needs:   . Film/video editor (Medical):   Marland Kitchen Lack of Transportation (Non-Medical):   Physical Activity:   . Days of Exercise per Week:   . Minutes of Exercise per Session:   Stress:   . Feeling of Stress :   Social Connections:   . Frequency of Communication with Friends and Family:   . Frequency of Social Gatherings with Friends and Family:   . Attends Religious Services:   . Active Member of Clubs or Organizations:   . Attends Theatre manager Meetings:   Marland Kitchen Marital Status:   Intimate Partner Violence:   . Fear of Current or Ex-Partner:   . Emotionally Abused:   Marland Kitchen Physically Abused:   . Sexually Abused:     No Known Allergies  Current Outpatient Medications  Medication Sig Dispense Refill  . aspirin 81 MG tablet 1/2 tablet by mouth daily    . EPINEPHrine (EPIPEN 2-PAK) 0.3 mg/0.3 mL IJ SOAJ injection Inject 0.3 mLs (0.3 mg total) into the muscle as needed for anaphylaxis. 1 each 0  . Fluticasone-Salmeterol (ADVAIR DISKUS) 250-50 MCG/DOSE AEPB inhale 1 dose by mouth twice a day (Patient taking differently: Inhale 1 puff into the lungs daily. ) 60 each 5  . rosuvastatin (CRESTOR) 10 MG tablet TAKE 1 TABLET BY MOUTH DAILY (Patient taking differently: Take 10 mg by mouth daily. ) 90 tablet 3  . triamterene-hydrochlorothiazide (MAXZIDE-25) 37.5-25 MG tablet Take 0.5 tablets by mouth daily. (Patient taking differently: Take 0.5 tablets by mouth daily as needed (High blood pressure). ) 45 tablet 3   No current facility-administered medications for this visit.    ROS:   General:  No weight loss, Fever, chills  HEENT: No recent headaches, no nasal bleeding, no visual changes, no sore throat  Neurologic: No dizziness, blackouts, seizures. No recent symptoms of stroke or mini- stroke. No recent episodes of slurred speech, or temporary blindness.  Cardiac: No recent episodes of chest pain/pressure, no shortness of breath at rest.  No shortness of breath with exertion.  Denies history of atrial fibrillation or irregular heartbeat  Vascular: No history of rest pain in feet.  No history of claudication.  No history of non-healing ulcer, No history of DVT   Pulmonary: No home oxygen, no productive cough, no hemoptysis,  No asthma or wheezing  Musculoskeletal:  [ ]  Arthritis, [ ]  Low back pain,  [ ]  Joint pain  Hematologic:No history of hypercoagulable state.  No history of easy bleeding.  No history of  anemia  Gastrointestinal: No hematochezia or melena,  No gastroesophageal reflux, no trouble swallowing  Urinary: [ ]  chronic Kidney disease, [ ]  on HD - [ ]  MWF or [ ]  TTHS, [ ]  Burning with urination, [ ]  Frequent urination, [ ]  Difficulty urinating;   Skin: No rashes  Psychological: No history of anxiety,  No history of depression   Physical Examination  Vitals:   03/15/20 0836  BP: (!) 158/89  Pulse: (!) 56  Resp: 20  Temp: 97.6 F (36.4 C)  SpO2: 95%  Weight: 206  lb (93.4 kg)  Height: 5\' 11"  (1.803 m)    Body mass index is 28.73 kg/m.  General:  Alert and oriented, no acute distress HEENT: Normal Neck: No JVD Cardiac: Regular Rate and Rhythm Abdomen: Soft, non-tender, non-distended, no mass Skin: No rash Extremity Pulses:  2+ radial, brachial, absent right 1+ left femoral, absent dorsalis pedis, posterior tibial pulses bilaterally Musculoskeletal: No deformity or edema  Neurologic: Upper and lower extremity motor 5/5 and symmetric  DATA:  I reviewed the patient's recent arteriogram which shows diffuse calcific stenosis of the right common femoral artery and of the distal left common femoral profunda SFA origins.  He then has mid SFA occlusions which are heavily calcified bilaterally.  The below-knee popliteal artery is patent bilaterally with what appears to be three-vessel runoff.  ASSESSMENT: Bilateral lower extremity claudication with common femoral superficial femoral artery occlusive disease.  I discussed with the patient today the possibility of staged bilateral femoral endarterectomies to improve his claudication symptoms.  I did discuss also that he has a bilateral SFA occlusion and that the femoral endarterectomy would not make them perfect but would certainly increase his walking distance and may make it perfusion that is adequate enough for him to continue his golf game as he currently wishes.  If his symptoms were not completely relieved we could consider  returning back to the OR at some point for a femoral-popliteal bypass or potentially stenting.  However, I think stenting is less likely due to the heavy calcification of the lesion.  Risk benefits possible complications and procedure details were discussed the patient today including not limited to bleeding infection myocardial events.  I also discussed with him that he would be in the hospital for a couple of days and probably would need some extra help taking care of his wife for a few weeks until he is completely recovered from his operation.  Plan: 1.  Patient will need cardiac stress test prior to his operation to make sure that his cardiac risk is acceptable.  2.  Right common femoral endarterectomy followed by staged left common femoral artery endarterectomy to recovery from the first operation.  The patient will call in the future after he has made arrangements to help take care of his wife during his recovery.  Ruta Hinds, MD [facility] [place] Office: [phone]

## 2020-03-16 NOTE — Telephone Encounter (Signed)
Dr. Gwenlyn Found to review, per vascular request: Bryan Armstrong with [facility] states she is requesting an order for patient to have a stress test in the next 1-2 weeks  Would you be ok with this?

## 2020-03-18 NOTE — Telephone Encounter (Signed)
Yes, that would be fine and appropriate  JJB

## 2020-03-19 ENCOUNTER — Other Ambulatory Visit: Payer: Self-pay | Admitting: Student

## 2020-03-19 DIAGNOSIS — Z01818 Encounter for other preprocedural examination: Secondary | ICD-10-CM

## 2020-03-19 NOTE — Progress Notes (Signed)
Will order outpatient Lexiscan Myoview for pre-op evaluation at the request of Vascular Surgeon. OK per Dr. Gwenlyn Found. Please see pre-op clearance phone note from today for more info.

## 2020-03-19 NOTE — Telephone Encounter (Signed)
Pre-op covering staff, Vascular Surgery feels like patient needs a stress test prior to proceed and Dr. Gwenlyn Found agrees that this would be appropriate. Can you contact patient and help schedule this?  Thank you!

## 2020-03-19 NOTE — Telephone Encounter (Signed)
Update on last message: This can be a [employer] and preferably should be scheduled within the next 1-2 weeks.  Thank you!

## 2020-03-19 NOTE — Telephone Encounter (Signed)
Orders in for [place] per Sande Rives. I called patient and left him a detailed message with recommendations for lexiscan myoview. Advised patient in message that scheduling will be calling to set up [place].

## 2020-03-20 ENCOUNTER — Telehealth (HOSPITAL_COMMUNITY): Payer: Self-pay | Admitting: *Deleted

## 2020-03-20 NOTE — Telephone Encounter (Signed)
Left message on voicemail in reference to upcoming appointment scheduled for 03/20/20 Phone number given for a call back so details instructions can be given.  Bryan Armstrong

## 2020-03-20 NOTE — Telephone Encounter (Signed)
Patient returning call.

## 2020-03-21 ENCOUNTER — Ambulatory Visit (HOSPITAL_COMMUNITY): Payer: Medicare Other | Attending: Cardiovascular Disease

## 2020-03-21 ENCOUNTER — Other Ambulatory Visit: Payer: Self-pay

## 2020-03-21 ENCOUNTER — Ambulatory Visit: Payer: Medicare Other | Admitting: Cardiovascular Disease

## 2020-03-21 DIAGNOSIS — Z01818 Encounter for other preprocedural examination: Secondary | ICD-10-CM | POA: Insufficient documentation

## 2020-03-21 DIAGNOSIS — I10 Essential (primary) hypertension: Secondary | ICD-10-CM | POA: Diagnosis not present

## 2020-03-21 DIAGNOSIS — I4510 Unspecified right bundle-branch block: Secondary | ICD-10-CM | POA: Diagnosis not present

## 2020-03-21 DIAGNOSIS — R001 Bradycardia, unspecified: Secondary | ICD-10-CM | POA: Insufficient documentation

## 2020-03-21 DIAGNOSIS — Z01810 Encounter for preprocedural cardiovascular examination: Secondary | ICD-10-CM | POA: Diagnosis not present

## 2020-03-21 DIAGNOSIS — E1151 Type 2 diabetes mellitus with diabetic peripheral angiopathy without gangrene: Secondary | ICD-10-CM | POA: Insufficient documentation

## 2020-03-21 LAB — MYOCARDIAL PERFUSION IMAGING
LV dias vol: 92 mL (ref 62–150)
LV sys vol: 37 mL
Peak HR: 80 {beats}/min
Rest HR: 53 {beats}/min
SDS: 0
SRS: 0
SSS: 0
TID: 0.9

## 2020-03-21 MED ORDER — REGADENOSON 0.4 MG/5ML IV SOLN
0.4000 mg | Freq: Once | INTRAVENOUS | Status: AC
  Administered 2020-03-21: 0.4 mg via INTRAVENOUS

## 2020-03-21 MED ORDER — TECHNETIUM TC 99M TETROFOSMIN IV KIT
11.0000 | PACK | Freq: Once | INTRAVENOUS | Status: AC | PRN
  Administered 2020-03-21: 11 via INTRAVENOUS
  Filled 2020-03-21: qty 11

## 2020-03-21 MED ORDER — TECHNETIUM TC 99M TETROFOSMIN IV KIT
30.1000 | PACK | Freq: Once | INTRAVENOUS | Status: AC | PRN
  Administered 2020-03-21: 30.1 via INTRAVENOUS
  Filled 2020-03-21: qty 31

## 2020-03-21 NOTE — Telephone Encounter (Signed)
Myoview came back low risk with no evidence of ischemia and LVEF of 55-65%. Will route back to pre-op pool so that pre-op APP can complete risk assessment tomorrow.   Darreld Mclean, PA-C 03/21/2020 11:07 PM

## 2020-03-22 ENCOUNTER — Other Ambulatory Visit: Payer: Self-pay | Admitting: Family Medicine

## 2020-03-22 ENCOUNTER — Other Ambulatory Visit: Payer: Self-pay

## 2020-03-22 NOTE — Telephone Encounter (Addendum)
   Primary Cardiologist: Quay Burow, MD  Chart reviewed as part of pre-operative protocol coverage. Given past medical history and time since last visit, based on ACC/AHA guidelines, Bryan Armstrong would be at acceptable risk for the planned procedure without further cardiovascular testing.   His RCRI is a class I risk, 0.4% risk of major cardiac event.  I will route this recommendation to the requesting party via Epic fax function and remove from pre-op pool.  Please call with questions.  Bryan Ng. [name] NP-C    03/22/2020, 7:38 AM [address] Office [phone] Fax [phone]

## 2020-03-26 NOTE — Pre-Procedure Instructions (Signed)
[id number] [place] [address] Phone: [phone] Fax: [place], [address] AT [address] [place] [address] Phone: [phone] Fax: [phone]    Your procedure is scheduled on Tues., April 03, 2020 from 8:01AM-11:35AM  Report to [facility] Entrance "A" at 6:00AM  Call this number if you have problems the morning of surgery:  [phone]   Remember:  Do not eat or drink after midnight on June 21st    Take these medicines the morning of surgery with A SIP OF WATER: Rosuvastatin (CRESTOR)  If Needed: Epipen Fluticasone-Salmeterol (ADVAIR DISKUS)- bring with you the day of surgery  As of today, STOP taking all Aspirin (unless instructed by your doctor) and Other Aspirin containing products, Vitamins, Fish oils, and Herbal medications. Also stop all NSAIDS i.e. Advil, Ibuprofen, Motrin, Aleve, Anaprox, Naproxen, BC, Goody Powders, and all Supplements.  No Smoking of any kind, Tobacco, or Alcohol products 24 hours prior to your procedure. If you use a Cpap at night, you may bring all equipment for your overnight stay.   Special instructions: [facility]- Preparing For Surgery  Before surgery, you can play an important role. Because skin is not sterile, your skin needs to be as free of germs as possible. You can reduce the number of germs on your skin by washing with CHG (chlorahexidine gluconate) Soap before surgery.  CHG is an antiseptic cleaner which kills germs and bonds with the skin to continue killing germs even after washing.    Please do not use if you have an allergy to CHG or antibacterial soaps. If your skin becomes reddened/irritated stop using the CHG.  Do not shave (including legs and underarms) for at least 48 hours prior to first CHG shower. It is OK to shave  your face.  Please follow these instructions carefully.   1. Shower the NIGHT BEFORE SURGERY and the MORNING OF SURGERY with CHG.   2. If you chose to wash your hair, wash your hair first as usual with your normal shampoo.  3. After you shampoo, rinse your hair and body thoroughly to remove the shampoo.  4. Use CHG as you would any other liquid soap. You can apply CHG directly to the skin and wash gently with a scrungie or a clean washcloth.   5. Apply the CHG Soap to your body ONLY FROM THE NECK DOWN.  Do not use on open wounds or open sores. Avoid contact with your eyes, ears, mouth and genitals (private parts). Wash Face and genitals (private parts)  with your normal soap.  6. Wash thoroughly, paying special attention to the area where your surgery will be performed.  7. Thoroughly rinse your body with warm water from the neck down.  8. DO NOT shower/wash with your normal soap after using and rinsing off the CHG Soap.  9. Pat yourself dry with a CLEAN TOWEL.  10. Wear CLEAN PAJAMAS to bed the night before surgery, wear comfortable clothes the morning of surgery  11. Place CLEAN SHEETS on your bed the night of your first shower and DO NOT SLEEP WITH PETS.  Day of Surgery:             Remember to brush your teeth WITH YOUR REGULAR TOOTHPASTE.   Do not wear jewelry.  Do not wear lotions, powders, colognes, or deodorant.  Do  not shave 48 hours prior to surgery.  Men may shave face and neck.  Do not bring valuables to the hospital.  [facility] is not responsible for any belongings or valuables.  Contacts, dentures or bridgework may not be worn into surgery.    For patients admitted to the hospital, discharge time will be determined by your treatment team.  Patients discharged the day of surgery will not be allowed to drive home, and someone age 83 and over needs to stay with them for 24 hours.  Please wear clean clothes to the hospital/surgery center.    Please read over the  following fact sheets that you were given.

## 2020-03-27 ENCOUNTER — Other Ambulatory Visit: Payer: Self-pay

## 2020-03-27 ENCOUNTER — Encounter (HOSPITAL_COMMUNITY): Payer: Self-pay

## 2020-03-27 ENCOUNTER — Encounter (HOSPITAL_COMMUNITY)
Admission: RE | Admit: 2020-03-27 | Discharge: 2020-03-27 | Disposition: A | Discharge status: Home / Self Care | Payer: Medicare Other | Source: Ambulatory Visit | Attending: Vascular Surgery | Admitting: Vascular Surgery

## 2020-03-27 DIAGNOSIS — Z01812 Encounter for preprocedural laboratory examination: Secondary | ICD-10-CM | POA: Diagnosis not present

## 2020-03-27 HISTORY — DX: Gastro-esophageal reflux disease without esophagitis: K21.9

## 2020-03-27 LAB — PROTIME-INR
INR: 1.1 (ref 0.8–1.2)
Prothrombin Time: 13.4 seconds (ref 11.4–15.2)

## 2020-03-27 LAB — TYPE AND SCREEN
ABO/RH(D): O NEG
Antibody Screen: NEGATIVE

## 2020-03-27 LAB — COMPREHENSIVE METABOLIC PANEL
ALT: 21 U/L (ref 0–44)
AST: 20 U/L (ref 15–41)
Albumin: 3.8 g/dL (ref 3.5–5.0)
Alkaline Phosphatase: 53 U/L (ref 38–126)
Anion gap: 6 (ref 5–15)
BUN: 19 mg/dL (ref 8–23)
CO2: 28 mmol/L (ref 22–32)
Calcium: 8.8 mg/dL — ABNORMAL LOW (ref 8.9–10.3)
Chloride: 104 mmol/L (ref 98–111)
Creatinine, Ser: 1.07 mg/dL (ref 0.61–1.24)
GFR calc Af Amer: >60 mL/min (ref >60)
GFR calc non Af Amer: >60 mL/min (ref >60)
Glucose, Bld: 124 mg/dL — ABNORMAL HIGH (ref 70–99)
Potassium: 4.4 mmol/L (ref 3.5–5.1)
Sodium: 138 mmol/L (ref 135–145)
Total Bilirubin: 0.7 mg/dL (ref 0.3–1.2)
Total Protein: 6.8 g/dL (ref 6.5–8.1)

## 2020-03-27 LAB — URINALYSIS, ROUTINE W REFLEX MICROSCOPIC
Bilirubin Urine: NEGATIVE
Glucose, UA: NEGATIVE mg/dL
Hgb urine dipstick: NEGATIVE
Ketones, ur: NEGATIVE mg/dL
Leukocytes,Ua: NEGATIVE
Nitrite: NEGATIVE
Protein, ur: NEGATIVE mg/dL
Specific Gravity, Urine: 1.02 (ref 1.005–1.030)
pH: 5 (ref 5.0–8.0)

## 2020-03-27 LAB — CBC
HCT: 43.4 % (ref 39.0–52.0)
Hemoglobin: 13.8 g/dL (ref 13.0–17.0)
MCH: 29.5 pg (ref 26.0–34.0)
MCHC: 31.8 g/dL (ref 30.0–36.0)
MCV: 92.7 fL (ref 80.0–100.0)
Platelets: 181 10*3/uL (ref 150–400)
RBC: 4.68 MIL/uL (ref 4.22–5.81)
RDW: 13.7 % (ref 11.5–15.5)
WBC: 7.7 10*3/uL (ref 4.0–10.5)
nRBC: 0 % (ref 0.0–0.2)

## 2020-03-27 LAB — SURGICAL PCR SCREEN
MRSA, PCR: NEGATIVE
Staphylococcus aureus: NEGATIVE

## 2020-03-27 LAB — APTT: aPTT: 31 seconds (ref 24–36)

## 2020-03-27 LAB — ABO/RH: ABO/RH(D): O NEG

## 2020-03-27 NOTE — Progress Notes (Signed)
PCP: Dr. Dimas Chyle Cardiologist: Dr. Gwenlyn Found  EKG: 02/07/20 CXR: 11/11/19 ECHO: 08/10/2011 Stress Test:04/02/2020 Cardiac Cath: denies   Patient denies shortness of breath, fever, cough, and chest pain at PAT appointment.  Patient verbalized understanding of instructions provided today at the PAT appointment.  Patient asked to review instructions at home and day of surgery.

## 2020-03-29 ENCOUNTER — Encounter (HOSPITAL_COMMUNITY): Payer: Medicare Other

## 2020-03-29 ENCOUNTER — Ambulatory Visit: Payer: Medicare Other

## 2020-03-30 ENCOUNTER — Other Ambulatory Visit (HOSPITAL_COMMUNITY)
Admission: RE | Admit: 2020-03-30 | Discharge: 2020-03-30 | Disposition: A | Discharge status: Home / Self Care | Payer: Medicare Other | Source: Ambulatory Visit | Attending: Vascular Surgery | Admitting: Vascular Surgery

## 2020-03-30 ENCOUNTER — Telehealth: Payer: Self-pay

## 2020-03-30 DIAGNOSIS — Z20822 Contact with and (suspected) exposure to covid-19: Secondary | ICD-10-CM | POA: Insufficient documentation

## 2020-03-30 DIAGNOSIS — Z01812 Encounter for preprocedural laboratory examination: Secondary | ICD-10-CM | POA: Diagnosis not present

## 2020-03-30 NOTE — Telephone Encounter (Signed)
Telephone call from pt calling to verify upcoming surgery information. Pt advised surgery on 04/03/20- arrive at [facility] hospital at 47AM, NPO after MN. Pt voiced understanding.

## 2020-03-31 LAB — SARS CORONAVIRUS 2 (TAT 6-24 HRS): SARS Coronavirus 2: NEGATIVE

## 2020-04-03 ENCOUNTER — Inpatient Hospital Stay (HOSPITAL_COMMUNITY)
Admission: RE | Admit: 2020-04-03 | Discharge: 2020-04-07 | DRG: 254 | Disposition: A | Discharge status: Home / Self Care | Payer: Medicare Other | Attending: Vascular Surgery | Admitting: Vascular Surgery

## 2020-04-03 ENCOUNTER — Other Ambulatory Visit: Payer: Self-pay

## 2020-04-03 ENCOUNTER — Inpatient Hospital Stay (HOSPITAL_COMMUNITY): Payer: Medicare Other | Admitting: Anesthesiology

## 2020-04-03 ENCOUNTER — Encounter (HOSPITAL_COMMUNITY)
Admission: RE | Disposition: A | Discharge status: Home / Self Care | Payer: Self-pay | Source: Home / Self Care | Attending: Vascular Surgery

## 2020-04-03 ENCOUNTER — Encounter (HOSPITAL_COMMUNITY): Payer: Self-pay | Admitting: Vascular Surgery

## 2020-04-03 DIAGNOSIS — I739 Peripheral vascular disease, unspecified: Secondary | ICD-10-CM | POA: Diagnosis not present

## 2020-04-03 DIAGNOSIS — L509 Urticaria, unspecified: Secondary | ICD-10-CM | POA: Diagnosis not present

## 2020-04-03 DIAGNOSIS — Z7982 Long term (current) use of aspirin: Secondary | ICD-10-CM | POA: Diagnosis not present

## 2020-04-03 DIAGNOSIS — Z79899 Other long term (current) drug therapy: Secondary | ICD-10-CM | POA: Diagnosis not present

## 2020-04-03 DIAGNOSIS — J449 Chronic obstructive pulmonary disease, unspecified: Secondary | ICD-10-CM | POA: Diagnosis not present

## 2020-04-03 DIAGNOSIS — Z87891 Personal history of nicotine dependence: Secondary | ICD-10-CM

## 2020-04-03 DIAGNOSIS — I10 Essential (primary) hypertension: Secondary | ICD-10-CM | POA: Diagnosis not present

## 2020-04-03 DIAGNOSIS — Z8249 Family history of ischemic heart disease and other diseases of the circulatory system: Secondary | ICD-10-CM | POA: Diagnosis not present

## 2020-04-03 DIAGNOSIS — I70211 Atherosclerosis of native arteries of extremities with intermittent claudication, right leg: Secondary | ICD-10-CM | POA: Diagnosis not present

## 2020-04-03 DIAGNOSIS — E785 Hyperlipidemia, unspecified: Secondary | ICD-10-CM | POA: Diagnosis not present

## 2020-04-03 DIAGNOSIS — K219 Gastro-esophageal reflux disease without esophagitis: Secondary | ICD-10-CM | POA: Diagnosis not present

## 2020-04-03 DIAGNOSIS — Z7951 Long term (current) use of inhaled steroids: Secondary | ICD-10-CM

## 2020-04-03 HISTORY — PX: ENDARTERECTOMY FEMORAL: SHX5804

## 2020-04-03 SURGERY — ENDARTERECTOMY, FEMORAL
Anesthesia: General | Site: Groin | Laterality: Right

## 2020-04-03 MED ORDER — SUGAMMADEX SODIUM 200 MG/2ML IV SOLN
INTRAVENOUS | Status: DC | PRN
  Administered 2020-04-03: 200 mg via INTRAVENOUS

## 2020-04-03 MED ORDER — DEXAMETHASONE SODIUM PHOSPHATE 10 MG/ML IJ SOLN
INTRAMUSCULAR | Status: AC
  Filled 2020-04-03: qty 1

## 2020-04-03 MED ORDER — CHLORHEXIDINE GLUCONATE 4 % EX LIQD: 60.0000 mL | Freq: Once | CUTANEOUS | Status: DC

## 2020-04-03 MED ORDER — OXYCODONE HCL 5 MG PO TABS
5.0000 mg | ORAL_TABLET | Freq: Once | ORAL | Status: AC | PRN
  Administered 2020-04-03: 5 mg via ORAL

## 2020-04-03 MED ORDER — PHENOL 1.4 % MT LIQD: 1.0000 | OROMUCOSAL | Status: DC | PRN

## 2020-04-03 MED ORDER — METOPROLOL TARTRATE 5 MG/5ML IV SOLN: 2.0000 mg | INTRAVENOUS | Status: DC | PRN

## 2020-04-03 MED ORDER — ALBUMIN HUMAN 5 % IV SOLN
INTRAVENOUS | Status: DC | PRN
Start: 2020-04-03 — End: 2020-04-03

## 2020-04-03 MED ORDER — FENTANYL CITRATE (PF) 100 MCG/2ML IJ SOLN
INTRAMUSCULAR | Status: DC | PRN
  Administered 2020-04-03: 25 ug via INTRAVENOUS
  Administered 2020-04-03: 100 ug via INTRAVENOUS
  Administered 2020-04-03: 25 ug via INTRAVENOUS
  Administered 2020-04-03: 50 ug via INTRAVENOUS

## 2020-04-03 MED ORDER — ACETAMINOPHEN 650 MG RE SUPP
325.0000 mg | RECTAL | Status: DC | PRN
  Administered 2020-04-07: 650 mg via RECTAL

## 2020-04-03 MED ORDER — SODIUM CHLORIDE 0.9 % IV SOLN: 500.0000 mL | Freq: Once | INTRAVENOUS | Status: DC | PRN

## 2020-04-03 MED ORDER — SODIUM CHLORIDE 0.9 % IV SOLN
INTRAVENOUS | Status: AC
  Filled 2020-04-03: qty 1.2

## 2020-04-03 MED ORDER — PHENYLEPHRINE HCL-NACL 10-0.9 MG/250ML-% IV SOLN
INTRAVENOUS | Status: DC | PRN
  Administered 2020-04-03: 50 ug/min via INTRAVENOUS

## 2020-04-03 MED ORDER — MOMETASONE FURO-FORMOTEROL FUM 200-5 MCG/ACT IN AERO
2.0000 | INHALATION_SPRAY | Freq: Two times a day (BID) | RESPIRATORY_TRACT | Status: DC
  Administered 2020-04-04 – 2020-04-07 (×5): 2 via RESPIRATORY_TRACT
  Filled 2020-04-03: qty 8.8

## 2020-04-03 MED ORDER — HYDRALAZINE HCL 20 MG/ML IJ SOLN: 5.0000 mg | INTRAMUSCULAR | Status: DC | PRN

## 2020-04-03 MED ORDER — GUAIFENESIN-DM 100-10 MG/5ML PO SYRP: 15.0000 mL | ORAL_SOLUTION | ORAL | Status: DC | PRN

## 2020-04-03 MED ORDER — EPHEDRINE SULFATE 50 MG/ML IJ SOLN
INTRAMUSCULAR | Status: DC | PRN
Start: 2020-04-03 — End: 2020-04-03
  Administered 2020-04-03 (×2): 5 mg via INTRAVENOUS
  Administered 2020-04-03: 10 mg via INTRAVENOUS

## 2020-04-03 MED ORDER — SODIUM CHLORIDE 0.9 % IV SOLN: INTRAVENOUS | Status: DC

## 2020-04-03 MED ORDER — EPHEDRINE 5 MG/ML INJ
INTRAVENOUS | Status: AC
  Filled 2020-04-03: qty 10

## 2020-04-03 MED ORDER — POTASSIUM CHLORIDE CRYS ER 20 MEQ PO TBCR: 20.0000 meq | EXTENDED_RELEASE_TABLET | Freq: Every day | ORAL | Status: DC | PRN

## 2020-04-03 MED ORDER — CEFAZOLIN SODIUM-DEXTROSE 2-4 GM/100ML-% IV SOLN
2.0000 g | INTRAVENOUS | Status: AC
  Administered 2020-04-03: 2 g via INTRAVENOUS
  Filled 2020-04-03: qty 100

## 2020-04-03 MED ORDER — DEXAMETHASONE SODIUM PHOSPHATE 10 MG/ML IJ SOLN
INTRAMUSCULAR | Status: DC | PRN
  Administered 2020-04-03: 10 mg via INTRAVENOUS

## 2020-04-03 MED ORDER — HEPARIN SODIUM (PORCINE) 1000 UNIT/ML IJ SOLN
INTRAMUSCULAR | Status: AC
  Filled 2020-04-03: qty 1

## 2020-04-03 MED ORDER — ORAL CARE MOUTH RINSE: 15.0000 mL | Freq: Once | OROMUCOSAL | Status: AC

## 2020-04-03 MED ORDER — LABETALOL HCL 5 MG/ML IV SOLN
10.0000 mg | INTRAVENOUS | Status: DC | PRN
  Filled 2020-04-03: qty 4

## 2020-04-03 MED ORDER — DEXTROSE-NACL 5-0.45 % IV SOLN: INTRAVENOUS | Status: DC

## 2020-04-03 MED ORDER — CEFAZOLIN SODIUM-DEXTROSE 2-4 GM/100ML-% IV SOLN
2.0000 g | Freq: Three times a day (TID) | INTRAVENOUS | Status: AC
  Administered 2020-04-03 (×2): 2 g via INTRAVENOUS
  Filled 2020-04-03 (×2): qty 100

## 2020-04-03 MED ORDER — OXYCODONE HCL 5 MG/5ML PO SOLN: 5.0000 mg | Freq: Once | ORAL | Status: AC | PRN

## 2020-04-03 MED ORDER — HEPARIN SODIUM (PORCINE) 1000 UNIT/ML IJ SOLN
INTRAMUSCULAR | Status: DC | PRN
Start: 2020-04-03 — End: 2020-04-03
  Administered 2020-04-03: 7000 [IU] via INTRAVENOUS
  Administered 2020-04-03: 10000 [IU] via INTRAVENOUS

## 2020-04-03 MED ORDER — PROTAMINE SULFATE 10 MG/ML IV SOLN
INTRAVENOUS | Status: AC
  Filled 2020-04-03: qty 5

## 2020-04-03 MED ORDER — ALUM & MAG HYDROXIDE-SIMETH 200-200-20 MG/5ML PO SUSP: 15.0000 mL | ORAL | Status: DC | PRN

## 2020-04-03 MED ORDER — TRIAMTERENE-HCTZ 37.5-25 MG PO TABS
0.5000 | ORAL_TABLET | Freq: Every day | ORAL | Status: DC
  Administered 2020-04-04 – 2020-04-07 (×4): 0.5 via ORAL
  Filled 2020-04-03 (×4): qty 1

## 2020-04-03 MED ORDER — PROPOFOL 10 MG/ML IV BOLUS
INTRAVENOUS | Status: AC
  Filled 2020-04-03: qty 20

## 2020-04-03 MED ORDER — FENTANYL CITRATE (PF) 250 MCG/5ML IJ SOLN
INTRAMUSCULAR | Status: AC
  Filled 2020-04-03: qty 5

## 2020-04-03 MED ORDER — ONDANSETRON HCL 4 MG/2ML IJ SOLN: 4.0000 mg | Freq: Once | INTRAMUSCULAR | Status: DC | PRN

## 2020-04-03 MED ORDER — ACETAMINOPHEN 325 MG PO TABS
325.0000 mg | ORAL_TABLET | ORAL | Status: DC | PRN
  Administered 2020-04-03 – 2020-04-05 (×3): 650 mg via ORAL
  Filled 2020-04-03 (×4): qty 2

## 2020-04-03 MED ORDER — DIPHENHYDRAMINE HCL 50 MG/ML IJ SOLN
INTRAMUSCULAR | Status: DC | PRN
Start: 2020-04-03 — End: 2020-04-03
  Administered 2020-04-03: 25 mg via INTRAVENOUS

## 2020-04-03 MED ORDER — LIDOCAINE 2% (20 MG/ML) 5 ML SYRINGE
INTRAMUSCULAR | Status: AC
  Filled 2020-04-03: qty 5

## 2020-04-03 MED ORDER — HEPARIN SODIUM (PORCINE) 5000 UNIT/ML IJ SOLN
5000.0000 [IU] | Freq: Three times a day (TID) | INTRAMUSCULAR | Status: DC
  Administered 2020-04-04 – 2020-04-07 (×10): 5000 [IU] via SUBCUTANEOUS
  Filled 2020-04-03 (×9): qty 1

## 2020-04-03 MED ORDER — ONDANSETRON HCL 4 MG/2ML IJ SOLN
INTRAMUSCULAR | Status: AC
  Filled 2020-04-03: qty 2

## 2020-04-03 MED ORDER — OXYCODONE HCL 5 MG PO TABS: 5.0000 mg | ORAL_TABLET | ORAL | Status: DC | PRN

## 2020-04-03 MED ORDER — ROSUVASTATIN CALCIUM 5 MG PO TABS
10.0000 mg | ORAL_TABLET | Freq: Every day | ORAL | Status: DC
  Administered 2020-04-03: 10 mg via ORAL
  Filled 2020-04-03: qty 2

## 2020-04-03 MED ORDER — HEMOSTATIC AGENTS (NO CHARGE) OPTIME
TOPICAL | Status: DC | PRN
Start: 2020-04-03 — End: 2020-04-03
  Administered 2020-04-03: 1 via TOPICAL

## 2020-04-03 MED ORDER — ROCURONIUM BROMIDE 10 MG/ML (PF) SYRINGE
PREFILLED_SYRINGE | INTRAVENOUS | Status: AC
  Filled 2020-04-03: qty 10

## 2020-04-03 MED ORDER — ASPIRIN EC 81 MG PO TBEC
81.0000 mg | DELAYED_RELEASE_TABLET | Freq: Every day | ORAL | Status: DC
  Administered 2020-04-04 – 2020-04-07 (×4): 81 mg via ORAL
  Filled 2020-04-03 (×6): qty 1

## 2020-04-03 MED ORDER — OXYCODONE HCL 5 MG PO TABS
ORAL_TABLET | ORAL | Status: AC
  Filled 2020-04-03: qty 1

## 2020-04-03 MED ORDER — FENTANYL CITRATE (PF) 100 MCG/2ML IJ SOLN
25.0000 ug | INTRAMUSCULAR | Status: DC | PRN
  Administered 2020-04-03 (×2): 25 ug via INTRAVENOUS

## 2020-04-03 MED ORDER — LIDOCAINE 2% (20 MG/ML) 5 ML SYRINGE
INTRAMUSCULAR | Status: DC | PRN
  Administered 2020-04-03: 50 mg via INTRAVENOUS

## 2020-04-03 MED ORDER — DOCUSATE SODIUM 100 MG PO CAPS
100.0000 mg | ORAL_CAPSULE | Freq: Every day | ORAL | Status: DC
  Administered 2020-04-04 – 2020-04-07 (×4): 100 mg via ORAL
  Filled 2020-04-03 (×4): qty 1

## 2020-04-03 MED ORDER — FENTANYL CITRATE (PF) 100 MCG/2ML IJ SOLN
INTRAMUSCULAR | Status: AC
  Filled 2020-04-03: qty 2

## 2020-04-03 MED ORDER — ONDANSETRON HCL 4 MG/2ML IJ SOLN: 4.0000 mg | Freq: Four times a day (QID) | INTRAMUSCULAR | Status: DC | PRN

## 2020-04-03 MED ORDER — ONDANSETRON HCL 4 MG/2ML IJ SOLN
INTRAMUSCULAR | Status: DC | PRN
  Administered 2020-04-03 (×2): 4 mg via INTRAVENOUS

## 2020-04-03 MED ORDER — MAGNESIUM SULFATE 2 GM/50ML IV SOLN: 2.0000 g | Freq: Every day | INTRAVENOUS | Status: DC | PRN

## 2020-04-03 MED ORDER — CHLORHEXIDINE GLUCONATE 0.12 % MT SOLN
15.0000 mL | Freq: Once | OROMUCOSAL | Status: AC
  Administered 2020-04-03: 15 mL via OROMUCOSAL
  Filled 2020-04-03: qty 15

## 2020-04-03 MED ORDER — ROCURONIUM BROMIDE 10 MG/ML (PF) SYRINGE
PREFILLED_SYRINGE | INTRAVENOUS | Status: DC | PRN
  Administered 2020-04-03: 10 mg via INTRAVENOUS
  Administered 2020-04-03: 60 mg via INTRAVENOUS
  Administered 2020-04-03: 10 mg via INTRAVENOUS

## 2020-04-03 MED ORDER — SODIUM CHLORIDE 0.9 % IV SOLN
INTRAVENOUS | Status: DC | PRN
  Administered 2020-04-03: 500 mL

## 2020-04-03 MED ORDER — PROTAMINE SULFATE 10 MG/ML IV SOLN
INTRAVENOUS | Status: DC | PRN
Start: 2020-04-03 — End: 2020-04-03
  Administered 2020-04-03: 5 mg via INTRAVENOUS
  Administered 2020-04-03: 20 mg via INTRAVENOUS
  Administered 2020-04-03: 25 mg via INTRAVENOUS
  Administered 2020-04-03: 20 mg via INTRAVENOUS

## 2020-04-03 MED ORDER — PANTOPRAZOLE SODIUM 40 MG PO TBEC
40.0000 mg | DELAYED_RELEASE_TABLET | Freq: Every day | ORAL | Status: DC
  Administered 2020-04-04 – 2020-04-07 (×4): 40 mg via ORAL
  Filled 2020-04-03 (×4): qty 1

## 2020-04-03 MED ORDER — 0.9 % SODIUM CHLORIDE (POUR BTL) OPTIME
TOPICAL | Status: DC | PRN
  Administered 2020-04-03: 1000 mL

## 2020-04-03 MED ORDER — PROPOFOL 10 MG/ML IV BOLUS
INTRAVENOUS | Status: DC | PRN
  Administered 2020-04-03: 120 mg via INTRAVENOUS

## 2020-04-03 MED ORDER — MORPHINE SULFATE (PF) 2 MG/ML IV SOLN: 2.0000 mg | INTRAVENOUS | Status: DC | PRN

## 2020-04-03 MED ORDER — LACTATED RINGERS IV SOLN: INTRAVENOUS | Status: DC | PRN

## 2020-04-03 SURGICAL SUPPLY — 35 items
ADH SKN CLS APL DERMABOND .7 (GAUZE/BANDAGES/DRESSINGS) ×1
AGENT HMST SPONGE THK3/8 (HEMOSTASIS)
CANISTER SUCT 3000ML PPV (MISCELLANEOUS) ×2 IMPLANT
CANNULA VESSEL 3MM 2 BLNT TIP (CANNULA) ×3 IMPLANT
CLIP VESOCCLUDE MED 24/CT (CLIP) ×2 IMPLANT
CLIP VESOCCLUDE SM WIDE 24/CT (CLIP) ×2 IMPLANT
COVER WAND RF STERILE (DRAPES) ×1 IMPLANT
DERMABOND ADVANCED (GAUZE/BANDAGES/DRESSINGS) ×1
DERMABOND ADVANCED .7 DNX12 (GAUZE/BANDAGES/DRESSINGS) ×1 IMPLANT
DRAIN HEMOVAC 1/8 X 5 (WOUND CARE) ×1 IMPLANT
ELECT REM PT RETURN 9FT ADLT (ELECTROSURGICAL) ×2
ELECTRODE REM PT RTRN 9FT ADLT (ELECTROSURGICAL) ×1 IMPLANT
EVACUATOR SILICONE 100CC (DRAIN) ×1 IMPLANT
GLOVE BIO SURGEON STRL SZ7.5 (GLOVE) ×2 IMPLANT
GOWN STRL REUS W/ TWL LRG LVL3 (GOWN DISPOSABLE) ×3 IMPLANT
GOWN STRL REUS W/TWL LRG LVL3 (GOWN DISPOSABLE) ×6
HEMOSTAT SPONGE AVITENE ULTRA (HEMOSTASIS) IMPLANT
KIT BASIN OR (CUSTOM PROCEDURE TRAY) ×2 IMPLANT
KIT TURNOVER KIT B (KITS) ×2 IMPLANT
NS IRRIG 1000ML POUR BTL (IV SOLUTION) ×4 IMPLANT
PACK PERIPHERAL VASCULAR (CUSTOM PROCEDURE TRAY) ×2 IMPLANT
PAD ARMBOARD 7.5X6 YLW CONV (MISCELLANEOUS) ×4 IMPLANT
PATCH HEMASHIELD 8X150 (Vascular Products) ×1 IMPLANT
POWDER SURGICEL 3.0 GRAM (HEMOSTASIS) ×1 IMPLANT
SUT ETHILON 3 0 PS 1 (SUTURE) ×1 IMPLANT
SUT PROLENE 5 0 C 1 24 (SUTURE) ×2 IMPLANT
SUT PROLENE 6 0 CC (SUTURE) ×3 IMPLANT
SUT VIC AB 2-0 SH 27 (SUTURE) ×4
SUT VIC AB 2-0 SH 27XBRD (SUTURE) ×1 IMPLANT
SUT VIC AB 3-0 SH 27 (SUTURE) ×4
SUT VIC AB 3-0 SH 27X BRD (SUTURE) ×1 IMPLANT
TOWEL GREEN STERILE (TOWEL DISPOSABLE) ×2 IMPLANT
TRAY FOLEY MTR SLVR 16FR STAT (SET/KITS/TRAYS/PACK) ×1 IMPLANT
UNDERPAD 30X36 HEAVY ABSORB (UNDERPADS AND DIAPERS) ×2 IMPLANT
WATER STERILE IRR 1000ML POUR (IV SOLUTION) ×2 IMPLANT

## 2020-04-03 NOTE — Progress Notes (Signed)
Registered Guests : [place]

## 2020-04-03 NOTE — Anesthesia Preprocedure Evaluation (Addendum)
Anesthesia Evaluation  Patient identified by MRN, date of birth, ID band Patient awake    Reviewed: Allergy & Precautions, H&P , NPO status   Airway Mallampati: II  TM Distance: >3 FB Neck ROM: full    Dental  (+) Teeth Intact, Dental Advidsory Given   Pulmonary COPD,  COPD inhaler, former smoker,    breath sounds clear to auscultation       Cardiovascular hypertension, On Medications + Peripheral Vascular Disease   Rhythm:regular Rate:Normal     Neuro/Psych    GI/Hepatic GERD  Medicated and Controlled,  Endo/Other    Renal/GU      Musculoskeletal   Abdominal   Peds  Hematology   Anesthesia Other Findings Right front tooth with preexisting crack. Broken tooth in back   Reproductive/Obstetrics                            Anesthesia Physical Anesthesia Plan  ASA: II  Anesthesia Plan:    Post-op Pain Management:    Induction:   PONV Risk Score and Plan:   Airway Management Planned:   Additional Equipment:   Intra-op Plan:   Post-operative Plan:   Informed Consent:     Dental Advisory Given  Plan Discussed with: Anesthesiologist, CRNA and Surgeon  Anesthesia Plan Comments:         Anesthesia Quick Evaluation

## 2020-04-03 NOTE — Progress Notes (Signed)
  Day of Surgery Note    Subjective:  C/o some stinging in the right groin; RN reports pt had hives upon arrival in recovery, but with treatment, much improved   Vitals:   04/03/20 1215 04/03/20 1235  BP:  (!) 147/78  Pulse: (!) 56 (!) 55  Resp: 16   Temp: 98.6 F (37 C) 97.9 F (36.6 C)  SpO2: 95% 97%    Incisions:   Right groin soft and clean Extremities:  + doppler signal right DP/PT Cardiac:  regular Lungs:  Non labored    Assessment/Plan:  This is a 85 y.o. male who is s/p  Right femoral endarterectomy  -doing well post op - + doppler signals right DP/PT -hives reported once pt in pacu much improved -drain with minimal output    Leontine Locket, PA-C 04/03/2020 1:56 PM [phone]

## 2020-04-03 NOTE — Discharge Instructions (Signed)
[facility]  Discharge instructions  Lower Extremity Vascular Surgery  Please refer to the following instruction for your post-procedure care. Your surgeon or physician assistant will discuss any changes with you.  Activity  You are encouraged to walk as much as you can. You can slowly return to normal activities during the month after your surgery. Avoid strenuous activity and heavy lifting until your doctor tells you it's OK. Avoid activities such as vacuuming or swinging a golf club. Do not drive until your doctor give the OK and you are no longer taking prescription pain medications. It is also normal to have difficulty with sleep habits, eating and bowel movement after surgery. These will go away with time.  Bathing/Showering  Shower daily after you go home. Do not soak in a bathtub, hot tub, or swim until the incision heals completely.  Incision Care  Clean your incision with mild soap and water. Shower every day. Pat the area dry with a clean towel. You do not need a bandage unless otherwise instructed. Do not apply any ointments or creams to your incision. If you have open wounds you will be instructed how to care for them or a visiting nurse may be arranged for you. If you have staples or sutures along your incision they will be removed at your post-op appointment. You may have skin glue on your incision. Do not peel it off. It will come off on its own in about one week.  Wash the groin wound with soap and water daily and pat dry. (No tub bath-only shower)  Then put a dry gauze or washcloth in the groin to keep this area dry to help prevent wound infection.  Do this daily and as needed.  Do not use Vaseline or neosporin on your incisions.  Only use soap and water on your incisions and then protect and keep dry.  Diet  Resume your normal diet. There are no special food restrictions following this procedure. A low fat/ low cholesterol diet is  recommended for all patients with vascular disease. In order to heal from your surgery, it is CRITICAL to get adequate nutrition. Your body requires vitamins, minerals, and protein. Vegetables are the best source of vitamins and minerals. Vegetables also provide the perfect balance of protein. Processed food has little nutritional value, so try to avoid this.  Medications  Resume taking all your medications unless your doctor or physician assistant tells you not to. If your incision is causing pain, you may take over-the-counter pain relievers such as acetaminophen (Tylenol). If you were prescribed a stronger pain medication, please aware these medication can cause nausea and constipation. Prevent nausea by taking the medication with a snack or meal. Avoid constipation by drinking plenty of fluids and eating foods with high amount of fiber, such as fruits, vegetables, and grains. Take Colace 100 mg (an over-the-counter stool softener) twice a day as needed for constipation.  Do not take Tylenol if you are taking prescription pain medications.  Follow Up  Our office will schedule a follow up appointment 2-3 weeks following discharge.  Please call us immediately for any of the following conditions  .Severe or worsening pain in your legs or feet while at rest or while walking .Increase pain, redness, warmth, or drainage (pus) from your incision site(s) . Fever of 101 degree or higher . The swelling in your leg with the bypass suddenly worsens and becomes more painful than when you were in the hospital .  If you have been instructed to feel your graft pulse then you should do so every day. If you can no longer feel this pulse, call the office immediately. Not all patients are given this instruction. .  Leg swelling is common after leg bypass surgery.  The swelling should improve over a few months following surgery. To improve the swelling, you may elevate your legs above the level of your heart while  you are sitting or resting. Your surgeon or physician assistant may ask you to apply an ACE wrap or wear compression (TED) stockings to help to reduce swelling.  Reduce your risk of vascular disease  Stop smoking. If you would like help call [facility] at 1-800-QUIT-NOW 570-542-2068) or [place] at 418 062 5838.  . Manage your cholesterol . Maintain a desired weight . Control your diabetes weight . Control your diabetes . Keep your blood pressure down .  If you have any questions, please call the office at [phone]

## 2020-04-03 NOTE — Anesthesia Procedure Notes (Signed)
Procedure Name: Intubation Date/Time: 04/03/2020 7:39 AM Performed by: Neldon Newport, CRNA Pre-anesthesia Checklist: Timeout performed, Patient being monitored, Suction available, Emergency Drugs available and Patient identified Patient Re-evaluated:Patient Re-evaluated prior to induction Oxygen Delivery Method: Circle system utilized Preoxygenation: Pre-oxygenation with 100% oxygen Induction Type: IV induction Ventilation: Mask ventilation without difficulty and Oral airway inserted - appropriate to patient size Laryngoscope Size: Mac and 4 Grade View: Grade II Tube type: Oral Tube size: 7.5 mm Number of attempts: 1 Placement Confirmation: ETT inserted through vocal cords under direct vision,  positive ETCO2 and breath sounds checked- equal and bilateral Secured at: 22 cm Tube secured with: Tape Dental Injury: Teeth and Oropharynx as per pre-operative assessment  Comments: Immobile epiglottis.

## 2020-04-03 NOTE — Op Note (Signed)
Procedure: Right femoral endarterectomy  Preoperative diagnosis: Claudication right leg  Postoperative diagnosis: Same  Anesthesia: General  Assistant: Guadalupe Guerra Cellar, RNFA  Operative findings: #1 severe calcific plaque  2 Dacron patch right common femoral artery extending onto the proximal 2 cm of the superficial femoral artery  Operative details: After pain informed consent, the patient was taken the operating room. The patient was placed in supine position operating table. After induction general anesthesia and endotracheal ovation patient's right lower extremities prepped and draped in usual sterile fashion. Next a longitudinal incision is made in the right groin carried on through the subcutaneous tissues down the level of the right common femoral artery. There were a large number of abundant lymphatics and these were controlled with cautery. Common femoral artery was severely calcified. It was dissected free circumferentially and a vessel loop placed around it. Dissection was carried up underneath the inguinal ligament in order to find a soft spot on the artery in the distal external iliac artery. A vessel loop was placed around the vessel here. There were several large side branches in the common femoral artery and Vesseloops were placed around these as well. Superficial femoral and profunda femoris arteries were dissected free circumferentially. To reach a soft area on the profunda I had to go down to the first branch point. Vesseloops were placed around these branches. This point the patient was given 10,000 units of intravenous heparin. He was given an additional 7000 units of heparin during the course of the case. After appropriate circulation time, longitudinal opening was made in the anterior surface of the common femoral artery and extended proximally with Potts scissors up to the distal external iliac artery where a more normal-appearing artery was obtained. The arteriotomy was then  extended distally into the proximal 2 cm of the superficial femoral artery to also get past the large portion of the plaque. Endarterectomy was then begun in a suitable plane and a large amount of calcified exophytic plaque was removed. The plaque extended into the origin of the profunda and I was able to get a good clean endpoint about 1 cm into the profunda under direct vision with eversion technique. A nice endpoint was also obtained in the superficial femoral artery. All loose debris was removed from the artery. A dacryon patch was then brought up on the operative field and sewn on as a patch angioplasty using a running 6-0 Prolene suture. Prior to completion of the anastomosis it was for blood backbled and thoroughly flushed. Estimates was secured clamps were released and there was good flow into the profunda and superficial femoral arteries immediately. There was biphasic to nearly triphasic posterior tibial Doppler flow and monitor to close to biphasic Doppler flow in the dorsalis pedis artery in the foot. Hemostasis was obtained with direct pressure and Gelfoam powder as well as 100 mg of protamine. The wound was thoroughly irrigated with normal saline solution. The groin was then closed in multiple layers over a 10 flat JP which had been brought out through separate stab incision and sewn to the groin. Running 203 0 and 4-0 Vicryl suture layers were placed. Dermabond was applied to the skin. Patient tolerated procedure well and there were no complications. Instrument sponge and needle counts correct in the case. Patient was taken the recovery in stable condition.  Ruta Hinds, MD [facility] [place] Office: [phone]

## 2020-04-03 NOTE — Progress Notes (Signed)
Patient arrived from PACU.  A&O, vital signs taken and cycling initiated.  Denies pain. CHG completed.

## 2020-04-03 NOTE — Anesthesia Postprocedure Evaluation (Signed)
Anesthesia Post Note  Patient: Bryan Armstrong  Procedure(s) Performed: Right Femoral Endarterectomy (Right Groin)     Patient location during evaluation: PACU Anesthesia Type: General Level of consciousness: awake and alert Pain management: pain level controlled Vital Signs Assessment: post-procedure vital signs reviewed and stable Respiratory status: spontaneous breathing, nonlabored ventilation, respiratory function stable and patient connected to nasal cannula oxygen Cardiovascular status: blood pressure returned to baseline and stable Postop Assessment: no apparent nausea or vomiting Anesthetic complications: no Comments: Following surgery, patient noted to have  maculopapular rash involving the lower trunk and mid thoracic area at the nipple line. After 45 minutes in PACU, rash noted to have receded significantly.    No complications documented.  Last Vitals:  Vitals:   04/03/20 1400 04/03/20 1543  BP: 135/84   Pulse: (!) 51   Resp: 14   Temp:  36.4 C  SpO2: 98%     Last Pain:  Vitals:   04/03/20 1543  TempSrc: Oral  PainSc:                  Carney Saxton COKER

## 2020-04-03 NOTE — Interval H&P Note (Signed)
History and Physical Interval Note:  04/03/2020 7:23 AM  Bryan Armstrong  has presented today for surgery, with the diagnosis of PERIPHERAL ARTERY DISEASE.  The various methods of treatment have been discussed with the patient and family. After consideration of risks, benefits and other options for treatment, the patient has consented to  Procedure(s): ENDARTERECTOMY FEMORAL (Right) as a surgical intervention.  The patient's history has been reviewed, patient examined, no change in status, stable for surgery.  I have reviewed the patient's chart and labs.  Questions were answered to the patient's satisfaction.     Ruta Hinds

## 2020-04-03 NOTE — Transfer of Care (Signed)
Immediate Anesthesia Transfer of Care Note  Patient: Bryan Armstrong  Procedure(s) Performed: Right Femoral Endarterectomy (Right Groin)  Patient Location: PACU  Anesthesia Type:General  Level of Consciousness: awake, alert  and oriented  Airway & Oxygen Therapy: Patient Spontanous Breathing and Patient connected to nasal cannula oxygen  Post-op Assessment: Report given to RN, Post -op Vital signs reviewed and stable and Patient moving all extremities X 4  Post vital signs: Reviewed and stable  Last Vitals:  Vitals Value Taken Time  BP 157/81 04/03/20 1112  Temp    Pulse 62 04/03/20 1114  Resp 18 04/03/20 1114  SpO2 97 % 04/03/20 1114  Vitals shown include unvalidated device data.  Last Pain:  Vitals:   04/03/20 0614  TempSrc:   PainSc: 0-No pain         Complications: No complications documented.

## 2020-04-04 ENCOUNTER — Encounter (HOSPITAL_COMMUNITY): Payer: Self-pay | Admitting: Vascular Surgery

## 2020-04-04 ENCOUNTER — Other Ambulatory Visit: Payer: Self-pay | Admitting: Physician Assistant

## 2020-04-04 ENCOUNTER — Encounter (HOSPITAL_COMMUNITY): Payer: Medicare Other

## 2020-04-04 LAB — LIPID PANEL
Cholesterol: 98 mg/dL (ref 0–200)
HDL: 28 mg/dL — ABNORMAL LOW (ref >40)
LDL Cholesterol: 47 mg/dL (ref 0–99)
Total CHOL/HDL Ratio: 3.5 RATIO
Triglycerides: 115 mg/dL (ref <150)
VLDL: 23 mg/dL (ref 0–40)

## 2020-04-04 LAB — BASIC METABOLIC PANEL
Anion gap: 11 (ref 5–15)
BUN: 18 mg/dL (ref 8–23)
CO2: 22 mmol/L (ref 22–32)
Calcium: 8.3 mg/dL — ABNORMAL LOW (ref 8.9–10.3)
Chloride: 103 mmol/L (ref 98–111)
Creatinine, Ser: 1.06 mg/dL (ref 0.61–1.24)
GFR calc Af Amer: >60 mL/min (ref >60)
GFR calc non Af Amer: >60 mL/min (ref >60)
Glucose, Bld: 139 mg/dL — ABNORMAL HIGH (ref 70–99)
Potassium: 3.9 mmol/L (ref 3.5–5.1)
Sodium: 136 mmol/L (ref 135–145)

## 2020-04-04 LAB — CBC
HCT: 37.3 % — ABNORMAL LOW (ref 39.0–52.0)
Hemoglobin: 12.2 g/dL — ABNORMAL LOW (ref 13.0–17.0)
MCH: 29.8 pg (ref 26.0–34.0)
MCHC: 32.7 g/dL (ref 30.0–36.0)
MCV: 91.2 fL (ref 80.0–100.0)
Platelets: 146 10*3/uL — ABNORMAL LOW (ref 150–400)
RBC: 4.09 MIL/uL — ABNORMAL LOW (ref 4.22–5.81)
RDW: 13.3 % (ref 11.5–15.5)
WBC: 11.4 10*3/uL — ABNORMAL HIGH (ref 4.0–10.5)
nRBC: 0 % (ref 0.0–0.2)

## 2020-04-04 MED ORDER — CHLORHEXIDINE GLUCONATE CLOTH 2 % EX PADS
6.0000 | MEDICATED_PAD | Freq: Every day | CUTANEOUS | Status: DC
  Administered 2020-04-07: 6 via TOPICAL

## 2020-04-04 MED ORDER — ROSUVASTATIN CALCIUM 20 MG PO TABS
20.0000 mg | ORAL_TABLET | Freq: Every day | ORAL | Status: DC
  Administered 2020-04-04 – 2020-04-06 (×3): 20 mg via ORAL
  Filled 2020-04-04 (×3): qty 1

## 2020-04-04 MED ORDER — ROSUVASTATIN CALCIUM 20 MG PO TABS: 20.0000 mg | ORAL_TABLET | Freq: Every day | ORAL | 1 refills | Status: DC

## 2020-04-04 MED ORDER — OXYCODONE-ACETAMINOPHEN 5-325 MG PO TABS: 1.0000 | ORAL_TABLET | Freq: Four times a day (QID) | ORAL | 0 refills | Status: DC | PRN

## 2020-04-04 NOTE — Progress Notes (Signed)
Assessed Pt's surgical incision on right groin presented with firm touched, suspected small size of hematoma developing after Pt ambulated in his room. No active bleeding noted.  We marked on the size of hematoma, small drainage on dry gauze dressing. JP drain total recorded  at 55 ml since post op. Pt denied pain. He's hemodynamically stable.  We will postpone Heparin SQ schedule this 6 am until MD verified in morning round. We will continue to monitor.  Kennyth Lose, RN

## 2020-04-04 NOTE — Evaluation (Signed)
Occupational Therapy Evaluation Patient Details Name: Bryan Armstrong MRN: [id number] DOB: 01-09-1935 Today's Date: 04/04/2020    History of Present Illness 85 y/o male s/p Right femoral endarterectomy. PMH includes COPD, HTN, PAD   Clinical Impression   PTA pt living with spouse, independent for BADL/IADL. Pt is currently completing mobility independently without DME. He is able to complete LB BADLs without device or external assist. Reviewed general safety when d/c home. Pt has wife in SNF that is d/c home next week- educated pt on not assisting with heavy transfers until fully healed and cleared by doctor. Daughters present in room and supportive, are available to help. No further OT needs identified, OT will sign off. Thank you for this consult.     Follow Up Recommendations  No OT follow up    Equipment Recommendations  None recommended by OT    Recommendations for Other Services       Precautions / Restrictions Precautions Precautions: None Restrictions Weight Bearing Restrictions: No      Mobility Bed Mobility Overal bed mobility: Modified Independent                Transfers Overall transfer level: Modified independent                    Balance Overall balance assessment: No apparent balance deficits (not formally assessed)                                         ADL either performed or assessed with clinical judgement   ADL Overall ADL's : Modified independent                                       General ADL Comments: Pt demonstrates ability to complete BADL at mod I level. Reviewed safe post edarectomy precuations (no heavy lifting, cautious in the shower). Pt has wife he helps care for whom is d/cing from SNF- educated pt not assist in heavy transfers until healed. Pt is able to complete functional mobility indpendently without device and LB ADLs without difficulty.     Vision Baseline Vision/History: No  visual deficits Patient Visual Report: No change from baseline       Perception     Praxis      Pertinent Vitals/Pain Pain Assessment: Faces Faces Pain Scale: Hurts a little bit Pain Location: discomfort at femoral site Pain Descriptors / Indicators: Discomfort Pain Intervention(s): Monitored during session     Hand Dominance     Extremity/Trunk Assessment Upper Extremity Assessment Upper Extremity Assessment: Overall WFL for tasks assessed   Lower Extremity Assessment Lower Extremity Assessment: Overall WFL for tasks assessed       Communication Communication Communication: No difficulties   Cognition Arousal/Alertness: Awake/alert Behavior During Therapy: WFL for tasks assessed/performed Overall Cognitive Status: Within Functional Limits for tasks assessed                                     General Comments  JP drain still in place    Exercises     Shoulder Instructions      Home Living Family/patient expects to be discharged to:: Private residence Living Arrangements: Spouse/significant other;Children Available Help at Discharge: Family;Available 24  hours/day Type of Home: House Home Access: Stairs to enter [employer] of Steps: 3   Home Layout: One level     Bathroom Shower/Tub: Occupational psychologist: Handicapped height Bathroom Accessibility: Yes How Accessible: Accessible via walker Home Equipment: Shower seat          Prior Functioning/Environment Level of Independence: Independent        Comments: helps care for wife whom has MS. Wife is currently in SNF but coming home a week after pt is d/c        OT Problem List: Decreased knowledge of use of DME or AE;Decreased knowledge of precautions;Decreased activity tolerance      OT Treatment/Interventions:      OT Goals(Current goals can be found in the care plan section) Acute Rehab OT Goals Patient Stated Goal: return home with wife OT Goal  Formulation: All assessment and education complete, DC therapy  OT Frequency:     Barriers to D/C:            Co-evaluation              AM-PAC OT "6 Clicks" Daily Activity     Outcome Measure Help from another person eating meals?: None Help from another person taking care of personal grooming?: None Help from another person toileting, which includes using toliet, bedpan, or urinal?: None Help from another person bathing (including washing, rinsing, drying)?: None Help from another person to put on and taking off regular upper body clothing?: None Help from another person to put on and taking off regular lower body clothing?: None 6 Click Score: 24   End of Session Nurse Communication: Mobility status  Activity Tolerance: Patient tolerated treatment well Patient left: in bed;with call bell/phone within reach;with family/visitor present  OT Visit Diagnosis: Other abnormalities of gait and mobility (R26.89)                Time: 6578-4696 OT Time Calculation (min): 25 min Charges:  OT General Charges $OT Visit: 1 Visit OT Evaluation $OT Eval Low Complexity: 1 Low OT Treatments $Self Care/Home Management : 8-22 mins  Zenovia Jarred, MSOT, OTR/L [facility] Office Number: [phone] Pager: [phone]  Zenovia Jarred 04/04/2020, 11:50 AM

## 2020-04-04 NOTE — Progress Notes (Addendum)
  Progress Note    04/04/2020 7:36 AM 1 Day Post-Op  Subjective:  Minimal R groin incision pain.  R foot feels much better compared to before surgery   Vitals:   04/04/20 0500 04/04/20 0550  BP:    Pulse:    Resp: 14 16  Temp:    SpO2:     Physical Exam: Lungs:  Non labored Incisions:  R groin incision soft, c/d/i Extremities:  Palpable R DP Abdomen: soft Neurologic: A&O  CBC    Component Value Date/Time   WBC 11.4 (H) 04/04/2020 0013   RBC 4.09 (L) 04/04/2020 0013   HGB 12.2 (L) 04/04/2020 0013   HGB 14.2 02/28/2020 1107   HCT 37.3 (L) 04/04/2020 0013   HCT 42.6 02/28/2020 1107   PLT 146 (L) 04/04/2020 0013   PLT 201 02/28/2020 1107   MCV 91.2 04/04/2020 0013   MCV 89 02/28/2020 1107   MCH 29.8 04/04/2020 0013   MCHC 32.7 04/04/2020 0013   RDW 13.3 04/04/2020 0013   RDW 12.9 02/28/2020 1107   LYMPHSABS 1.7 12/15/2017 1458   MONOABS 0.6 12/15/2017 1458   EOSABS 0.4 12/15/2017 1458   BASOSABS 0.1 12/15/2017 1458    BMET    Component Value Date/Time   NA 136 04/04/2020 0013   NA 139 02/28/2020 1107   K 3.9 04/04/2020 0013   CL 103 04/04/2020 0013   CO2 22 04/04/2020 0013   GLUCOSE 139 (H) 04/04/2020 0013   BUN 18 04/04/2020 0013   BUN 19 02/28/2020 1107   CREATININE 1.06 04/04/2020 0013   CALCIUM 8.3 (L) 04/04/2020 0013   GFRNONAA >60 04/04/2020 0013   GFRAA >60 04/04/2020 0013    INR    Component Value Date/Time   INR 1.1 03/27/2020 1333     Intake/Output Summary (Last 24 hours) at 04/04/2020 0736 Last data filed at 04/04/2020 0500 Gross per 24 hour  Intake 3951.5 ml  Output 3130.5 ml  Net 821 ml     Assessment/Plan:  85 y.o. male is s/p R CFA endarterectomy 1 Day Post-Op   R foot well perfused with palpable DP pulse Ok for discharge if ambulating without difficulty and JP drain less than 50 cc at 3 pm today Follow up in 2-3 weeks   Dagoberto Ligas, PA-C [facility] [phone] 04/04/2020 7:36 AM  Foot pink  warm no hematoma Incision clean Pt feels better  Agree with above.  Pt had multiple lymphatics at time of dissection.  Drain 50 cc overnight serous.  Will watch drain over the day today.  If less than 50 cc by 3pm today will d/c and send home otherwise may need to stay until drain output decreases to decrease risk of seroma.  Ruta Hinds, MD [facility] [place] Office: [phone]

## 2020-04-04 NOTE — Progress Notes (Signed)
PT Cancellation Note  Patient Details Name: Bryan Armstrong MRN: [id number] DOB: 12-May-1935   Cancelled Treatment:    Reason Eval/Treat Not Completed: PT screened, no needs identified, will sign off. Pt evaluated by OT with no acute PT needs identified at this time. PT signing off.    Clearnce Sorrel Lakendrick Paradis 04/04/2020, 9:13 AM

## 2020-04-04 NOTE — Progress Notes (Signed)
JP drain output 28 cc.  Total this shift 7am-1pm = 53 cc

## 2020-04-04 NOTE — Progress Notes (Signed)
JP drain output 7a-1130a = 25 cc  s/p foley d/c - voided

## 2020-04-04 NOTE — Progress Notes (Signed)
Dr. Donzetta Matters was notified. No new order received and MD agreed to administer Heparin sq at  6 am. Continue to monitor.  Kennyth Lose, RN

## 2020-04-04 NOTE — Progress Notes (Signed)
Mobility Specialist - Progress Note   04/04/20 1546  Mobility  Activity Ambulated in hall  Level of Assistance Independent  Assistive Device None  Distance Ambulated (ft) 250 ft  Mobility Response Tolerated well  Mobility performed by Mobility specialist  $Mobility charge 1 Mobility    Pre-mobility: 59 HR, 123/73 BP, 94% SpO2 Post-mobility: 65 HR, 161/86 then 181/73 BP, 93% SpO2  Nurse notified of elevated BP, pt endorsed he felt anxious after mobility.   Pricilla Handler Mobility Specialist Mobility Phone: [phone]

## 2020-04-04 NOTE — Progress Notes (Signed)
Plan of care reviewed. Pt's been progressing. Appeared alert and oriented x 4, able to ambulate in his room with one standby assisted with front wheel walker.  He's hemodynamically stable, afebrile, NSR on monitor, BP within normal limits, SPO2 98% on room air.  Right groin dressing dry and clean, no hematoma, level 0. JP drain presented with minimal dark red drainage and some blood clot, no active bleeding. Pain tolerated well. No incidents of immediate distress noted tonight. We will continue to monitor.  Kennyth Lose, RN

## 2020-04-04 NOTE — Progress Notes (Signed)
PHARMACIST LIPID MONITORING   Bryan Armstrong is a 85 y.o. male admitted on 04/03/2020 with claudication of bilateral lower extremities.  Pharmacy has been consulted to optimize lipid-lowering therapy with the indication of secondary prevention for clinical ASCVD.  Recent Labs:  Lipid Panel (last 6 months):   Lab Results  Component Value Date   CHOL 98 04/04/2020   TRIG 115 04/04/2020   HDL 28 (L) 04/04/2020   CHOLHDL 3.5 04/04/2020   VLDL 23 04/04/2020   LDLCALC 47 04/04/2020    Hepatic function panel (last 6 months):   Lab Results  Component Value Date   AST 20 03/27/2020   ALT 21 03/27/2020   ALKPHOS 53 03/27/2020   BILITOT 0.7 03/27/2020    SCr (since admission):   Serum creatinine: 1.06 mg/dL 04/04/20 0013 Estimated creatinine clearance: 61.9 mL/min  Current lipid-lowering therapy: Crestor 10mg  Previous lipid-lowering therapies (if applicable): n/a Documented or reported allergies or intolerances to lipid-lowering therapies (if applicable): n/a  Assessment:  Patient agrees with changes to lipid-lowering therapy  Recommendation per protocol:  Increase intensity or dose of current statin.  Follow-up with:  Cardiology provider - Quay Burow, MD  Follow-up labs after discharge:    Liver function panel and lipid panel in 8-12 weeks then annually  Plan: Increase to Crestor 20mg  PO once daily  Bertis Ruddy, PharmD 04/04/2020, 7:43 AM

## 2020-04-05 ENCOUNTER — Inpatient Hospital Stay (HOSPITAL_COMMUNITY): Payer: Medicare Other

## 2020-04-05 DIAGNOSIS — I739 Peripheral vascular disease, unspecified: Secondary | ICD-10-CM

## 2020-04-05 NOTE — Progress Notes (Signed)
Mobility Specialist - Progress Note   04/05/20 1623  Mobility  Activity Ambulated in hall  Level of Assistance Independent  Assistive Device None  Distance Ambulated (ft) 500 ft  Mobility Response Tolerated well  Mobility performed by Mobility specialist  $Mobility charge 1 Mobility    Pre-mobility: 66 HR, 97% SpO2 Post-mobility: 70 HR, 92% SPO2   Marchetta Navratil Transport planner Phone: [phone]

## 2020-04-05 NOTE — Progress Notes (Signed)
JP drain output 0500-1330-  60 cc serosanguineous.

## 2020-04-05 NOTE — Plan of Care (Signed)
Plan of care reviewed. Pt's been progressing.  Problem: Clinical Measurements: remained afebrile. Goal: Will remain free from infection Outcome: Progressing   Problem: Clinical Measurements: No acute respiratory distress. SPO2 95-98% on room air. Auscultated lungs clear bilaterally. Goal: Respiratory complications will improve Outcome: Progressing   Problem: Clinical Measurements: NSR / SB on monitor, HR 50s-60s, BP within normal limits. Goal: Cardiovascular complication will be avoided Outcome: Progressing   Problem: Activity: ambulated in hall way, tolerated well. Goal: Risk for activity intolerance will decrease Outcome: Progressing   Problem: Pain Managment: denied pain, able to sleep well. Goal: General experience of comfort will improve Outcome: Progressing   Problem: Skin Integrity: right groin level 1, no active bleeding or progressive hematoma. JP drain appeared to be serous drainage. Goal: Risk for impaired skin integrity will decrease Outcome: Progressing   No immediate distress noted tonight. We will continue to monitor.  Kennyth Lose, RN

## 2020-04-05 NOTE — Progress Notes (Addendum)
  Progress Note    04/05/2020 7:37 AM 2 Days Post-Op  Subjective:  No new complaints   Vitals:   04/05/20 0453 04/05/20 0735  BP: 130/77 137/81  Pulse: 63 (!) 53  Resp: 16 14  Temp: 98.5 F (36.9 C) 98.5 F (36.9 C)  SpO2: 94% 95%   Physical Exam: Lungs:  Non labored Incisions:  R groin incision c/d/i Extremities:  Palpable DP pulse; 160cc serosanguinous drainage from JP in last 24 hours Neurologic: A&O  CBC    Component Value Date/Time   WBC 11.4 (H) 04/04/2020 0013   RBC 4.09 (L) 04/04/2020 0013   HGB 12.2 (L) 04/04/2020 0013   HGB 14.2 02/28/2020 1107   HCT 37.3 (L) 04/04/2020 0013   HCT 42.6 02/28/2020 1107   PLT 146 (L) 04/04/2020 0013   PLT 201 02/28/2020 1107   MCV 91.2 04/04/2020 0013   MCV 89 02/28/2020 1107   MCH 29.8 04/04/2020 0013   MCHC 32.7 04/04/2020 0013   RDW 13.3 04/04/2020 0013   RDW 12.9 02/28/2020 1107   LYMPHSABS 1.7 12/15/2017 1458   MONOABS 0.6 12/15/2017 1458   EOSABS 0.4 12/15/2017 1458   BASOSABS 0.1 12/15/2017 1458    BMET    Component Value Date/Time   NA 136 04/04/2020 0013   NA 139 02/28/2020 1107   K 3.9 04/04/2020 0013   CL 103 04/04/2020 0013   CO2 22 04/04/2020 0013   GLUCOSE 139 (H) 04/04/2020 0013   BUN 18 04/04/2020 0013   BUN 19 02/28/2020 1107   CREATININE 1.06 04/04/2020 0013   CALCIUM 8.3 (L) 04/04/2020 0013   GFRNONAA >60 04/04/2020 0013   GFRAA >60 04/04/2020 0013    INR    Component Value Date/Time   INR 1.1 03/27/2020 1333     Intake/Output Summary (Last 24 hours) at 04/05/2020 0737 Last data filed at 04/05/2020 0300 Gross per 24 hour  Intake 250 ml  Output 163 ml  Net 87 ml     Assessment/Plan:  85 y.o. male is s/p R CFA endarterectomy 2 Days Post-Op   R foot well perfused Ok for discharge if JP drain less than 50cc at 3p   Dagoberto Ligas, PA-C [facility] [phone] 04/05/2020 7:37 AM  Agree with above drain output is still too high at this point.  It is  serous in character.  This most likely represents lymphatic drainage.  He will need to stay in the hospital until drain output is less than 50 cc over 24 hours.  I discussed with him that most likely he would not be discharged today.  He is at high risk for accumulation of a seroma if we take this drain out early.  Otherwise he is doing well.  He will continue to ambulate in the hallways.  Ruta Hinds, MD [facility] [place] Office: [phone]

## 2020-04-05 NOTE — Progress Notes (Signed)
JP drain output last 24 hours 163 ml. Appeared serous drainage.  Bryan Armstrong ASUORV,IF.

## 2020-04-05 NOTE — Progress Notes (Signed)
Vascular ABI with TBI study completed.   See Cv Proc for preliminary results.   Bryan Armstrong

## 2020-04-06 MED ORDER — BISACODYL 5 MG PO TBEC
10.0000 mg | DELAYED_RELEASE_TABLET | Freq: Every day | ORAL | Status: DC | PRN
  Administered 2020-04-06 – 2020-04-07 (×2): 10 mg via ORAL
  Filled 2020-04-06 (×2): qty 2

## 2020-04-06 NOTE — Discharge Summary (Signed)
Discharge Summary     Bryan Armstrong 1935-07-09 85 y.o. male  [id number]  Admission Date: 04/03/2020  Discharge Date: 04/07/2020  Physician: Elam Dutch, MD  Admission Diagnosis: PAD (peripheral artery disease) [facility]) [I73.9]  HPI:   This is a 85 y.o. male who has been followed in our practice since 2018 for bilateral lower extremity claudication.  He believes his right leg may be slightly worse than the left.  He had bilateral ABIs performed in 2018 which were 0.7 on the right and left.  These were repeated on May 12 of this year and were 0.8 on the right 0.6 on the left.  Patient states that despite the fact his ABIs have remained relatively stable his walking distance has become shorter and shorter over time.  He is barely able to enjoy his golf game at this point because he is very limited in his walking distance.  He recently underwent an arteriogram by Dr. 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  He does not have rest pain.  He does not have tissue loss.  He does stay fairly busy helping to care for his wife who is almost bedridden at this point.  I recently did a peripheral intervention on her for a limb salvage and nonhealing wounds.  She is scheduled to see [place] in the office later today.  Other medical problems include COPD hyperlipidemia hypertension all of which have been stable.  He is a former smoker but quit 2018.  He is on a statin and aspirin.  Hospital Course:  The patient was admitted to the hospital and taken to the operating room on 04/03/2020 and underwent: Right femoral endarterectomy    Findings: #1 severe calcific plaque  2 Dacron patch right common femoral artery extending onto the proximal 2 cm of the superficial femoral artery  The pt tolerated the procedure well and was transported to the PACU in good condition.   By POD 1, pt doing well with palpable DP pulse right foot.  JP drain with  increased drainage.   POD 2, pt continued to have increased drainage from JP drain.    POD 3, pt doing well.  Ambulating in halls.  Right foot well perfused.  Continued to have increased serous drainage from JP.    POD 4, His JP drain was removed and he was discharged home.   The remainder of the hospital course consisted of increasing mobilization and increasing intake of solids without difficulty.  CBC    Component Value Date/Time   WBC 11.4 (H) 04/04/2020 0013   RBC 4.09 (L) 04/04/2020 0013   HGB 12.2 (L) 04/04/2020 0013   HGB 14.2 02/28/2020 1107   HCT 37.3 (L) 04/04/2020 0013   HCT 42.6 02/28/2020 1107   PLT 146 (L) 04/04/2020 0013   PLT 201 02/28/2020 1107   MCV 91.2 04/04/2020 0013   MCV 89 02/28/2020 1107   MCH 29.8 04/04/2020 0013   MCHC 32.7 04/04/2020 0013   RDW 13.3 04/04/2020 0013   RDW 12.9 02/28/2020 1107   LYMPHSABS 1.7 12/15/2017 1458   MONOABS 0.6 12/15/2017 1458   EOSABS 0.4 12/15/2017 1458   BASOSABS 0.1 12/15/2017 1458    BMET    Component Value Date/Time   NA 136 04/04/2020 0013   NA 139 02/28/2020 1107   K 3.9 04/04/2020 0013   CL 103 04/04/2020 0013   CO2 22 04/04/2020 0013   GLUCOSE 139 (H) 04/04/2020 0013  BUN 18 04/04/2020 0013   BUN 19 02/28/2020 1107   CREATININE 1.06 04/04/2020 0013   CALCIUM 8.3 (L) 04/04/2020 0013   GFRNONAA >60 04/04/2020 0013   GFRAA >60 04/04/2020 0013     Discharge Instructions    Discharge patient   Complete by: As directed    Discharge disposition: 01-Home or Self Care   Discharge patient date: 04/07/2020      Discharge Diagnosis:  PAD (peripheral artery disease) ([place]) [I73.9]  Secondary Diagnosis: Patient Active Problem List   Diagnosis Date Noted  . PAD (peripheral artery disease) ([place]) 04/03/2020  . Neck pain 12/30/2018  . GERD (gastroesophageal reflux disease) 09/28/2018  . Peripheral arterial disease ([place]) 06/15/2018  . Former smoker 06/15/2018  . History of colonic polyps 01/07/2018  .  Nonspecific abnormal finding in stool contents 01/07/2018  . Allergic rhinitis 05/31/2013  . Urticaria 11/02/2011  . HTN (hypertension) 01/19/2009  . COPD, group A, by GOLD 2017 classification ([place]) 03/28/2008  . Dyslipidemia 06/01/2007  . Bradycardia 06/01/2007  . HYPERGLYCEMIA 06/01/2007   Past Medical History:  Diagnosis Date  . BRADYCARDIA 06/01/2007  . Cataract    bil cataracts removed  . COPD (chronic obstructive pulmonary disease) ([place])   . EMPHYSEMA 03/28/2008  . Emphysema of lung ([place])   . GERD (gastroesophageal reflux disease)   . HYPERGLYCEMIA 06/01/2007  . HYPERLIPIDEMIA 06/01/2007  . Hypertension   . HYPERTENSION 01/19/2009  . Peripheral arterial disease ([facility])      Allergies as of 04/07/2020      Reactions   Bee Venom Anaphylaxis   Yellow jackets      Medication List    TAKE these medications   aspirin 81 MG tablet Take 81 mg by mouth daily.   EPINEPHrine 0.3 mg/0.3 mL Soaj injection Commonly known as: EpiPen 2-Pak Inject 0.3 mLs (0.3 mg total) into the muscle as needed for anaphylaxis.   Fluticasone-Salmeterol 250-50 MCG/DOSE Aepb Commonly known as: Advair Diskus inhale 1 dose by mouth twice a day What changed:   how much to take  how to take this  when to take this  additional instructions   oxyCODONE-acetaminophen 5-325 MG tablet Commonly known as: Percocet Take 1 tablet by mouth every 6 (six) hours as needed.   PRESERVISION AREDS 2 PO Take 1 capsule by mouth daily.   rosuvastatin 20 MG tablet Commonly known as: CRESTOR TAKE 1 TABLET(20 MG) BY MOUTH DAILY AT 6 PM What changed:   medication strength  See the new instructions.   triamterene-hydrochlorothiazide 37.5-25 MG tablet Commonly known as: MAXZIDE-25 Take 0.5 tablets by mouth daily.       Discharge Instructions: [facility] Discharge instructions Lower Extremity Bypass Surgery  Please refer to the following instruction for your  post-procedure care. Your surgeon or physician assistant will discuss any changes with you.  Activity  You are encouraged to walk as much as you can. You can slowly return to normal activities during the month after your surgery. Avoid strenuous activity and heavy lifting until your doctor tells you it's OK. Avoid activities such as vacuuming or swinging a golf club. Do not drive until your doctor give the OK and you are no longer taking prescription pain medications. It is also normal to have difficulty with sleep habits, eating and bowel movement after surgery. These will go away with time.  Bathing/Showering  You may shower after you go home. Do not soak in a bathtub, hot tub, or swim until the incision heals  completely.  Incision Care  Clean your incision with mild soap and water. Shower every day. Pat the area dry with a clean towel. You do not need a bandage unless otherwise instructed. Do not apply any ointments or creams to your incision. If you have open wounds you will be instructed how to care for them or a visiting nurse may be arranged for you. If you have staples or sutures along your incision they will be removed at your post-op appointment. You may have skin glue on your incision. Do not peel it off. It will come off on its own in about one week.  Wash the groin wound with soap and water daily and pat dry. (No tub bath-only shower)  Then put a dry gauze or washcloth in the groin to keep this area dry to help prevent wound infection.  Do this daily and as needed.  Do not use Vaseline or neosporin on your incisions.  Only use soap and water on your incisions and then protect and keep dry.  Diet  Resume your normal diet. There are no special food restrictions following this procedure. A low fat/ low cholesterol diet is recommended for all patients with vascular disease. In order to heal from your surgery, it is CRITICAL to get adequate nutrition. Your body requires vitamins, minerals,  and protein. Vegetables are the best source of vitamins and minerals. Vegetables also provide the perfect balance of protein. Processed food has little nutritional value, so try to avoid this.  Medications  Resume taking all your medications unless your doctor or Physician Assistant tells you not to. If your incision is causing pain, you may take over-the-counter pain relievers such as acetaminophen (Tylenol). If you were prescribed a stronger pain medication, please aware these medication can cause nausea and constipation. Prevent nausea by taking the medication with a snack or meal. Avoid constipation by drinking plenty of fluids and eating foods with high amount of fiber, such as fruits, vegetables, and grains. Take Colace 100 mg (an over-the-counter stool softener) twice a day as needed for constipation.  Do not take Tylenol if you are taking prescription pain medications.  Follow Up  Our office will schedule a follow up appointment 2-3 weeks following discharge.  Please call us immediately for any of the following conditions  .Severe or worsening pain in your legs or feet while at rest or while walking .Increase pain, redness, warmth, or drainage (pus) from your incision site(s) . Fever of 101 degree or higher . The swelling in your leg with the bypass suddenly worsens and becomes more painful than when you were in the hospital . If you have been instructed to feel your graft pulse then you should do so every day. If you can no longer feel this pulse, call the office immediately. Not all patients are given this instruction. .  Leg swelling is common after leg bypass surgery.  The swelling should improve over a few months following surgery. To improve the swelling, you may elevate your legs above the level of your heart while you are sitting or resting. Your surgeon or physician assistant may ask you to apply an ACE wrap or wear compression (TED) stockings to help to reduce  swelling.  Reduce your risk of vascular disease  Stop smoking. If you would like help call [facility] at 1-800-QUIT-NOW 909 091 4462) or [place] at (903)638-0699.  . Manage your cholesterol . Maintain a desired weight . Control your diabetes weight . Control your diabetes . Keep your  blood pressure down .  If you have any questions, please call the office at [phone]   Prescriptions given: 1.  Roxicet #20 No Refill  Disposition: home  Patient's condition: is Good  Follow up: 1. Dr. Oneida Alar in 2 weeks   Leontine Locket, PA-C [facility] [phone] 04/07/2020  12:03 PM  - For VQI Registry use ---   Post-op:  Wound infection: No  Graft infection: No  Transfusion: No    If yes, n/a units given New Arrhythmia: No Ipsilateral amputation: No, [ ]  Minor, [ ]  BKA, [ ]  AKA Discharge patency: [x ] Primary, [ ]  Primary assisted, [ ]  Secondary, [ ]  Occluded Patency judged by: [x ] Dopper only, [ ]  Palpable graft pulse, []  Palpable distal pulse, [ ]  ABI inc. > 0.15, [ ]  Duplex Discharge ABI: R 0.9, L 0.6 D/C Ambulatory Status: Ambulatory  Complications: MI: No, [ ]  Troponin only, [ ]  EKG or Clinical CHF: No Resp failure:No, [ ]  Pneumonia, [ ]  Ventilator Chg in renal function: No, [ ]  Inc. Cr > 0.5, [ ]  Temp. Dialysis,  [ ]  Permanent dialysis Stroke: No, [ ]  Minor, [ ]  Major Return to OR: No  Reason for return to OR: [ ]  Bleeding, [ ]  Infection, [ ]  Thrombosis, [ ]  Revision  Discharge medications: Statin use:  yes ASA use:  yes Plavix use:  no Beta blocker use: no CCB use:  No ACEI use:   no ARB use:  no Coumadin use: no

## 2020-04-06 NOTE — Progress Notes (Addendum)
  Progress Note    04/06/2020 7:16 AM 3 Days Post-Op  Subjective:  Anxious to go home  Afebrile HR 40's-60's  350'K-938'H systolic 82% RA  Vitals:   04/05/20 2323 04/06/20 0407  BP: (!) 153/85 123/69  Pulse: (!) 52   Resp: 19 18  Temp: 98.7 F (37.1 C) 98.6 F (37 C)  SpO2: 98% 98%    Physical Exam: Cardiac:  regular Lungs:  Non labored Incisions:  Right groin is clean and dry.  JP in tact  Extremities:  Brisk doppler signal right AT; right foot warm and well perfused.   CBC    Component Value Date/Time   WBC 11.4 (H) 04/04/2020 0013   RBC 4.09 (L) 04/04/2020 0013   HGB 12.2 (L) 04/04/2020 0013   HGB 14.2 02/28/2020 1107   HCT 37.3 (L) 04/04/2020 0013   HCT 42.6 02/28/2020 1107   PLT 146 (L) 04/04/2020 0013   PLT 201 02/28/2020 1107   MCV 91.2 04/04/2020 0013   MCV 89 02/28/2020 1107   MCH 29.8 04/04/2020 0013   MCHC 32.7 04/04/2020 0013   RDW 13.3 04/04/2020 0013   RDW 12.9 02/28/2020 1107   LYMPHSABS 1.7 12/15/2017 1458   MONOABS 0.6 12/15/2017 1458   EOSABS 0.4 12/15/2017 1458   BASOSABS 0.1 12/15/2017 1458    BMET    Component Value Date/Time   NA 136 04/04/2020 0013   NA 139 02/28/2020 1107   K 3.9 04/04/2020 0013   CL 103 04/04/2020 0013   CO2 22 04/04/2020 0013   GLUCOSE 139 (H) 04/04/2020 0013   BUN 18 04/04/2020 0013   BUN 19 02/28/2020 1107   CREATININE 1.06 04/04/2020 0013   CALCIUM 8.3 (L) 04/04/2020 0013   GFRNONAA >60 04/04/2020 0013   GFRAA >60 04/04/2020 0013    INR    Component Value Date/Time   INR 1.1 03/27/2020 1333     Intake/Output Summary (Last 24 hours) at 04/06/2020 0716 Last data filed at 04/06/2020 0607 Gross per 24 hour  Intake 680 ml  Output 120 ml  Net 560 ml     Assessment:  85 y.o. male is s/p:  Right femoral endarterectomy  3 Days Post-Op  Plan: -brisk doppler signal right AT -JP drain with 60cc out last shift and 120cc since 1300 yesterday.  Drainage will need to decrease prior to discharge.    -pt has ambulated in hallways 8-10x yesterday. -DVT prophylaxis:  Sq heparin   Leontine Locket, PA-C [facility] [phone] 04/06/2020 7:16 AM  Drain decreasing but still fairly high output. Will plan to d/c drain in am regardless of output and d/c home D/w pt ABI .9 right leg  Ruta Hinds, MD [facility] [place] Office: [phone]

## 2020-04-07 MED ORDER — OXYCODONE-ACETAMINOPHEN 5-325 MG PO TABS: 1.0000 | ORAL_TABLET | Freq: Four times a day (QID) | ORAL | 0 refills | Status: DC | PRN

## 2020-04-07 NOTE — Progress Notes (Signed)
Pt discharged today to home with family.  Pt's IV removed. Pt taken off telemetry and CCMD notified.  Pt left with all of their personal belongings.  AVS documentation reviewed with Pt and all questions answered.   

## 2020-04-07 NOTE — Progress Notes (Signed)
JP drain removed per MD order.  Pt tolerated well.  No complications to report at this time.  Pt will continue to be monitored.

## 2020-04-07 NOTE — Progress Notes (Addendum)
  Progress Note    04/07/2020 9:50 AM 4 Days Post-Op  Subjective:  Ready to go home.   Afebrile HR 50's SB 916'X-450'T systolic 88% RA  Vitals:   04/07/20 0321 04/07/20 0754  BP: 132/80 (!) 146/94  Pulse: 61 (!) 55  Resp: 13 18  Temp: 98.2 F (36.8 C) 97.7 F (36.5 C)  SpO2: 94% 96%    Physical Exam: Cardiac:  regular Lungs:  Non labored Incisions:  Right groin is clean and dry Extremities:  Palpable right AT pulse; right foot warm and well perfused.  JP with minimal serous drainage in bulb.   CBC    Component Value Date/Time   WBC 11.4 (H) 04/04/2020 0013   RBC 4.09 (L) 04/04/2020 0013   HGB 12.2 (L) 04/04/2020 0013   HGB 14.2 02/28/2020 1107   HCT 37.3 (L) 04/04/2020 0013   HCT 42.6 02/28/2020 1107   PLT 146 (L) 04/04/2020 0013   PLT 201 02/28/2020 1107   MCV 91.2 04/04/2020 0013   MCV 89 02/28/2020 1107   MCH 29.8 04/04/2020 0013   MCHC 32.7 04/04/2020 0013   RDW 13.3 04/04/2020 0013   RDW 12.9 02/28/2020 1107   LYMPHSABS 1.7 12/15/2017 1458   MONOABS 0.6 12/15/2017 1458   EOSABS 0.4 12/15/2017 1458   BASOSABS 0.1 12/15/2017 1458    BMET    Component Value Date/Time   NA 136 04/04/2020 0013   NA 139 02/28/2020 1107   K 3.9 04/04/2020 0013   CL 103 04/04/2020 0013   CO2 22 04/04/2020 0013   GLUCOSE 139 (H) 04/04/2020 0013   BUN 18 04/04/2020 0013   BUN 19 02/28/2020 1107   CREATININE 1.06 04/04/2020 0013   CALCIUM 8.3 (L) 04/04/2020 0013   GFRNONAA >60 04/04/2020 0013   GFRAA >60 04/04/2020 0013    INR    Component Value Date/Time   INR 1.1 03/27/2020 1333     Intake/Output Summary (Last 24 hours) at 04/07/2020 0950 Last data filed at 04/07/2020 0700 Gross per 24 hour  Intake 120 ml  Output 120 ml  Net 0 ml     Assessment:  85 y.o. male is s/p:  Right femoral endarterectomy  4 Days Post-Op  Plan: -pt with palpable right AT pulse -discontinue JP -discharge pt home and f/u with Dr. Oneida Alar in 2-3 weeks. -he continues to  ambulate in hallway    Leontine Locket, [place] [facility] [phone] 04/07/2020 9:50 AM  I have interviewed the patient and examined the patient. I agree with the findings by the PA.  Agree with plans for discharge today.  Gae Gallop, MD [phone]

## 2020-04-10 ENCOUNTER — Telehealth: Payer: Self-pay

## 2020-04-10 ENCOUNTER — Ambulatory Visit (INDEPENDENT_AMBULATORY_CARE_PROVIDER_SITE_OTHER): Payer: Self-pay | Admitting: Physician Assistant

## 2020-04-10 ENCOUNTER — Other Ambulatory Visit: Payer: Self-pay

## 2020-04-10 VITALS — BP 121/69 | HR 59 | Temp 96.7°F | Resp 20 | Ht 71.0 in | Wt 204.4 lb

## 2020-04-10 DIAGNOSIS — I779 Disorder of arteries and arterioles, unspecified: Secondary | ICD-10-CM

## 2020-04-10 NOTE — Progress Notes (Signed)
  POST OPERATIVE OFFICE NOTE    CC:  F/u for surgery  HPI:  This is a 85 y.o. male who is s/p Right femoral endarterectomy on 04/03/2020 by Dr. Oneida Alar.  JP drain was placed in the wound that produced  serosanguinous drainage> 120 cc daily.  The drain was discontinued Saturday prior to discharge.  He called today with reports of continued drainage and was scheduled to come into the office for examination and recommendations.    Pt returns today for follow up.  He states he had some clear drainage from the drain exit site on Sunday 6/27/2021and has kept a dry dressing over it.  He just wanted to have someone look at it to make sure it was OK.  He denise fever and chills.   He has been followed in our practice since 2018 for bilateral lower extremity claudication.  He believes his right leg may be slightly worse than the left.  He had bilateral ABIs performed in 2018 which were 0.7 on the right and left.  These were repeated on May 12 of this year and were 0.8 on the right 0.6 on the left.  Patient states that despite the fact his ABIs have remained relatively stable his walking distance has become shorter and shorter over time.  He is barely able to enjoy his golf game at this point because he is very limited in his walking distance.  He recently underwent an arteriogram by Dr. 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  He does not have rest pain.  He does not have tissue loss.    Allergies  Allergen Reactions  . Bee Venom Anaphylaxis    Yellow jackets    Current Outpatient Medications  Medication Sig Dispense Refill  . aspirin 81 MG tablet Take 81 mg by mouth daily.     Marland Kitchen EPINEPHrine (EPIPEN 2-PAK) 0.3 mg/0.3 mL IJ SOAJ injection Inject 0.3 mLs (0.3 mg total) into the muscle as needed for anaphylaxis. 1 each 0  . Fluticasone-Salmeterol (ADVAIR DISKUS) 250-50 MCG/DOSE AEPB inhale 1 dose by mouth twice a day (Patient taking differently:  Inhale 1 puff into the lungs daily. ) 60 each 5  . Multiple Vitamins-Minerals (PRESERVISION AREDS 2 PO) Take 1 capsule by mouth daily.    Marland Kitchen oxyCODONE-acetaminophen (PERCOCET) 5-325 MG tablet Take 1 tablet by mouth every 6 (six) hours as needed. 20 tablet 0  . rosuvastatin (CRESTOR) 20 MG tablet TAKE 1 TABLET(20 MG) BY MOUTH DAILY AT 6 PM 90 tablet 3  . triamterene-hydrochlorothiazide (MAXZIDE-25) 37.5-25 MG tablet Take 0.5 tablets by mouth daily. 45 tablet 3   No current facility-administered medications for this visit.     ROS:  See HPI  Physical Exam:    Incision:  Well healed without Hematome, no active drainage at drain site exit.  Dressing removed with SS drainage on old dressing.  No erythema or evidence of hematoma surrounding the incision. Extremities:  Palpable right AT pulse; right foot warm and well perfused Lungs non labored breathing   Assessment/Plan:  This is a 85 y.o. male who is s/p:Right femoral endarterectomy    He will continue with dressing changes as needed over the drainage exit site.  Shower daily as needed.  He will f/u with Dr. Oneida Alar in 2 weeks.  If he develops erythema, fever, or chills he will call sooner.  Roxy Horseman PA-C [facility] [phone]  Clinic MD:  Early

## 2020-04-10 NOTE — Telephone Encounter (Signed)
Triage call received from pt reporting last night when he got up, he noticed a "tear drop" of fluid running down his Rt leg from groin incision. He applied a pressure dressing. This morning he reports the tape strips came off and clear fluid drained down leg when standing. He denies any pain, swelling or fever. Pt is s/p Rt femoral endarterectomy on 04/03/20 with Dr. Oneida Alar. Scheduled pt for wound check today at 1015 with PA.Marland Kitchen Pt voiced understanding.

## 2020-04-12 ENCOUNTER — Telehealth: Payer: Self-pay

## 2020-04-12 ENCOUNTER — Other Ambulatory Visit: Payer: Self-pay | Admitting: *Deleted

## 2020-04-12 NOTE — Telephone Encounter (Signed)
Triage call received from pt reporting incision is hard, swollen at top of skin and bruised. Attempted to reach pt at home number. Phone rung continuously with no answer.

## 2020-04-12 NOTE — Patient Outreach (Signed)
[facility] Care Management  04/12/2020  Bryan Armstrong 06/11/1935 [id number]   Subjective: Telephone call to patient's home number, no answer, no answering machine or voicemail, and unable to leave a message.    Objective: Per KPN (Knowledge Performance Now, point of care tool) and chart review, patient hospitalized 04/03/2020 - 04/07/2020 for PAD (peripheral artery disease, status post Right femoral endarterectomy  on 04/03/2020.   Patient also has a history of COPD, hypertension, Dyslipidemia, colonic polyps, Emphysema of lung, Cataract (  bilateral cataracts removed) HYPERGLYCEMIA, and Former smoker.      Assessment: Received [facility] EMMI General Discharge Red Flag Alert follow up referral on 04/10/2020.   Red Flag Alert Trigger, Day # 1, patient answered no to the following question: Scheduled follow-up?   [facility] EMMI follow up pending patient contact.     Plan: RNCM will send unsuccessful outreach letter, [facility] pamphlet, handout: Know Before You Go, will call patient for 2nd telephone outreach attempt within 4 business days, [facility] EMMI follow up, and proceed with case closure, within 10 business days if no return call.      Earlyne Feeser H. Annia Friendly, BSN, [place] [facility] Telephonic CM Phone: [phone] Fax: [phone]

## 2020-04-12 NOTE — Telephone Encounter (Signed)
Spoke with pt about concerns. He reports right groin slightly swollen, hard and bruised. He denies any erythema, fever, or chills. Advised pt to apply warm compress to area and contact if swelling increases or develops erythema, fever, or chills. Pt voiced understanding.

## 2020-04-13 ENCOUNTER — Telehealth: Payer: Self-pay | Admitting: Family Medicine

## 2020-04-13 NOTE — Telephone Encounter (Signed)
Member inpatient admission fax completed to Medical records on 04/13/20

## 2020-04-17 ENCOUNTER — Other Ambulatory Visit: Payer: Self-pay

## 2020-04-17 ENCOUNTER — Ambulatory Visit: Payer: Self-pay | Admitting: *Deleted

## 2020-04-17 ENCOUNTER — Emergency Department (HOSPITAL_COMMUNITY): Payer: Medicare Other

## 2020-04-17 ENCOUNTER — Observation Stay (HOSPITAL_COMMUNITY)
Admission: EM | Admit: 2020-04-17 | Discharge: 2020-04-18 | Disposition: A | Discharge status: Home / Self Care | Payer: Medicare Other | Attending: Family Medicine | Admitting: Family Medicine

## 2020-04-17 ENCOUNTER — Telehealth: Payer: Self-pay | Admitting: Family Medicine

## 2020-04-17 ENCOUNTER — Encounter (HOSPITAL_COMMUNITY): Payer: Self-pay | Admitting: Emergency Medicine

## 2020-04-17 DIAGNOSIS — J449 Chronic obstructive pulmonary disease, unspecified: Secondary | ICD-10-CM | POA: Diagnosis present

## 2020-04-17 DIAGNOSIS — R7303 Prediabetes: Secondary | ICD-10-CM | POA: Insufficient documentation

## 2020-04-17 DIAGNOSIS — Z79899 Other long term (current) drug therapy: Secondary | ICD-10-CM | POA: Insufficient documentation

## 2020-04-17 DIAGNOSIS — Z03818 Encounter for observation for suspected exposure to other biological agents ruled out: Secondary | ICD-10-CM | POA: Diagnosis not present

## 2020-04-17 DIAGNOSIS — G459 Transient cerebral ischemic attack, unspecified: Principal | ICD-10-CM | POA: Insufficient documentation

## 2020-04-17 DIAGNOSIS — Z87891 Personal history of nicotine dependence: Secondary | ICD-10-CM | POA: Diagnosis not present

## 2020-04-17 DIAGNOSIS — R2681 Unsteadiness on feet: Secondary | ICD-10-CM | POA: Diagnosis not present

## 2020-04-17 DIAGNOSIS — Z7982 Long term (current) use of aspirin: Secondary | ICD-10-CM | POA: Diagnosis not present

## 2020-04-17 DIAGNOSIS — Z20822 Contact with and (suspected) exposure to covid-19: Secondary | ICD-10-CM | POA: Insufficient documentation

## 2020-04-17 DIAGNOSIS — E785 Hyperlipidemia, unspecified: Secondary | ICD-10-CM | POA: Diagnosis present

## 2020-04-17 DIAGNOSIS — J3489 Other specified disorders of nose and nasal sinuses: Secondary | ICD-10-CM | POA: Diagnosis not present

## 2020-04-17 DIAGNOSIS — I10 Essential (primary) hypertension: Secondary | ICD-10-CM | POA: Diagnosis not present

## 2020-04-17 DIAGNOSIS — I739 Peripheral vascular disease, unspecified: Secondary | ICD-10-CM | POA: Diagnosis present

## 2020-04-17 DIAGNOSIS — R42 Dizziness and giddiness: Secondary | ICD-10-CM | POA: Diagnosis not present

## 2020-04-17 LAB — I-STAT CHEM 8, ED
BUN: 24 mg/dL — ABNORMAL HIGH (ref 8–23)
Calcium, Ion: 1.09 mmol/L — ABNORMAL LOW (ref 1.15–1.40)
Chloride: 103 mmol/L (ref 98–111)
Creatinine, Ser: 1 mg/dL (ref 0.61–1.24)
Glucose, Bld: 113 mg/dL — ABNORMAL HIGH (ref 70–99)
HCT: 40 % (ref 39.0–52.0)
Hemoglobin: 13.6 g/dL (ref 13.0–17.0)
Potassium: 4 mmol/L (ref 3.5–5.1)
Sodium: 141 mmol/L (ref 135–145)
TCO2: 26 mmol/L (ref 22–32)

## 2020-04-17 LAB — COMPREHENSIVE METABOLIC PANEL
ALT: 24 U/L (ref 0–44)
AST: 20 U/L (ref 15–41)
Albumin: 3.6 g/dL (ref 3.5–5.0)
Alkaline Phosphatase: 64 U/L (ref 38–126)
Anion gap: 9 (ref 5–15)
BUN: 20 mg/dL (ref 8–23)
CO2: 25 mmol/L (ref 22–32)
Calcium: 8.6 mg/dL — ABNORMAL LOW (ref 8.9–10.3)
Chloride: 106 mmol/L (ref 98–111)
Creatinine, Ser: 1 mg/dL (ref 0.61–1.24)
GFR calc Af Amer: >60 mL/min (ref >60)
GFR calc non Af Amer: >60 mL/min (ref >60)
Glucose, Bld: 117 mg/dL — ABNORMAL HIGH (ref 70–99)
Potassium: 4.3 mmol/L (ref 3.5–5.1)
Sodium: 140 mmol/L (ref 135–145)
Total Bilirubin: 0.6 mg/dL (ref 0.3–1.2)
Total Protein: 6.3 g/dL — ABNORMAL LOW (ref 6.5–8.1)

## 2020-04-17 LAB — DIFFERENTIAL
Abs Immature Granulocytes: 0.02 10*3/uL (ref 0.00–0.07)
Basophils Absolute: 0.1 10*3/uL (ref 0.0–0.1)
Basophils Relative: 1 %
Eosinophils Absolute: 0.5 10*3/uL (ref 0.0–0.5)
Eosinophils Relative: 5 %
Immature Granulocytes: 0 %
Lymphocytes Relative: 16 %
Lymphs Abs: 1.5 10*3/uL (ref 0.7–4.0)
Monocytes Absolute: 0.5 10*3/uL (ref 0.1–1.0)
Monocytes Relative: 5 %
Neutro Abs: 6.9 10*3/uL (ref 1.7–7.7)
Neutrophils Relative %: 73 %

## 2020-04-17 LAB — CBC
HCT: 41 % (ref 39.0–52.0)
Hemoglobin: 12.7 g/dL — ABNORMAL LOW (ref 13.0–17.0)
MCH: 29.3 pg (ref 26.0–34.0)
MCHC: 31 g/dL (ref 30.0–36.0)
MCV: 94.5 fL (ref 80.0–100.0)
Platelets: 223 10*3/uL (ref 150–400)
RBC: 4.34 MIL/uL (ref 4.22–5.81)
RDW: 13.4 % (ref 11.5–15.5)
WBC: 9.4 10*3/uL (ref 4.0–10.5)
nRBC: 0 % (ref 0.0–0.2)

## 2020-04-17 LAB — PROTIME-INR
INR: 1.1 (ref 0.8–1.2)
Prothrombin Time: 13.7 seconds (ref 11.4–15.2)

## 2020-04-17 LAB — APTT: aPTT: 31 seconds (ref 24–36)

## 2020-04-17 MED ORDER — SODIUM CHLORIDE 0.9% FLUSH
3.0000 mL | Freq: Once | INTRAVENOUS | Status: AC
  Administered 2020-04-18: 3 mL via INTRAVENOUS

## 2020-04-17 NOTE — Telephone Encounter (Signed)
Patient states just a few minutes ago he felt a little light headed, to which happens when his BP gets low, took BP and it was 13/79. Just wanted to speak with someone about what happened and see if he needs to go to urgent care or not.

## 2020-04-17 NOTE — ED Triage Notes (Signed)
Pt c/o dizziness on standing and near syncope this morning on standing. Also c/o vision changes to his right eye x 3 days. A&O x 4, no weakness noted at this time. Denies chest pain/shortness of breath.

## 2020-04-18 ENCOUNTER — Encounter (HOSPITAL_COMMUNITY): Payer: Self-pay | Admitting: Internal Medicine

## 2020-04-18 ENCOUNTER — Observation Stay (HOSPITAL_COMMUNITY): Payer: Medicare Other

## 2020-04-18 ENCOUNTER — Ambulatory Visit: Payer: Self-pay | Admitting: *Deleted

## 2020-04-18 ENCOUNTER — Emergency Department (HOSPITAL_COMMUNITY): Payer: Medicare Other

## 2020-04-18 ENCOUNTER — Observation Stay (HOSPITAL_BASED_OUTPATIENT_CLINIC_OR_DEPARTMENT_OTHER): Payer: Medicare Other

## 2020-04-18 DIAGNOSIS — I10 Essential (primary) hypertension: Secondary | ICD-10-CM | POA: Diagnosis not present

## 2020-04-18 DIAGNOSIS — G459 Transient cerebral ischemic attack, unspecified: Secondary | ICD-10-CM | POA: Diagnosis not present

## 2020-04-18 DIAGNOSIS — I672 Cerebral atherosclerosis: Secondary | ICD-10-CM | POA: Diagnosis not present

## 2020-04-18 DIAGNOSIS — R42 Dizziness and giddiness: Secondary | ICD-10-CM | POA: Diagnosis not present

## 2020-04-18 DIAGNOSIS — I6523 Occlusion and stenosis of bilateral carotid arteries: Secondary | ICD-10-CM | POA: Diagnosis not present

## 2020-04-18 LAB — HEMOGLOBIN A1C
Hgb A1c MFr Bld: 5.7 % — ABNORMAL HIGH (ref 4.8–5.6)
Mean Plasma Glucose: 116.89 mg/dL

## 2020-04-18 LAB — LIPID PANEL
Cholesterol: 107 mg/dL (ref 0–200)
HDL: 38 mg/dL — ABNORMAL LOW (ref >40)
LDL Cholesterol: 57 mg/dL (ref 0–99)
Total CHOL/HDL Ratio: 2.8 RATIO
Triglycerides: 58 mg/dL (ref <150)
VLDL: 12 mg/dL (ref 0–40)

## 2020-04-18 LAB — CREATININE, SERUM
Creatinine, Ser: 0.93 mg/dL (ref 0.61–1.24)
GFR calc Af Amer: >60 mL/min (ref >60)
GFR calc non Af Amer: >60 mL/min (ref >60)

## 2020-04-18 LAB — ECHOCARDIOGRAM COMPLETE
Height: 71 in
Weight: 3280 oz

## 2020-04-18 LAB — SARS CORONAVIRUS 2 BY RT PCR (HOSPITAL ORDER, PERFORMED IN ~~LOC~~ HOSPITAL LAB): SARS Coronavirus 2: NEGATIVE

## 2020-04-18 LAB — CBC
HCT: 41 % (ref 39.0–52.0)
Hemoglobin: 13.1 g/dL (ref 13.0–17.0)
MCH: 29.8 pg (ref 26.0–34.0)
MCHC: 32 g/dL (ref 30.0–36.0)
MCV: 93.4 fL (ref 80.0–100.0)
Platelets: 222 10*3/uL (ref 150–400)
RBC: 4.39 MIL/uL (ref 4.22–5.81)
RDW: 13.4 % (ref 11.5–15.5)
WBC: 9.4 10*3/uL (ref 4.0–10.5)
nRBC: 0 % (ref 0.0–0.2)

## 2020-04-18 MED ORDER — ASPIRIN EC 81 MG PO TBEC
81.0000 mg | DELAYED_RELEASE_TABLET | Freq: Every day | ORAL | Status: DC
  Administered 2020-04-18: 81 mg via ORAL
  Filled 2020-04-18: qty 1

## 2020-04-18 MED ORDER — SODIUM CHLORIDE 0.9 % IV BOLUS
500.0000 mL | Freq: Once | INTRAVENOUS | Status: AC
  Administered 2020-04-18: 500 mL via INTRAVENOUS

## 2020-04-18 MED ORDER — IOHEXOL 350 MG/ML SOLN
80.0000 mL | Freq: Once | INTRAVENOUS | Status: AC | PRN
  Administered 2020-04-18: 80 mL via INTRAVENOUS

## 2020-04-18 MED ORDER — ACETAMINOPHEN 650 MG RE SUPP: 650.0000 mg | RECTAL | Status: DC | PRN

## 2020-04-18 MED ORDER — LORAZEPAM 2 MG/ML IJ SOLN
1.0000 mg | Freq: Once | INTRAMUSCULAR | Status: AC | PRN
  Administered 2020-04-18: 1 mg via INTRAVENOUS
  Filled 2020-04-18: qty 1

## 2020-04-18 MED ORDER — ROSUVASTATIN CALCIUM 20 MG PO TABS
20.0000 mg | ORAL_TABLET | Freq: Every day | ORAL | Status: DC
  Administered 2020-04-18: 20 mg via ORAL
  Filled 2020-04-18: qty 1

## 2020-04-18 MED ORDER — ACETAMINOPHEN 160 MG/5ML PO SOLN: 650.0000 mg | ORAL | Status: DC | PRN

## 2020-04-18 MED ORDER — ENOXAPARIN SODIUM 40 MG/0.4ML ~~LOC~~ SOLN
40.0000 mg | Freq: Every day | SUBCUTANEOUS | Status: DC
  Administered 2020-04-18: 40 mg via SUBCUTANEOUS
  Filled 2020-04-18 (×2): qty 0.4

## 2020-04-18 MED ORDER — CLOPIDOGREL BISULFATE 75 MG PO TABS: 75.0000 mg | ORAL_TABLET | Freq: Every day | ORAL | 3 refills | Status: DC

## 2020-04-18 MED ORDER — ROSUVASTATIN CALCIUM 40 MG PO TABS: 40.0000 mg | ORAL_TABLET | Freq: Every day | ORAL | 3 refills | Status: DC

## 2020-04-18 MED ORDER — STROKE: EARLY STAGES OF RECOVERY BOOK: Freq: Once | Status: DC

## 2020-04-18 MED ORDER — SODIUM CHLORIDE 0.9 % IV SOLN: INTRAVENOUS | Status: DC

## 2020-04-18 MED ORDER — HYDRALAZINE HCL 20 MG/ML IJ SOLN: 10.0000 mg | INTRAMUSCULAR | Status: DC | PRN

## 2020-04-18 MED ORDER — ACETAMINOPHEN 325 MG PO TABS: 650.0000 mg | ORAL_TABLET | ORAL | Status: DC | PRN

## 2020-04-18 MED ORDER — OXYCODONE-ACETAMINOPHEN 5-325 MG PO TABS: 1.0000 | ORAL_TABLET | Freq: Four times a day (QID) | ORAL | Status: DC | PRN

## 2020-04-18 MED ORDER — MOMETASONE FURO-FORMOTEROL FUM 200-5 MCG/ACT IN AERO
2.0000 | INHALATION_SPRAY | Freq: Two times a day (BID) | RESPIRATORY_TRACT | Status: DC
  Administered 2020-04-18: 2 via RESPIRATORY_TRACT
  Filled 2020-04-18 (×2): qty 8.8

## 2020-04-18 MED ORDER — CLOPIDOGREL BISULFATE 75 MG PO TABS
75.0000 mg | ORAL_TABLET | Freq: Every day | ORAL | Status: DC
  Administered 2020-04-18: 75 mg via ORAL
  Filled 2020-04-18: qty 1

## 2020-04-18 NOTE — Progress Notes (Signed)
OT Cancellation Note  Patient Details Name: Bryan Armstrong MRN: [id number] DOB: August 30, 1935   Cancelled Treatment:    Reason Eval/Treat Not Completed: OT screened, no needs identified, will sign off (Pt is presenting back to functional baseline, has assist at home from family. Known to this OT from previous admission. No further OT needs, OT will sign off. Thank you for this consult.)  Zenovia Jarred, MSOT, OTR/L [facility] Office Number: [phone] Pager: [phone]  Zenovia Jarred 04/18/2020, 5:39 PM

## 2020-04-18 NOTE — Consult Note (Signed)
Requesting Physician: Dr. Hal Hope    Chief Complaint: Sudden onset gait instability  History obtained from: Patient and Chart     HPI:                                                                                                                                       Bryan Armstrong is a 85 y.o. male with past medical history significant for hypertension, hyperlipidemia, peripheral artery disease, COPD, bradycardia, history of syncope presents to the ED with sudden onset gait instability to 2 p.m. on 04/17/2020.  According to the patient, he was with his daughter and he was just walking outside the door when he suddenly felt unable to keep his balance, leaning to the right side and slammed against the wall.  He denies any sensation of room spinning or lightheadedness, but felt little off.  No slurred speech or double vision.  Is abrupt in onset, patient then sat in a chair and after 5 minutes was his normal self. He has an episode 10 years ago of suddenly collapsing after getting up from the dining table at a restaurant-was attributed to vasovagal syncope  Patient takes aspirin daily.  Recently underwent right femoral endarterectomy on 6/22 with Dr. Oneida Alar.  Is due to get his left femoral endarterectomy 3 weeks from now.  Neurology consulted due to symptoms concerning for TIA  Date last known well: 04/17/2020 Time last known well: 2pm tPA Given: No, symptoms resolved NIHSS: 0 Baseline MRS 0     Past Medical History:  Diagnosis Date  . BRADYCARDIA 06/01/2007  . Cataract    bil cataracts removed  . COPD (chronic obstructive pulmonary disease) ([facility])   . EMPHYSEMA 03/28/2008  . Emphysema of lung ([place])   . GERD (gastroesophageal reflux disease)   . HYPERGLYCEMIA 06/01/2007  . HYPERLIPIDEMIA 06/01/2007  . Hypertension   . HYPERTENSION 01/19/2009  . Peripheral arterial disease [facility])     Past Surgical History:  Procedure Laterality Date  . ABDOMINAL AORTOGRAM W/LOWER EXTREMITY N/A  03/05/2020   Procedure: ABDOMINAL AORTOGRAM W/  LOWER EXTREMITY;  Surgeon: Lorretta Harp, MD;  Location: [place] [facility];  Service: Cardiovascular;  Laterality: N/A;  . CATARACT EXTRACTION, BILATERAL    . COLONOSCOPY    . ENDARTERECTOMY FEMORAL Right 04/03/2020   Procedure: Right Femoral Endarterectomy;  Surgeon: Elam Dutch, MD;  Location: [facility] OR;  Service: Vascular;  Laterality: Right;  . EYE SURGERY    . POLYPECTOMY    . TONSILLECTOMY AND ADENOIDECTOMY      Family History  Problem Relation Age of Onset  . Cancer Mother        "Male" cancer  . Uterine cancer Mother   . Heart disease Father   . Colon cancer Neg Hx   . Esophageal cancer Neg Hx   . Rectal cancer Neg Hx   . Stomach cancer Neg Hx   .  Liver cancer Neg Hx   . Pancreatic cancer Neg Hx   . Prostate cancer Neg Hx    Social History:  reports that he quit smoking about 2 years ago. His smoking use included cigarettes. He has a 20.00 pack-year smoking history. He has never used smokeless tobacco. He reports that he does not drink alcohol and does not use drugs.  Allergies:  Allergies  Allergen Reactions  . Bee Venom Anaphylaxis    Yellow jackets    Medications:                                                                                                                        I reviewed home medications  ROS:                                                                                                                                     14 systems reviewed and negative except above    Examination:                                                                                                      General: Appears well-developed  Psych: Affect appropriate to situation Eyes: No scleral injection HENT: No OP obstrucion Head: Normocephalic.  Cardiovascular: Normal rate and regular rhythm.  Respiratory: Effort normal and breath sounds normal to anterior ascultation GI: Soft.  No distension. There is  no tenderness.  Skin: WDI    Neurological Examinatio Mental Status: Alert, oriented, thought content appropriate.  Speech fluent without evidence of aphasia. Able to follow 3 step commands without difficulty. Cranial Nerves: II: Visual fields grossly normal,  III,IV, VI: ptosis not present, extra-ocular motions intact bilaterally, pupils equal, round, reactive to light and accommodation V,VII: smile symmetric, facial light touch sensation normal bilaterally VIII: hearing normal bilaterally IX,X: uvula rises symmetrically XI: bilateral shoulder shrug XII: midline tongue extension Motor: Right : Upper extremity   5/5    Left:     Upper extremity  5/5  Lower extremity   5/5     Lower extremity   5/5 Tone and bulk:normal tone throughout; no atrophy noted Sensory: Pinprick and light touch intact throughout, bilaterally Deep Tendon Reflexes: 2+ and symmetric throughout Plantars: Right: downgoing   Left: downgoing Cerebellar: normal finger-to-nose, normal rapid alternating movements and normal heel-to-shin test Gait: normal gait and station     Lab Results: Basic Metabolic Panel: Recent Labs  Lab 04/17/20 1801 04/17/20 1851  NA 140 141  K 4.3 4.0  CL 106 103  CO2 25  --   GLUCOSE 117* 113*  BUN 20 24*  CREATININE 1.00 1.00  CALCIUM 8.6*  --     CBC: Recent Labs  Lab 04/17/20 1801 04/17/20 1851  WBC 9.4  --   NEUTROABS 6.9  --   HGB 12.7* 13.6  HCT 41.0 40.0  MCV 94.5  --   PLT 223  --     Coagulation Studies: Recent Labs    04/17/20 1801  LABPROT 13.7  INR 1.1    Imaging: CT Angio Head W or Wo Contrast  Result Date: 04/18/2020 CLINICAL DATA:  Transient ischemic attack EXAM: CT ANGIOGRAPHY HEAD AND NECK TECHNIQUE: Multidetector CT imaging of the head and neck was performed using the standard protocol during bolus administration of intravenous contrast. Multiplanar CT image reconstructions and MIPs were obtained to evaluate the vascular anatomy. Carotid  stenosis measurements (when applicable) are obtained utilizing NASCET criteria, using the distal internal carotid diameter as the denominator. CONTRAST:  18mL OMNIPAQUE IOHEXOL 350 MG/ML SOLN COMPARISON:  None. FINDINGS: CTA NECK FINDINGS SKELETON: There is no bony spinal canal stenosis. No lytic or blastic lesion. OTHER NECK: Normal pharynx, larynx and major salivary glands. No cervical lymphadenopathy. Unremarkable thyroid gland. UPPER CHEST: No pneumothorax or pleural effusion. No nodules or masses. AORTIC ARCH: There is mild calcific atherosclerosis of the aortic arch. There is no aneurysm, dissection or hemodynamically significant stenosis of the visualized portion of the aorta. Conventional 3 vessel aortic branching pattern. The visualized proximal subclavian arteries are widely patent. RIGHT CAROTID SYSTEM: No dissection, occlusion or aneurysm. Mild atherosclerotic calcification at the carotid bifurcation without hemodynamically significant stenosis. LEFT CAROTID SYSTEM: No dissection, occlusion or aneurysm. Mild atherosclerotic calcification at the carotid bifurcation without hemodynamically significant stenosis. VERTEBRAL ARTERIES: Left dominant configuration. Both origins are clearly patent. There is no dissection, occlusion or flow-limiting stenosis to the skull base (V1-V3 segments). CTA HEAD FINDINGS POSTERIOR CIRCULATION: --Vertebral arteries: Normal V4 segments. --Inferior cerebellar arteries: Normal. --Basilar artery: Normal. --Superior cerebellar arteries: Normal. --Posterior cerebral arteries (PCA): Normal. Small right P-comm. No left P-comm visualized. ANTERIOR CIRCULATION: --Intracranial internal carotid arteries: Atherosclerotic calcification of the internal carotid arteries at the skull base without hemodynamically significant stenosis. --Anterior cerebral arteries (ACA): Normal. Hypoplastic right A1 segment, normal variant. --Middle cerebral arteries (MCA): Normal. VENOUS SINUSES: As permitted  by contrast timing, patent. ANATOMIC VARIANTS: None Review of the MIP images confirms the above findings. IMPRESSION: 1. No emergent large vessel occlusion or hemodynamically significant stenosis of the head or neck. 2. Aortic Atherosclerosis (ICD10-I70.0). Electronically Signed   By: Ulyses Jarred M.D.   On: 04/18/2020 03:40   CT HEAD WO CONTRAST  Result Date: 04/17/2020 CLINICAL DATA:  85 year old male with dizziness. EXAM: CT HEAD WITHOUT CONTRAST TECHNIQUE: Contiguous axial images were obtained from the base of the skull through the vertex without intravenous contrast. COMPARISON:  Head CT dated 01/15/2009. FINDINGS: Brain: Mild age-related atrophy and chronic microvascular ischemic changes. There is no acute  intracranial hemorrhage. No mass effect or midline shift. No extra-axial fluid collection. Vascular: No hyperdense vessel or unexpected calcification. Skull: Normal. Negative for fracture or focal lesion. Sinuses/Orbits: There is diffuse mucoperiosteal thickening of paranasal sinuses. No air-fluid level. The mastoid air cells are clear. Other: None IMPRESSION: 1. No acute intracranial pathology. 2. Mild age-related atrophy and chronic microvascular ischemic changes. Electronically Signed   By: Anner Crete M.D.   On: 04/17/2020 19:10   CT Angio Neck W and/or Wo Contrast  Result Date: 04/18/2020 CLINICAL DATA:  Transient ischemic attack EXAM: CT ANGIOGRAPHY HEAD AND NECK TECHNIQUE: Multidetector CT imaging of the head and neck was performed using the standard protocol during bolus administration of intravenous contrast. Multiplanar CT image reconstructions and MIPs were obtained to evaluate the vascular anatomy. Carotid stenosis measurements (when applicable) are obtained utilizing NASCET criteria, using the distal internal carotid diameter as the denominator. CONTRAST:  44mL OMNIPAQUE IOHEXOL 350 MG/ML SOLN COMPARISON:  None. FINDINGS: CTA NECK FINDINGS SKELETON: There is no bony spinal canal  stenosis. No lytic or blastic lesion. OTHER NECK: Normal pharynx, larynx and major salivary glands. No cervical lymphadenopathy. Unremarkable thyroid gland. UPPER CHEST: No pneumothorax or pleural effusion. No nodules or masses. AORTIC ARCH: There is mild calcific atherosclerosis of the aortic arch. There is no aneurysm, dissection or hemodynamically significant stenosis of the visualized portion of the aorta. Conventional 3 vessel aortic branching pattern. The visualized proximal subclavian arteries are widely patent. RIGHT CAROTID SYSTEM: No dissection, occlusion or aneurysm. Mild atherosclerotic calcification at the carotid bifurcation without hemodynamically significant stenosis. LEFT CAROTID SYSTEM: No dissection, occlusion or aneurysm. Mild atherosclerotic calcification at the carotid bifurcation without hemodynamically significant stenosis. VERTEBRAL ARTERIES: Left dominant configuration. Both origins are clearly patent. There is no dissection, occlusion or flow-limiting stenosis to the skull base (V1-V3 segments). CTA HEAD FINDINGS POSTERIOR CIRCULATION: --Vertebral arteries: Normal V4 segments. --Inferior cerebellar arteries: Normal. --Basilar artery: Normal. --Superior cerebellar arteries: Normal. --Posterior cerebral arteries (PCA): Normal. Small right P-comm. No left P-comm visualized. ANTERIOR CIRCULATION: --Intracranial internal carotid arteries: Atherosclerotic calcification of the internal carotid arteries at the skull base without hemodynamically significant stenosis. --Anterior cerebral arteries (ACA): Normal. Hypoplastic right A1 segment, normal variant. --Middle cerebral arteries (MCA): Normal. VENOUS SINUSES: As permitted by contrast timing, patent. ANATOMIC VARIANTS: None Review of the MIP images confirms the above findings. IMPRESSION: 1. No emergent large vessel occlusion or hemodynamically significant stenosis of the head or neck. 2. Aortic Atherosclerosis (ICD10-I70.0). Electronically  Signed   By: Ulyses Jarred M.D.   On: 04/18/2020 03:40     ASSESSMENT AND PLAN    85 y.o. male with past medical history significant for hypertension, hyperlipidemia, peripheral artery disease, COPD, bradycardia, history of syncope presents to the ED with sudden onset gait instability to 2 p.m. on 04/17/2020.  Description of event very concerning for posterior circulation TIA.  Absence of room spinning sensation makes this very unlikely to be peripheral vertigo.  No associated headache makes basilar migraine also very unlikely.  Transient ischemic attack versus mild acute ischemic stroke  Recommend # MRI of the brain without contrast #MRA Head and neck  #Transthoracic Echo, prolonged heart monitoring for paroxysmal A. fib # Start patient on aspirin 81 mg and Plavix 75 mg daily #Start or continue Atorvastatin 80 mg/other high intensity statin # BP goal: permissive HTN upto 220/120 mmHg  # HBAIC and Lipid profile # Telemetry monitoring # Frequent neuro checks #  stroke swallow screen  Please page stroke NP  Or  PA  Or MD from 8am -4 pm  as this patient from this time will be  followed by the stroke.   You can look them up on [email or web address]  Password [facility]    Cliford Sequeira [facility] Pager Number [phone]

## 2020-04-18 NOTE — Progress Notes (Signed)
  Echocardiogram 2D Echocardiogram has been performed.  Bryan Armstrong 04/18/2020, 4:26 PM

## 2020-04-18 NOTE — Progress Notes (Signed)
STROKE TEAM PROGRESS NOTE   INTERVAL HISTORY I had a long discussion with the patient and his daughter at the bedside and obtained history of presenting illness, reviewed electronic medical records and imaging films in PACS.  He presented with transient episode of gait ataxia with leaning to the right and hitting the wall lasted probably 5 to 10 minutes.  He also had a transient episode of monocular altitudinal vision loss in the right eye in the upper quadrant 2 days ago which lasted only few minutes.  MRI scan of the brain is negative for acute infarct.  CT angiogram showed no significant large vessel stenosis or occlusion.  LDL cholesterol is 57 mg percent and hemoglobin A1c is 5.7.  Echocardiogram is pending.  Vitals:   04/18/20 0559 04/18/20 0614 04/18/20 0937 04/18/20 1030  BP: (!) 132/102 (!) 128/92 (!) 141/46 (!) 171/77  Pulse: (!) 55 (!) 53 (!) 58 (!) 55  Resp: 15 17  14   Temp:      TempSrc:      SpO2: 96% 95% 96% 98%  Weight:      Height:        CBC:  Recent Labs  Lab 04/17/20 1801 04/17/20 1801 04/17/20 1851 04/18/20 0528  WBC 9.4  --   --  9.4  NEUTROABS 6.9  --   --   --   HGB 12.7*   < > 13.6 13.1  HCT 41.0   < > 40.0 41.0  MCV 94.5  --   --  93.4  PLT 223  --   --  222   < > = values in this interval not displayed.    Basic Metabolic Panel:  Recent Labs  Lab 04/17/20 1801 04/17/20 1801 04/17/20 1851 04/18/20 0528  NA 140  --  141  --   K 4.3  --  4.0  --   CL 106  --  103  --   CO2 25  --   --   --   GLUCOSE 117*  --  113*  --   BUN 20  --  24*  --   CREATININE 1.00   < > 1.00 0.93  CALCIUM 8.6*  --   --   --    < > = values in this interval not displayed.   Lipid Panel:     Component Value Date/Time   CHOL 107 04/18/2020 0528   TRIG 58 04/18/2020 0528   HDL 38 (L) 04/18/2020 0528   CHOLHDL 2.8 04/18/2020 0528   VLDL 12 04/18/2020 0528   LDLCALC 57 04/18/2020 0528   HgbA1c:  Lab Results  Component Value Date   HGBA1C 5.7 (H) 04/18/2020    Urine Drug Screen: No results found for: LABOPIA, COCAINSCRNUR, LABBENZ, AMPHETMU, THCU, LABBARB  Alcohol Level No results found for: ETH  IMAGING past 24 hours CT Angio Head W or Wo Contrast  Result Date: 04/18/2020 CLINICAL DATA:  Transient ischemic attack EXAM: CT ANGIOGRAPHY HEAD AND NECK TECHNIQUE: Multidetector CT imaging of the head and neck was performed using the standard protocol during bolus administration of intravenous contrast. Multiplanar CT image reconstructions and MIPs were obtained to evaluate the vascular anatomy. Carotid stenosis measurements (when applicable) are obtained utilizing NASCET criteria, using the distal internal carotid diameter as the denominator. CONTRAST:  47mL OMNIPAQUE IOHEXOL 350 MG/ML SOLN COMPARISON:  None. FINDINGS: CTA NECK FINDINGS SKELETON: There is no bony spinal canal stenosis. No lytic or blastic lesion. OTHER NECK: Normal pharynx, larynx and major  salivary glands. No cervical lymphadenopathy. Unremarkable thyroid gland. UPPER CHEST: No pneumothorax or pleural effusion. No nodules or masses. AORTIC ARCH: There is mild calcific atherosclerosis of the aortic arch. There is no aneurysm, dissection or hemodynamically significant stenosis of the visualized portion of the aorta. Conventional 3 vessel aortic branching pattern. The visualized proximal subclavian arteries are widely patent. RIGHT CAROTID SYSTEM: No dissection, occlusion or aneurysm. Mild atherosclerotic calcification at the carotid bifurcation without hemodynamically significant stenosis. LEFT CAROTID SYSTEM: No dissection, occlusion or aneurysm. Mild atherosclerotic calcification at the carotid bifurcation without hemodynamically significant stenosis. VERTEBRAL ARTERIES: Left dominant configuration. Both origins are clearly patent. There is no dissection, occlusion or flow-limiting stenosis to the skull base (V1-V3 segments). CTA HEAD FINDINGS POSTERIOR CIRCULATION: --Vertebral arteries: Normal V4  segments. --Inferior cerebellar arteries: Normal. --Basilar artery: Normal. --Superior cerebellar arteries: Normal. --Posterior cerebral arteries (PCA): Normal. Small right P-comm. No left P-comm visualized. ANTERIOR CIRCULATION: --Intracranial internal carotid arteries: Atherosclerotic calcification of the internal carotid arteries at the skull base without hemodynamically significant stenosis. --Anterior cerebral arteries (ACA): Normal. Hypoplastic right A1 segment, normal variant. --Middle cerebral arteries (MCA): Normal. VENOUS SINUSES: As permitted by contrast timing, patent. ANATOMIC VARIANTS: None Review of the MIP images confirms the above findings. IMPRESSION: 1. No emergent large vessel occlusion or hemodynamically significant stenosis of the head or neck. 2. Aortic Atherosclerosis (ICD10-I70.0). Electronically Signed   By: Ulyses Jarred M.D.   On: 04/18/2020 03:40   CT HEAD WO CONTRAST  Result Date: 04/17/2020 CLINICAL DATA:  85 year old male with dizziness. EXAM: CT HEAD WITHOUT CONTRAST TECHNIQUE: Contiguous axial images were obtained from the base of the skull through the vertex without intravenous contrast. COMPARISON:  Head CT dated 01/15/2009. FINDINGS: Brain: Mild age-related atrophy and chronic microvascular ischemic changes. There is no acute intracranial hemorrhage. No mass effect or midline shift. No extra-axial fluid collection. Vascular: No hyperdense vessel or unexpected calcification. Skull: Normal. Negative for fracture or focal lesion. Sinuses/Orbits: There is diffuse mucoperiosteal thickening of paranasal sinuses. No air-fluid level. The mastoid air cells are clear. Other: None IMPRESSION: 1. No acute intracranial pathology. 2. Mild age-related atrophy and chronic microvascular ischemic changes. Electronically Signed   By: Anner Crete M.D.   On: 04/17/2020 19:10   CT Angio Neck W and/or Wo Contrast  Result Date: 04/18/2020 CLINICAL DATA:  Transient ischemic attack EXAM: CT  ANGIOGRAPHY HEAD AND NECK TECHNIQUE: Multidetector CT imaging of the head and neck was performed using the standard protocol during bolus administration of intravenous contrast. Multiplanar CT image reconstructions and MIPs were obtained to evaluate the vascular anatomy. Carotid stenosis measurements (when applicable) are obtained utilizing NASCET criteria, using the distal internal carotid diameter as the denominator. CONTRAST:  55mL OMNIPAQUE IOHEXOL 350 MG/ML SOLN COMPARISON:  None. FINDINGS: CTA NECK FINDINGS SKELETON: There is no bony spinal canal stenosis. No lytic or blastic lesion. OTHER NECK: Normal pharynx, larynx and major salivary glands. No cervical lymphadenopathy. Unremarkable thyroid gland. UPPER CHEST: No pneumothorax or pleural effusion. No nodules or masses. AORTIC ARCH: There is mild calcific atherosclerosis of the aortic arch. There is no aneurysm, dissection or hemodynamically significant stenosis of the visualized portion of the aorta. Conventional 3 vessel aortic branching pattern. The visualized proximal subclavian arteries are widely patent. RIGHT CAROTID SYSTEM: No dissection, occlusion or aneurysm. Mild atherosclerotic calcification at the carotid bifurcation without hemodynamically significant stenosis. LEFT CAROTID SYSTEM: No dissection, occlusion or aneurysm. Mild atherosclerotic calcification at the carotid bifurcation without hemodynamically significant stenosis. VERTEBRAL ARTERIES: Left dominant configuration.  Both origins are clearly patent. There is no dissection, occlusion or flow-limiting stenosis to the skull base (V1-V3 segments). CTA HEAD FINDINGS POSTERIOR CIRCULATION: --Vertebral arteries: Normal V4 segments. --Inferior cerebellar arteries: Normal. --Basilar artery: Normal. --Superior cerebellar arteries: Normal. --Posterior cerebral arteries (PCA): Normal. Small right P-comm. No left P-comm visualized. ANTERIOR CIRCULATION: --Intracranial internal carotid arteries:  Atherosclerotic calcification of the internal carotid arteries at the skull base without hemodynamically significant stenosis. --Anterior cerebral arteries (ACA): Normal. Hypoplastic right A1 segment, normal variant. --Middle cerebral arteries (MCA): Normal. VENOUS SINUSES: As permitted by contrast timing, patent. ANATOMIC VARIANTS: None Review of the MIP images confirms the above findings. IMPRESSION: 1. No emergent large vessel occlusion or hemodynamically significant stenosis of the head or neck. 2. Aortic Atherosclerosis (ICD10-I70.0). Electronically Signed   By: Ulyses Jarred M.D.   On: 04/18/2020 03:40   MR BRAIN WO CONTRAST  Result Date: 04/18/2020 CLINICAL DATA:  Dizziness EXAM: MRI HEAD WITHOUT CONTRAST TECHNIQUE: Multiplanar, multiecho pulse sequences of the brain and surrounding structures were obtained without intravenous contrast. COMPARISON:  CTA from yesterday FINDINGS: Brain: No acute infarction, hemorrhage, hydrocephalus, extra-axial collection or mass lesion. Mild for age cerebral volume loss and chronic small vessel ischemic change. Vascular: Normal flow voids Skull and upper cervical spine: Normal marrow signal Sinuses/Orbits: Generalized mucosal thickening in paranasal sinuses. Bilateral cataract resection. Other: Unremarkable appearance of the temporal bones IMPRESSION: 1. Aging brain without acute or reversible finding. 2. Generalized sinusitis. Electronically Signed   By: Monte Fantasia M.D.   On: 04/18/2020 07:39    PHYSICAL EXAM Pleasant elderly Caucasian male not in distress.  He is hard of hearing. . Afebrile. Head is nontraumatic. Neck is supple without bruit.    Cardiac exam no murmur or gallop. Lungs are clear to auscultation. Distal pulses are well felt. Neurological Exam ;  Awake  Alert oriented x 3. Normal speech and language.eye movements full without nystagmus.fundi were not visualized. Vision acuity and Bryan Armstrong appear normal. Hearing is normal. Palatal movements are  normal. Face symmetric. Tongue midline. Normal strength, tone, reflexes and coordination. Normal sensation. Gait deferred.  ASSESSMENT/PLAN Bryan Armstrong is a 85 y.o. male with history of hypertension, hyperlipidemia, peripheral artery disease, COPD, bradycardia, syncope, recent R femoral endarterectomy 6/22 with plans for L femoral endarterectomy in 3 weeks presenting with gait instability, leaning to the R.   Posterior circulation TIA.  2 days ago episode of altitudinal right eye vision loss also retinal TIA  CT head No acute abnormality. Mild Small vessel disease & Atrophy.   CTA head & neck no ELVO. Aortic atherosclerosis.   MRI  No acute abnormality, sinusitis.  2D Echo pending  EEG normal  LDL 57  HgbA1c 5.7  VTE prophylaxis - Lovenox 40 mg sq daily   aspirin 81 mg daily prior to admission, now on aspirin 81 mg daily. Add plavix and treat with DAPT x 3 weeks then plavix alone.   Therapy recommendations:  No therapy needs  Disposition:  Return home  Hypertension  Stable on the high side 140-170s . BP goal normotensive  Hyperlipidemia  Home meds:  crestor 20, resumed in hospital  LDL 57, goal < 70  Continue statin at discharge  Other Stroke Risk Factors  Advanced age  Former Cigarette smoker, quit 2 yrs ago  Overweight, Body mass index is 28.59 kg/m., recommend weight loss, diet and exercise as appropriate   PAD- recent R femoral endarterectomy 6/22 with plans for L femoral endarterectomy in 3 weeks (Bryan Armstrong)  Other Active Problems  COPD  Anemia   Hospital day # 0  He presented with transient episode of gait ataxia likely posterior circulation TIA with 2 days prior episode of altitudinal vision loss in the right eye likely retinal TIA.  Recommend aspirin Plavix for 3 weeks followed by Plavix alone.  Patient is concerned that he needs dental surgery as well as hip surgery.  Unless this is an emergency recommend waiting for 3 months which is the  highest risk.  For recurrent strokes prior to stopping antiplatelet agents.  However if the surgeons are willing to operate while patient is taking baby aspirin then he can have the surgery sooner.  Long discussion with the patient and his daughter at the bedside and answered questions.  Discussed with Dr. Eudelia Bunch follow-up as an outpatient with stroke clinic in 6 weeks.  Greater than 50% time during this 35-minute visit was spent on counseling and coordination of care and discussion about TIAs and recurrence and prevention and answering questions and perisurgical risk while holding antiplatelets for elective surgery.  Antony Contras, MD  To contact Stroke Continuity provider, please refer to [email or web address] After hours, contact General Neurology

## 2020-04-18 NOTE — Procedures (Signed)
ELECTROENCEPHALOGRAM REPORT   Patient: Bryan Armstrong       Room #: 039C EEG No. ID: [id number] Age: 85 y.o.        Sex: male Requesting Physician: Danford Report Date:  04/18/2020        Interpreting Physician: Alexis Goodell  History: Bryan Armstrong is an 85 y.o. male with an episode of confusion  Medications:  ASA, Plavix, Dulera, Crestor  Conditions of Recording:  This is a 21 channel routine scalp EEG performed with bipolar and monopolar montages arranged in accordance to the international 10/20 system of electrode placement. One channel was dedicated to EKG recording.  The patient is in the awake and drowsy states.  Description:  The waking background activity consists of a low voltage, symmetrical, fairly well organized, 9 Hz alpha activity, seen from the parieto-occipital and posterior temporal regions.  Low voltage fast activity, poorly organized, is seen anteriorly and is at times superimposed on more posterior regions.  A mixture of theta and alpha rhythms are seen from the central and temporal regions. The patient drowses with slowing to irregular, low voltage theta and beta activity.   Stage II sleep is not obtained. No epileptiform activity is noted.   Hyperventilation was not performed.  Intermittent photic stimulation was performed but failed to illicit any change in the tracing.  (and elicits a symmetrical driving response but fails to elicit any abnormalities.  IMPRESSION: Normal electroencephalogram, awake, drowsy and with activation procedures. There are no focal lateralizing or epileptiform features.   Alexis Goodell, MD Neurology [phone] 04/18/2020, 1:16 PM

## 2020-04-18 NOTE — Progress Notes (Signed)
Pt is noted to have some unsteadiness with gait, while walking with min guard and occas min assist to steady.  His daughter is in attendance at [facility] session, and is able to observe his safety issues.  Pt has adult children of his wife in his house, and wife in SNF.  Will anticipate he will have 24/7 help as requested, and if not will need to consider a rehab placement.  HHPT to follow up and then outpatient if needed.   04/18/20 1700  PT Visit Information  Last PT Received On 04/18/20  Assistance Needed +1  History of Present Illness 85 yo was brought to ED for weakness, gait changes overnight.  Had negative imaging for brain and neck.   Suspected post circulation TIA.  PMHx:  recent R femoral endarterectomy, bradycardia, COPD, emphysema, GERD, HLD, HTN, PAD,   Precautions  Precautions Fall  Precaution Comments unstable gait  Restrictions  Weight Bearing Restrictions No  Home Living  Family/patient expects to be discharged to: Private residence  Living Arrangements Spouse/significant other  Available Help at Discharge Family;Available 24 hours/day  Type of Home House  Home Access Stairs to enter  Entrance Stairs-Number of Steps 3  Entrance Stairs-Rails Right  Home Layout One level  Bathroom Shower/Tub Walk-in shower  Bathroom Toilet Handicapped height  Bathroom Accessibility Yes  Home Equipment Shower seat  Additional Comments no AD needed, has been recently unsteady  Prior Function  Level of Independence Independent  Comments helps care for wife whom has MS. Wife is currently in SNF but coming home a week after pt is d/c  Communication  Communication No difficulties  Pain Assessment  Pain Assessment No/denies pain  Cognition  Arousal/Alertness Awake/alert  Behavior During Therapy WFL for tasks assessed/performed  Overall Cognitive Status Within Functional Limits for tasks assessed  General Comments has been able to give a history, has been a bit short intially  Upper Extremity  Assessment  Upper Extremity Assessment Overall WFL for tasks assessed  Lower Extremity Assessment  Lower Extremity Assessment Overall WFL for tasks assessed  Cervical / Trunk Assessment  Cervical / Trunk Assessment Normal  Bed Mobility  Overal bed mobility Modified Independent  General bed mobility comments sits up but is awkward  Transfers  Overall transfer level Needs assistance  Equipment used 1 person hand held assist  Transfers Sit to/from Stand  Sit to Stand Min guard  General transfer comment min guard to steady  Ambulation/Gait  Ambulation/Gait assistance Min guard;Min assist  Gait Distance (Feet) 250 Feet  Assistive device 1 person hand held assist  General Gait Details pt has listing to R side mult times with walk including initially, does not see it as an issue  Gait velocity controlled  Gait velocity interpretation <1.31 ft/sec, indicative of household ambulator  Balance  Overall balance assessment Needs assistance  Sitting-balance support Feet supported  Sitting balance-Leahy Scale Fair  Standing balance support No upper extremity supported  Standing balance-Leahy Scale Fair  Standing balance comment fair to fair- dynamic movement  General Comments  General comments (skin integrity, edema, etc.) Pt is walking on the hallway with mult losses of balance, very clear to PT that this is not an issue.  Will need close supervision until this is resolved, with hands on assist if needed  Exercises  Exercises Other exercises (strength in LE's is WFL BLE's)  PT - End of Session  Equipment Utilized During Treatment Gait belt  Activity Tolerance Treatment limited secondary to medical complications (Comment)  Patient  left in bed;with call bell/phone within reach;with family/visitor present  Nurse Communication Mobility status  PT Assessment  PT Recommendation/Assessment Patient needs continued PT services  PT Visit Diagnosis Unsteadiness on feet (R26.81);Difficulty in walking,  not elsewhere classified (R26.2);Dizziness and giddiness (R42)  PT Problem List Decreased range of motion;Decreased activity tolerance;Decreased balance;Decreased mobility;Decreased coordination;Decreased knowledge of use of DME;Cardiopulmonary status limiting activity;Decreased skin integrity  Barriers to Discharge Decreased caregiver support;Inaccessible home environment (has his wife's children with him but wife in SNF)  Barriers to Discharge Comments limitations for home if help is not 24/7  PT Plan  PT Frequency (ACUTE ONLY) Min 3X/week  PT Treatment/Interventions (ACUTE ONLY) DME instruction;Gait training;Stair training;Functional mobility training;Therapeutic activities;Therapeutic exercise;Balance training;Neuromuscular re-education;Patient/family education  AM-PAC PT "6 Clicks" Mobility Outcome Measure (Version 2)  Help needed turning from your back to your side while in a flat bed without using bedrails? 4  Help needed moving from lying on your back to sitting on the side of a flat bed without using bedrails? 4  Help needed moving to and from a bed to a chair (including a wheelchair)? 3  Help needed standing up from a chair using your arms (e.g., wheelchair or bedside chair)? 3  Help needed to walk in hospital room? 3  Help needed climbing 3-5 steps with a railing?  3  6 Click Score 20  Consider Recommendation of Discharge To: Home with no services  PT Recommendation  Follow Up Recommendations Outpatient PT;Supervision for mobility/OOB;Home health PT (outpatient to follow PT at home)  PT equipment None recommended by PT  Individuals Consulted  Consulted and Agree with Results and Recommendations Patient;Family member/caregiver  Family Member Consulted daughter who lives 8 minutes from pt  Acute Rehab PT Goals  Patient Stated Goal return home with wife  PT Goal Formulation With patient/family  Time For Goal Achievement 05/02/20  Potential to Achieve Goals Good  PT Time  Calculation  PT Start Time (ACUTE ONLY) 1038  PT Stop Time (ACUTE ONLY) 1108  PT Time Calculation (min) (ACUTE ONLY) 30 min  PT General Charges  $$ ACUTE PT VISIT 1 Visit  PT Evaluation  $PT Eval Moderate Complexity 1 Mod  PT Treatments  $Gait Training 8-22 mins  Written Expression  Dominant Hand Right    Mee Hives, PT MS Acute Rehab Dept. Number: [place]

## 2020-04-18 NOTE — Telephone Encounter (Signed)
LVM to return call.

## 2020-04-18 NOTE — Discharge Summary (Signed)
Physician Discharge Summary  [name] QIH:[id number] DOB: Jul 28, 1935 DOA: 04/17/2020  PCP: Vivi Barrack, MD  Admit date: 04/17/2020 Discharge date: 04/18/2020  Admitted From: Home  Disposition:  Home   Recommendations for Outpatient Follow-up:  1. Follow up with PCP Dr. Jerline Pain in 1-2 weeks for blood pressure management 2. Follow up with Neurology Dr. Leonie Man in 6 weeks 3. Follow up with Oral Surgeon with plans to delay oral surgery for 3 months if possible, or see below 4. Dr. Jerline Pain: Referral to Cardiology for cardiac monitoring was made, please follow up to confirm patient has made this appointment   Home Health: None  Equipment/Devices: None  Discharge Condition: Good  CODE STATUS: FULL Diet recommendation: Cardiac  Brief/Interim Summary: [name] is an 85 y.o. M with hx PVD and recent right femoral endarterectomy by Dr. Oneida Alar who presented with acute ataxia.  Patient was in his usual health until the day of admission, he was at home, walking and had sudden onset of imbalance and fell to the right.       PRINCIPAL HOSPITAL DIAGNOSIS: TIA    Discharge Diagnoses:   TIA Patient's symptoms had resolved by time of arrival to ER.  Was admitted and underwent MRI which showed no evidence of stroke.  MRI showed sinusitis, but it was felt unlikely this alone accounted for ataxic spell.  Neurology were consulted. Carotid imaging showed no stenoses.  Echocardiogram unremarkable. EEG unremarkable.  Telemetry monitoring overnight showed no evidence of Atrial fibrillation.  LDL good.  Given patient was non-adherent to aspirin prior to admission, this was not felt to be a treatment failure and Neurology recommended aspirin and Plavix for 3 weeks followed by low dose aspirin alone indefinitely.  Continue Crestor 40 mg nightly, dose recently increased. Cardiology will contact the patient for 30 day event monitor   Pre-diabetes Patient noted to have HgbA1c 5.7%.  Counseled on  diet modification.   Hypertension BP well controlled at admission. -Continue HCTZ-triamterene and follow up closely with PCP  Upcoming surgery Patient recommended to continue aspirin and Plavix for 3 weeks minimum.  After 3 weeks, patient should continue aspirin 81 mg without stopping for three months.   If oral surgery or opposite femoral endarterectomy can be performed while on aspirin, they may proceed after three weeks.  Otherwise, they should be postponed until October.        Discharge Instructions  Discharge Instructions    Ambulatory referral to Neurology   Complete by: As directed    An appointment is requested in approximately: 4 weeks   Diet - low sodium heart healthy   Complete by: As directed    Discharge instructions   Complete by: As directed    From Dr. Loleta Books: You were admitted for a TIA or 'transient ischemic attack' This is a "mini-stroke" or a stroke event that was brief enough that no lasting damage to the brain occurred, thank goodness.  The follow up treatment after a TIA however, is essentially the same as after a stroke: Strict control of blood pressure, improvement in diet, control of cholesterol, and taking aspirin.  Continue your triamterene/HCTZ for blood pressure Follow up with your primary care doctor in 1 week for a routine hospital follow up  For cholesterol, your numbers were at goal, continue Crestor 40 mg nightly  You have pre-diabetes, discuss this further with Dr. Jerline Pain  For the next three weeks: Take aspirin 81 mg AND clopidogrel/Plavix 75 mg  Plavix is like a "super aspirin"  After three weeks, stop Plavix and take aspirin alone, (aspirin 81 mg once daily, life long).   Call your oral surgeon and explain the following: "I had a TIA (ministroke) on July 6 and have to be on aspirin 81 mg and Plavix until July 28th.  My stroke doctor has recommended that I delay the dental procedure at least until then.    It is recommended  that I continue aspirin for at least 3 months from my TIA without stopping.  If it is possible to perform the oral surgery while taking aspirin 81 mg, it would be okay to perform the oral surgery any time after July 28th.    If it is necessary to stop aspirin for the surgery, the stroke team recommended waiting 3 months (until Oct 6th) before stopping aspirin."  Tell them that and ask what they recommend  Go see the Neurologist in 6 weeks   Increase activity slowly   Complete by: As directed      Allergies as of 04/18/2020      Reactions   Bee Venom Anaphylaxis   Yellow jackets      Medication List    TAKE these medications   acetaminophen 325 MG tablet Commonly known as: TYLENOL Take 325-650 mg by mouth every 6 (six) hours as needed for mild pain.   aspirin 81 MG tablet Take 81 mg by mouth daily.   clopidogrel 75 MG tablet Commonly known as: PLAVIX Take 1 tablet (75 mg total) by mouth daily. Start taking on: April 19, 2020   EPINEPHrine 0.3 mg/0.3 mL Soaj injection Commonly known as: EpiPen 2-Pak Inject 0.3 mLs (0.3 mg total) into the muscle as needed for anaphylaxis.   fluticasone 50 MCG/ACT nasal spray Commonly known as: FLONASE Place 2 sprays into both nostrils daily as needed for allergies or rhinitis.   Fluticasone-Salmeterol 250-50 MCG/DOSE Aepb Commonly known as: Advair Diskus inhale 1 dose by mouth twice a day What changed:   how much to take  how to take this  when to take this  additional instructions   oxyCODONE-acetaminophen 5-325 MG tablet Commonly known as: Percocet Take 1 tablet by mouth every 6 (six) hours as needed. What changed: reasons to take this   PRESERVISION AREDS 2 PO Take 1 capsule by mouth daily.   rosuvastatin 40 MG tablet Commonly known as: CRESTOR Take 1 tablet (40 mg total) by mouth daily at 6 PM. What changed:   medication strength  See the new instructions.   triamterene-hydrochlorothiazide 37.5-25 MG tablet Commonly  known as: MAXZIDE-25 Take 0.5 tablets by mouth daily.       Follow-up Information    Vivi Barrack, MD. Schedule an appointment as soon as possible for a visit in 1 week(s).   Specialty: Family Medicine Contact information: [place] [address] [phone]        Garvin Fila, MD. Schedule an appointment as soon as possible for a visit in 6 week(s).   Specialties: Neurology, Radiology Contact information: [address] [place] [address] [phone]              Allergies  Allergen Reactions  . Bee Venom Anaphylaxis    Yellow jackets    Consultations:  Neurology   Procedures/Studies: CT Angio Head W or Wo Contrast  Result Date: 04/18/2020 CLINICAL DATA:  Transient ischemic attack EXAM: CT ANGIOGRAPHY HEAD AND NECK TECHNIQUE: Multidetector CT imaging of the head and neck was performed using the standard protocol  during bolus administration of intravenous contrast. Multiplanar CT image reconstructions and MIPs were obtained to evaluate the vascular anatomy. Carotid stenosis measurements (when applicable) are obtained utilizing NASCET criteria, using the distal internal carotid diameter as the denominator. CONTRAST:  60mL OMNIPAQUE IOHEXOL 350 MG/ML SOLN COMPARISON:  None. FINDINGS: CTA NECK FINDINGS SKELETON: There is no bony spinal canal stenosis. No lytic or blastic lesion. OTHER NECK: Normal pharynx, larynx and major salivary glands. No cervical lymphadenopathy. Unremarkable thyroid gland. UPPER CHEST: No pneumothorax or pleural effusion. No nodules or masses. AORTIC ARCH: There is mild calcific atherosclerosis of the aortic arch. There is no aneurysm, dissection or hemodynamically significant stenosis of the visualized portion of the aorta. Conventional 3 vessel aortic branching pattern. The visualized proximal subclavian arteries are widely patent. RIGHT CAROTID SYSTEM: No dissection, occlusion or aneurysm. Mild atherosclerotic  calcification at the carotid bifurcation without hemodynamically significant stenosis. LEFT CAROTID SYSTEM: No dissection, occlusion or aneurysm. Mild atherosclerotic calcification at the carotid bifurcation without hemodynamically significant stenosis. VERTEBRAL ARTERIES: Left dominant configuration. Both origins are clearly patent. There is no dissection, occlusion or flow-limiting stenosis to the skull base (V1-V3 segments). CTA HEAD FINDINGS POSTERIOR CIRCULATION: --Vertebral arteries: Normal V4 segments. --Inferior cerebellar arteries: Normal. --Basilar artery: Normal. --Superior cerebellar arteries: Normal. --Posterior cerebral arteries (PCA): Normal. Small right P-comm. No left P-comm visualized. ANTERIOR CIRCULATION: --Intracranial internal carotid arteries: Atherosclerotic calcification of the internal carotid arteries at the skull base without hemodynamically significant stenosis. --Anterior cerebral arteries (ACA): Normal. Hypoplastic right A1 segment, normal variant. --Middle cerebral arteries (MCA): Normal. VENOUS SINUSES: As permitted by contrast timing, patent. ANATOMIC VARIANTS: None Review of the MIP images confirms the above findings. IMPRESSION: 1. No emergent large vessel occlusion or hemodynamically significant stenosis of the head or neck. 2. Aortic Atherosclerosis (ICD10-I70.0). Electronically Signed   By: Ulyses Jarred M.D.   On: 04/18/2020 03:40   CT HEAD WO CONTRAST  Result Date: 04/17/2020 CLINICAL DATA:  85 year old male with dizziness. EXAM: CT HEAD WITHOUT CONTRAST TECHNIQUE: Contiguous axial images were obtained from the base of the skull through the vertex without intravenous contrast. COMPARISON:  Head CT dated 01/15/2009. FINDINGS: Brain: Mild age-related atrophy and chronic microvascular ischemic changes. There is no acute intracranial hemorrhage. No mass effect or midline shift. No extra-axial fluid collection. Vascular: No hyperdense vessel or unexpected calcification. Skull:  Normal. Negative for fracture or focal lesion. Sinuses/Orbits: There is diffuse mucoperiosteal thickening of paranasal sinuses. No air-fluid level. The mastoid air cells are clear. Other: None IMPRESSION: 1. No acute intracranial pathology. 2. Mild age-related atrophy and chronic microvascular ischemic changes. Electronically Signed   By: Anner Crete M.D.   On: 04/17/2020 19:10   CT Angio Neck W and/or Wo Contrast  Result Date: 04/18/2020 CLINICAL DATA:  Transient ischemic attack EXAM: CT ANGIOGRAPHY HEAD AND NECK TECHNIQUE: Multidetector CT imaging of the head and neck was performed using the standard protocol during bolus administration of intravenous contrast. Multiplanar CT image reconstructions and MIPs were obtained to evaluate the vascular anatomy. Carotid stenosis measurements (when applicable) are obtained utilizing NASCET criteria, using the distal internal carotid diameter as the denominator. CONTRAST:  26mL OMNIPAQUE IOHEXOL 350 MG/ML SOLN COMPARISON:  None. FINDINGS: CTA NECK FINDINGS SKELETON: There is no bony spinal canal stenosis. No lytic or blastic lesion. OTHER NECK: Normal pharynx, larynx and major salivary glands. No cervical lymphadenopathy. Unremarkable thyroid gland. UPPER CHEST: No pneumothorax or pleural effusion. No nodules or masses. AORTIC ARCH: There is mild calcific atherosclerosis of the aortic arch.  There is no aneurysm, dissection or hemodynamically significant stenosis of the visualized portion of the aorta. Conventional 3 vessel aortic branching pattern. The visualized proximal subclavian arteries are widely patent. RIGHT CAROTID SYSTEM: No dissection, occlusion or aneurysm. Mild atherosclerotic calcification at the carotid bifurcation without hemodynamically significant stenosis. LEFT CAROTID SYSTEM: No dissection, occlusion or aneurysm. Mild atherosclerotic calcification at the carotid bifurcation without hemodynamically significant stenosis. VERTEBRAL ARTERIES: Left  dominant configuration. Both origins are clearly patent. There is no dissection, occlusion or flow-limiting stenosis to the skull base (V1-V3 segments). CTA HEAD FINDINGS POSTERIOR CIRCULATION: --Vertebral arteries: Normal V4 segments. --Inferior cerebellar arteries: Normal. --Basilar artery: Normal. --Superior cerebellar arteries: Normal. --Posterior cerebral arteries (PCA): Normal. Small right P-comm. No left P-comm visualized. ANTERIOR CIRCULATION: --Intracranial internal carotid arteries: Atherosclerotic calcification of the internal carotid arteries at the skull base without hemodynamically significant stenosis. --Anterior cerebral arteries (ACA): Normal. Hypoplastic right A1 segment, normal variant. --Middle cerebral arteries (MCA): Normal. VENOUS SINUSES: As permitted by contrast timing, patent. ANATOMIC VARIANTS: None Review of the MIP images confirms the above findings. IMPRESSION: 1. No emergent large vessel occlusion or hemodynamically significant stenosis of the head or neck. 2. Aortic Atherosclerosis (ICD10-I70.0). Electronically Signed   By: Ulyses Jarred M.D.   On: 04/18/2020 03:40   MR BRAIN WO CONTRAST  Result Date: 04/18/2020 CLINICAL DATA:  Dizziness EXAM: MRI HEAD WITHOUT CONTRAST TECHNIQUE: Multiplanar, multiecho pulse sequences of the brain and surrounding structures were obtained without intravenous contrast. COMPARISON:  CTA from yesterday FINDINGS: Brain: No acute infarction, hemorrhage, hydrocephalus, extra-axial collection or mass lesion. Mild for age cerebral volume loss and chronic small vessel ischemic change. Vascular: Normal flow voids Skull and upper cervical spine: Normal marrow signal Sinuses/Orbits: Generalized mucosal thickening in paranasal sinuses. Bilateral cataract resection. Other: Unremarkable appearance of the temporal bones IMPRESSION: 1. Aging brain without acute or reversible finding. 2. Generalized sinusitis. Electronically Signed   By: Monte Fantasia M.D.   On:  04/18/2020 07:39   EEG adult  Result Date: 04/18/2020 Alexis Goodell, MD     04/18/2020  1:18 PM ELECTROENCEPHALOGRAM REPORT Patient: Bryan Armstrong       Room #: 039C EEG No. ID: [id number] Age: 84 y.o.        Sex: male Requesting Physician: Dorathy Stallone Report Date:  04/18/2020       Interpreting Physician: Alexis Goodell History: SAMIER JACO is an 85 y.o. male with an episode of confusion Medications: ASA, Plavix, Dulera, Crestor Conditions of Recording:  This is a 21 channel routine scalp EEG performed with bipolar and monopolar montages arranged in accordance to the international 10/20 system of electrode placement. One channel was dedicated to EKG recording. The patient is in the awake and drowsy states. Description:  The waking background activity consists of a low voltage, symmetrical, fairly well organized, 9 Hz alpha activity, seen from the parieto-occipital and posterior temporal regions.  Low voltage fast activity, poorly organized, is seen anteriorly and is at times superimposed on more posterior regions.  A mixture of theta and alpha rhythms are seen from the central and temporal regions. The patient drowses with slowing to irregular, low voltage theta and beta activity.  Stage II sleep is not obtained. No epileptiform activity is noted.  Hyperventilation was not performed.  Intermittent photic stimulation was performed but failed to illicit any change in the tracing.  (and elicits a symmetrical driving response but fails to elicit any abnormalities. IMPRESSION: Normal electroencephalogram, awake, drowsy and with activation procedures. There are no  focal lateralizing or epileptiform features. Alexis Goodell, MD Neurology [phone] 04/18/2020, 1:16 PM   VAS [place] ABI WITH/WO TBI  Result Date: 04/05/2020 LOWER EXTREMITY DOPPLER STUDY Indications: Peripheral artery disease.  Vascular Interventions: 04-03-2020 Rt Femoral Endarterectomy. Comparison Study: Prior study 02-22-2020 Performing Technologist:  Darlin Coco  Examination Guidelines: A complete evaluation includes at minimum, Doppler waveform signals and systolic blood pressure reading at the level of bilateral brachial, anterior tibial, and posterior tibial arteries, when vessel segments are accessible. Bilateral testing is considered an integral part of a complete examination. Photoelectric Plethysmograph (PPG) waveforms and toe systolic pressure readings are included as required and additional duplex testing as needed. Limited examinations for reoccurring indications may be performed as noted.  ABI Findings: +---------+------------------+-----+---------+--------+ Right    Rt Pressure (mmHg)IndexWaveform Comment  +---------+------------------+-----+---------+--------+ Brachial 134                    triphasic         +---------+------------------+-----+---------+--------+ PTA      115               0.85 triphasic         +---------+------------------+-----+---------+--------+ DP       120               0.89 triphasic         +---------+------------------+-----+---------+--------+ Great Toe73                0.54 Abnormal          +---------+------------------+-----+---------+--------+ +---------+------------------+-----+----------+-------+ Left     Lt Pressure (mmHg)IndexWaveform  Comment +---------+------------------+-----+----------+-------+ Brachial 135                    triphasic         +---------+------------------+-----+----------+-------+ PTA      67                0.50 monophasic        +---------+------------------+-----+----------+-------+ DP       85                0.63 biphasic          +---------+------------------+-----+----------+-------+ Great Toe53                0.39 Abnormal          +---------+------------------+-----+----------+-------+ +-------+-----------+-----------+------------+------------+ ABI/TBIToday's ABIToday's TBIPrevious ABIPrevious TBI  +-------+-----------+-----------+------------+------------+ Right  .89        .54        .79         .41          +-------+-----------+-----------+------------+------------+ Left   .63        .39        .59         .39          +-------+-----------+-----------+------------+------------+ Right ABIs appear increased compared to prior study on 02-22-2020. Left ABIs appear essentially unchanged compared to prior study on 02-22-2020.  Summary: Right: Resting right ankle-brachial index indicates mild right lower extremity arterial disease. The right toe-brachial index is abnormal. Left: Resting left ankle-brachial index indicates moderate left lower extremity arterial disease. The left toe-brachial index is abnormal.  *See table(s) above for measurements and observations.  Electronically signed by Servando Snare MD on 04/05/2020 at 9:02:47 PM.    Final    MYOCARDIAL PERFUSION IMAGING  Result Date: 03/21/2020  Nuclear stress EF: 59%.  There was no ST segment deviation noted during stress.  The study is normal.  This is a low risk study.  The left ventricular ejection fraction is normal (55-65%).    ECHOCARDIOGRAM COMPLETE  Result Date: 04/18/2020    ECHOCARDIOGRAM REPORT   Patient Name:   JACARIUS HANDEL Date of Exam: 04/18/2020 Medical Rec #:  [id number]       Height:       71.0 in Accession #:    [id number]      Weight:       205.0 lb Date of Birth:  07-Oct-1935       BSA:          2.131 m Patient Age:    20 years        BP:           171/77 mmHg Patient Gender: M               HR:           57 bpm. Exam Location:  Inpatient Procedure: 2D Echo Indications:    TIA 435.9  History:        Patient has no prior history of Echocardiogram examinations.                 COPD; Risk Factors:Hypertension and Dyslipidemia.  Sonographer:    Johny Chess Referring Phys: [place]  1. Left ventricular ejection fraction, by estimation, is 60 to 65%. The left ventricle has normal function.  The left ventricle has no regional wall motion abnormalities. Left ventricular diastolic parameters were normal.  2. Right ventricular systolic function is normal. The right ventricular size is normal.  3. Left atrial size was mildly dilated.  4. The mitral valve is normal in structure. Trivial mitral valve regurgitation. No evidence of mitral stenosis.  5. The aortic valve is normal in structure. Aortic valve regurgitation is not visualized. Mild aortic valve sclerosis is present, with no evidence of aortic valve stenosis.  6. The inferior vena cava is normal in size with greater than 50% respiratory variability, suggesting right atrial pressure of 3 mmHg. FINDINGS  Left Ventricle: Left ventricular ejection fraction, by estimation, is 60 to 65%. The left ventricle has normal function. The left ventricle has no regional wall motion abnormalities. The left ventricular internal cavity size was normal in size. There is  no left ventricular hypertrophy. Left ventricular diastolic parameters were normal. Right Ventricle: The right ventricular size is normal. No increase in right ventricular wall thickness. Right ventricular systolic function is normal. Left Atrium: Left atrial size was mildly dilated. Right Atrium: Right atrial size was normal in size. Pericardium: There is no evidence of pericardial effusion. Mitral Valve: The mitral valve is normal in structure. Normal mobility of the mitral valve leaflets. Moderate mitral annular calcification. Trivial mitral valve regurgitation. No evidence of mitral valve stenosis. Tricuspid Valve: The tricuspid valve is normal in structure. Tricuspid valve regurgitation is not demonstrated. No evidence of tricuspid stenosis. Aortic Valve: The aortic valve is normal in structure. Aortic valve regurgitation is not visualized. Mild aortic valve sclerosis is present, with no evidence of aortic valve stenosis. Pulmonic Valve: The pulmonic valve was normal in structure. Pulmonic valve  regurgitation is not visualized. No evidence of pulmonic stenosis. Aorta: The aortic root is normal in size and structure. Venous: The inferior vena cava is normal in size with greater than 50% respiratory variability, suggesting right atrial pressure of 3 mmHg. IAS/Shunts: No atrial level shunt detected by color flow Doppler.  LEFT VENTRICLE PLAX 2D LVIDd:  5.10 cm  Diastology LVIDs:         3.30 cm  LV e' lateral:   6.64 cm/s LV PW:         1.00 cm  LV E/e' lateral: 9.0 LV IVS:        0.90 cm  LV e' medial:    5.22 cm/s LVOT diam:     2.00 cm  LV E/e' medial:  11.5 LV SV:         82 LV SV Index:   38 LVOT Area:     3.14 cm  RIGHT VENTRICLE             IVC RV S prime:     14.60 cm/s  IVC diam: 1.30 cm TAPSE (M-mode): 3.8 cm LEFT ATRIUM           Index       RIGHT ATRIUM           Index LA diam:      4.20 cm 1.97 cm/m  RA Area:     16.80 cm LA Vol (A2C): 57.5 ml 26.99 ml/m RA Volume:   41.70 ml  19.57 ml/m  AORTIC VALVE LVOT Vmax:   110.00 cm/s LVOT Vmean:  68.600 cm/s LVOT VTI:    0.261 m  AORTA Ao Root diam: 3.90 cm Ao Asc diam:  3.70 cm MITRAL VALVE MV Area (PHT): 1.86 cm    SHUNTS MV Decel Time: 408 msec    Systemic VTI:  0.26 m MV E velocity: 60.00 cm/s  Systemic Diam: 2.00 cm MV A velocity: 97.30 cm/s MV E/A ratio:  0.62 Jenkins Rouge MD Electronically signed by Jenkins Rouge MD Signature Date/Time: 04/18/2020/4:33:40 PM    Final       Subjective: Patient feeling well.  No headache, confusion, no fever, dizziness, focal weakness, slurred speech.  Discharge Exam: Vitals:   04/18/20 1730 04/18/20 1831  BP: (!) 179/72   Pulse: (!) 58   Resp: 18   Temp:  98.1 F (36.7 C)  SpO2: 97%    Vitals:   04/18/20 1030 04/18/20 1349 04/18/20 1730 04/18/20 1831  BP: (!) 171/77  (!) 179/72   Pulse: (!) 55 (!) 58 (!) 58   Resp: 14  18   Temp:    98.1 F (36.7 C)  TempSrc:    Oral  SpO2: 98% 95% 97%   Weight:      Height:        General: Pt is alert, awake, not in acute  distress Cardiovascular: RRR, nl S1-S2, no murmurs appreciated.   No LE edema.   Respiratory: Normal respiratory rate and rhythm.  CTAB without rales or wheezes. Abdominal: Abdomen soft and non-tender.  No distension or HSM.   Neuro/Psych: Strength symmetric in upper and lower extremities.  Judgment and insight appear normal.   The results of significant diagnostics from this hospitalization (including imaging, microbiology, ancillary and laboratory) are listed below for reference.     Microbiology: Recent Results (from the past 240 hour(s))  SARS Coronavirus 2 by RT PCR (hospital order, performed in [facility] hospital lab) Nasopharyngeal Nasopharyngeal Swab     Status: None   Collection Time: 04/18/20  2:53 AM   Specimen: Nasopharyngeal Swab  Result Value Ref Range Status   SARS Coronavirus 2 NEGATIVE NEGATIVE Final    Comment: (NOTE) SARS-CoV-2 target nucleic acids are NOT DETECTED.  The SARS-CoV-2 RNA is generally detectable in upper and lower respiratory specimens during the acute phase of  infection. The lowest concentration of SARS-CoV-2 viral copies this assay can detect is 250 copies / mL. A negative result does not preclude SARS-CoV-2 infection and should not be used as the sole basis for treatment or other patient management decisions.  A negative result may occur with improper specimen collection / handling, submission of specimen other than nasopharyngeal swab, presence of viral mutation(s) within the areas targeted by this assay, and inadequate number of viral copies (<250 copies / mL). A negative result must be combined with clinical observations, patient history, and epidemiological information.  Fact Sheet for Patients:   StrictlyIdeas.no  Fact Sheet for Healthcare Providers: BankingDealers.co.za  This test is not yet approved or  cleared by the [place] FDA and has been authorized for detection and/or  diagnosis of SARS-CoV-2 by FDA under an Emergency Use Authorization (EUA).  This EUA will remain in effect (meaning this test can be used) for the duration of the COVID-19 declaration under Section 564(b)(1) of the Act, 21 U.S.C. section 360bbb-3(b)(1), unless the authorization is terminated or revoked sooner.  Performed at [facility], [place] [address], [place], [place] [address]      Labs: BNP (last 3 results) No results for input(s): BNP in the last 8760 hours. Basic Metabolic Panel: Recent Labs  Lab 04/17/20 1801 04/17/20 1851 04/18/20 0528  NA 140 141  --   K 4.3 4.0  --   CL 106 103  --   CO2 25  --   --   GLUCOSE 117* 113*  --   BUN 20 24*  --   CREATININE 1.00 1.00 0.93  CALCIUM 8.6*  --   --    Liver Function Tests: Recent Labs  Lab 04/17/20 1801  AST 20  ALT 24  ALKPHOS 64  BILITOT 0.6  PROT 6.3*  ALBUMIN 3.6   No results for input(s): LIPASE, AMYLASE in the last 168 hours. No results for input(s): AMMONIA in the last 168 hours. CBC: Recent Labs  Lab 04/17/20 1801 04/17/20 1851 04/18/20 0528  WBC 9.4  --  9.4  NEUTROABS 6.9  --   --   HGB 12.7* 13.6 13.1  HCT 41.0 40.0 41.0  MCV 94.5  --  93.4  PLT 223  --  222   Cardiac Enzymes: No results for input(s): CKTOTAL, CKMB, CKMBINDEX, TROPONINI in the last 168 hours. BNP: Invalid input(s): POCBNP CBG: No results for input(s): GLUCAP in the last 168 hours. D-Dimer No results for input(s): DDIMER in the last 72 hours. Hgb A1c Recent Labs    04/18/20 0528  HGBA1C 5.7*   Lipid Profile Recent Labs    04/18/20 0528  CHOL 107  HDL 38*  LDLCALC 57  TRIG 58  CHOLHDL 2.8   Thyroid function studies No results for input(s): TSH, T4TOTAL, T3FREE, THYROIDAB in the last 72 hours.  Invalid input(s): FREET3 Anemia work up No results for input(s): VITAMINB12, FOLATE, FERRITIN, TIBC, IRON, RETICCTPCT in the last 72 hours. Urinalysis    Component Value Date/Time   COLORURINE YELLOW  03/27/2020 1334   APPEARANCEUR CLEAR 03/27/2020 1334   LABSPEC 1.020 03/27/2020 1334   PHURINE 5.0 03/27/2020 1334   GLUCOSEU NEGATIVE 03/27/2020 1334   GLUCOSEU NEGATIVE 12/15/2017 1458   HGBUR NEGATIVE 03/27/2020 1334   BILIRUBINUR NEGATIVE 03/27/2020 1334   KETONESUR NEGATIVE 03/27/2020 1334   PROTEINUR NEGATIVE 03/27/2020 1334   UROBILINOGEN 0.2 12/15/2017 1458   NITRITE NEGATIVE 03/27/2020 1334   LEUKOCYTESUR NEGATIVE 03/27/2020 1334   Sepsis  Labs Invalid input(s): PROCALCITONIN,  WBC,  LACTICIDVEN Microbiology Recent Results (from the past 240 hour(s))  SARS Coronavirus 2 by RT PCR (hospital order, performed in [facility] hospital lab) Nasopharyngeal Nasopharyngeal Swab     Status: None   Collection Time: 04/18/20  2:53 AM   Specimen: Nasopharyngeal Swab  Result Value Ref Range Status   SARS Coronavirus 2 NEGATIVE NEGATIVE Final    Comment: (NOTE) SARS-CoV-2 target nucleic acids are NOT DETECTED.  The SARS-CoV-2 RNA is generally detectable in upper and lower respiratory specimens during the acute phase of infection. The lowest concentration of SARS-CoV-2 viral copies this assay can detect is 250 copies / mL. A negative result does not preclude SARS-CoV-2 infection and should not be used as the sole basis for treatment or other patient management decisions.  A negative result may occur with improper specimen collection / handling, submission of specimen other than nasopharyngeal swab, presence of viral mutation(s) within the areas targeted by this assay, and inadequate number of viral copies (<250 copies / mL). A negative result must be combined with clinical observations, patient history, and epidemiological information.  Fact Sheet for Patients:   StrictlyIdeas.no  Fact Sheet for Healthcare Providers: BankingDealers.co.za  This test is not yet approved or  cleared by the [place] FDA and has been authorized for  detection and/or diagnosis of SARS-CoV-2 by FDA under an Emergency Use Authorization (EUA).  This EUA will remain in effect (meaning this test can be used) for the duration of the COVID-19 declaration under Section 564(b)(1) of the Act, 21 U.S.C. section 360bbb-3(b)(1), unless the authorization is terminated or revoked sooner.  Performed at [facility], [place] [address] [place], [place] [address]      Time coordinating discharge: 45 minutes      SIGNED:   Edwin Dada, MD  [facility] 04/18/2020, 8:46 PM

## 2020-04-18 NOTE — ED Provider Notes (Signed)
[facility] EMERGENCY DEPARTMENT Provider Note   CSN: [id number] Arrival date & time: 04/17/20  1655     History Chief Complaint  Patient presents with  . Dizziness    Bryan Armstrong is a 85 y.o. male with a history of COPD, hyperlipidemia, HTN, PAD with bilateral lower extremity claudication, bradycardia who presents to the emergency department with a chief complaint of   The patient reports that at approximately 14:00-14:30 he was standing in a doorway talking to a family member when he suddenly began leaning to his right and felt off balance. States that he was almost "falling forward", but lost control of both of his legs during the episode. States "I felt like I was running, but couldn't control them." He daughter reports that during the episode his face was a blank stare and he didn't seem to respond and wasn't able to talk. Reports that she caught him as he was falling to the right, but only after his right shoulder hit the corner of the building, and she was able to sit him down in the chair. Over the next couple of minutes, the patient became able to speak, but did not seem to recall the episode, per his daughter. The patient slow returned to his baseline within 5 minutes, but continued to endorse lightheadedness and "feeling off" for an unknown amount of time after this episode, but reports this has since resolved.  He did not lose consciousness.  He denies chest pain, shortness of breath, dizziness, tinnitus, headache, neck pain or stiffness, numbness, weakness, urinary or fecal incontinence, slurred speech, facial droop, abdominal pain, nausea, vomiting, diarrhea, diaphoresis, diplopia, blurred vision, amaurosis fugax, or seizure-like activity during the episode.  He also notes that a couple of nights ago that he was watching television when he noticed that he suddenly couldn't see out of the bottom half of his fiend of vision in his right eye. He blinked several  times, but continued to note the same change in his vision until the episode spontaneously resolved within a minute.  No changes in his left visual field.  Otherwise, his vision has remained at baseline.   He underwent right femoral endarterectomy on 6/22 with Dr. Oneida Alar.  He underwent a cardiac stress test with Dr. Gwenlyn Found prior to the procedure. He is followed by Dr. Jerline Pain for hypertension and was previously taking triamterene-HCTZ as needed, but had several low blood pressure readings 90s/50s and was started on losartan. He is on a daily baby aspirin.   Reports that he had a syncopal episode more than 10 years ago where he was diagnosed with vasovagal syncope.  He also reports that he had one episode of vertigo many years ago, but states that his symptoms today felt very different.  The history is provided by the patient. No language interpreter was used.       Past Medical History:  Diagnosis Date  . BRADYCARDIA 06/01/2007  . Cataract    bil cataracts removed  . COPD (chronic obstructive pulmonary disease) ([place])   . EMPHYSEMA 03/28/2008  . Emphysema of lung ([employer])   . GERD (gastroesophageal reflux disease)   . HYPERGLYCEMIA 06/01/2007  . HYPERLIPIDEMIA 06/01/2007  . Hypertension   . HYPERTENSION 01/19/2009  . Peripheral arterial disease [facility])     Patient Active Problem List   Diagnosis Date Noted  . TIA (transient ischemic attack) 04/18/2020  . PAD (peripheral artery disease) ([place]) 04/03/2020  . Neck pain 12/30/2018  . GERD (gastroesophageal reflux disease)  09/28/2018  . Peripheral arterial disease ([place]) 06/15/2018  . Former smoker 06/15/2018  . History of colonic polyps 01/07/2018  . Nonspecific abnormal finding in stool contents 01/07/2018  . Allergic rhinitis 05/31/2013  . Urticaria 11/02/2011  . HTN (hypertension) 01/19/2009  . COPD, group A, by GOLD 2017 classification ([place]) 03/28/2008  . Dyslipidemia 06/01/2007  . Bradycardia 06/01/2007  . HYPERGLYCEMIA 06/01/2007     Past Surgical History:  Procedure Laterality Date  . ABDOMINAL AORTOGRAM W/LOWER EXTREMITY N/A 03/05/2020   Procedure: ABDOMINAL AORTOGRAM W/  LOWER EXTREMITY;  Surgeon: Lorretta Harp, MD;  Location: [facility];  Service: Cardiovascular;  Laterality: N/A;  . CATARACT EXTRACTION, BILATERAL    . COLONOSCOPY    . ENDARTERECTOMY FEMORAL Right 04/03/2020   Procedure: Right Femoral Endarterectomy;  Surgeon: Elam Dutch, MD;  Location: [facility] OR;  Service: Vascular;  Laterality: Right;  . EYE SURGERY    . POLYPECTOMY    . TONSILLECTOMY AND ADENOIDECTOMY         Family History  Problem Relation Age of Onset  . Cancer Mother        "Male" cancer  . Uterine cancer Mother   . Heart disease Father   . Colon cancer Neg Hx   . Esophageal cancer Neg Hx   . Rectal cancer Neg Hx   . Stomach cancer Neg Hx   . Liver cancer Neg Hx   . Pancreatic cancer Neg Hx   . Prostate cancer Neg Hx     Social History   Tobacco Use  . Smoking status: Former Smoker    Packs/day: 0.50    Years: 40.00    Pack years: 20.00    Types: Cigarettes    Quit date: 09/26/2017    Years since quitting: 2.5  . Smokeless tobacco: Never Used  . Tobacco comment: stopped 2018  Vaping Use  . Vaping Use: Never used  Substance Use Topics  . Alcohol use: No  . Drug use: No    Home Medications Prior to Admission medications   Medication Sig Start Date End Date Taking? Authorizing Provider  acetaminophen (TYLENOL) 325 MG tablet Take 325-650 mg by mouth every 6 (six) hours as needed for mild pain.   Yes [provider]  aspirin 81 MG tablet Take 81 mg by mouth daily.    Yes [provider]  EPINEPHrine (EPIPEN 2-PAK) 0.3 mg/0.3 mL IJ SOAJ injection Inject 0.3 mLs (0.3 mg total) into the muscle as needed for anaphylaxis. 05/13/19  Yes Vivi Barrack, MD  fluticasone [facility]) 50 MCG/ACT nasal spray Place 2 sprays into both nostrils daily as needed for allergies or rhinitis.   Yes  [provider]  Fluticasone-Salmeterol (ADVAIR DISKUS) 250-50 MCG/DOSE AEPB inhale 1 dose by mouth twice a day Patient taking differently: Inhale 1 puff into the lungs daily.  09/30/19  Yes Mannam, Praveen, MD  Multiple Vitamins-Minerals (PRESERVISION AREDS 2 PO) Take 1 capsule by mouth daily.   Yes [provider]  oxyCODONE-acetaminophen (PERCOCET) 5-325 MG tablet Take 1 tablet by mouth every 6 (six) hours as needed. Patient taking differently: Take 1 tablet by mouth every 6 (six) hours as needed for moderate pain.  04/07/20  Yes Rhyne, Samantha J, PA-C  rosuvastatin (CRESTOR) 20 MG tablet TAKE 1 TABLET(20 MG) BY MOUTH DAILY AT 6 PM Patient taking differently: Take 20 mg by mouth daily.  04/04/20  Yes Waynetta Sandy, MD  triamterene-hydrochlorothiazide (MAXZIDE-25) 37.5-25 MG tablet Take 0.5 tablets by mouth  daily. 01/18/19  Yes Vivi Barrack, MD    Allergies    Bee venom  Review of Systems   Review of Systems  Constitutional: Negative for appetite change, chills and fever.  HENT: Negative for congestion, sore throat and tinnitus.   Eyes: Positive for visual disturbance. Negative for pain and redness.  Respiratory: Negative for shortness of breath and wheezing.   Cardiovascular: Negative for chest pain and palpitations.  Gastrointestinal: Negative for abdominal pain, diarrhea, nausea and vomiting.  Genitourinary: Negative for dysuria.  Musculoskeletal: Negative for back pain, myalgias, neck pain and neck stiffness.  Skin: Negative for rash.  Allergic/Immunologic: Negative for immunocompromised state.  Neurological: Positive for speech difficulty and light-headedness. Negative for tremors, seizures, weakness, numbness and headaches.       Aphasia  Psychiatric/Behavioral: Negative for confusion.    Physical Exam Updated Vital Signs BP 131/73 (BP Location: Left Arm)   Pulse 61   Temp 98.2 F (36.8 C) (Oral)   Resp 18   Ht 5\' 11"  (1.803 m)   Wt 93 kg    SpO2 96%   BMI 28.59 kg/m   Physical Exam Vitals and nursing note reviewed.  Constitutional:      General: He is not in acute distress.    Appearance: He is well-developed. He is not ill-appearing, toxic-appearing or diaphoretic.  HENT:     Head: Normocephalic.     Mouth/Throat:     Mouth: Mucous membranes are moist.  Eyes:     Extraocular Movements: Extraocular movements intact.     Conjunctiva/sclera: Conjunctivae normal.     Pupils: Pupils are equal, round, and reactive to light.     Visual Fields: Right eye visual fields normal and left eye visual fields normal.  Cardiovascular:     Rate and Rhythm: Normal rate and regular rhythm.     Heart sounds: No murmur heard.   Pulmonary:     Effort: Pulmonary effort is normal. No respiratory distress.     Breath sounds: No stridor. No wheezing, rhonchi or rales.  Chest:     Chest wall: No tenderness.  Abdominal:     General: There is no distension.     Palpations: Abdomen is soft. There is no mass.     Tenderness: There is no abdominal tenderness. There is no right CVA tenderness, left CVA tenderness, guarding or rebound.     Hernia: No hernia is present.  Musculoskeletal:        General: No tenderness.     Cervical back: Neck supple.     Right lower leg: No edema.     Left lower leg: No edema.  Skin:    General: Skin is warm and dry.  Neurological:     General: No focal deficit present.     Mental Status: He is alert.     Comments: GCS 15.  Moves all 4 extremities spontaneously.  Alert and oriented x4.  5/5 strength against resistance of the bilateral upper and lower extremities.  Sensation is intact and equal throughout.  Cranial nerves II through XII are grossly intact.  Finger-to-nose is intact bilaterally without dysmetria.  No pronator drift.  No clonus bilaterally.  Psychiatric:        Behavior: Behavior normal.     ED Results / Procedures / Treatments   Labs (all labs ordered are listed, but only abnormal results  are displayed) Labs Reviewed  CBC - Abnormal; Notable for the following components:      Result Value  Hemoglobin 12.7 (*)    All other components within normal limits  COMPREHENSIVE METABOLIC PANEL - Abnormal; Notable for the following components:   Glucose, Bld 117 (*)    Calcium 8.6 (*)    Total Protein 6.3 (*)    All other components within normal limits  HEMOGLOBIN A1C - Abnormal; Notable for the following components:   Hgb A1c MFr Bld 5.7 (*)    All other components within normal limits  LIPID PANEL - Abnormal; Notable for the following components:   HDL 38 (*)    All other components within normal limits  I-STAT CHEM 8, ED - Abnormal; Notable for the following components:   BUN 24 (*)    Glucose, Bld 113 (*)    Calcium, Ion 1.09 (*)    All other components within normal limits  SARS CORONAVIRUS 2 BY RT PCR (HOSPITAL ORDER, [facility])  PROTIME-INR  APTT  DIFFERENTIAL  CBC  CREATININE, SERUM    EKG EKG Interpretation  Date/Time:  Tuesday April 17 2020 17:29:17 EDT Ventricular Rate:  63 PR Interval:  174 QRS Duration: 146 QT Interval:  430 QTC Calculation: 440 R Axis:   -22 Text Interpretation: Normal sinus rhythm with sinus arrhythmia Right bundle branch block Minimal voltage criteria for LVH, may be normal variant ( R in aVL ) T wave abnormality, consider lateral ischemia Abnormal ECG Confirmed by Thayer Jew [phone]) on 04/18/2020 1:14:03 AM   Radiology CT Angio Head W or Wo Contrast  Result Date: 04/18/2020 CLINICAL DATA:  Transient ischemic attack EXAM: CT ANGIOGRAPHY HEAD AND NECK TECHNIQUE: Multidetector CT imaging of the head and neck was performed using the standard protocol during bolus administration of intravenous contrast. Multiplanar CT image reconstructions and MIPs were obtained to evaluate the vascular anatomy. Carotid stenosis measurements (when applicable) are obtained utilizing NASCET criteria, using the distal  internal carotid diameter as the denominator. CONTRAST:  60mL OMNIPAQUE IOHEXOL 350 MG/ML SOLN COMPARISON:  None. FINDINGS: CTA NECK FINDINGS SKELETON: There is no bony spinal canal stenosis. No lytic or blastic lesion. OTHER NECK: Normal pharynx, larynx and major salivary glands. No cervical lymphadenopathy. Unremarkable thyroid gland. UPPER CHEST: No pneumothorax or pleural effusion. No nodules or masses. AORTIC ARCH: There is mild calcific atherosclerosis of the aortic arch. There is no aneurysm, dissection or hemodynamically significant stenosis of the visualized portion of the aorta. Conventional 3 vessel aortic branching pattern. The visualized proximal subclavian arteries are widely patent. RIGHT CAROTID SYSTEM: No dissection, occlusion or aneurysm. Mild atherosclerotic calcification at the carotid bifurcation without hemodynamically significant stenosis. LEFT CAROTID SYSTEM: No dissection, occlusion or aneurysm. Mild atherosclerotic calcification at the carotid bifurcation without hemodynamically significant stenosis. VERTEBRAL ARTERIES: Left dominant configuration. Both origins are clearly patent. There is no dissection, occlusion or flow-limiting stenosis to the skull base (V1-V3 segments). CTA HEAD FINDINGS POSTERIOR CIRCULATION: --Vertebral arteries: Normal V4 segments. --Inferior cerebellar arteries: Normal. --Basilar artery: Normal. --Superior cerebellar arteries: Normal. --Posterior cerebral arteries (PCA): Normal. Small right P-comm. No left P-comm visualized. ANTERIOR CIRCULATION: --Intracranial internal carotid arteries: Atherosclerotic calcification of the internal carotid arteries at the skull base without hemodynamically significant stenosis. --Anterior cerebral arteries (ACA): Normal. Hypoplastic right A1 segment, normal variant. --Middle cerebral arteries (MCA): Normal. VENOUS SINUSES: As permitted by contrast timing, patent. ANATOMIC VARIANTS: None Review of the MIP images confirms the above  findings. IMPRESSION: 1. No emergent large vessel occlusion or hemodynamically significant stenosis of the head or neck. 2. Aortic Atherosclerosis (ICD10-I70.0). Electronically Signed  By: Ulyses Jarred M.D.   On: 04/18/2020 03:40   CT HEAD WO CONTRAST  Result Date: 04/17/2020 CLINICAL DATA:  85 year old male with dizziness. EXAM: CT HEAD WITHOUT CONTRAST TECHNIQUE: Contiguous axial images were obtained from the base of the skull through the vertex without intravenous contrast. COMPARISON:  Head CT dated 01/15/2009. FINDINGS: Brain: Mild age-related atrophy and chronic microvascular ischemic changes. There is no acute intracranial hemorrhage. No mass effect or midline shift. No extra-axial fluid collection. Vascular: No hyperdense vessel or unexpected calcification. Skull: Normal. Negative for fracture or focal lesion. Sinuses/Orbits: There is diffuse mucoperiosteal thickening of paranasal sinuses. No air-fluid level. The mastoid air cells are clear. Other: None IMPRESSION: 1. No acute intracranial pathology. 2. Mild age-related atrophy and chronic microvascular ischemic changes. Electronically Signed   By: Anner Crete M.D.   On: 04/17/2020 19:10   CT Angio Neck W and/or Wo Contrast  Result Date: 04/18/2020 CLINICAL DATA:  Transient ischemic attack EXAM: CT ANGIOGRAPHY HEAD AND NECK TECHNIQUE: Multidetector CT imaging of the head and neck was performed using the standard protocol during bolus administration of intravenous contrast. Multiplanar CT image reconstructions and MIPs were obtained to evaluate the vascular anatomy. Carotid stenosis measurements (when applicable) are obtained utilizing NASCET criteria, using the distal internal carotid diameter as the denominator. CONTRAST:  28mL OMNIPAQUE IOHEXOL 350 MG/ML SOLN COMPARISON:  None. FINDINGS: CTA NECK FINDINGS SKELETON: There is no bony spinal canal stenosis. No lytic or blastic lesion. OTHER NECK: Normal pharynx, larynx and major salivary glands.  No cervical lymphadenopathy. Unremarkable thyroid gland. UPPER CHEST: No pneumothorax or pleural effusion. No nodules or masses. AORTIC ARCH: There is mild calcific atherosclerosis of the aortic arch. There is no aneurysm, dissection or hemodynamically significant stenosis of the visualized portion of the aorta. Conventional 3 vessel aortic branching pattern. The visualized proximal subclavian arteries are widely patent. RIGHT CAROTID SYSTEM: No dissection, occlusion or aneurysm. Mild atherosclerotic calcification at the carotid bifurcation without hemodynamically significant stenosis. LEFT CAROTID SYSTEM: No dissection, occlusion or aneurysm. Mild atherosclerotic calcification at the carotid bifurcation without hemodynamically significant stenosis. VERTEBRAL ARTERIES: Left dominant configuration. Both origins are clearly patent. There is no dissection, occlusion or flow-limiting stenosis to the skull base (V1-V3 segments). CTA HEAD FINDINGS POSTERIOR CIRCULATION: --Vertebral arteries: Normal V4 segments. --Inferior cerebellar arteries: Normal. --Basilar artery: Normal. --Superior cerebellar arteries: Normal. --Posterior cerebral arteries (PCA): Normal. Small right P-comm. No left P-comm visualized. ANTERIOR CIRCULATION: --Intracranial internal carotid arteries: Atherosclerotic calcification of the internal carotid arteries at the skull base without hemodynamically significant stenosis. --Anterior cerebral arteries (ACA): Normal. Hypoplastic right A1 segment, normal variant. --Middle cerebral arteries (MCA): Normal. VENOUS SINUSES: As permitted by contrast timing, patent. ANATOMIC VARIANTS: None Review of the MIP images confirms the above findings. IMPRESSION: 1. No emergent large vessel occlusion or hemodynamically significant stenosis of the head or neck. 2. Aortic Atherosclerosis (ICD10-I70.0). Electronically Signed   By: Ulyses Jarred M.D.   On: 04/18/2020 03:40    Procedures Procedures (including critical  care time)  Medications Ordered in ED Medications  aspirin EC tablet 81 mg (has no administration in time range)  oxyCODONE-acetaminophen (PERCOCET/ROXICET) 5-325 MG per tablet 1 tablet (has no administration in time range)  rosuvastatin (CRESTOR) tablet 20 mg (has no administration in time range)  mometasone-formoterol (DULERA) 200-5 MCG/ACT inhaler 2 puff (has no administration in time range)   stroke: mapping our early stages of recovery book (has no administration in time range)  0.9 %  sodium chloride infusion (has no  administration in time range)  acetaminophen (TYLENOL) tablet 650 mg (has no administration in time range)    Or  acetaminophen (TYLENOL) 160 MG/5ML solution 650 mg (has no administration in time range)    Or  acetaminophen (TYLENOL) suppository 650 mg (has no administration in time range)  enoxaparin (LOVENOX) injection 40 mg (has no administration in time range)  hydrALAZINE (APRESOLINE) injection 10 mg (has no administration in time range)  sodium chloride flush (NS) 0.9 % injection 3 mL (3 mLs Intravenous Given 04/18/20 0453)  iohexol (OMNIPAQUE) 350 MG/ML injection 80 mL (80 mLs Intravenous Contrast Given 04/18/20 0324)  LORazepam (ATIVAN) injection 1 mg (1 mg Intravenous Given 04/18/20 0625)  sodium chloride 0.9 % bolus 500 mL (500 mLs Intravenous New Bag/Given 04/18/20 0453)    ED Course  I have reviewed the triage vital signs and the nursing notes.  Pertinent labs & imaging results that were available during my care of the patient were reviewed by me and considered in my medical decision making (see chart for details).    MDM Rules/Calculators/A&P                          85 year old male with a history of COPD, hyperlipidemia, HTN, PAD with bilateral lower extremity claudication, bradycardia presenting after a brief episode of uncontrollable movements of his legs, aphasia, and falling to his right side that occurred ~14:00-14:30.  The episode resolved within 5  minutes, but the patient continued to endorse lightheadedness for some time following the episode.  He is currently at his baseline.  He also notes that he had a brief episode of inferior hemianopsia a couple of nights ago that resolved within a minute.  Vital signs are reassuring.  No neurologic deficits on my physical exam.  The patient does have a history of hypertension related to his antihypertensive medications, but has notably been normotensive in the ER.  However, he does appear to have orthostatic hypotension and a 500 cc fluid bolus have been ordered.  Labs are reassuring.  No electrolyte derangements.  EKG with sinus arrhythmia and right bundle branch block with T wave changes.  However, patient has noted no recent chest pain.  CT head is unremarkable.  Discussed the patient with Dr. Lorraine Lax, neurology, given concern for TIA who recommends medical admission and neurology will follow as a consult.  Differential diagnosis includes posterior circulation TIA, basilar migraine.  Less likely peripheral vertigo given the patient did not have any room spinning dizziness.  He recommends CTA of the head and neck and MR brain without contrast as well as TTE, prolonged heart monitoring for paroxysmal atrial fibrillation, lipid profile, hemoglobin A1c, stroke swallow screen, and frequent neuro checks as well as telemetry monitoring.  Neurology will follow as a consult and recommends medical admission.  Consulted the hospitalist team and Dr. Hal Hope will accept the patient for admission. The patient appears reasonably stabilized for admission considering the current resources, flow, and capabilities available in the ED at this time, and I doubt any other [facility] requiring further screening and/or treatment in the ED prior to admission.   Final Clinical Impression(s) / ED Diagnoses Final diagnoses:  TIA (transient ischemic attack)    Rx / DC Orders ED Discharge Orders    None       Joanne Gavel,  PA-C 04/18/20 [id number]    Merryl Hacker, MD 04/18/20 [phone]

## 2020-04-18 NOTE — ED Notes (Signed)
Patient verbalizes understanding of discharge instructions. Opportunity for questioning and answers were provided. Armband removed by staff, pt discharged from ED to home with daughter

## 2020-04-18 NOTE — Progress Notes (Signed)
EEG Completed; Results Pending  

## 2020-04-18 NOTE — Telephone Encounter (Signed)
Patient in Hospital

## 2020-04-18 NOTE — Progress Notes (Deleted)
Pt is noted to have some unsteadiness with gait, while walking with min guard and occas min assist to steady.  His daughter is in attendance at [facility] session, and is able to observe his safety issues.  Pt has adult children of his wife in his house, and wife in SNF.  Will anticipate he will have 24/7 help as requested, and if not will need to consider a rehab placement.  HHPT to follow up and then outpatient if needed.  04/18/20 1700  PT Visit Information  Last PT Received On 04/18/20  Assistance Needed +1  History of Present Illness 85 yo was brought to ED for weakness, gait changes overnight.  Had negative imaging for brain and neck.   Suspected post circulation TIA.  PMHx:  recent R femoral endarterectomy, bradycardia, COPD, emphysema, GERD, HLD, HTN, PAD,   Precautions  Precautions Fall  Precaution Comments unstable gait  Restrictions  Weight Bearing Restrictions No  Home Living  Family/patient expects to be discharged to: Private residence  Living Arrangements Spouse/significant other  Available Help at Discharge Family;Available 24 hours/day  Type of Home House  Home Access Stairs to enter  Entrance Stairs-Number of Steps 3  Entrance Stairs-Rails Right  Home Layout One level  Bathroom Shower/Tub Walk-in shower  Bathroom Toilet Handicapped height  Bathroom Accessibility Yes  Home Equipment Shower seat  Additional Comments no AD needed, has been recently unsteady  Prior Function  Level of Independence Independent  Comments helps care for wife whom has MS. Wife is currently in SNF but coming home a week after pt is d/c  Communication  Communication No difficulties  Pain Assessment  Pain Assessment No/denies pain  Cognition  Arousal/Alertness Awake/alert  Behavior During Therapy WFL for tasks assessed/performed  Overall Cognitive Status Within Functional Limits for tasks assessed  General Comments has been able to give a history, has been a bit short intially  Upper Extremity  Assessment  Upper Extremity Assessment Overall WFL for tasks assessed  Lower Extremity Assessment  Lower Extremity Assessment Overall WFL for tasks assessed  Cervical / Trunk Assessment  Cervical / Trunk Assessment Normal  Bed Mobility  Overal bed mobility Modified Independent  General bed mobility comments sits up but is awkward  Transfers  Overall transfer level Needs assistance  Equipment used 1 person hand held assist  Transfers Sit to/from Stand  Sit to Stand Min guard  General transfer comment min guard to steady  Ambulation/Gait  Ambulation/Gait assistance Min guard;Min assist  Gait Distance (Feet) 250 Feet  Assistive device 1 person hand held assist  General Gait Details pt has listing to R side mult times with walk including initially, does not see it as an issue  Gait velocity controlled  Gait velocity interpretation <1.31 ft/sec, indicative of household ambulator  Balance  Overall balance assessment Needs assistance  Sitting-balance support Feet supported  Sitting balance-Leahy Scale Fair  Standing balance support No upper extremity supported  Standing balance-Leahy Scale Fair  Standing balance comment fair to fair- dynamic movement  General Comments  General comments (skin integrity, edema, etc.) Pt is walking on the hallway with mult losses of balance, very clear to PT that this is not an issue.  Will need close supervision until this is resolved, with hands on assist if needed  Exercises  Exercises Other exercises (strength in LE's is WFL BLE's)  PT - End of Session  Equipment Utilized During Treatment Gait belt  Activity Tolerance Treatment limited secondary to medical complications (Comment)  Patient left  in bed;with call bell/phone within reach;with family/visitor present  Nurse Communication Mobility status  PT Assessment  PT Recommendation/Assessment Patient needs continued PT services  PT Visit Diagnosis Unsteadiness on feet (R26.81);Difficulty in walking,  not elsewhere classified (R26.2);Dizziness and giddiness (R42)  PT Problem List Decreased range of motion;Decreased activity tolerance;Decreased balance;Decreased mobility;Decreased coordination;Decreased knowledge of use of DME;Cardiopulmonary status limiting activity;Decreased skin integrity  Barriers to Discharge Decreased caregiver support;Inaccessible home environment (has his wife's children with him but wife in SNF)  Barriers to Discharge Comments limitations for home if help is not 24/7  PT Plan  PT Frequency (ACUTE ONLY) Min 3X/week  PT Treatment/Interventions (ACUTE ONLY) DME instruction;Gait training;Stair training;Functional mobility training;Therapeutic activities;Therapeutic exercise;Balance training;Neuromuscular re-education;Patient/family education  AM-PAC PT "6 Clicks" Mobility Outcome Measure (Version 2)  Help needed turning from your back to your side while in a flat bed without using bedrails? 4  Help needed moving from lying on your back to sitting on the side of a flat bed without using bedrails? 4  Help needed moving to and from a bed to a chair (including a wheelchair)? 3  Help needed standing up from a chair using your arms (e.g., wheelchair or bedside chair)? 3  Help needed to walk in hospital room? 3  Help needed climbing 3-5 steps with a railing?  3  6 Click Score 20  Consider Recommendation of Discharge To: Home with no services  PT Recommendation  Follow Up Recommendations Outpatient PT;Supervision for mobility/OOB  PT equipment None recommended by PT  Individuals Consulted  Consulted and Agree with Results and Recommendations Patient;Family member/caregiver  Family Member Consulted daughter who lives 8 minutes from pt  Acute Rehab PT Goals  Patient Stated Goal return home with wife  PT Goal Formulation With patient/family  Time For Goal Achievement 05/02/20  Potential to Achieve Goals Good  PT Time Calculation  PT Start Time (ACUTE ONLY) 1038  PT Stop  Time (ACUTE ONLY) 1108  PT Time Calculation (min) (ACUTE ONLY) 30 min  PT General Charges  $$ ACUTE PT VISIT 1 Visit  PT Evaluation  $PT Eval Moderate Complexity 1 Mod  PT Treatments  $Gait Training 8-22 mins  Written Expression  Dominant Hand Right

## 2020-04-18 NOTE — H&P (Signed)
History and Physical    CYPRESS FANFAN BPZ:[id number] DOB: 1935/05/01 DOA: 04/17/2020  PCP: Vivi Barrack, MD  Patient coming from: Home.  Chief Complaint: Dizziness.  HPI: Bryan Armstrong is a 85 y.o. male with history of peripheral vascular disease status post recent procedure and discharged on June 25 about 2 weeks ago for which patient had a right femur artery dacryon patch with history of hypertension hyperlipidemia COPD previous history of tobacco abuse had gone to visit his daughter yesterday when patient at around 2 PM started feeling dizzy and was standing towards the right side appeared mildly confused.  Patient had to eventually sit but did not fall.  His right shoulder did hit onto the wall.  Patient did not lose consciousness.  The symptoms persisted for at least 2 hours and by the time patient came to the ER and by then the symptoms resolved.  About 3 days ago prior to this patient had partial visual loss in the lower fields of the right eye which resolved within 15 seconds.  ED Course: In the ER patient appeared nonfocal moving all extremities 5 x 5 CT head followed by CT angiogram of the head and neck did not show any large vessel obstruction neurology was consulted patient passed swallow.  Patient admitted for possible TIA.  EKG shows normal sinus rhythm.  Labs show metabolic panel appears to be normal.  CBC shows hemoglobin on 12.7 appears to be baseline.  Covid test was negative.  Review of Systems: As per HPI, rest all negative.   Past Medical History:  Diagnosis Date  . BRADYCARDIA 06/01/2007  . Cataract    bil cataracts removed  . COPD (chronic obstructive pulmonary disease) ([place])   . EMPHYSEMA 03/28/2008  . Emphysema of lung ([place])   . GERD (gastroesophageal reflux disease)   . HYPERGLYCEMIA 06/01/2007  . HYPERLIPIDEMIA 06/01/2007  . Hypertension   . HYPERTENSION 01/19/2009  . Peripheral arterial disease [facility])     Past Surgical History:  Procedure Laterality Date    . ABDOMINAL AORTOGRAM W/LOWER EXTREMITY N/A 03/05/2020   Procedure: ABDOMINAL AORTOGRAM W/  LOWER EXTREMITY;  Surgeon: Lorretta Harp, MD;  Location: [facility];  Service: Cardiovascular;  Laterality: N/A;  . CATARACT EXTRACTION, BILATERAL    . COLONOSCOPY    . ENDARTERECTOMY FEMORAL Right 04/03/2020   Procedure: Right Femoral Endarterectomy;  Surgeon: Elam Dutch, MD;  Location: [facility] OR;  Service: Vascular;  Laterality: Right;  . EYE SURGERY    . POLYPECTOMY    . TONSILLECTOMY AND ADENOIDECTOMY       reports that he quit smoking about 2 years ago. His smoking use included cigarettes. He has a 20.00 pack-year smoking history. He has never used smokeless tobacco. He reports that he does not drink alcohol and does not use drugs.  Allergies  Allergen Reactions  . Bee Venom Anaphylaxis    Yellow jackets    Family History  Problem Relation Age of Onset  . Cancer Mother        "Male" cancer  . Uterine cancer Mother   . Heart disease Father   . Colon cancer Neg Hx   . Esophageal cancer Neg Hx   . Rectal cancer Neg Hx   . Stomach cancer Neg Hx   . Liver cancer Neg Hx   . Pancreatic cancer Neg Hx   . Prostate cancer Neg Hx     Prior to Admission medications   Medication Sig Start Date End  Date Taking? Authorizing Provider  aspirin 81 MG tablet Take 81 mg by mouth daily.    Yes [provider]  EPINEPHrine (EPIPEN 2-PAK) 0.3 mg/0.3 mL IJ SOAJ injection Inject 0.3 mLs (0.3 mg total) into the muscle as needed for anaphylaxis. 05/13/19  Yes Vivi Barrack, MD  Fluticasone-Salmeterol (ADVAIR DISKUS) 250-50 MCG/DOSE AEPB inhale 1 dose by mouth twice a day Patient taking differently: Inhale 1 puff into the lungs daily.  09/30/19  Yes Mannam, Praveen, MD  Multiple Vitamins-Minerals (PRESERVISION AREDS 2 PO) Take 1 capsule by mouth daily.   Yes [provider]  oxyCODONE-acetaminophen (PERCOCET) 5-325 MG tablet Take 1 tablet by mouth every 6 (six) hours as  needed. Patient taking differently: Take 1 tablet by mouth every 6 (six) hours as needed for moderate pain.  04/07/20  Yes Rhyne, Samantha J, PA-C  rosuvastatin (CRESTOR) 20 MG tablet TAKE 1 TABLET(20 MG) BY MOUTH DAILY AT 6 PM 04/04/20   Waynetta Sandy, MD  triamterene-hydrochlorothiazide (MAXZIDE-25) 37.5-25 MG tablet Take 0.5 tablets by mouth daily. 01/18/19   Vivi Barrack, MD    Physical Exam: Constitutional: Moderately built and nourished. Vitals:   04/17/20 1925 04/17/20 2013 04/17/20 2149 04/17/20 2312  BP: (!) 162/86 (!) 159/75 130/80 131/73  Pulse: (!) 55 (!) 55 (!) 52 61  Resp: 20 18 18 18   Temp:      TempSrc:      SpO2: 98% 98% 100% 96%   Eyes: Anicteric no pallor. ENMT: No discharge from the ears eyes nose or mouth. Neck: No mass felt.  No neck rigidity. Respiratory: No rhonchi or crepitations. Cardiovascular: S1-S2 heard. Abdomen: Soft nontender bowel sounds present. Musculoskeletal: No edema. Skin: No rash.  Groin wound seen.  Appears to be healing. Neurologic: Alert awake oriented to time place and person.  Moves all extremities 5 x 5.  No facial asymmetry tongue is midline.  Pupils are equal and reacting to light. Psychiatric: Appears normal.  Normal affect.   Labs on Admission: I have personally reviewed following labs and imaging studies  CBC: Recent Labs  Lab 04/17/20 1801 04/17/20 1851  WBC 9.4  --   NEUTROABS 6.9  --   HGB 12.7* 13.6  HCT 41.0 40.0  MCV 94.5  --   PLT 223  --    Basic Metabolic Panel: Recent Labs  Lab 04/17/20 1801 04/17/20 1851  NA 140 141  K 4.3 4.0  CL 106 103  CO2 25  --   GLUCOSE 117* 113*  BUN 20 24*  CREATININE 1.00 1.00  CALCIUM 8.6*  --    GFR: Estimated Creatinine Clearance: 65.2 mL/min (by C-G formula based on SCr of 1 mg/dL). Liver Function Tests: Recent Labs  Lab 04/17/20 1801  AST 20  ALT 24  ALKPHOS 64  BILITOT 0.6  PROT 6.3*  ALBUMIN 3.6   No results for input(s): LIPASE, AMYLASE in  the last 168 hours. No results for input(s): AMMONIA in the last 168 hours. Coagulation Profile: Recent Labs  Lab 04/17/20 1801  INR 1.1   Cardiac Enzymes: No results for input(s): CKTOTAL, CKMB, CKMBINDEX, TROPONINI in the last 168 hours. BNP (last 3 results) No results for input(s): PROBNP in the last 8760 hours. HbA1C: No results for input(s): HGBA1C in the last 72 hours. CBG: No results for input(s): GLUCAP in the last 168 hours. Lipid Profile: No results for input(s): CHOL, HDL, LDLCALC, TRIG, CHOLHDL, LDLDIRECT in the last 72 hours. Thyroid Function Tests: No results for  input(s): TSH, T4TOTAL, FREET4, T3FREE, THYROIDAB in the last 72 hours. Anemia Panel: No results for input(s): VITAMINB12, FOLATE, FERRITIN, TIBC, IRON, RETICCTPCT in the last 72 hours. Urine analysis:    Component Value Date/Time   COLORURINE YELLOW 03/27/2020 1334   APPEARANCEUR CLEAR 03/27/2020 1334   LABSPEC 1.020 03/27/2020 1334   PHURINE 5.0 03/27/2020 1334   GLUCOSEU NEGATIVE 03/27/2020 1334   GLUCOSEU NEGATIVE 12/15/2017 1458   HGBUR NEGATIVE 03/27/2020 1334   BILIRUBINUR NEGATIVE 03/27/2020 1334   KETONESUR NEGATIVE 03/27/2020 1334   PROTEINUR NEGATIVE 03/27/2020 1334   UROBILINOGEN 0.2 12/15/2017 1458   NITRITE NEGATIVE 03/27/2020 1334   LEUKOCYTESUR NEGATIVE 03/27/2020 1334   Sepsis Labs: @LABRCNTIP (procalcitonin:4,lacticidven:4) )No results found for this or any previous visit (from the past 240 hour(s)).   Radiological Exams on Admission: CT Angio Head W or Wo Contrast  Result Date: 04/18/2020 CLINICAL DATA:  Transient ischemic attack EXAM: CT ANGIOGRAPHY HEAD AND NECK TECHNIQUE: Multidetector CT imaging of the head and neck was performed using the standard protocol during bolus administration of intravenous contrast. Multiplanar CT image reconstructions and MIPs were obtained to evaluate the vascular anatomy. Carotid stenosis measurements (when applicable) are obtained utilizing NASCET  criteria, using the distal internal carotid diameter as the denominator. CONTRAST:  68mL OMNIPAQUE IOHEXOL 350 MG/ML SOLN COMPARISON:  None. FINDINGS: CTA NECK FINDINGS SKELETON: There is no bony spinal canal stenosis. No lytic or blastic lesion. OTHER NECK: Normal pharynx, larynx and major salivary glands. No cervical lymphadenopathy. Unremarkable thyroid gland. UPPER CHEST: No pneumothorax or pleural effusion. No nodules or masses. AORTIC ARCH: There is mild calcific atherosclerosis of the aortic arch. There is no aneurysm, dissection or hemodynamically significant stenosis of the visualized portion of the aorta. Conventional 3 vessel aortic branching pattern. The visualized proximal subclavian arteries are widely patent. RIGHT CAROTID SYSTEM: No dissection, occlusion or aneurysm. Mild atherosclerotic calcification at the carotid bifurcation without hemodynamically significant stenosis. LEFT CAROTID SYSTEM: No dissection, occlusion or aneurysm. Mild atherosclerotic calcification at the carotid bifurcation without hemodynamically significant stenosis. VERTEBRAL ARTERIES: Left dominant configuration. Both origins are clearly patent. There is no dissection, occlusion or flow-limiting stenosis to the skull base (V1-V3 segments). CTA HEAD FINDINGS POSTERIOR CIRCULATION: --Vertebral arteries: Normal V4 segments. --Inferior cerebellar arteries: Normal. --Basilar artery: Normal. --Superior cerebellar arteries: Normal. --Posterior cerebral arteries (PCA): Normal. Small right P-comm. No left P-comm visualized. ANTERIOR CIRCULATION: --Intracranial internal carotid arteries: Atherosclerotic calcification of the internal carotid arteries at the skull base without hemodynamically significant stenosis. --Anterior cerebral arteries (ACA): Normal. Hypoplastic right A1 segment, normal variant. --Middle cerebral arteries (MCA): Normal. VENOUS SINUSES: As permitted by contrast timing, patent. ANATOMIC VARIANTS: None Review of the MIP  images confirms the above findings. IMPRESSION: 1. No emergent large vessel occlusion or hemodynamically significant stenosis of the head or neck. 2. Aortic Atherosclerosis (ICD10-I70.0). Electronically Signed   By: Ulyses Jarred M.D.   On: 04/18/2020 03:40   CT HEAD WO CONTRAST  Result Date: 04/17/2020 CLINICAL DATA:  85 year old male with dizziness. EXAM: CT HEAD WITHOUT CONTRAST TECHNIQUE: Contiguous axial images were obtained from the base of the skull through the vertex without intravenous contrast. COMPARISON:  Head CT dated 01/15/2009. FINDINGS: Brain: Mild age-related atrophy and chronic microvascular ischemic changes. There is no acute intracranial hemorrhage. No mass effect or midline shift. No extra-axial fluid collection. Vascular: No hyperdense vessel or unexpected calcification. Skull: Normal. Negative for fracture or focal lesion. Sinuses/Orbits: There is diffuse mucoperiosteal thickening of paranasal sinuses. No air-fluid level. The mastoid air  cells are clear. Other: None IMPRESSION: 1. No acute intracranial pathology. 2. Mild age-related atrophy and chronic microvascular ischemic changes. Electronically Signed   By: Anner Crete M.D.   On: 04/17/2020 19:10   CT Angio Neck W and/or Wo Contrast  Result Date: 04/18/2020 CLINICAL DATA:  Transient ischemic attack EXAM: CT ANGIOGRAPHY HEAD AND NECK TECHNIQUE: Multidetector CT imaging of the head and neck was performed using the standard protocol during bolus administration of intravenous contrast. Multiplanar CT image reconstructions and MIPs were obtained to evaluate the vascular anatomy. Carotid stenosis measurements (when applicable) are obtained utilizing NASCET criteria, using the distal internal carotid diameter as the denominator. CONTRAST:  81mL OMNIPAQUE IOHEXOL 350 MG/ML SOLN COMPARISON:  None. FINDINGS: CTA NECK FINDINGS SKELETON: There is no bony spinal canal stenosis. No lytic or blastic lesion. OTHER NECK: Normal pharynx, larynx  and major salivary glands. No cervical lymphadenopathy. Unremarkable thyroid gland. UPPER CHEST: No pneumothorax or pleural effusion. No nodules or masses. AORTIC ARCH: There is mild calcific atherosclerosis of the aortic arch. There is no aneurysm, dissection or hemodynamically significant stenosis of the visualized portion of the aorta. Conventional 3 vessel aortic branching pattern. The visualized proximal subclavian arteries are widely patent. RIGHT CAROTID SYSTEM: No dissection, occlusion or aneurysm. Mild atherosclerotic calcification at the carotid bifurcation without hemodynamically significant stenosis. LEFT CAROTID SYSTEM: No dissection, occlusion or aneurysm. Mild atherosclerotic calcification at the carotid bifurcation without hemodynamically significant stenosis. VERTEBRAL ARTERIES: Left dominant configuration. Both origins are clearly patent. There is no dissection, occlusion or flow-limiting stenosis to the skull base (V1-V3 segments). CTA HEAD FINDINGS POSTERIOR CIRCULATION: --Vertebral arteries: Normal V4 segments. --Inferior cerebellar arteries: Normal. --Basilar artery: Normal. --Superior cerebellar arteries: Normal. --Posterior cerebral arteries (PCA): Normal. Small right P-comm. No left P-comm visualized. ANTERIOR CIRCULATION: --Intracranial internal carotid arteries: Atherosclerotic calcification of the internal carotid arteries at the skull base without hemodynamically significant stenosis. --Anterior cerebral arteries (ACA): Normal. Hypoplastic right A1 segment, normal variant. --Middle cerebral arteries (MCA): Normal. VENOUS SINUSES: As permitted by contrast timing, patent. ANATOMIC VARIANTS: None Review of the MIP images confirms the above findings. IMPRESSION: 1. No emergent large vessel occlusion or hemodynamically significant stenosis of the head or neck. 2. Aortic Atherosclerosis (ICD10-I70.0). Electronically Signed   By: Ulyses Jarred M.D.   On: 04/18/2020 03:40    EKG:  Independently reviewed.  Normal sinus rhythm.  Assessment/Plan Principal Problem:   TIA (transient ischemic attack) Active Problems:   Dyslipidemia   HTN (hypertension)   COPD, group A, by GOLD 2017 classification ([place])   Peripheral arterial disease ([place])    1. TIA -patient symptoms are concerning for TIA for which patient is placed on neurochecks patient passed swallow.  Patient is on aspirin statins MRI brain is pending 2D echo has been ordered check hemoglobin A1c lipid panel gentle hydration level permissive hypertension appreciate neurology consult.  Physical therapy consult. 2. History of hypertension we will hold diuretics for now and allow for permissive hypertension as needed IV hydralazine for systolic blood pressure more than 829 and diastolic more than 562. 3. Hyperlipidemia on statins. 4. COPD not actively wheezing. 5. Anemia hemoglobin at baseline follow CBC. 6. Peripheral artery disease status post recent Dacron patch for femoral artery on the right side by Dr. Oneida Alar.   DVT prophylaxis: Lovenox. Code Status: Full code. Family Communication: Discussed with patient's daughter. Disposition Plan: Home. Consults called: Neurology. Admission status: Observation.   Rise Patience MD [facility] Pager [phone].  If 7PM-7AM, please contact night-coverage  [email or web address] Password [email or web address]  04/18/2020, 4:55 AM

## 2020-04-19 ENCOUNTER — Other Ambulatory Visit: Payer: Self-pay | Admitting: Student

## 2020-04-19 ENCOUNTER — Encounter: Payer: Self-pay | Admitting: *Deleted

## 2020-04-19 DIAGNOSIS — G459 Transient cerebral ischemic attack, unspecified: Secondary | ICD-10-CM

## 2020-04-19 NOTE — Progress Notes (Signed)
Ordered 30-Day Event monitor for further evaluation of TIA at the request of Dr. Loleta Books ([facility]).

## 2020-04-19 NOTE — Progress Notes (Signed)
Patient ID: Bryan Armstrong, male   DOB: 1935-01-14, 85 y.o.   MRN: [id number] Patient enrolled for [employer] to ship a 30 day Cardiac event monitor to his home.  Letter with instructions mailed to patient.

## 2020-04-20 ENCOUNTER — Ambulatory Visit: Payer: Self-pay | Admitting: *Deleted

## 2020-04-23 ENCOUNTER — Other Ambulatory Visit: Payer: Self-pay | Admitting: *Deleted

## 2020-04-23 NOTE — Patient Outreach (Addendum)
[facility]) Care Management  04/23/2020  Bryan Armstrong 01-Jan-1935 [id number]    Subjective: Telephone call to patient's home number, spoke with patient, and HIPAA verified.  Discussed [place] [employer] EMMI General Discharge Red Flag Alert  follow up, patient voiced understanding, and is in agreement to follow up.   Patient states he is doing well and does not remember receiving EMMI automated calls.  States he has a scheduled follow up with primary MD on 04/24/2020, with surgeon on 04/26/2020, and with neurologist on 06/06/2020.   States he also recently had an ED visit 04/17/2020-04/18/2020 for TIA, is planning to discuss Plavix dosage with during primary MD visit and will also discuss with neurologist if needed.  States he feels like the Plavix may be dropping his blood pressure in the morning  and it recovers to his baseline blood pressure within a few hours.   Patient states he is aware of signs/ symptoms to report, how to reach provider if needed after hours, when to go to ED, and / or call 911.  Patient requested office number for Dr. Myrene Buddy ([facility]), states he was given Dr. Loleta Books business card but does not have contact number to reach Dr. Loleta Books, if he has questions.  RNCM advised of hospitalists role in follow with patient's post discharge, patient voiced understanding, and verbally given [facility] office number [phone]).   Patient states he will only follow up with Dr. Loleta Books regarding questions related to his ED visit, and will follow up with his outpatient providers regarding follow up questions as needed.   Patient states he is also assuming that he will have some follow up with a cardiologist in the future and will discuss with his providers. Patient states he is able to manage self care and has assistance as needed.  States he is accessing his [employer] benefits as needed via member services  number on back of card.  Patient states he is very busy, needs to wrap up this call,  and agrees to complete remainder of assessment at another time.  Patient states he does not have any education material, EMMI follow up, care coordination, care management, disease monitoring, transportation, community resource, or pharmacy needs at this time. States he is very appreciative of the follow up, is in agreement  to receive 1 additional follow up call to assess for further CM needs, and is in agreement to receive [place] [facility] EMMI follow up calls as needed.     Objective: Per KPN (Knowledge Performance Now, point of care tool) and chart review, patient had ED visit 04/17/2020 - 04/18/2020 for TIA.  Patient hospitalized 04/03/2020 - 04/07/2020 for PAD (peripheral artery disease, status post Right femoral endarterectomyon 04/03/2020.   Patient also has a history of COPD, hypertension, Dyslipidemia, colonic polyps, Emphysema of lung, Cataract (  bilateral cataracts removed) HYPERGLYCEMIA, and Former smoker.      Assessment: Received [employer] EMMI General Discharge Red Flag Alert follow up referral on 04/10/2020.   Red Flag Alert Trigger, Day # 1, patient answered no to the following question: Scheduled follow-up?    EMMI follow up completed and will follow up to assess further care management needs.      Plan: RNCM will call patient for telephone outreach attempt, within 21 business days, EMMI follow up, to assess for further CM needs, and proceed with case closure, after 4th unsuccessful  outreach call.      Aahana Elza H.  Annia Friendly, BSN, [place] Management [facility] Telephonic CM Phone: [phone] Fax: [phone]

## 2020-04-24 ENCOUNTER — Encounter: Payer: Self-pay | Admitting: Family Medicine

## 2020-04-24 ENCOUNTER — Ambulatory Visit (INDEPENDENT_AMBULATORY_CARE_PROVIDER_SITE_OTHER): Payer: Medicare Other | Admitting: Family Medicine

## 2020-04-24 ENCOUNTER — Other Ambulatory Visit: Payer: Self-pay

## 2020-04-24 VITALS — BP 111/68 | HR 61 | Temp 98.0°F | Ht 71.0 in | Wt 205.2 lb

## 2020-04-24 DIAGNOSIS — E785 Hyperlipidemia, unspecified: Secondary | ICD-10-CM

## 2020-04-24 DIAGNOSIS — G459 Transient cerebral ischemic attack, unspecified: Secondary | ICD-10-CM

## 2020-04-24 DIAGNOSIS — I10 Essential (primary) hypertension: Secondary | ICD-10-CM

## 2020-04-24 NOTE — Assessment & Plan Note (Signed)
At goal today. He uses maxide as needed. Discussed importance of good oral hydration. Continue home monitoring goal 140/90 or lower.

## 2020-04-24 NOTE — Progress Notes (Signed)
   Bryan Armstrong is a 85 y.o. male who presents today for an office visit.  Assessment/Plan:  HTN (hypertension) At goal today. He uses maxide as needed. Discussed importance of good oral hydration. Continue home monitoring goal 140/90 or lower.   Dyslipidemia Continue crestor 40mg  daily.   TIA (transient ischemic attack) No recurrence. He will continue dual antiplatelets for 3 weeks followed by aspirin alone. Will follow up with neurology later this month.      Subjective:  HPI:  Patient here today for hospital follow-up.  He presented to the ED on 04/17/2020 with sudden onset imbalance and feeling like he was going to pass out.  He was admitted for stroke evaluation.  MRI was performed which showed sinusitis but was negative for stroke.  Neurology was consulted.  Had echocardiogram which was normal.  EEG was also normal.  He was diagnosed with TIA and discharged home.  He was started on dual antiplatelet therapy for 3 w9eeks with Plavix and aspirin to be followed with aspirin alone.  He has done well over the past week since being home.  He has had occasional issues with low BP but otherwise has been back to his normal state of health. No weakness or numbness. No slurred speech. No vision changes.        Objective:  Physical Exam: BP 111/68   Pulse 61   Temp 98 F (36.7 C)   Ht 5\' 11"  (1.803 m)   Wt 205 lb 3.2 oz (93.1 kg)   SpO2 97%   BMI 28.62 kg/m   Gen: No acute distress, resting comfortably CV: Regular rate and rhythm with no murmurs appreciated Pulm: Normal work of breathing, clear to auscultation bilaterally with no crackles, wheezes, or rhonchi Neuro: Grossly normal, moves all extremities Psych: Normal affect and thought content      Bryan Armstrong M. Jerline Pain, MD 04/24/2020 11:47 AM

## 2020-04-24 NOTE — Assessment & Plan Note (Signed)
No recurrence. He will continue dual antiplatelets for 3 weeks followed by aspirin alone. Will follow up with neurology later this month.

## 2020-04-24 NOTE — Assessment & Plan Note (Signed)
Continue crestor 40mg daily. 

## 2020-04-26 ENCOUNTER — Ambulatory Visit (INDEPENDENT_AMBULATORY_CARE_PROVIDER_SITE_OTHER): Payer: Self-pay | Admitting: Vascular Surgery

## 2020-04-26 ENCOUNTER — Encounter: Payer: Self-pay | Admitting: Vascular Surgery

## 2020-04-26 ENCOUNTER — Other Ambulatory Visit: Payer: Self-pay

## 2020-04-26 VITALS — BP 94/59 | HR 65 | Temp 98.0°F | Resp 20 | Ht 71.0 in | Wt 205.0 lb

## 2020-04-26 DIAGNOSIS — I739 Peripheral vascular disease, unspecified: Secondary | ICD-10-CM

## 2020-04-26 NOTE — Progress Notes (Signed)
Patient is an 85 year old male who returns for postoperative follow-up today.  He underwent right common femoral endarterectomy on April 03, 2020.  He states his walking distance is significantly improved.  He is still limited by his left leg.  He needs a left femoral endarterectomy as well.  However he had a TIA a few weeks ago and neurology has suggested we should wait 3 months before considering any other interventions.  He is currently on Plavix and aspirin and will be transitioned to Plavix alone in the near future.  He has not had any further neurologic events.  He has no incisional drainage.  He has no fever or chills.  Physical exam:  Vitals:   04/26/20 1443  BP: (!) 94/59  Pulse: 65  Resp: 20  Temp: 98 F (36.7 C)  SpO2: 95%  Weight: 205 lb (93 kg)  Height: 5\' 11"  (1.803 m)   Right groin there is some maceration of the skin incision and slight separation.  There is no fluctuance there is no significant erythema  No palpable pedal pulses bilaterally capillary refill is brisk on the right compared to the left  Assessment: Continuing to slowly heal from right femoral endarterectomy.  Plan: Recheck right groin wound in 3 weeks to make sure it is completely healed.  Patient also discussed today that his blood pressure has been low but low he did not really complain of any dizziness or weakness.  I told him if it continues to be low he can discuss this further with his primary care physician.  We will consider doing a left common femoral endarterectomy in October if he has no further TIA events.  Ruta Hinds, MD [facility] Office: [phone]

## 2020-04-27 ENCOUNTER — Ambulatory Visit (INDEPENDENT_AMBULATORY_CARE_PROVIDER_SITE_OTHER): Payer: Medicare Other

## 2020-04-27 DIAGNOSIS — I4891 Unspecified atrial fibrillation: Secondary | ICD-10-CM | POA: Diagnosis not present

## 2020-04-27 DIAGNOSIS — G459 Transient cerebral ischemic attack, unspecified: Secondary | ICD-10-CM | POA: Diagnosis not present

## 2020-05-04 ENCOUNTER — Ambulatory Visit: Payer: Medicare Other | Admitting: Cardiovascular Disease

## 2020-05-04 ENCOUNTER — Other Ambulatory Visit: Payer: Self-pay | Admitting: Physician Assistant

## 2020-05-08 ENCOUNTER — Telehealth: Payer: Self-pay

## 2020-05-08 ENCOUNTER — Ambulatory Visit: Payer: Self-pay | Admitting: *Deleted

## 2020-05-08 NOTE — Telephone Encounter (Signed)
Pt called with c/o drops of blood after cleaning groin incision s/p femoral endarterectomy. He denies any redness, pus, odor, swelling, pain. He states he covered the area immediately. He is going to call back this afternoon after he tries to get a better look at it in front of the mirror and let us know how it looks. He also states he wants to stop taking Plavix. He has a f/u with MD on 8/5. I encouraged him to wait until that appt and discuss this with MD and he said he prefers to stop Plavix for now.

## 2020-05-15 ENCOUNTER — Ambulatory Visit: Payer: Self-pay | Admitting: *Deleted

## 2020-05-17 ENCOUNTER — Ambulatory Visit (INDEPENDENT_AMBULATORY_CARE_PROVIDER_SITE_OTHER): Payer: Self-pay | Admitting: Vascular Surgery

## 2020-05-17 ENCOUNTER — Encounter: Payer: Self-pay | Admitting: Vascular Surgery

## 2020-05-17 ENCOUNTER — Other Ambulatory Visit: Payer: Self-pay

## 2020-05-17 VITALS — BP 133/79 | HR 57 | Temp 98.1°F | Resp 20 | Ht 71.0 in | Wt 204.0 lb

## 2020-05-17 DIAGNOSIS — I739 Peripheral vascular disease, unspecified: Secondary | ICD-10-CM

## 2020-05-17 NOTE — Progress Notes (Signed)
Patient is an 85 year old male who returns for postoperative follow-up today.  He underwent right common femoral endarterectomy on April 03, 2020.  His walking distance is significantly improved.  He is starting to go back out to the golf course some.  He is still limited by his left leg.  He needs a left femoral endarterectomy as well.  However he had a TIA recently and neurology would prefer for [place] to wait for another intervention until October.  He currently is on Plavix and aspirin and will be transitioned to Plavix alone in the near future.  He has not had any further neurologic events.  He states his groin incision is healing and he has no further drainage.  He has no fever chills.  Physical exam:  Vitals:   05/17/20 1059  BP: 133/79  Pulse: (!) 57  Resp: 20  Temp: 98.1 F (36.7 C)  SpO2: 96%  Weight: 204 lb (92.5 kg)  Height: 5\' 11"  (1.803 m)    Extremities: Right groin less than 1 mm opening upper portion of the right groin incision no significant drainage or erythema  Assessment: Doing well status post right femoral endarterectomy incision essentially healed at this point.  Plan: Patient will follow up with me in October of this year with repeat bilateral ABIs and consideration for left femoral endarterectomy.  He will call sooner if he develops any new drainage or changes in the wound in his right groin.  Ruta Hinds, MD [facility] Office: [phone]

## 2020-05-18 ENCOUNTER — Other Ambulatory Visit: Payer: Self-pay | Admitting: *Deleted

## 2020-05-18 DIAGNOSIS — I739 Peripheral vascular disease, unspecified: Secondary | ICD-10-CM

## 2020-05-24 ENCOUNTER — Ambulatory Visit: Payer: Self-pay | Admitting: *Deleted

## 2020-05-29 ENCOUNTER — Other Ambulatory Visit: Payer: Self-pay | Admitting: Student

## 2020-05-29 DIAGNOSIS — I4891 Unspecified atrial fibrillation: Secondary | ICD-10-CM

## 2020-05-29 DIAGNOSIS — G459 Transient cerebral ischemic attack, unspecified: Secondary | ICD-10-CM

## 2020-05-31 ENCOUNTER — Ambulatory Visit: Payer: Self-pay | Admitting: *Deleted

## 2020-06-01 ENCOUNTER — Encounter: Payer: Self-pay | Admitting: *Deleted

## 2020-06-01 ENCOUNTER — Other Ambulatory Visit: Payer: Self-pay | Admitting: *Deleted

## 2020-06-01 NOTE — Patient Outreach (Signed)
[facility]) Care Management  06/01/2020  Bryan Armstrong 11/15/1934 [id number]    EMMI General Discharge Red Alert Follow up  Reassignment of patient received 05/31/20   EMMI Red Flag Alert Day:1 Date: 04/10/20 Reason: Scheduled Follow ? Patient answered No , EMMI follow up completed on 04/23/20  PMHX: per chart review  Patient hospitalized 04/03/2020 - 04/07/2020 for PAD(peripheral artery disease, status postRight femoral endarterectomyon 04/03/2020. ED visit 7/6-7/7 for TIA   Patient also has a history of COPD, hypertension, Dyslipidemia,colonic polyps,Emphysema of lung,   Subjective: Successful outreach call to patient , explained reason of the call , and prior RN Care Coordinator outreach following hospitalization and EMMI Red Flag alert. Patient recalls receiving prior call. Patient discussed that he is doing good, he discussed recent femoral endartectomy surgery and his is doing good, surgery site has healed up well.  He discussed TIA episode on 7/6, reviewed signs and  symptoms to seek medical attention for  patient states that he is aware and knows what to do if he has return of symptoms to get to ED.Report that he is only taking Aspirin now no longer on plavix. He reports receiving a phone call yesterday that his heart monitor study was okay.  He discussed having episode of low blood pressure with readings 93/ with episode he will have low energy and be a little dizzy, states that he just rest and make sure that he drinks plenty fluids,otherwise his blood pressure runs in the 121/70 range. He report only taking triamaterene-hydrochlorothiazide half tablet about one a week, he states that he has discussed this week Dr. Jerline Pain at visit and advised to importance of staying hydrated. Reinforced to notify MD of continued episodes. Patient discussed that he is able to complete is usual activity that includes playing golf at times. Patient discussed having visit with  neurology in the next 2 weeks.  Patient denies need for additional material material, states that he is unsure if he needs any  further follow but is he is agreeable to follow up call in the next 2 weeks after neurology visit.  Able to complete some  additional assessments as patient agreeable .   Assessment  EMMI red alert flag addressed on 7/12. Patient has completed post discharge PCP visit and scheduled neurology visit on 06/19/20.  Plan Will plan return call in the next 14 business  days and assess for further care management needs.    Joylene Draft, RN, BSN  [place] Management Coordinator  [phone]- Mobile [phone]- Toll Free Main Office

## 2020-06-06 ENCOUNTER — Inpatient Hospital Stay: Payer: Medicare Other | Admitting: Neurology

## 2020-06-19 ENCOUNTER — Other Ambulatory Visit: Payer: Self-pay

## 2020-06-19 ENCOUNTER — Ambulatory Visit: Payer: Medicare Other | Admitting: Neurology

## 2020-06-19 ENCOUNTER — Encounter: Payer: Self-pay | Admitting: Neurology

## 2020-06-19 VITALS — BP 93/64 | HR 60 | Ht 71.0 in | Wt 206.0 lb

## 2020-06-19 DIAGNOSIS — I951 Orthostatic hypotension: Secondary | ICD-10-CM | POA: Insufficient documentation

## 2020-06-19 DIAGNOSIS — G459 Transient cerebral ischemic attack, unspecified: Secondary | ICD-10-CM

## 2020-06-19 DIAGNOSIS — I952 Hypotension due to drugs: Secondary | ICD-10-CM

## 2020-06-19 DIAGNOSIS — I959 Hypotension, unspecified: Secondary | ICD-10-CM | POA: Insufficient documentation

## 2020-06-19 NOTE — Patient Instructions (Signed)
I had a long d/w patient about his recent TIA,episodes of symptomatic hypotension, risk for recurrent stroke/TIAs, personally independently reviewed imaging studies and stroke evaluation results and answered questions.Continue aspirin 81 mg daily  for secondary stroke prevention and maintain strict control of hypertension with blood pressure goal below 130/90, diabetes with hemoglobin A1c goal below 6.5% and lipids with LDL cholesterol goal below 70 mg/dL. I also advised the patient to eat a healthy diet with plenty of whole grains, cereals, fruits and vegetables, exercise regularly and maintain ideal body weight I advised the patient to keep a strict log of his blood pressure and to avoid taking Maxide to which she seems to be quite sensitive and is low blood pressure is causing him to have blurred vision and dizziness.  If his blood pressure is high enough he may consider switching to alternative antihypertensives after discussion with his primary physician.  Patient is neurologically cleared to undergo dental surgery..Followup in the future with my nurse practitioner Janett Billow in 6 months or call earlier if necessary.  Stroke Prevention Some medical conditions and behaviors are associated with a higher chance of having a stroke. You can help prevent a stroke by making nutrition, lifestyle, and other changes, including managing any medical conditions you may have. What nutrition changes can be made?   Eat healthy foods. You can do this by: ? Choosing foods high in fiber, such as fresh fruits and vegetables and whole grains. ? Eating at least 5 or more servings of fruits and vegetables a day. Try to fill half of your plate at each meal with fruits and vegetables. ? Choosing lean protein foods, such as lean cuts of meat, poultry without skin, fish, tofu, beans, and nuts. ? Eating low-fat dairy products. ? Avoiding foods that are high in salt (sodium). This can help lower blood pressure. ? Avoiding foods  that have saturated fat, trans fat, and cholesterol. This can help prevent high cholesterol. ? Avoiding processed and premade foods.  Follow your health care provider's specific guidelines for losing weight, controlling high blood pressure (hypertension), lowering high cholesterol, and managing diabetes. These may include: ? Reducing your daily calorie intake. ? Limiting your daily sodium intake to 1,500 milligrams (mg). ? Using only healthy fats for cooking, such as olive oil, canola oil, or sunflower oil. ? Counting your daily carbohydrate intake. What lifestyle changes can be made?  Maintain a healthy weight. Talk to your health care provider about your ideal weight.  Get at least 30 minutes of moderate physical activity at least 5 days a week. Moderate activity includes brisk walking, biking, and swimming.  Do not use any products that contain nicotine or tobacco, such as cigarettes and e-cigarettes. If you need help quitting, ask your health care provider. It may also be helpful to avoid exposure to secondhand smoke.  Limit alcohol intake to no more than 1 drink a day for nonpregnant women and 2 drinks a day for men. One drink equals 12 oz of beer, 5 oz of wine, or 1 oz of hard liquor.  Stop any illegal drug use.  Avoid taking birth control pills. Talk to your health care provider about the risks of taking birth control pills if: ? You are over 24 years old. ? You smoke. ? You get migraines. ? You have ever had a blood clot. What other changes can be made?  Manage your cholesterol levels. ? Eating a healthy diet is important for preventing high cholesterol. If cholesterol cannot be managed through  diet alone, you may also need to take medicines. ? Take any prescribed medicines to control your cholesterol as told by your health care provider.  Manage your diabetes. ? Eating a healthy diet and exercising regularly are important parts of managing your blood sugar. If your blood  sugar cannot be managed through diet and exercise, you may need to take medicines. ? Take any prescribed medicines to control your diabetes as told by your health care provider.  Control your hypertension. ? To reduce your risk of stroke, try to keep your blood pressure below 130/80. ? Eating a healthy diet and exercising regularly are an important part of controlling your blood pressure. If your blood pressure cannot be managed through diet and exercise, you may need to take medicines. ? Take any prescribed medicines to control hypertension as told by your health care provider. ? Ask your health care provider if you should monitor your blood pressure at home. ? Have your blood pressure checked every year, even if your blood pressure is normal. Blood pressure increases with age and some medical conditions.  Get evaluated for sleep disorders (sleep apnea). Talk to your health care provider about getting a sleep evaluation if you snore a lot or have excessive sleepiness.  Take over-the-counter and prescription medicines only as told by your health care provider. Aspirin or blood thinners (antiplatelets or anticoagulants) may be recommended to reduce your risk of forming blood clots that can lead to stroke.  Make sure that any other medical conditions you have, such as atrial fibrillation or atherosclerosis, are managed. What are the warning signs of a stroke? The warning signs of a stroke can be easily remembered as BEFAST.  B is for balance. Signs include: ? Dizziness. ? Loss of balance or coordination. ? Sudden trouble walking.  E is for eyes. Signs include: ? A sudden change in vision. ? Trouble seeing.  F is for face. Signs include: ? Sudden weakness or numbness of the face. ? The face or eyelid drooping to one side.  A is for arms. Signs include: ? Sudden weakness or numbness of the arm, usually on one side of the body.  S is for speech. Signs include: ? Trouble speaking  (aphasia). ? Trouble understanding.  T is for time. ? These symptoms may represent a serious problem that is an emergency. Do not wait to see if the symptoms will go away. Get medical help right away. Call your local emergency services (911 in the [place] Do not drive yourself to the hospital.  Other signs of stroke may include: ? A sudden, severe headache with no known cause. ? Nausea or vomiting. ? Seizure. Where to find more information For more information, visit:  American Stroke Association: www.strokeassociation.org  National Stroke Association: www.stroke.org Summary  You can prevent a stroke by eating healthy, exercising, not smoking, limiting alcohol intake, and managing any medical conditions you may have.  Do not use any products that contain nicotine or tobacco, such as cigarettes and e-cigarettes. If you need help quitting, ask your health care provider. It may also be helpful to avoid exposure to secondhand smoke.  Remember BEFAST for warning signs of stroke. Get help right away if you or a loved one has any of these signs. This information is not intended to replace advice given to you by your health care provider. Make sure you discuss any questions you have with your health care provider. Document Revised: 09/11/2017 Document Reviewed: 11/04/2016 Elsevier Patient Education  2020 [employer]  [employer]

## 2020-06-19 NOTE — Progress Notes (Signed)
[facility] [address] [place]. [place] [address] [phone]       OFFICE FOLLOW-UP NOTE  [name] Date of Birth:  [birth year] Medical Record Number:  [id number]   HPI: Bryan Armstrong is a 85 year old Caucasian male seen today for initial office follow-up visit following hospital consultation for TIA in July 2021.  History is obtained from the patient, review of electronic medical records and I personally reviewed available imaging films in PACS.  He has past medical history for hyperlipidemia, hypertension, peripheral arterial disease, COPD, bradycardia with history of syncope who presented to [facility] on 04/17/2020 with sudden onset of gait instability.  He felt suddenly off balance and was leaning to the right side and slammed into a wall.  He denied any accompanying vertigo, room spinning, lightheadedness blurred vision no slurred speech or double vision.  This lasted about 5 minutes and then gradually returned back to normal.  He stated he had a previous episode 10 years ago of suddenly collapsing after getting up from the dining table at a restaurant and was felt to have had a vasovagal syncope at that time.  He does have history of peripheral arterial disease and underwent right femoral endarterectomy on 04/03/2020 with Dr. Oneida Armstrong and left-sided endarterectomy is also planned.  MRI scan of the brain obtained in the hospital was negative for acute infarct and showed small changes of small vessel disease CT angiogram showed no significant extracranial intracranial large vessel stenosis or occlusion.  LDL cholesterol 57 mg percent and hemoglobin A1c was 5.7.  Transthoracic echo showed normal ejection fraction without cardiac source of embolism.  Outpatient cardiac Holter monitoring showed no significant cardiac arrhythmias.  Patient states he has had episodes of hypotension which are manifested with blurred vision and feeling tired and fuzzy headed.  The has  noticed this happen multiple times when his blood pressure has been below 427 systolic.  He does take Maxide about 2 or 3 days a week when his blood pressure is running in the 150s.  He seems to be exquisitely sensitive to this.  He plans to discuss with his primary care physician alternative blood pressure management strategies.  He admits to not drinking enough fluids.  He was scheduled to undergo dental surgery soon which she hopes will solve his problems.  He has had no recurrent stroke or TIA symptoms.  He remains on Crestor 20 mg he is tolerating well without muscle aches and pains.  ROS:   14 system review of systems is positive for blurred vision, dizziness, tiredness and all other systems negative  PMH:  Past Medical History:  Diagnosis Date  . BRADYCARDIA 06/01/2007  . Cataract    bil cataracts removed  . COPD (chronic obstructive pulmonary disease) ([place])   . EMPHYSEMA 03/28/2008  . Emphysema of lung ([place])   . GERD (gastroesophageal reflux disease)   . HYPERGLYCEMIA 06/01/2007  . HYPERLIPIDEMIA 06/01/2007  . Hypertension   . HYPERTENSION 01/19/2009  . Peripheral arterial disease ([place])     Social History:  Social History   Socioeconomic History  . Marital status: Married    Spouse name: Not on file  . Number of children: Not on file  . Years of education: Not on file  . Highest education level: Not on file  Occupational History  . Occupation: Retired   Tobacco Use  . Smoking status: Former Smoker    Packs/day: 0.50    Years: 40.00    Pack years: 20.00  Types: Cigarettes    Quit date: 09/26/2017    Years since quitting: 2.7  . Smokeless tobacco: Never Used  . Tobacco comment: stopped 2018  Vaping Use  . Vaping Use: Never used  Substance and Sexual Activity  . Alcohol use: No  . Drug use: No  . Sexual activity: Not on file  Other Topics Concern  . Not on file  Social History Narrative   Lives at home with wife   Right handed   Caffeine: drinks de-caf drinks,  diet drinks if its a coke.   Social Determinants of Health   Financial Resource Strain:   . Difficulty of Paying Living Expenses: Not on file  Food Insecurity:   . Worried About Charity fundraiser in the Last Year: Not on file  . Ran Out of Food in the Last Year: Not on file  Transportation Needs:   . Lack of Transportation (Medical): Not on file  . Lack of Transportation (Non-Medical): Not on file  Physical Activity:   . Days of Exercise per Week: Not on file  . Minutes of Exercise per Session: Not on file  Stress:   . Feeling of Stress : Not on file  Social Connections:   . Frequency of Communication with Friends and Family: Not on file  . Frequency of Social Gatherings with Friends and Family: Not on file  . Attends Religious Services: Not on file  . Active Member of Clubs or Organizations: Not on file  . Attends Archivist Meetings: Not on file  . Marital Status: Not on file  Intimate Partner Violence:   . Fear of Current or Ex-Partner: Not on file  . Emotionally Abused: Not on file  . Physically Abused: Not on file  . Sexually Abused: Not on file    Medications:   Current Outpatient Medications on File Prior to Visit  Medication Sig Dispense Refill  . acetaminophen (TYLENOL) 325 MG tablet Take 325-650 mg by mouth every 6 (six) hours as needed for mild pain.    Marland Kitchen aspirin 81 MG tablet Take 81 mg by mouth daily.     . fluticasone (FLONASE) 50 MCG/ACT nasal spray Place 2 sprays into both nostrils daily as needed for allergies or rhinitis.    . Fluticasone-Salmeterol (ADVAIR DISKUS) 250-50 MCG/DOSE AEPB inhale 1 dose by mouth twice a day (Patient taking differently: Inhale 1 puff into the lungs daily. ) 60 each 5  . Multiple Vitamins-Minerals (PRESERVISION AREDS 2 PO) Take 1 capsule by mouth daily.    . rosuvastatin (CRESTOR) 20 MG tablet TAKE 1 TABLET(20 MG) BY MOUTH DAILY AT 6 PM 90 tablet 3  . triamterene-hydrochlorothiazide (MAXZIDE-25) 37.5-25 MG tablet Take  0.5 tablets by mouth daily. (Patient taking differently: Take 0.5 tablets by mouth. Every 2 days.) 45 tablet 3  . clopidogrel (PLAVIX) 75 MG tablet Take 1 tablet (75 mg total) by mouth daily. (Patient not taking: Reported on 06/19/2020) 30 tablet 3  . EPINEPHrine (EPIPEN 2-PAK) 0.3 mg/0.3 mL IJ SOAJ injection Inject 0.3 mLs (0.3 mg total) into the muscle as needed for anaphylaxis. 1 each 0  . rosuvastatin (CRESTOR) 40 MG tablet Take 1 tablet (40 mg total) by mouth daily at 6 PM. (Patient not taking: Reported on 06/19/2020) 30 tablet 3   No current facility-administered medications on file prior to visit.    Allergies:   Allergies  Allergen Reactions  . Bee Venom Anaphylaxis    Yellow jackets    Physical Exam General: well  developed, well nourished pleasant elderly Caucasian male, seated, in no evident distress Head: head normocephalic and atraumatic.  Neck: supple with no carotid or supraclavicular bruits Cardiovascular: regular rate and rhythm, no murmurs Musculoskeletal: no deformity Skin:  no rash/petichiae Vascular:  Normal pulses all extremities Vitals:   06/19/20 1355  BP: 93/64  Pulse: 60   Neurologic Exam Mental Status: Awake and fully alert. Oriented to place and time. Recent and remote memory intact. Attention span, concentration and fund of knowledge appropriate. Mood and affect appropriate.  Cranial Nerves: Fundoscopic exam reveals sharp disc margins. Pupils equal, briskly reactive to light. Extraocular movements full without nystagmus. Visual fields full to confrontation. Hearing slightly diminished bilaterally. Facial sensation intact. Face, tongue, palate moves normally and symmetrically.  Motor: Normal bulk and tone. Normal strength in all tested extremity muscles. Sensory.: intact to touch ,pinprick .position and vibratory sensation.  Coordination: Rapid alternating movements normal in all extremities. Finger-to-nose and heel-to-shin performed accurately  bilaterally. Gait and Station: Arises from chair without difficulty. Stance is normal. Gait demonstrates normal stride length and balance . Able to heel, toe and tandem walk with moderate difficulty.  Reflexes: 1+ and symmetric. Toes downgoing.   NIHSS  0 Modified Rankin 1   ASSESSMENT: 85 year old male with posterior circulation TIA in July 2021 from small vessel disease.  Vascular risk factors of hypertension hyperlipidemia, peripheral arterial disease and age.  He is also having symptomatic hypotension related to his blood pressure medication.    PLAN: I had a long d/w patient about his recent TIA,episodes of symptomatic hypotension, risk for recurrent stroke/TIAs, personally independently reviewed imaging studies and stroke evaluation results and answered questions.Continue aspirin 81 mg daily  for secondary stroke prevention and maintain strict control of hypertension with blood pressure goal below 130/90, diabetes with hemoglobin A1c goal below 6.5% and lipids with LDL cholesterol goal below 70 mg/dL. I also advised the patient to eat a healthy diet with plenty of whole grains, cereals, fruits and vegetables, exercise regularly and maintain ideal body weight I advised the patient to keep a strict log of his blood pressure and to avoid taking Maxide to which she seems to be quite sensitive and is low blood pressure is causing him to have blurred vision and dizziness.  If his blood pressure is high enough he may consider switching to alternative antihypertensives after discussion with his primary physician.  Patient is neurologically cleared to undergo dental surgery..Followup in the future with my nurse practitioner Janett Billow in 6 months or call earlier if necessary. Greater than 50% of time during this 25 minute visit was spent on counseling,explanation of diagnosis of TIA, planning of further management, discussion with patient and family and coordination of care Antony Contras, MD Note: This  document was prepared with digital dictation and possible smart phrase technology. Any transcriptional errors that result from this process are unintentional

## 2020-06-21 ENCOUNTER — Other Ambulatory Visit: Payer: Self-pay | Admitting: *Deleted

## 2020-06-21 NOTE — Patient Outreach (Signed)
[facility]) Care Management  06/21/2020  Bryan Armstrong Apr 15, 1935 [id number]     EMMI General Discharge Red Alert Follow up  Reassignment of patient received 05/31/20   EMMI Red Flag Alert Day:1 Date: 04/10/20 Reason: Scheduled Follow ? Patient answered No , EMMI follow up completed on 04/23/20  PMHX: per chart review  Patient hospitalized 04/03/2020 - 04/07/2020 for PAD(peripheral artery disease, status postRight femoral endarterectomyon 04/03/2020. ED visit 7/6-7/7 for TIA   Patient also has a history of COPD, hypertension, Dyslipidemia,colonic polyps,Emphysema of lung,    Subjective: Successful outreach call to patient , reviewed prior outreach and agreement to follow up call at this time.  Patient states that he is doing just fine,he had his visit with Dr. Leonie Man and everything is just fine. He plans to follow up with his   Primary care doctor regarding concerns with episodes of low blood pressure 100'/ when taking maxide , only takes few time a week if blood pressure is running a little higher such as 150's/. He reports having appointment already scheduled.   Assessment  EMMI red alert flag addressed on 7/12. Patient has completed post discharge PCP visit and scheduled neurology visit on 06/19/20. Patient denies any other Care management needs at this time.  Discussed [facility] care management telephonic program for ongoing education and support of chronic medical conditions, patient adamantly declines additional follow up.   Plan Will close to [facility] care management, no further care management needs following EMMI red alert flag alert outreaches.    Joylene Draft, RN, BSN  [place] Management Coordinator  [phone]- Mobile [phone]- Toll Free Main Office

## 2020-07-11 ENCOUNTER — Encounter (INDEPENDENT_AMBULATORY_CARE_PROVIDER_SITE_OTHER): Payer: Medicare Other | Admitting: Ophthalmology

## 2020-07-11 ENCOUNTER — Other Ambulatory Visit: Payer: Self-pay

## 2020-07-11 DIAGNOSIS — H353132 Nonexudative age-related macular degeneration, bilateral, intermediate dry stage: Secondary | ICD-10-CM

## 2020-07-11 DIAGNOSIS — H43813 Vitreous degeneration, bilateral: Secondary | ICD-10-CM

## 2020-07-11 DIAGNOSIS — H35372 Puckering of macula, left eye: Secondary | ICD-10-CM | POA: Diagnosis not present

## 2020-07-11 DIAGNOSIS — H35033 Hypertensive retinopathy, bilateral: Secondary | ICD-10-CM

## 2020-07-11 DIAGNOSIS — I10 Essential (primary) hypertension: Secondary | ICD-10-CM

## 2020-07-12 ENCOUNTER — Encounter: Payer: Self-pay | Admitting: Family Medicine

## 2020-07-12 ENCOUNTER — Ambulatory Visit (INDEPENDENT_AMBULATORY_CARE_PROVIDER_SITE_OTHER): Payer: Medicare Other | Admitting: Family Medicine

## 2020-07-12 ENCOUNTER — Telehealth: Payer: Self-pay | Admitting: Cardiovascular Disease

## 2020-07-12 VITALS — BP 124/77 | HR 62 | Temp 97.6°F | Ht 71.0 in | Wt 207.6 lb

## 2020-07-12 DIAGNOSIS — R42 Dizziness and giddiness: Secondary | ICD-10-CM

## 2020-07-12 DIAGNOSIS — R001 Bradycardia, unspecified: Secondary | ICD-10-CM

## 2020-07-12 DIAGNOSIS — G459 Transient cerebral ischemic attack, unspecified: Secondary | ICD-10-CM

## 2020-07-12 DIAGNOSIS — I10 Essential (primary) hypertension: Secondary | ICD-10-CM | POA: Diagnosis not present

## 2020-07-12 DIAGNOSIS — I952 Hypotension due to drugs: Secondary | ICD-10-CM | POA: Diagnosis not present

## 2020-07-12 NOTE — Progress Notes (Signed)
   Bryan Armstrong is a 85 y.o. male who presents today for an office visit.  Assessment/Plan:  New/Acute Problems: Dizziness Has had pretty extensive work-up over the last couple months including echocardiogram, MRI, and CT angio neck which were nondiagnostic of anything that could explain his dizziness.  Most likely having symptomatic hypotension given his low readings into the 90s over 60s at home.  Advised him to stop taking Maxide completely and stay well-hydrated.  Also place referral to ENT and cardiology per patient request.  Chronic Problems Addressed Today: HTN (hypertension) See above.  He is having symptomatic lows.  Will stop Maxide.  Continue home monitoring.  Bradycardia Potentially could explain some of dizziness as well.  Will place referral to cardiology per request.  TIA (transient ischemic attack) Continue aspirin and statin.     Subjective:  HPI:  Here for dizziness follow-up.  He was diagnosed with TIA about 3 months ago.  He has had extensive work-up since then including MRI, CT angiogram, and echocardiogram.  He has been following with neurology.  Overall symptoms have been stable though still has occasional dizziness.  He is concerned that his blood pressure is dropping too low.  He currently takes Maxide 2-3 times weekly for elevated blood pressure readings.  He has been monitoring blood pressures at home and they are typically in the 120s to 130s but can sometimes dip into the 90s over 60s.  Last readings have been in the 150s over 80s.  No reported chest pain or shortness of breath.  Has occasional vertigo/room spinning symptoms as well.  No weakness or numbness.  No recent changes to any of his medications.  He has been trying to stay well-hydrated.       Objective:  Physical Exam: BP 124/77   Pulse 62   Temp 97.6 F (36.4 C) (Temporal)   Ht 5\' 11"  (1.803 m)   Wt 207 lb 9.6 oz (94.2 kg)   SpO2 96%   BMI 28.95 kg/m   Gen: No acute distress, resting  comfortably CV: Regular rate and rhythm with no murmurs appreciated Pulm: Normal work of breathing, clear to auscultation bilaterally with no crackles, wheezes, or rhonchi Neuro: Grossly normal, moves all extremities Psych: Normal affect and thought content      Elwin Tsou M. Jerline Pain, MD 07/12/2020 12:23 PM

## 2020-07-12 NOTE — Assessment & Plan Note (Signed)
Potentially could explain some of dizziness as well.  Will place referral to cardiology per request.

## 2020-07-12 NOTE — Assessment & Plan Note (Signed)
Continue aspirin and statin. 

## 2020-07-12 NOTE — Patient Instructions (Signed)
It was very nice to see you today!  STOP your blood pressure medication.  Please make sure that you are getting plenty of fluids.  I will place a referral for you to see ear nose and throat and I will also place a referral for you to see cardiology.  Take care, Dr Jerline Pain  Please try these tips to maintain a healthy lifestyle:   Eat at least 3 REAL meals and 1-2 snacks per day.  Aim for no more than 5 hours between eating.  If you eat breakfast, please do so within one hour of getting up.    Each meal should contain half fruits/vegetables, one quarter protein, and one quarter carbs (no bigger than a computer mouse)   Cut down on sweet beverages. This includes juice, soda, and sweet tea.     Drink at least 1 glass of water with each meal and aim for at least 8 glasses per day   Exercise at least 150 minutes every week.

## 2020-07-12 NOTE — Telephone Encounter (Signed)
Patient would like to switch providers from Dr. Gwenlyn Found to one of the providers at [facility]. His wife is a patient of Dr. Burt Knack and ideally he would like to see him, but he would just like to go to any of the doctors at [facility]. He does not want to drive to multiple doctors offices between himself and his wife.   Please leat the patient know what the office decides

## 2020-07-12 NOTE — Assessment & Plan Note (Signed)
See above.  He is having symptomatic lows.  Will stop Maxide.  Continue home monitoring.

## 2020-07-13 NOTE — Telephone Encounter (Signed)
That is fine with me as well

## 2020-07-13 NOTE — Telephone Encounter (Signed)
I'm fine to see him. I know him well - have taken care of his wife for a long time

## 2020-07-13 NOTE — Telephone Encounter (Signed)
Scheduled the patient 10/12 with Dr. Burt Knack. He was grateful for assistance.

## 2020-07-13 NOTE — Telephone Encounter (Signed)
Approval to switch

## 2020-07-13 NOTE — Telephone Encounter (Signed)
Left message to call back  

## 2020-07-13 NOTE — Telephone Encounter (Signed)
D[place] is returning Duncan call.

## 2020-07-17 ENCOUNTER — Telehealth: Payer: Self-pay

## 2020-07-17 ENCOUNTER — Other Ambulatory Visit: Payer: Self-pay | Admitting: *Deleted

## 2020-07-17 MED ORDER — MECLIZINE HCL 25 MG PO TABS: 25.0000 mg | ORAL_TABLET | Freq: Three times a day (TID) | ORAL | 0 refills | Status: DC | PRN

## 2020-07-17 NOTE — Telephone Encounter (Signed)
Ok to send in meclizine 25mg  tid prn. Please ask patient if he has been able to see ENT yet.  Algis Greenhouse. Jerline Pain, MD 07/17/2020 2:08 PM

## 2020-07-17 NOTE — Telephone Encounter (Signed)
Please advise 

## 2020-07-17 NOTE — Telephone Encounter (Signed)
Pt would like a prescription for vertigo sent in to his pharmacy

## 2020-07-17 NOTE — Telephone Encounter (Signed)
Rx send to pharmacy,  Patient stated has an appointment with ENT first week of November

## 2020-07-24 ENCOUNTER — Ambulatory Visit: Payer: Medicare Other | Admitting: Cardiovascular Disease

## 2020-07-24 ENCOUNTER — Other Ambulatory Visit: Payer: Self-pay

## 2020-07-24 ENCOUNTER — Encounter: Payer: Self-pay | Admitting: Cardiovascular Disease

## 2020-07-24 VITALS — BP 124/68 | HR 68 | Ht 71.0 in | Wt 208.0 lb

## 2020-07-24 DIAGNOSIS — I739 Peripheral vascular disease, unspecified: Secondary | ICD-10-CM

## 2020-07-24 DIAGNOSIS — I10 Essential (primary) hypertension: Secondary | ICD-10-CM | POA: Diagnosis not present

## 2020-07-24 DIAGNOSIS — E782 Mixed hyperlipidemia: Secondary | ICD-10-CM | POA: Diagnosis not present

## 2020-07-24 NOTE — Progress Notes (Signed)
Cardiology Office Note:    Date:  07/24/2020   ID:  KAYRON HICKLIN, DOB 1935-06-23, MRN [id number]  PCP:  Vivi Barrack, MD  [facility] Cardiologist:  Sherren Mocha, MD  [facility] Electrophysiologist:  None   Referring MD: Vivi Barrack, MD   Chief Complaint  Patient presents with   PAD    History of Present Illness:    Bryan Armstrong is a 85 y.o. male with a hx of hypertension, bradycardia, and peripheral arterial disease.  He has been seen by Dr. Gwenlyn Found in the past and is here to establish care with me.  I also see his wife for her cardiac problems.  The patient was noted to have intermittent claudication and has undergone peripheral vascular angiography followed by right femoral endarterectomy surgeries by Dr. Oneida Alar.  He then experienced a dizziness and was felt to have suffered a posterior circulation TIA. However, a follow-up MRI showed no significant abnormality other than expected 'age-related' changes. He is planning on moving forward with left femoral endarterectomy after he sees Dr Oneida Alar again in the near future.  The patient is a former smoker, quit in 2018.  He denies chest pain, shortness of breath, or further lightheadedness. His BP medications were reduced because of symptomatic hypotension. No palpitations. No orthopnea, PND, or leg swelling.   Past Medical History:  Diagnosis Date   BRADYCARDIA 06/01/2007   Cataract    bil cataracts removed   COPD (chronic obstructive pulmonary disease) ([place])    EMPHYSEMA 03/28/2008   Emphysema of lung (HCC)    GERD (gastroesophageal reflux disease)    HYPERGLYCEMIA 06/01/2007   HYPERLIPIDEMIA 06/01/2007   Hypertension    HYPERTENSION 01/19/2009   Peripheral arterial disease ([place])     Past Surgical History:  Procedure Laterality Date   ABDOMINAL AORTOGRAM W/LOWER EXTREMITY N/A 03/05/2020   Procedure: ABDOMINAL AORTOGRAM W/  LOWER EXTREMITY;  Surgeon: Lorretta Harp, MD;  Location: [facility];  Service: Cardiovascular;  Laterality: N/A;   CATARACT EXTRACTION, BILATERAL     COLONOSCOPY     ENDARTERECTOMY FEMORAL Right 04/03/2020   Procedure: Right Femoral Endarterectomy;  Surgeon: Elam Dutch, MD;  Location: [facility] OR;  Service: Vascular;  Laterality: Right;   EYE SURGERY     POLYPECTOMY     TONSILLECTOMY AND ADENOIDECTOMY      Current Medications: Current Meds  Medication Sig   acetaminophen (TYLENOL) 325 MG tablet Take 325-650 mg by mouth every 6 (six) hours as needed for mild pain.   aspirin 81 MG tablet Take 81 mg by mouth daily.    EPINEPHrine (EPIPEN 2-PAK) 0.3 mg/0.3 mL IJ SOAJ injection Inject 0.3 mLs (0.3 mg total) into the muscle as needed for anaphylaxis.   fluticasone (FLONASE) 50 MCG/ACT nasal spray Place 2 sprays into both nostrils daily as needed for allergies or rhinitis.   Fluticasone-Salmeterol (ADVAIR DISKUS) 250-50 MCG/DOSE AEPB inhale 1 dose by mouth twice a day   meclizine (ANTIVERT) 25 MG tablet Take 1 tablet (25 mg total) by mouth 3 (three) times daily as needed for dizziness.   Multiple Vitamins-Minerals (PRESERVISION AREDS 2 PO) Take 1 capsule by mouth daily.   rosuvastatin (CRESTOR) 20 MG tablet TAKE 1 TABLET(20 MG) BY MOUTH DAILY AT 6 PM     Allergies:   Bee venom   Social History   Socioeconomic History   Marital status: Married    Spouse name: Not on file   Number of children: Not on file  Years of education: Not on file   Highest education level: Not on file  Occupational History   Occupation: Retired   Tobacco Use   Smoking status: Former Smoker    Packs/day: 0.50    Years: 40.00    Pack years: 20.00    Types: Cigarettes    Quit date: 09/26/2017    Years since quitting: 2.8   Smokeless tobacco: Never Used   Tobacco comment: stopped 2018  Vaping Use   Vaping Use: Never used  Substance and Sexual Activity   Alcohol use: No   Drug use: No   Sexual activity: Not on file  Other Topics Concern    Not on file  Social History Narrative   Lives at home with wife   Right handed   Caffeine: drinks de-caf drinks, diet drinks if its a coke.   Social Determinants of Health   Financial Resource Strain:    Difficulty of Paying Living Expenses: Not on file  Food Insecurity:    Worried About Charity fundraiser in the Last Year: Not on file   [employer] of Food in the Last Year: Not on file  Transportation Needs:    Lack of Transportation (Medical): Not on file   Lack of Transportation (Non-Medical): Not on file  Physical Activity:    Days of Exercise per Week: Not on file   Minutes of Exercise per Session: Not on file  Stress:    Feeling of Stress : Not on file  Social Connections:    Frequency of Communication with Friends and Family: Not on file   Frequency of Social Gatherings with Friends and Family: Not on file   Attends Religious Services: Not on file   Active Member of Clubs or Organizations: Not on file   Attends Archivist Meetings: Not on file   Marital Status: Not on file     Family History: The patient's family history includes Cancer in his mother; Heart disease in his father; Uterine cancer in his mother. There is no history of Colon cancer, Esophageal cancer, Rectal cancer, Stomach cancer, Liver cancer, Pancreatic cancer, Prostate cancer, or Stroke.  ROS:   Please see the history of present illness.    All other systems reviewed and are negative.  EKGs/Labs/Other Studies Reviewed:    The following studies were reviewed today: Echo 04-18-2020: IMPRESSIONS    1. Left ventricular ejection fraction, by estimation, is 60 to 65%. The  left ventricle has normal function. The left ventricle has no regional  wall motion abnormalities. Left ventricular diastolic parameters were  normal.  2. Right ventricular systolic function is normal. The right ventricular  size is normal.  3. Left atrial size was mildly dilated.  4. The mitral valve is  normal in structure. Trivial mitral valve  regurgitation. No evidence of mitral stenosis.  5. The aortic valve is normal in structure. Aortic valve regurgitation is  not visualized. Mild aortic valve sclerosis is present, with no evidence  of aortic valve stenosis.  6. The inferior vena cava is normal in size with greater than 50%  respiratory variability, suggesting right atrial pressure of 3 mmHg.   Myoview Stress Test 03-21-2020: Study Highlights   Nuclear stress EF: 59%.  There was no ST segment deviation noted during stress.  The study is normal.  This is a low risk study.  The left ventricular ejection fraction is normal (55-65%).    EKG:  EKG is not ordered today.    Recent Labs:  02/28/2020: TSH 2.490 04/17/2020: ALT 24; BUN 24; Potassium 4.0; Sodium 141 04/18/2020: Creatinine, Ser 0.93; Hemoglobin 13.1; Platelets 222  Recent Lipid Panel    Component Value Date/Time   CHOL 107 04/18/2020 0528   TRIG 58 04/18/2020 0528   HDL 38 (L) 04/18/2020 0528   CHOLHDL 2.8 04/18/2020 0528   VLDL 12 04/18/2020 0528   LDLCALC 57 04/18/2020 0528     Risk Assessment/Calculations:       Physical Exam:    VS:  BP 124/68    Pulse 68    Ht 5\' 11"  (1.803 m)    Wt 208 lb (94.3 kg)    SpO2 96%    BMI 29.01 kg/m     Wt Readings from Last 3 Encounters:  07/24/20 208 lb (94.3 kg)  07/12/20 207 lb 9.6 oz (94.2 kg)  06/19/20 206 lb (93.4 kg)     GEN:  Well nourished, well developed in no acute distress HEENT: Normal NECK: No JVD; No carotid bruits LYMPHATICS: No lymphadenopathy CARDIAC: RRR, no murmurs, rubs, gallops RESPIRATORY:  Clear to auscultation without rales, wheezing or rhonchi  ABDOMEN: Soft, non-tender, non-distended MUSCULOSKELETAL:  No edema; No deformity  SKIN: Warm and dry NEUROLOGIC:  Alert and oriented x 3 PSYCHIATRIC:  Normal affect   ASSESSMENT:    1. PAD (peripheral artery disease) ([place])   2. Mixed hyperlipidemia   3. Essential hypertension    PLAN:     In order of problems listed above:  1. The patient is followed closely by Dr. Oneida Alar.  He seems to be getting along quite well after his right femoral endarterectomy.  He may ultimately require left femoral endarterectomy and he has an upcoming office visit.  The patient is on appropriate medical therapy with aspirin and a statin drug. 2. Lipids reviewed with a cholesterol of 107, HDL 38, LDL 57, triglycerides 58.  ALT is normal at 24.  He continues on rosuvastatin 20 mg daily. 3. His antihypertensives were stopped because of symptomatic hypotension.  He seems to be getting along much better now and has no symptoms since he has stopped his blood pressure medicine.  Blood pressure today is 124/68.  The patient will return in one year for follow-up evaluation unless problems arise in the future. I reviewed his recent echo and Myoview studies today with results outlined above. He does not require any further testing at this time.    Medication Adjustments/Labs and Tests Ordered: Current medicines are reviewed at length with the patient today.  Concerns regarding medicines are outlined above.  No orders of the defined types were placed in this encounter.  No orders of the defined types were placed in this encounter.   Patient Instructions  Medication Instructions:  Your provider recommends that you continue on your current medications as directed. Please refer to the Current Medication list given to you today.   *If you need a refill on your cardiac medications before your next appointment, please call your pharmacy*   Follow-Up: At [facility], you and your health needs are our priority.  As part of our continuing mission to provide you with exceptional heart care, we have created designated Provider Care Teams.  These Care Teams include your primary Cardiologist (physician) and Advanced Practice Providers (APPs -  Physician Assistants and Nurse Practitioners) who all work together to  provide you with the care you need, when you need it. Your next appointment:   12 month(s) The format for your next appointment:   In  Person Provider:   You may see Sherren Mocha, MD or one of the following Advanced Practice Providers on your designated Care Team:    Richardson Dopp, PA-C  Robbie Lis, [place]      Signed, Sherren Mocha, MD  07/24/2020 2:24 PM    [place]

## 2020-07-24 NOTE — Patient Instructions (Signed)

## 2020-08-02 ENCOUNTER — Other Ambulatory Visit: Payer: Self-pay | Admitting: *Deleted

## 2020-08-02 ENCOUNTER — Encounter: Payer: Self-pay | Admitting: Vascular Surgery

## 2020-08-02 ENCOUNTER — Ambulatory Visit (HOSPITAL_COMMUNITY)
Admission: RE | Admit: 2020-08-02 | Discharge: 2020-08-02 | Disposition: A | Discharge status: Home / Self Care | Payer: Medicare Other | Source: Ambulatory Visit | Attending: Vascular Surgery | Admitting: Vascular Surgery

## 2020-08-02 ENCOUNTER — Other Ambulatory Visit: Payer: Self-pay

## 2020-08-02 ENCOUNTER — Ambulatory Visit (INDEPENDENT_AMBULATORY_CARE_PROVIDER_SITE_OTHER): Payer: Medicare Other | Admitting: Vascular Surgery

## 2020-08-02 ENCOUNTER — Encounter: Payer: Self-pay | Admitting: *Deleted

## 2020-08-02 VITALS — BP 98/67 | HR 62 | Temp 97.6°F | Resp 20 | Ht 71.0 in | Wt 205.9 lb

## 2020-08-02 DIAGNOSIS — I739 Peripheral vascular disease, unspecified: Secondary | ICD-10-CM | POA: Diagnosis not present

## 2020-08-02 NOTE — Progress Notes (Signed)
Patient is an 85 year old male who returns for follow-up today.  He was last seen August 2021.  We are considering doing a left femoral endarterectomy.  He previously underwent right common femoral endarterectomy April 03, 2020.  His walking distance significantly improved after that.  He is still limited by his left leg.  He did have a TIA recently and neurology wished for [place] to defer his left femoral endarterectomy until October.  He has not had any further neurologic events.  His right groin incision is well-healed.  He is currently on aspirin.  Current Outpatient Medications on File Prior to Visit  Medication Sig Dispense Refill  . acetaminophen (TYLENOL) 325 MG tablet Take 325-650 mg by mouth every 6 (six) hours as needed for mild pain.    Marland Kitchen aspirin 81 MG tablet Take 81 mg by mouth daily.     Marland Kitchen EPINEPHrine (EPIPEN 2-PAK) 0.3 mg/0.3 mL IJ SOAJ injection Inject 0.3 mLs (0.3 mg total) into the muscle as needed for anaphylaxis. 1 each 0  . fluticasone (FLONASE) 50 MCG/ACT nasal spray Place 2 sprays into both nostrils daily as needed for allergies or rhinitis.    . Fluticasone-Salmeterol (ADVAIR DISKUS) 250-50 MCG/DOSE AEPB inhale 1 dose by mouth twice a day 60 each 5  . meclizine (ANTIVERT) 25 MG tablet Take 1 tablet (25 mg total) by mouth 3 (three) times daily as needed for dizziness. 30 tablet 0  . Multiple Vitamins-Minerals (PRESERVISION AREDS 2 PO) Take 1 capsule by mouth daily.    . rosuvastatin (CRESTOR) 20 MG tablet TAKE 1 TABLET(20 MG) BY MOUTH DAILY AT 6 PM 90 tablet 3   No current facility-administered medications on file prior to visit.   Past Medical History:  Diagnosis Date  . BRADYCARDIA 06/01/2007  . Cataract    bil cataracts removed  . COPD (chronic obstructive pulmonary disease) ([place])   . EMPHYSEMA 03/28/2008  . Emphysema of lung ([place])   . GERD (gastroesophageal reflux disease)   . HYPERGLYCEMIA 06/01/2007  . HYPERLIPIDEMIA 06/01/2007  . Hypertension   . HYPERTENSION 01/19/2009   . Peripheral arterial disease [facility])     Past Surgical History:  Procedure Laterality Date  . ABDOMINAL AORTOGRAM W/LOWER EXTREMITY N/A 03/05/2020   Procedure: ABDOMINAL AORTOGRAM W/  LOWER EXTREMITY;  Surgeon: Lorretta Harp, MD;  Location: [facility];  Service: Cardiovascular;  Laterality: N/A;  . CATARACT EXTRACTION, BILATERAL    . COLONOSCOPY    . ENDARTERECTOMY FEMORAL Right 04/03/2020   Procedure: Right Femoral Endarterectomy;  Surgeon: Elam Dutch, MD;  Location: [facility] OR;  Service: Vascular;  Laterality: Right;  . EYE SURGERY    . POLYPECTOMY    . TONSILLECTOMY AND ADENOIDECTOMY       Review of systems: He has no shortness of breath.  He has no chest pain.  Physical exam:  Vitals:   08/02/20 1450  BP: 98/67  Pulse: 62  Resp: 20  Temp: 97.6 F (36.4 C)  SpO2: 95%  Weight: 205 lb 14.4 oz (93.4 kg)  Height: 5\' 11"  (1.803 m)    Extremities: 2+ femoral pulses, well-healed groin incision right side, no palpable pedal pulses  Skin: No ulcer  Cardiac: Regular rate and rhythm  Abdomen: Soft nontender  Data: Patient had bilateral ABIs performed today.  Right side was biphasic with 1.04, left side was 0.68 also biphasic.  Assessment: Patient with improved function right leg but still with lifestyle limiting claudication left leg.  He wishes to proceed with left femoral  endarterectomy at this point.  Plan: Left femoral endarterectomy scheduled for September 10, 2020.  Risk benefits possible complications of procedure details were discussed the patient today include not limited to bleeding infection possible vessel injury or limb loss.  Ruta Hinds, MD [facility] Office: [phone]

## 2020-08-24 DIAGNOSIS — H903 Sensorineural hearing loss, bilateral: Secondary | ICD-10-CM | POA: Diagnosis not present

## 2020-08-24 DIAGNOSIS — R42 Dizziness and giddiness: Secondary | ICD-10-CM | POA: Diagnosis not present

## 2020-09-04 NOTE — Progress Notes (Signed)
[id number], [place]. [place] [address] Phone: [phone] Fax: [place], [address] [place] [address] Phone: [phone] Fax: [phone]      Your procedure is scheduled on 09/10/2020.  Report to [facility] Main Entrance "A" at 07:40 A.M., and check in at the Admitting office.  Call this number if you have problems the morning of surgery:  [phone]  Call [phone] if you have any questions prior to your surgery date Monday-Friday 8am-4pm    Remember:  Do not eat or drink after midnight the night before your surgery     Take these medicines the morning of surgery with A SIP OF WATER  Fluticasone-Salmeterol (ADVAIR DISKUS)- please bring inhaler with you the day of surgery rosuvastatin (CRESTOR)  Take the following medications if needed the morning of surgery.  fluticasone (FLONASE) meclizine (ANTIVERT)  As of today, STOP taking any Aspirin (unless otherwise instructed by your surgeon) Aleve, Naproxen, Ibuprofen, Motrin, Advil, Goody's, BC's, all herbal medications, fish oil, and all vitamins.                      Do not wear jewelry.            Do not wear lotions, powders, perfumes/colognes, or deodorant.            Men may shave face and neck.            Do not bring valuables to the hospital.            [facility] is not responsible for any belongings or valuables.  Do NOT Smoke (Tobacco/Vaping) or drink Alcohol 24 hours prior to your procedure If you use a CPAP at night, you may bring all equipment for your overnight stay.   Contacts, glasses, dentures or bridgework may not be worn into surgery.      For patients admitted to the hospital, discharge time will be determined by your treatment team.   Patients discharged the day of surgery will not be allowed  to drive home, and someone needs to stay with them for 24 hours.    Special instructions:   [place]- Preparing For Surgery  Before surgery, you can play an important role. Because skin is not sterile, your skin needs to be as free of germs as possible. You can reduce the number of germs on your skin by washing with CHG (chlorahexidine gluconate) Soap before surgery.  CHG is an antiseptic cleaner which kills germs and bonds with the skin to continue killing germs even after washing.    Oral Hygiene is also important to reduce your risk of infection.  Remember - BRUSH YOUR TEETH THE MORNING OF SURGERY WITH YOUR REGULAR TOOTHPASTE  Please do not use if you have an allergy to CHG or antibacterial soaps. If your skin becomes reddened/irritated stop using the CHG.  Do not shave (including legs and underarms) for at least 48 hours prior to first CHG shower. It is OK to shave your face.  Please follow these instructions carefully.   1. Shower the NIGHT BEFORE SURGERY and the MORNING OF SURGERY with CHG Soap.   2. If you chose to wash your hair, wash your hair first as usual with your normal shampoo.  3. After you shampoo, rinse your hair and body thoroughly to remove  the shampoo.  4. Use CHG as you would any other liquid soap. You can apply CHG directly to the skin and wash gently with a scrungie or a clean washcloth.   5. Apply the CHG Soap to your body ONLY FROM THE NECK DOWN.  Do not use on open wounds or open sores. Avoid contact with your eyes, ears, mouth and genitals (private parts). Wash Face and genitals (private parts)  with your normal soap.   6. Wash thoroughly, paying special attention to the area where your surgery will be performed.  7. Thoroughly rinse your body with warm water from the neck down.  8. DO NOT shower/wash with your normal soap after using and rinsing off the CHG Soap.  9. Pat yourself dry with a CLEAN TOWEL.  10. Wear CLEAN PAJAMAS to bed the night before  surgery  11. Place CLEAN SHEETS on your bed the night of your first shower and DO NOT SLEEP WITH PETS.   Day of Surgery: Wear Clean/Comfortable clothing the morning of surgery Do not apply any deodorants/lotions.   Remember to brush your teeth WITH YOUR REGULAR TOOTHPASTE.   Please read over the following fact sheets that you were given.

## 2020-09-05 ENCOUNTER — Encounter (HOSPITAL_COMMUNITY)
Admission: RE | Admit: 2020-09-05 | Discharge: 2020-09-05 | Disposition: A | Discharge status: Home / Self Care | Payer: Medicare Other | Source: Ambulatory Visit | Attending: Vascular Surgery | Admitting: Vascular Surgery

## 2020-09-05 ENCOUNTER — Encounter (HOSPITAL_COMMUNITY): Payer: Self-pay

## 2020-09-05 ENCOUNTER — Other Ambulatory Visit: Payer: Self-pay

## 2020-09-05 DIAGNOSIS — Z7982 Long term (current) use of aspirin: Secondary | ICD-10-CM | POA: Insufficient documentation

## 2020-09-05 DIAGNOSIS — Z79899 Other long term (current) drug therapy: Secondary | ICD-10-CM | POA: Diagnosis not present

## 2020-09-05 DIAGNOSIS — Z8673 Personal history of transient ischemic attack (TIA), and cerebral infarction without residual deficits: Secondary | ICD-10-CM | POA: Insufficient documentation

## 2020-09-05 DIAGNOSIS — I10 Essential (primary) hypertension: Secondary | ICD-10-CM | POA: Insufficient documentation

## 2020-09-05 DIAGNOSIS — Z7951 Long term (current) use of inhaled steroids: Secondary | ICD-10-CM | POA: Insufficient documentation

## 2020-09-05 DIAGNOSIS — Z01812 Encounter for preprocedural laboratory examination: Secondary | ICD-10-CM | POA: Diagnosis not present

## 2020-09-05 DIAGNOSIS — I739 Peripheral vascular disease, unspecified: Secondary | ICD-10-CM | POA: Insufficient documentation

## 2020-09-05 HISTORY — DX: Transient cerebral ischemic attack, unspecified: G45.9

## 2020-09-05 LAB — URINALYSIS, ROUTINE W REFLEX MICROSCOPIC
Bilirubin Urine: NEGATIVE
Glucose, UA: NEGATIVE mg/dL
Hgb urine dipstick: NEGATIVE
Ketones, ur: NEGATIVE mg/dL
Leukocytes,Ua: NEGATIVE
Nitrite: NEGATIVE
Protein, ur: NEGATIVE mg/dL
Specific Gravity, Urine: 1.017 (ref 1.005–1.030)
pH: 6 (ref 5.0–8.0)

## 2020-09-05 LAB — COMPREHENSIVE METABOLIC PANEL
ALT: 26 U/L (ref 0–44)
AST: 21 U/L (ref 15–41)
Albumin: 3.9 g/dL (ref 3.5–5.0)
Alkaline Phosphatase: 57 U/L (ref 38–126)
Anion gap: 11 (ref 5–15)
BUN: 19 mg/dL (ref 8–23)
CO2: 25 mmol/L (ref 22–32)
Calcium: 9.1 mg/dL (ref 8.9–10.3)
Chloride: 103 mmol/L (ref 98–111)
Creatinine, Ser: 1.1 mg/dL (ref 0.61–1.24)
GFR, Estimated: >60 mL/min (ref >60)
Glucose, Bld: 109 mg/dL — ABNORMAL HIGH (ref 70–99)
Potassium: 4.5 mmol/L (ref 3.5–5.1)
Sodium: 139 mmol/L (ref 135–145)
Total Bilirubin: 1.1 mg/dL (ref 0.3–1.2)
Total Protein: 7.2 g/dL (ref 6.5–8.1)

## 2020-09-05 LAB — PROTIME-INR
INR: 1 (ref 0.8–1.2)
Prothrombin Time: 13.2 seconds (ref 11.4–15.2)

## 2020-09-05 LAB — APTT: aPTT: 32 seconds (ref 24–36)

## 2020-09-05 LAB — CBC
HCT: 45.6 % (ref 39.0–52.0)
Hemoglobin: 14.4 g/dL (ref 13.0–17.0)
MCH: 29.3 pg (ref 26.0–34.0)
MCHC: 31.6 g/dL (ref 30.0–36.0)
MCV: 92.9 fL (ref 80.0–100.0)
Platelets: 203 10*3/uL (ref 150–400)
RBC: 4.91 MIL/uL (ref 4.22–5.81)
RDW: 13.4 % (ref 11.5–15.5)
WBC: 9.1 10*3/uL (ref 4.0–10.5)
nRBC: 0 % (ref 0.0–0.2)

## 2020-09-05 LAB — SURGICAL PCR SCREEN
MRSA, PCR: NEGATIVE
Staphylococcus aureus: NEGATIVE

## 2020-09-05 LAB — TYPE AND SCREEN
ABO/RH(D): O NEG
Antibody Screen: NEGATIVE

## 2020-09-05 NOTE — Progress Notes (Addendum)
Your procedure is scheduled on Mon., Nov. 29, 2021 at 9:40AM-1:14PM             Report to [facility] Entrance "A" at 07:40 A.M., and check in at the Admitting office.             Call this number if you have problems the morning of surgery:             [phone]  Call [phone] if you have any questions prior to your surgery date Monday-Friday 8am-4pm               Remember:             Do not eat or drink after midnight the night before your surgery                           Take these medicines the morning of surgery with A SIP OF WATER:     Aspirin     Rosuvastatin (CRESTOR) Fluticasone-Salmeterol (ADVAIR DISKUS)- bring with you the day of surgery   If Needed:  Epinephrine (Epi- Pen) Fluticasone (FLONASE) Meclizine (ANTIVERT)  As of today, STOP taking any Aspirin (unless otherwise instructed by your surgeon) Aleve, Naproxen, Ibuprofen, Motrin, Advil, Goody's, BC's, all herbal medications, fish oil, and all vitamins.   Do not wear jewelry. Do notwear lotions, powders, perfumes/colognes, or deodorant. Men may shave face and neck. Do notbring valuables to the hospital. [facility] is not responsible for any belongings or valuables.  Do NOT Smoke (Tobacco/Vaping) or drink Alcohol 24 hours prior to your procedure If you use a CPAP at night, you may bring all equipment for your overnight stay.  Contacts, glasses, dentures or bridgework may not be worn into surgery.    For patients admitted to the hospital, discharge time will be determined by your treatment team.  Patients discharged the day of surgery will not be allowed to drive home, and someone needs to stay with them for 24 hours.  Special instructions:   - Preparing For Surgery  Before surgery, you can play an important role. Because skin is not sterile, your skin needs to be as free of germs as possible. You can reduce  the number of germs on your skin by washing with CHG (chlorahexidine gluconate) Soap before surgery.  CHG is an antiseptic cleaner which kills germs and bonds with the skin to continue killing germs even after washing.    Oral Hygiene is also important to reduce your risk of infection.  Remember - BRUSH YOUR TEETH THE MORNING OF SURGERY WITH YOUR REGULAR TOOTHPASTE  Please do not use if you have an allergy to CHG or antibacterial soaps. If your skin becomes reddened/irritated stop using the CHG.  Do not shave (including legs and underarms) for at least 48 hours prior to first CHG shower. It is OK to shave your face.  Please follow these instructions carefully.  1. Shower the NIGHT BEFORE SURGERY and the MORNING OF SURGERY with CHG Soap.   2. If you chose to wash your hair, wash your hair first as usual with your normal shampoo.  3. After you shampoo, rinse your hair and body thoroughly to remove the shampoo.  4. Use CHG as you would any other liquid soap. You can apply CHG directly to the skin and wash gently with a scrungie or a clean washcloth.   5. Apply the CHG Soap to your body ONLY FROM THE NECK DOWN.  Do not use on open wounds or open sores. Avoid contact with your eyes, ears, mouth and genitals (private parts). Wash Face and genitals (private parts)  with your normal soap.   6. Wash thoroughly, paying special attention to the area where your surgery will be performed.  7. Thoroughly rinse your body with warm water from the neck down.  8. DO NOT shower/wash with your normal soap after using and rinsing off the CHG Soap.  9. Pat yourself dry with a CLEAN TOWEL.  10. Wear CLEAN PAJAMAS to bed the night before surgery  11. Place CLEAN SHEETS on your bed the night of your first shower and DO NOT SLEEP WITH PETS.  Day of Surgery: Wear  Clean/Comfortable clothing the morning of surgery Do notapply any deodorants/lotions.  Remember to brush your teeth WITH YOUR REGULAR TOOTHPASTE.  Please read over the following fact sheets that you were given.

## 2020-09-05 NOTE — Progress Notes (Signed)
PCP - Dr. Zack Seal- [place]  Cardiologist - Dr. Ezzie Dural- C.C. 07/24/20 (E)  Chest x-ray - 11/11/19 (E)  EKG - 04/18/20 (E)  Stress Test - 03/21/20 (E)  ECHO - 04/18/20 (E)  Cardiac Cath - Denies  AICD-na PM-na LOOP-na  Sleep Study - Denies CPAP - Denies  LABS- 09/05/20: CBC, CMP, PT, PTT, PCR, T/S, UA 09/07/20: COVID  ASA- Cont.  ERAS- No  HA1C- Denies  Anesthesia- Yes- cardiac clearance   Pt denies having chest pain, sob, or fever at this time. All instructions explained to the pt, with a verbal understanding of the material. Pt agrees to go over the instructions while at home for a better understanding. Pt also instructed to self quarantine after being tested for COVID-19. The opportunity to ask questions was provided.   Coronavirus Screening  Have you experienced the following symptoms:  Cough yes/no: No Fever (>100.79F)  yes/no: No Runny nose yes/no: No Sore throat yes/no: No Difficulty breathing/shortness of breath  yes/no: No  Have you or a family member traveled in the last 14 days and where? yes/no: No   If the patient indicates "YES" to the above questions, their PAT will be rescheduled to limit the exposure to others and, the surgeon will be notified. THE PATIENT WILL NEED TO BE ASYMPTOMATIC FOR 14 DAYS.   If the patient is not experiencing any of these symptoms, the PAT nurse will instruct them to NOT bring anyone with them to their appointment since they may have these symptoms or traveled as well.   Please remind your patients and families that hospital visitation restrictions are in effect and the importance of the restrictions.

## 2020-09-07 ENCOUNTER — Encounter (HOSPITAL_COMMUNITY): Payer: Self-pay

## 2020-09-07 ENCOUNTER — Other Ambulatory Visit (HOSPITAL_COMMUNITY)
Admission: RE | Admit: 2020-09-07 | Discharge: 2020-09-07 | Disposition: A | Discharge status: Home / Self Care | Payer: Medicare Other | Source: Ambulatory Visit | Attending: Vascular Surgery | Admitting: Vascular Surgery

## 2020-09-07 DIAGNOSIS — J449 Chronic obstructive pulmonary disease, unspecified: Secondary | ICD-10-CM | POA: Diagnosis not present

## 2020-09-07 DIAGNOSIS — Z01812 Encounter for preprocedural laboratory examination: Secondary | ICD-10-CM | POA: Insufficient documentation

## 2020-09-07 DIAGNOSIS — E785 Hyperlipidemia, unspecified: Secondary | ICD-10-CM | POA: Diagnosis not present

## 2020-09-07 DIAGNOSIS — J439 Emphysema, unspecified: Secondary | ICD-10-CM | POA: Diagnosis not present

## 2020-09-07 DIAGNOSIS — Z7982 Long term (current) use of aspirin: Secondary | ICD-10-CM | POA: Diagnosis not present

## 2020-09-07 DIAGNOSIS — Z79899 Other long term (current) drug therapy: Secondary | ICD-10-CM | POA: Diagnosis not present

## 2020-09-07 DIAGNOSIS — Z8673 Personal history of transient ischemic attack (TIA), and cerebral infarction without residual deficits: Secondary | ICD-10-CM | POA: Diagnosis not present

## 2020-09-07 DIAGNOSIS — I10 Essential (primary) hypertension: Secondary | ICD-10-CM | POA: Diagnosis not present

## 2020-09-07 DIAGNOSIS — Z20822 Contact with and (suspected) exposure to covid-19: Secondary | ICD-10-CM | POA: Insufficient documentation

## 2020-09-07 DIAGNOSIS — I70212 Atherosclerosis of native arteries of extremities with intermittent claudication, left leg: Secondary | ICD-10-CM | POA: Diagnosis not present

## 2020-09-07 DIAGNOSIS — K219 Gastro-esophageal reflux disease without esophagitis: Secondary | ICD-10-CM | POA: Diagnosis not present

## 2020-09-07 LAB — SARS CORONAVIRUS 2 (TAT 6-24 HRS): SARS Coronavirus 2: NEGATIVE

## 2020-09-07 NOTE — Progress Notes (Signed)
Anesthesia Chart Review:   Case: [id number] Date/Time: 09/10/20 0925   Procedure: ENDARTERECTOMY FEMORAL LEFT (Left )   Anesthesia type: General   Pre-op diagnosis: PAD   [facility] / [facility]   Surgeons: Elam Dutch, MD      DISCUSSION:  Pt is an 85 year old with hx HTN, PAD, TIA (04/17/20). S/p R femoral endarterectomy on 04/03/20.    VS: BP (!) 157/85   Pulse (!) 57   Temp (!) 36.2 C (Oral)   Resp 18   Ht 5\' 11"  (1.803 m)   Wt 95.4 kg   SpO2 99%   BMI 29.34 kg/m    PROVIDERS: - PCP is Vivi Barrack, MD - Cardiologist is Sherren Mocha, MD. Last office visit 07/24/20. Dr. Antionette Char note indicates he was aware L femoral endarterectomy was likely upcoming.  - Neurologist is Antony Contras, MD. Last office visit 06/19/20 for TIA; 6 month f/u recommended.    LABS: Labs reviewed: Acceptable for surgery. (all labs ordered are listed, but only abnormal results are displayed)  Labs Reviewed  COMPREHENSIVE METABOLIC PANEL - Abnormal; Notable for the following components:      Result Value   Glucose, Bld 109 (*)    All other components within normal limits  SURGICAL PCR SCREEN  APTT  CBC  PROTIME-INR  URINALYSIS, ROUTINE W REFLEX MICROSCOPIC  TYPE AND SCREEN     IMAGES: CXR 11/11/19: No active cardiopulmonary disease.  CT angio head and neck 04/18/20:  1. No emergent large vessel occlusion or hemodynamically significant stenosis of the head or neck. 2. Aortic Atherosclerosis   MR brain 04/18/20:  1. Aging brain without acute or reversible finding. 2. Generalized sinusitis   EKG 04/17/20:  NSR with sinus arrhythmia. RBBB. Minimal voltage criteria for LVH, may be normal variant ( R in aVL ). T wave abnormality, consider lateral ischemia   CV: Cardiac event monitor 05/29/20:  1. SR/SB/ST 2. No signif arrhythmias noted   Echo 04/18/20:  1. Left ventricular ejection fraction, by estimation, is 60 to 65%. The left ventricle has normal function. The left  ventricle has no regional wall motion abnormalities. Left ventricular diastolic parameters were normal.  2. Right ventricular systolic function is normal. The right ventricular size is normal.  3. Left atrial size was mildly dilated.  4. The mitral valve is normal in structure. Trivial mitral valve regurgitation. No evidence of mitral stenosis.  5. The aortic valve is normal in structure. Aortic valve regurgitation is not visualized. Mild aortic valve sclerosis is present, with no evidence of aortic valve stenosis.  6. The inferior vena cava is normal in size with greater than 50% respiratory variability, suggesting right atrial pressure of 3 mmHg.    Nuclear stress test 03/21/20:   Nuclear stress EF: 59%.  There was no ST segment deviation noted during stress.  The study is normal.  This is a low risk study.  The left ventricular ejection fraction is normal (55-65%).     Past Medical History:  Diagnosis Date  . BRADYCARDIA 06/01/2007  . Cataract    bil cataracts removed  . COPD (chronic obstructive pulmonary disease) ([place])   . EMPHYSEMA 03/28/2008  . Emphysema of lung ([place])   . GERD (gastroesophageal reflux disease)   . HYPERGLYCEMIA 06/01/2007  . HYPERLIPIDEMIA 06/01/2007  . Hypertension   . HYPERTENSION 01/19/2009  . Peripheral arterial disease ([place])   . TIA (transient ischemic attack)     Past Surgical History:  Procedure Laterality Date  . ABDOMINAL AORTOGRAM W/LOWER EXTREMITY N/A 03/05/2020   Procedure: ABDOMINAL AORTOGRAM W/  LOWER EXTREMITY;  Surgeon: Lorretta Harp, MD;  Location: [facility];  Service: Cardiovascular;  Laterality: N/A;  . CATARACT EXTRACTION, BILATERAL    . COLONOSCOPY    . ENDARTERECTOMY FEMORAL Right 04/03/2020   Procedure: Right Femoral Endarterectomy;  Surgeon: Elam Dutch, MD;  Location: [facility] OR;  Service: Vascular;  Laterality: Right;  . EYE SURGERY     Bilaterl cataracts  . POLYPECTOMY    . TONSILLECTOMY AND ADENOIDECTOMY       MEDICATIONS: . APPLE CIDER VINEGAR PO  . aspirin 81 MG tablet  . EPINEPHrine (EPIPEN 2-PAK) 0.3 mg/0.3 mL IJ SOAJ injection  . fluticasone (FLONASE) 50 MCG/ACT nasal spray  . Fluticasone-Salmeterol (ADVAIR DISKUS) 250-50 MCG/DOSE AEPB  . meclizine (ANTIVERT) 25 MG tablet  . Multiple Vitamins-Minerals (PRESERVISION AREDS 2 PO)  . rosuvastatin (CRESTOR) 10 MG tablet  . rosuvastatin (CRESTOR) 20 MG tablet  . triamterene-hydrochlorothiazide (MAXZIDE-25) 37.5-25 MG tablet   No current facility-administered medications for this encounter.    If no changes, I anticipate pt can proceed with surgery as scheduled.   Willeen Cass, PhD, FNP-BC [facility]/Anesthesiology Phone: [phone] 09/07/2020 9:43 AM

## 2020-09-07 NOTE — Anesthesia Preprocedure Evaluation (Addendum)
Anesthesia Evaluation  Patient identified by MRN, date of birth, ID band Patient awake    Reviewed: Allergy & Precautions, NPO status , Patient's Chart, lab work & pertinent test results  Airway Mallampati: II  TM Distance: >3 FB Neck ROM: Full    Dental  (+) Teeth Intact, Dental Advisory Given   Pulmonary former smoker,    breath sounds clear to auscultation       Cardiovascular hypertension,  Rhythm:Regular Rate:Normal     Neuro/Psych    GI/Hepatic   Endo/Other    Renal/GU      Musculoskeletal   Abdominal   Peds  Hematology   Anesthesia Other Findings   Reproductive/Obstetrics                            Anesthesia Physical Anesthesia Plan  ASA: III  Anesthesia Plan: General   Post-op Pain Management:    Induction: Intravenous  PONV Risk Score and Plan: Ondansetron and Dexamethasone  Airway Management Planned: Oral ETT  Additional Equipment:   Intra-op Plan:   Post-operative Plan: Extubation in OR  Informed Consent: I have reviewed the patients History and Physical, chart, labs and discussed the procedure including the risks, benefits and alternatives for the proposed anesthesia with the patient or authorized representative who has indicated his/her understanding and acceptance.     Dental advisory given  Plan Discussed with: CRNA and Anesthesiologist  Anesthesia Plan Comments: (See APP note by Durel Salts, FNP )       Anesthesia Quick Evaluation

## 2020-09-10 ENCOUNTER — Encounter (HOSPITAL_COMMUNITY): Payer: Self-pay | Admitting: Vascular Surgery

## 2020-09-10 ENCOUNTER — Inpatient Hospital Stay (HOSPITAL_COMMUNITY): Payer: Medicare Other | Admitting: Emergency Medicine

## 2020-09-10 ENCOUNTER — Other Ambulatory Visit: Payer: Self-pay

## 2020-09-10 ENCOUNTER — Inpatient Hospital Stay (HOSPITAL_COMMUNITY)
Admission: RE | Admit: 2020-09-10 | Discharge: 2020-09-11 | DRG: 254 | Disposition: A | Discharge status: Home / Self Care | Payer: Medicare Other | Attending: Vascular Surgery | Admitting: Vascular Surgery

## 2020-09-10 ENCOUNTER — Encounter (HOSPITAL_COMMUNITY)
Admission: RE | Disposition: A | Discharge status: Home / Self Care | Payer: Self-pay | Source: Home / Self Care | Attending: Vascular Surgery

## 2020-09-10 ENCOUNTER — Inpatient Hospital Stay (HOSPITAL_COMMUNITY): Payer: Medicare Other | Admitting: Certified Registered Nurse Anesthetist

## 2020-09-10 DIAGNOSIS — Z8673 Personal history of transient ischemic attack (TIA), and cerebral infarction without residual deficits: Secondary | ICD-10-CM | POA: Diagnosis not present

## 2020-09-10 DIAGNOSIS — Z7982 Long term (current) use of aspirin: Secondary | ICD-10-CM | POA: Diagnosis not present

## 2020-09-10 DIAGNOSIS — J449 Chronic obstructive pulmonary disease, unspecified: Secondary | ICD-10-CM

## 2020-09-10 DIAGNOSIS — K219 Gastro-esophageal reflux disease without esophagitis: Secondary | ICD-10-CM | POA: Diagnosis present

## 2020-09-10 DIAGNOSIS — Z79899 Other long term (current) drug therapy: Secondary | ICD-10-CM

## 2020-09-10 DIAGNOSIS — I70212 Atherosclerosis of native arteries of extremities with intermittent claudication, left leg: Secondary | ICD-10-CM | POA: Diagnosis present

## 2020-09-10 DIAGNOSIS — E785 Hyperlipidemia, unspecified: Secondary | ICD-10-CM | POA: Diagnosis present

## 2020-09-10 DIAGNOSIS — I739 Peripheral vascular disease, unspecified: Secondary | ICD-10-CM

## 2020-09-10 DIAGNOSIS — I10 Essential (primary) hypertension: Secondary | ICD-10-CM | POA: Diagnosis present

## 2020-09-10 DIAGNOSIS — J439 Emphysema, unspecified: Secondary | ICD-10-CM | POA: Diagnosis present

## 2020-09-10 DIAGNOSIS — Z20822 Contact with and (suspected) exposure to covid-19: Secondary | ICD-10-CM | POA: Diagnosis present

## 2020-09-10 HISTORY — PX: ENDARTERECTOMY FEMORAL: SHX5804

## 2020-09-10 HISTORY — PX: PATCH ANGIOPLASTY: SHX6230

## 2020-09-10 LAB — CBC
HCT: 42.7 % (ref 39.0–52.0)
Hemoglobin: 13.8 g/dL (ref 13.0–17.0)
MCH: 29.2 pg (ref 26.0–34.0)
MCHC: 32.3 g/dL (ref 30.0–36.0)
MCV: 90.3 fL (ref 80.0–100.0)
Platelets: 182 10*3/uL (ref 150–400)
RBC: 4.73 MIL/uL (ref 4.22–5.81)
RDW: 13.2 % (ref 11.5–15.5)
WBC: 10 10*3/uL (ref 4.0–10.5)
nRBC: 0 % (ref 0.0–0.2)

## 2020-09-10 LAB — CREATININE, SERUM
Creatinine, Ser: 1.18 mg/dL (ref 0.61–1.24)
GFR, Estimated: >60 mL/min (ref >60)

## 2020-09-10 SURGERY — ENDARTERECTOMY, FEMORAL
Anesthesia: General | Site: Groin | Laterality: Left

## 2020-09-10 MED ORDER — OXYCODONE-ACETAMINOPHEN 5-325 MG PO TABS: 1.0000 | ORAL_TABLET | ORAL | Status: DC | PRN

## 2020-09-10 MED ORDER — FENTANYL CITRATE (PF) 250 MCG/5ML IJ SOLN
INTRAMUSCULAR | Status: AC
  Filled 2020-09-10: qty 5

## 2020-09-10 MED ORDER — ALUM & MAG HYDROXIDE-SIMETH 200-200-20 MG/5ML PO SUSP: 15.0000 mL | ORAL | Status: DC | PRN

## 2020-09-10 MED ORDER — FENTANYL CITRATE (PF) 100 MCG/2ML IJ SOLN
INTRAMUSCULAR | Status: DC | PRN
  Administered 2020-09-10 (×4): 50 ug via INTRAVENOUS

## 2020-09-10 MED ORDER — PHENYLEPHRINE 40 MCG/ML (10ML) SYRINGE FOR IV PUSH (FOR BLOOD PRESSURE SUPPORT)
PREFILLED_SYRINGE | INTRAVENOUS | Status: AC
  Filled 2020-09-10: qty 10

## 2020-09-10 MED ORDER — ONDANSETRON HCL 4 MG/2ML IJ SOLN
INTRAMUSCULAR | Status: DC | PRN
  Administered 2020-09-10: 4 mg via INTRAVENOUS

## 2020-09-10 MED ORDER — PANTOPRAZOLE SODIUM 40 MG PO TBEC
40.0000 mg | DELAYED_RELEASE_TABLET | Freq: Every day | ORAL | Status: DC
  Administered 2020-09-11: 40 mg via ORAL
  Filled 2020-09-10: qty 1

## 2020-09-10 MED ORDER — GUAIFENESIN-DM 100-10 MG/5ML PO SYRP: 15.0000 mL | ORAL_SOLUTION | ORAL | Status: DC | PRN

## 2020-09-10 MED ORDER — POTASSIUM CHLORIDE CRYS ER 20 MEQ PO TBCR: 20.0000 meq | EXTENDED_RELEASE_TABLET | Freq: Every day | ORAL | Status: DC | PRN

## 2020-09-10 MED ORDER — DEXAMETHASONE SODIUM PHOSPHATE 10 MG/ML IJ SOLN
INTRAMUSCULAR | Status: DC | PRN
  Administered 2020-09-10: 4 mg via INTRAVENOUS

## 2020-09-10 MED ORDER — DOCUSATE SODIUM 100 MG PO CAPS
100.0000 mg | ORAL_CAPSULE | Freq: Every day | ORAL | Status: DC
  Administered 2020-09-11: 100 mg via ORAL
  Filled 2020-09-10: qty 1

## 2020-09-10 MED ORDER — CEFAZOLIN SODIUM-DEXTROSE 2-4 GM/100ML-% IV SOLN
2.0000 g | INTRAVENOUS | Status: AC
  Administered 2020-09-10: 2 g via INTRAVENOUS
  Filled 2020-09-10: qty 100

## 2020-09-10 MED ORDER — ORAL CARE MOUTH RINSE: 15.0000 mL | Freq: Once | OROMUCOSAL | Status: AC

## 2020-09-10 MED ORDER — EPHEDRINE SULFATE 50 MG/ML IJ SOLN
INTRAMUSCULAR | Status: DC | PRN
  Administered 2020-09-10 (×2): 5 mg via INTRAVENOUS

## 2020-09-10 MED ORDER — SUGAMMADEX SODIUM 200 MG/2ML IV SOLN
INTRAVENOUS | Status: DC | PRN
  Administered 2020-09-10: 200 mg via INTRAVENOUS

## 2020-09-10 MED ORDER — MORPHINE SULFATE (PF) 2 MG/ML IV SOLN: 2.0000 mg | INTRAVENOUS | Status: DC | PRN

## 2020-09-10 MED ORDER — PROTAMINE SULFATE 10 MG/ML IV SOLN
INTRAVENOUS | Status: DC | PRN
  Administered 2020-09-10: 100 mg via INTRAVENOUS

## 2020-09-10 MED ORDER — CHLORHEXIDINE GLUCONATE CLOTH 2 % EX PADS: 6.0000 | MEDICATED_PAD | Freq: Once | CUTANEOUS | Status: DC

## 2020-09-10 MED ORDER — SODIUM CHLORIDE 0.9 % IV SOLN: INTRAVENOUS | Status: DC

## 2020-09-10 MED ORDER — OXYCODONE HCL 5 MG/5ML PO SOLN: 5.0000 mg | Freq: Once | ORAL | Status: DC | PRN

## 2020-09-10 MED ORDER — OXYCODONE HCL 5 MG PO TABS: 5.0000 mg | ORAL_TABLET | Freq: Once | ORAL | Status: DC | PRN

## 2020-09-10 MED ORDER — LIDOCAINE HCL (PF) 2 % IJ SOLN
INTRAMUSCULAR | Status: AC
  Filled 2020-09-10: qty 5

## 2020-09-10 MED ORDER — POLYETHYLENE GLYCOL 3350 17 G PO PACK
17.0000 g | PACK | Freq: Every day | ORAL | Status: DC | PRN
  Administered 2020-09-10: 17 g via ORAL
  Filled 2020-09-10: qty 1

## 2020-09-10 MED ORDER — HEPARIN SODIUM (PORCINE) 5000 UNIT/ML IJ SOLN: 5000.0000 [IU] | Freq: Three times a day (TID) | INTRAMUSCULAR | Status: DC

## 2020-09-10 MED ORDER — SODIUM CHLORIDE 0.9 % IV SOLN
INTRAVENOUS | Status: AC
  Filled 2020-09-10: qty 1.2

## 2020-09-10 MED ORDER — HYDRALAZINE HCL 20 MG/ML IJ SOLN: 5.0000 mg | INTRAMUSCULAR | Status: DC | PRN

## 2020-09-10 MED ORDER — CHLORHEXIDINE GLUCONATE 0.12 % MT SOLN
15.0000 mL | Freq: Once | OROMUCOSAL | Status: AC
  Administered 2020-09-10: 15 mL via OROMUCOSAL
  Filled 2020-09-10: qty 15

## 2020-09-10 MED ORDER — PROPOFOL 10 MG/ML IV BOLUS
INTRAVENOUS | Status: AC
  Filled 2020-09-10: qty 20

## 2020-09-10 MED ORDER — FENTANYL CITRATE (PF) 100 MCG/2ML IJ SOLN
INTRAMUSCULAR | Status: AC
  Filled 2020-09-10: qty 2

## 2020-09-10 MED ORDER — SODIUM CHLORIDE 0.9 % IV SOLN
INTRAVENOUS | Status: DC | PRN
  Administered 2020-09-10: 500 mL

## 2020-09-10 MED ORDER — HEPARIN SODIUM (PORCINE) 1000 UNIT/ML IJ SOLN
INTRAMUSCULAR | Status: DC | PRN
  Administered 2020-09-10 (×2): 10000 [IU] via INTRAVENOUS

## 2020-09-10 MED ORDER — ROSUVASTATIN CALCIUM 20 MG PO TABS: 20.0000 mg | ORAL_TABLET | Freq: Every day | ORAL | Status: DC

## 2020-09-10 MED ORDER — ONDANSETRON HCL 4 MG/2ML IJ SOLN
INTRAMUSCULAR | Status: AC
  Filled 2020-09-10: qty 2

## 2020-09-10 MED ORDER — ACETAMINOPHEN 325 MG PO TABS
325.0000 mg | ORAL_TABLET | ORAL | Status: DC | PRN
  Administered 2020-09-10: 650 mg via ORAL
  Filled 2020-09-10: qty 2

## 2020-09-10 MED ORDER — MAGNESIUM SULFATE 2 GM/50ML IV SOLN: 2.0000 g | Freq: Every day | INTRAVENOUS | Status: DC | PRN

## 2020-09-10 MED ORDER — METOPROLOL TARTRATE 5 MG/5ML IV SOLN: 2.0000 mg | INTRAVENOUS | Status: DC | PRN

## 2020-09-10 MED ORDER — CEFAZOLIN SODIUM-DEXTROSE 2-4 GM/100ML-% IV SOLN
2.0000 g | Freq: Three times a day (TID) | INTRAVENOUS | Status: AC
  Administered 2020-09-10 – 2020-09-11 (×2): 2 g via INTRAVENOUS
  Filled 2020-09-10 (×2): qty 100

## 2020-09-10 MED ORDER — DEXAMETHASONE SODIUM PHOSPHATE 10 MG/ML IJ SOLN
INTRAMUSCULAR | Status: AC
  Filled 2020-09-10: qty 1

## 2020-09-10 MED ORDER — LIDOCAINE 2% (20 MG/ML) 5 ML SYRINGE
INTRAMUSCULAR | Status: DC | PRN
  Administered 2020-09-10: 40 mg via INTRAVENOUS

## 2020-09-10 MED ORDER — ONDANSETRON HCL 4 MG/2ML IJ SOLN: 4.0000 mg | Freq: Once | INTRAMUSCULAR | Status: DC | PRN

## 2020-09-10 MED ORDER — LACTATED RINGERS IV SOLN: INTRAVENOUS | Status: DC

## 2020-09-10 MED ORDER — FENTANYL CITRATE (PF) 100 MCG/2ML IJ SOLN
25.0000 ug | INTRAMUSCULAR | Status: DC | PRN
  Administered 2020-09-10 (×2): 50 ug via INTRAVENOUS

## 2020-09-10 MED ORDER — ACETAMINOPHEN 650 MG RE SUPP: 325.0000 mg | RECTAL | Status: DC | PRN

## 2020-09-10 MED ORDER — PHENOL 1.4 % MT LIQD: 1.0000 | OROMUCOSAL | Status: DC | PRN

## 2020-09-10 MED ORDER — SODIUM CHLORIDE 0.9 % IV SOLN: 500.0000 mL | Freq: Once | INTRAVENOUS | Status: DC | PRN

## 2020-09-10 MED ORDER — ROCURONIUM BROMIDE 10 MG/ML (PF) SYRINGE
PREFILLED_SYRINGE | INTRAVENOUS | Status: AC
  Filled 2020-09-10: qty 20

## 2020-09-10 MED ORDER — ROCURONIUM BROMIDE 10 MG/ML (PF) SYRINGE
PREFILLED_SYRINGE | INTRAVENOUS | Status: DC | PRN
  Administered 2020-09-10: 30 mg via INTRAVENOUS
  Administered 2020-09-10: 25 mg via INTRAVENOUS
  Administered 2020-09-10: 10 mg via INTRAVENOUS
  Administered 2020-09-10: 50 mg via INTRAVENOUS
  Administered 2020-09-10: 15 mg via INTRAVENOUS

## 2020-09-10 MED ORDER — LABETALOL HCL 5 MG/ML IV SOLN: 10.0000 mg | INTRAVENOUS | Status: DC | PRN

## 2020-09-10 MED ORDER — FLUTICASONE PROPIONATE 50 MCG/ACT NA SUSP
2.0000 | Freq: Every day | NASAL | Status: DC | PRN
  Filled 2020-09-10: qty 16

## 2020-09-10 MED ORDER — 0.9 % SODIUM CHLORIDE (POUR BTL) OPTIME
TOPICAL | Status: DC | PRN
  Administered 2020-09-10: 1000 mL

## 2020-09-10 MED ORDER — BISACODYL 10 MG RE SUPP: 10.0000 mg | Freq: Every day | RECTAL | Status: DC | PRN

## 2020-09-10 MED ORDER — EPHEDRINE 5 MG/ML INJ
INTRAVENOUS | Status: AC
  Filled 2020-09-10: qty 10

## 2020-09-10 MED ORDER — ASPIRIN EC 81 MG PO TBEC
81.0000 mg | DELAYED_RELEASE_TABLET | Freq: Every day | ORAL | Status: DC
  Administered 2020-09-11: 81 mg via ORAL
  Filled 2020-09-10: qty 1

## 2020-09-10 MED ORDER — ONDANSETRON HCL 4 MG/2ML IJ SOLN: 4.0000 mg | Freq: Four times a day (QID) | INTRAMUSCULAR | Status: DC | PRN

## 2020-09-10 MED ORDER — PHENYLEPHRINE HCL-NACL 10-0.9 MG/250ML-% IV SOLN
INTRAVENOUS | Status: DC | PRN
  Administered 2020-09-10: 35 ug/min via INTRAVENOUS

## 2020-09-10 MED ORDER — MOMETASONE FURO-FORMOTEROL FUM 200-5 MCG/ACT IN AERO
2.0000 | INHALATION_SPRAY | Freq: Two times a day (BID) | RESPIRATORY_TRACT | Status: DC
  Filled 2020-09-10: qty 8.8

## 2020-09-10 MED ORDER — PROPOFOL 10 MG/ML IV BOLUS
INTRAVENOUS | Status: DC | PRN
  Administered 2020-09-10: 130 mg via INTRAVENOUS

## 2020-09-10 MED ORDER — TRIAMTERENE-HCTZ 37.5-25 MG PO TABS: 0.5000 | ORAL_TABLET | Freq: Every day | ORAL | Status: DC | PRN

## 2020-09-10 SURGICAL SUPPLY — 38 items
ADH SKN CLS APL DERMABOND .7 (GAUZE/BANDAGES/DRESSINGS) ×1
AGENT HMST SPONGE THK3/8 (HEMOSTASIS)
CANISTER SUCT 3000ML PPV (MISCELLANEOUS) ×2 IMPLANT
CANNULA VESSEL 3MM 2 BLNT TIP (CANNULA) ×3 IMPLANT
CLIP VESOCCLUDE MED 24/CT (CLIP) ×2 IMPLANT
CLIP VESOCCLUDE SM WIDE 24/CT (CLIP) ×2 IMPLANT
COVER WAND RF STERILE (DRAPES) ×2 IMPLANT
DERMABOND ADVANCED (GAUZE/BANDAGES/DRESSINGS) ×1
DERMABOND ADVANCED .7 DNX12 (GAUZE/BANDAGES/DRESSINGS) ×1 IMPLANT
DRAIN HEMOVAC 1/8 X 5 (WOUND CARE) IMPLANT
ELECT REM PT RETURN 9FT ADLT (ELECTROSURGICAL) ×2
ELECTRODE REM PT RTRN 9FT ADLT (ELECTROSURGICAL) ×1 IMPLANT
EVACUATOR SILICONE 100CC (DRAIN) IMPLANT
GAUZE 4X4 16PLY RFD (DISPOSABLE) ×1 IMPLANT
GLOVE BIO SURGEON STRL SZ7.5 (GLOVE) ×2 IMPLANT
GOWN STRL REUS W/ TWL LRG LVL3 (GOWN DISPOSABLE) ×3 IMPLANT
GOWN STRL REUS W/TWL LRG LVL3 (GOWN DISPOSABLE) ×6
HEMOSTAT SPONGE AVITENE ULTRA (HEMOSTASIS) IMPLANT
KIT BASIN OR (CUSTOM PROCEDURE TRAY) ×2 IMPLANT
KIT TURNOVER KIT B (KITS) ×2 IMPLANT
LOOP VESSEL MINI RED (MISCELLANEOUS) ×1 IMPLANT
NS IRRIG 1000ML POUR BTL (IV SOLUTION) ×4 IMPLANT
PACK PERIPHERAL VASCULAR (CUSTOM PROCEDURE TRAY) ×2 IMPLANT
PAD ARMBOARD 7.5X6 YLW CONV (MISCELLANEOUS) ×4 IMPLANT
PATCH HEMASHIELD 8X150 (Vascular Products) ×1 IMPLANT
SPONGE INTESTINAL PEANUT (DISPOSABLE) ×1 IMPLANT
SUT ETHILON 3 0 PS 1 (SUTURE) IMPLANT
SUT PROLENE 5 0 C 1 24 (SUTURE) ×3 IMPLANT
SUT PROLENE 6 0 CC (SUTURE) ×3 IMPLANT
SUT SILK 2 0 SH CR/8 (SUTURE) ×1 IMPLANT
SUT VIC AB 2-0 SH 27 (SUTURE) ×2
SUT VIC AB 2-0 SH 27XBRD (SUTURE) ×1 IMPLANT
SUT VIC AB 3-0 SH 27 (SUTURE) ×4
SUT VIC AB 3-0 SH 27X BRD (SUTURE) ×1 IMPLANT
TOWEL GREEN STERILE (TOWEL DISPOSABLE) ×2 IMPLANT
TRAY FOLEY MTR SLVR 16FR STAT (SET/KITS/TRAYS/PACK) IMPLANT
UNDERPAD 30X36 HEAVY ABSORB (UNDERPADS AND DIAPERS) ×2 IMPLANT
WATER STERILE IRR 1000ML POUR (IV SOLUTION) ×2 IMPLANT

## 2020-09-10 NOTE — Anesthesia Postprocedure Evaluation (Signed)
Anesthesia Post Note  Patient: [facility] Armstrong  Procedure(s) Performed: ENDARTERECTOMY FEMORAL LEFT (Left Groin) PATCH ANGIOPLASTY OF LEFT FEMORAL ARTERY USING HEMASHIELD PLATINUM FINESSE PATCH (Left Groin)     Patient location during evaluation: PACU Anesthesia Type: General Level of consciousness: awake and alert Pain management: pain level controlled Vital Signs Assessment: post-procedure vital signs reviewed and stable Respiratory status: spontaneous breathing, nonlabored ventilation, respiratory function stable and patient connected to nasal cannula oxygen Cardiovascular status: blood pressure returned to baseline and stable Postop Assessment: no apparent nausea or vomiting Anesthetic complications: no   No complications documented.  Last Vitals:  Vitals:   09/10/20 1617 09/10/20 1647  BP: 115/74 (!) 141/83  Pulse: (!) 51 (!) 51  Resp: 10 12  Temp:    SpO2: 99% 99%    Last Pain:  Vitals:   09/10/20 1647  PainSc: 2                  Guyla Bless COKER

## 2020-09-10 NOTE — H&P (Signed)
Patient is an 85 year old male who returns for follow-up today.  He was last seen August 2021.  We are considering doing a left femoral endarterectomy.  He previously underwent right common femoral endarterectomy April 03, 2020.  His walking distance significantly improved after that.  He is still limited by his left leg.  He did have a TIA recently and neurology wished for [place] to defer his left femoral endarterectomy until October.  He has not had any further neurologic events.  His right groin incision is well-healed.  He is currently on aspirin.        Current Outpatient Medications on File Prior to Visit  Medication Sig Dispense Refill  . acetaminophen (TYLENOL) 325 MG tablet Take 325-650 mg by mouth every 6 (six) hours as needed for mild pain.    Marland Kitchen aspirin 81 MG tablet Take 81 mg by mouth daily.     Marland Kitchen EPINEPHrine (EPIPEN 2-PAK) 0.3 mg/0.3 mL IJ SOAJ injection Inject 0.3 mLs (0.3 mg total) into the muscle as needed for anaphylaxis. 1 each 0  . fluticasone (FLONASE) 50 MCG/ACT nasal spray Place 2 sprays into both nostrils daily as needed for allergies or rhinitis.    . Fluticasone-Salmeterol (ADVAIR DISKUS) 250-50 MCG/DOSE AEPB inhale 1 dose by mouth twice a day 60 each 5  . meclizine (ANTIVERT) 25 MG tablet Take 1 tablet (25 mg total) by mouth 3 (three) times daily as needed for dizziness. 30 tablet 0  . Multiple Vitamins-Minerals (PRESERVISION AREDS 2 PO) Take 1 capsule by mouth daily.    . rosuvastatin (CRESTOR) 20 MG tablet TAKE 1 TABLET(20 MG) BY MOUTH DAILY AT 6 PM 90 tablet 3   No current facility-administered medications on file prior to visit.       Past Medical History:  Diagnosis Date  . BRADYCARDIA 06/01/2007  . Cataract    bil cataracts removed  . COPD (chronic obstructive pulmonary disease) ([place])   . EMPHYSEMA 03/28/2008  . Emphysema of lung ([place])   . GERD (gastroesophageal reflux disease)   . HYPERGLYCEMIA 06/01/2007  . HYPERLIPIDEMIA 06/01/2007  . Hypertension    . HYPERTENSION 01/19/2009  . Peripheral arterial disease [facility])          Past Surgical History:  Procedure Laterality Date  . ABDOMINAL AORTOGRAM W/LOWER EXTREMITY N/A 03/05/2020   Procedure: ABDOMINAL AORTOGRAM W/  LOWER EXTREMITY;  Surgeon: Lorretta Harp, MD;  Location: [facility];  Service: Cardiovascular;  Laterality: N/A;  . CATARACT EXTRACTION, BILATERAL    . COLONOSCOPY    . ENDARTERECTOMY FEMORAL Right 04/03/2020   Procedure: Right Femoral Endarterectomy;  Surgeon: Elam Dutch, MD;  Location: [facility] OR;  Service: Vascular;  Laterality: Right;  . EYE SURGERY    . POLYPECTOMY    . TONSILLECTOMY AND ADENOIDECTOMY       Review of systems: He has no shortness of breath.  He has no chest pain.  Physical exam   There were no vitals filed for this visit.  Extremities: 2+ femoral pulses, well-healed groin incision right side, no palpable pedal pulses  Skin: No ulcer  Cardiac: Regular rate and rhythm  Abdomen: Soft nontender  Data: Patient had bilateral ABIs performed today.  Right side was biphasic with 1.04, left side was 0.68 also biphasic.  Assessment: Patient with improved function right leg but still with lifestyle limiting claudication left leg.  He wishes to proceed with left femoral endarterectomy at this point.  Plan: Left femoral endarterectomy scheduled for September 10, 2020.  Risk benefits possible complications of procedure details were discussed the patient today include not limited to bleeding infection possible vessel injury or limb loss.  Ruta Hinds, MD [facility] Office: [phone]

## 2020-09-10 NOTE — Op Note (Signed)
Procedure: Findings: Severe calcific occlusive disease extending from the proximal left common femoral artery to approximately 1.5 cm into the profunda  Operative details: After obtaining form consent, patient taken the operating.  The patient was placed supine position operating table.  After induction of general anesthesia and endotracheal intubation patient's left lower extremities prepped and draped in usual sterile fashion.  Longitudinal incision was made in the left groin over the area of the left common femoral artery.  Incision was carried on through subcutaneous tissues down the level left common femoral artery.  We will several areas of calcific plaque palpable within the artery.  Dissection was carried up underneath the inguinal ligament and the circumflex iliac branches were dissected free circumferentially and Vesseloops placed around these.  Dissection was carried down a few centimeters onto the superficial femoral artery which was calcified at the origin but then became softer.  The profunda in similar fashion was very calcified proximally but down at the second branch point became softer.  After Vesseloops were placed around all of these the patient was given 10,000 units intravenous heparin.  He was given 1 additional similar dose during the course of the case.  Longitudinal opening was made in the mid common femoral artery and extended up to the level of the circumflex iliac branches.  There was a large exophytic calcified plaque extending into the common femoral and into the origins of the profunda femoris and superficial femoral arteries.  Endarterectomy was begun in a suitable plane and good distal endpoints were obtained in the superficial femoral artery profunda femoris and common femoral artery.  There was good backbleeding from the profunda and superficial femoral artery.  All loose debris was removed.  Dacron patch was then brought up in the operative field and a patch angioplasty  performed extending from about 1.5 cm into the profunda femoris up to the proximal common femoral artery.  This prior to completion of the anastomosis it was for blood backbled and thoroughly flushed reanastomosed was secured clamps released there is pulsatile flow in the common femoral artery immediately and there was good Doppler flow in the superficial femoral and profunda femoris arteries.  Patient had good Doppler flow in the foot as well.  He has a chronic superficial femoral artery occlusion.  1 repair suture was placed.  The patient was given 100 mg of protamine.  Hemostasis was obtained.  Groin was then closed in multiple layers with running 2-0 and 3-0 Vicryl suture and a 4-0 Vicryl subcuticular stitch in the skin.  Dermabond was applied.  Patient tired procedure well and there were no complications.  Instrument sponge and needle counts were correct at the end of the case.  Patient was taken to recovery in stable condition.  Ruta Hinds, MD [facility] [place] Office: [phone]

## 2020-09-10 NOTE — Anesthesia Procedure Notes (Signed)
Procedure Name: Intubation Date/Time: 09/10/2020 9:41 AM Performed by: Inda Coke, CRNA Pre-anesthesia Checklist: Patient identified, Emergency Drugs available, Suction available and Patient being monitored Patient Re-evaluated:Patient Re-evaluated prior to induction Oxygen Delivery Method: Circle System Utilized Preoxygenation: Pre-oxygenation with 100% oxygen Induction Type: IV induction Ventilation: Mask ventilation without difficulty and Oral airway inserted - appropriate to patient size Laryngoscope Size: Mac and 4 Grade View: Grade II Tube type: Oral Number of attempts: 1 Airway Equipment and Method: Stylet and Oral airway Placement Confirmation: ETT inserted through vocal cords under direct vision,  positive ETCO2 and breath sounds checked- equal and bilateral Secured at: 22 cm Tube secured with: Tape Dental Injury: Teeth and Oropharynx as per pre-operative assessment

## 2020-09-10 NOTE — Discharge Instructions (Signed)
 [facility]  Discharge instructions  Lower Extremity Bypass Surgery  Please refer to the following instruction for your post-procedure care. Your surgeon or physician assistant will discuss any changes with you.  Activity  You are encouraged to walk as much as you can. You can slowly return to normal activities during the month after your surgery. Avoid strenuous activity and heavy lifting until your doctor tells you it's OK. Avoid activities such as vacuuming or swinging a golf club. Do not drive until your doctor give the OK and you are no longer taking prescription pain medications. It is also normal to have difficulty with sleep habits, eating and bowel movement after surgery. These will go away with time.  Bathing/Showering  Shower daily after you go home. Do not soak in a bathtub, hot tub, or swim until the incision heals completely.  Incision Care  Clean your incision with mild soap and water. Shower every day. Pat the area dry with a clean towel. You do not need a bandage unless otherwise instructed. Do not apply any ointments or creams to your incision. If you have open wounds you will be instructed how to care for them or a visiting nurse may be arranged for you. If you have staples or sutures along your incision they will be removed at your post-op appointment. You may have skin glue on your incision. Do not peel it off. It will come off on its own in about one week.  Wash the groin wound with soap and water daily and pat dry. (No tub bath-only shower)  Then put a dry gauze or washcloth in the groin to keep this area dry to help prevent wound infection.  Do this daily and as needed.  Do not use Vaseline or neosporin on your incisions.  Only use soap and water on your incisions and then protect and keep dry.  Diet  Resume your normal diet. There are no special food restrictions following this procedure. A low fat/ low cholesterol diet is  recommended for all patients with vascular disease. In order to heal from your surgery, it is CRITICAL to get adequate nutrition. Your body requires vitamins, minerals, and protein. Vegetables are the best source of vitamins and minerals. Vegetables also provide the perfect balance of protein. Processed food has little nutritional value, so try to avoid this.  Medications  Resume taking all your medications unless your doctor or physician assistant tells you not to. If your incision is causing pain, you may take over-the-counter pain relievers such as acetaminophen (Tylenol). If you were prescribed a stronger pain medication, please aware these medication can cause nausea and constipation. Prevent nausea by taking the medication with a snack or meal. Avoid constipation by drinking plenty of fluids and eating foods with high amount of fiber, such as fruits, vegetables, and grains. Take Colace 100 mg (an over-the-counter stool softener) twice a day as needed for constipation.  Do not take Tylenol if you are taking prescription pain medications.  Follow Up  Our office will schedule a follow up appointment 2-3 weeks following discharge.  Please call us immediately for any of the following conditions  Severe or worsening pain in your legs or feet while at rest or while walking Increase pain, redness, warmth, or drainage (pus) from your incision site(s) Fever of 101 degree or higher The swelling in your leg with the bypass suddenly worsens and becomes more painful than when you were in the hospital If you have   been instructed to feel your graft pulse then you should do so every day. If you can no longer feel this pulse, call the office immediately. Not all patients are given this instruction.  Leg swelling is common after leg bypass surgery.  The swelling should improve over a few months following surgery. To improve the swelling, you may elevate your legs above the level of your heart while you are  sitting or resting. Your surgeon or physician assistant may ask you to apply an ACE wrap or wear compression (TED) stockings to help to reduce swelling.  Reduce your risk of vascular disease  Stop smoking. If you would like help call [facility] at 1-800-QUIT-NOW (1-800-784-8669) or [place] at 336-586-4000.  Manage your cholesterol Maintain a desired weight Control your diabetes weight Control your diabetes Keep your blood pressure down  If you have any questions, please call the office at [phone]  

## 2020-09-10 NOTE — Progress Notes (Signed)
  Day of Surgery Note    Subjective:  Resting comfortably in pacu   Vitals:   09/10/20 1347 09/10/20 1402  BP: 136/71 136/77  Pulse: (!) 53 (!) 50  Resp: 12 11  Temp:    SpO2: 95% 97%    Incisions:   Left groin incision looks good without hematoma Extremities:  + doppler signals left DP/PT Cardiac:  regular Lungs:  Non labored    Assessment/Plan:  This is a 85 y.o. male who is s/p  Left femoral endarterectomy  -pt doing well in recovery with +doppler signals right DP/PT -if doing well tomorrow without issues, may be able to discharge home. -to [facility] later this afternoon.     Leontine Locket, PA-C 09/10/2020 2:21 PM [phone]

## 2020-09-10 NOTE — Transfer of Care (Signed)
Immediate Anesthesia Transfer of Care Note  Patient: PHILOPATEER STRINE  Procedure(s) Performed: ENDARTERECTOMY FEMORAL LEFT (Left Groin) PATCH ANGIOPLASTY OF LEFT FEMORAL ARTERY USING HEMASHIELD PLATINUM FINESSE PATCH (Left Groin)  Patient Location: PACU  Anesthesia Type:General  Level of Consciousness: awake and alert   Airway & Oxygen Therapy: Patient Spontanous Breathing and Patient connected to face mask oxygen  Post-op Assessment: Report given to RN, Post -op Vital signs reviewed and stable and Patient moving all extremities X 4  Post vital signs: Reviewed and stable  Last Vitals:  Vitals Value Taken Time  BP 143/93 09/10/20 1318  Temp    Pulse 68 09/10/20 1320  Resp 10 09/10/20 1320  SpO2 95 % 09/10/20 1320  Vitals shown include unvalidated device data.  Last Pain:  Vitals:   09/10/20 0729  PainSc: 0-No pain      Patients Stated Pain Goal: 3 (82/95/62 1308)  Complications: No complications documented.

## 2020-09-10 NOTE — Progress Notes (Signed)
Pt received from PACU to 4e04. Oriented x4. Left groin incision soft, clean and intact. Brisk doppler signals in right DP/PT. CHG bath complete. CCMD called, VSS. Call bell in reach. Will continue to monitor.  Arletta Bale, RN

## 2020-09-11 ENCOUNTER — Encounter (HOSPITAL_COMMUNITY): Payer: Self-pay | Admitting: Vascular Surgery

## 2020-09-11 LAB — LIPID PANEL
Cholesterol: 106 mg/dL (ref 0–200)
HDL: 31 mg/dL — ABNORMAL LOW (ref >40)
LDL Cholesterol: 38 mg/dL (ref 0–99)
Total CHOL/HDL Ratio: 3.4 RATIO
Triglycerides: 187 mg/dL — ABNORMAL HIGH (ref <150)
VLDL: 37 mg/dL (ref 0–40)

## 2020-09-11 LAB — BASIC METABOLIC PANEL
Anion gap: 12 (ref 5–15)
BUN: 23 mg/dL (ref 8–23)
CO2: 24 mmol/L (ref 22–32)
Calcium: 8.5 mg/dL — ABNORMAL LOW (ref 8.9–10.3)
Chloride: 101 mmol/L (ref 98–111)
Creatinine, Ser: 1.41 mg/dL — ABNORMAL HIGH (ref 0.61–1.24)
GFR, Estimated: 49 mL/min — ABNORMAL LOW (ref >60)
Glucose, Bld: 142 mg/dL — ABNORMAL HIGH (ref 70–99)
Potassium: 4 mmol/L (ref 3.5–5.1)
Sodium: 137 mmol/L (ref 135–145)

## 2020-09-11 LAB — CBC
HCT: 38.2 % — ABNORMAL LOW (ref 39.0–52.0)
Hemoglobin: 12.4 g/dL — ABNORMAL LOW (ref 13.0–17.0)
MCH: 29 pg (ref 26.0–34.0)
MCHC: 32.5 g/dL (ref 30.0–36.0)
MCV: 89.5 fL (ref 80.0–100.0)
Platelets: 181 10*3/uL (ref 150–400)
RBC: 4.27 MIL/uL (ref 4.22–5.81)
RDW: 13.3 % (ref 11.5–15.5)
WBC: 11.8 10*3/uL — ABNORMAL HIGH (ref 4.0–10.5)
nRBC: 0 % (ref 0.0–0.2)

## 2020-09-11 MED ORDER — HYDROCODONE-ACETAMINOPHEN 5-325 MG PO TABS: 1.0000 | ORAL_TABLET | Freq: Four times a day (QID) | ORAL | 0 refills | Status: AC | PRN

## 2020-09-11 MED ORDER — FLUTICASONE-SALMETEROL 250-50 MCG/DOSE IN AEPB: 1.0000 | INHALATION_SPRAY | RESPIRATORY_TRACT | Status: DC

## 2020-09-11 NOTE — Evaluation (Signed)
Physical Therapy Evaluation Patient Details Name: Bryan Armstrong MRN: [id number] DOB: Nov 12, 1934 Today's Date: 09/11/2020   History of Present Illness  85 year old male underwent right common femoral endarterectomy April 03, 2020. s/p TIA 7/21 and left femoral endarterectomy deferred. Pt now s/p L femoral endarterectomy 09/10/20. PMHx:  recent R femoral endarterectomy, bradycardia, COPD, emphysema, GERD, HLD, HTN, PAD,   Clinical Impression  PTA pt living with wife in multistory home with bed and bath downstairs and 3 steps to enter. Pt reports independence with mobility of <600 feet without AD, after 600 feet experienced claudication pain requiring him to sit to resolve. Pt independent in ADLs and iADLs. Pt is currently limited in safe mobility by mild instability and decreased endurance. Pt is mod I for bed mobility, and transfers and supervision for ambulation without AD and min guard for ascent/descent of 3 steps with rails. Pt educated on need for hourly mobility to improve strength and endurance at discharge and to ambulate with supervision initially. Pt's daughters will be present 24/7 initially. Pt is hopeful for d/c home this afternoon. PT will continue to follow acutely until d/c however pt will not need any addition PT services or DME at d/c.     Follow Up Recommendations No PT follow up;Supervision/Assistance - 24 hour    Equipment Recommendations  None recommended by PT (has RW at home if needed)       Precautions / Restrictions Precautions Precautions: Fall Restrictions Weight Bearing Restrictions: No      Mobility  Bed Mobility Overal bed mobility: Modified Independent             General bed mobility comments: HoB elevated and use of bedrails    Transfers Overall transfer level: Modified independent Equipment used: None             General transfer comment: good power up and self steadying in standing, no complaints of dizziness in standing    Ambulation/Gait Ambulation/Gait assistance: Supervision Gait Distance (Feet): 300 Feet Assistive device: None Gait Pattern/deviations: Step-through pattern;Decreased step length - right;Decreased step length - left Gait velocity: slowed Gait velocity interpretation: 1.31 - 2.62 ft/sec, indicative of limited community ambulator General Gait Details: supervision for mildly unsteady gait, walks on sides of feet for increased proprioception, pt reports more feeling than before in feet   Stairs Stairs: Yes Stairs assistance: Min guard Stair Management: One rail Right;Forwards;Sideways;Step to pattern Number of Stairs: 3 General stair comments: practiced ascent/descent of steps x2 once forward with mild instability, improved steadiness with sideways ascent/descent with both hands on rail       Balance Overall balance assessment: Mild deficits observed, not formally tested                                           Pertinent Vitals/Pain Pain Assessment: No/denies pain    Home Living Family/patient expects to be discharged to:: Private residence Living Arrangements: Spouse/significant other Available Help at Discharge: Family;Available 24 hours/day Type of Home: House Home Access: Stairs to enter Entrance Stairs-Rails: Right Entrance Stairs-Number of Steps: 3 Home Layout: Multi-level;Able to live on main level with bedroom/bathroom (doesn't go upstairs) Home Equipment: Shower seat;Walker - 2 wheels;Walker - 4 wheels      Prior Function Level of Independence: Independent         Comments: helps care for wife with MS who is currently staying with her son  until pt has regained independence, daughters will provide 24 hour care at d/c     Hand Dominance   Dominant Hand: Right    Extremity/Trunk Assessment   Upper Extremity Assessment Upper Extremity Assessment: Defer to OT evaluation    Lower Extremity Assessment Lower Extremity Assessment: RLE  deficits/detail;LLE deficits/detail RLE Deficits / Details: ROM WFL, strength grossly 4/5  LLE Deficits / Details: tenderness at incision site LLE Sensation: decreased light touch;decreased proprioception (in feet ) LLE Coordination: decreased fine motor    Cervical / Trunk Assessment Cervical / Trunk Assessment: Normal  Communication   Communication: No difficulties  Cognition Arousal/Alertness: Awake/alert Behavior During Therapy: WFL for tasks assessed/performed Overall Cognitive Status: Within Functional Limits for tasks assessed                                        General Comments General comments (skin integrity, edema, etc.): prior to ambulation BP 120/62, after ambulation 114/62    Exercises General Exercises - Lower Extremity Ankle Circles/Pumps: AROM;Both;15 reps;Seated Quad Sets: AROM;Both;10 reps;Seated Gluteal Sets: AROM;Both;10 reps;Seated   Assessment/Plan    PT Assessment Patient needs continued PT services  PT Problem List Decreased balance;Decreased activity tolerance       PT Treatment Interventions DME instruction;Gait training;Stair training;Functional mobility training;Therapeutic activities;Therapeutic exercise;Balance training;Cognitive remediation;Patient/family education    PT Goals (Current goals can be found in the Care Plan section)  Acute Rehab PT Goals Patient Stated Goal: get back to playing golf PT Goal Formulation: With patient Time For Goal Achievement: 09/25/20 Potential to Achieve Goals: Good    Frequency Min 3X/week    AM-PAC PT "6 Clicks" Mobility  Outcome Measure Help needed turning from your back to your side while in a flat bed without using bedrails?: None Help needed moving from lying on your back to sitting on the side of a flat bed without using bedrails?: None Help needed moving to and from a bed to a chair (including a wheelchair)?: None Help needed standing up from a chair using your arms (e.g.,  wheelchair or bedside chair)?: None Help needed to walk in hospital room?: None Help needed climbing 3-5 steps with a railing? : A Little 6 Click Score: 23    End of Session Equipment Utilized During Treatment: Gait belt Activity Tolerance: Patient tolerated treatment well Patient left: in chair;with call bell/phone within reach;with family/visitor present Nurse Communication: Mobility status PT Visit Diagnosis: Muscle weakness (generalized) (M62.81);Other abnormalities of gait and mobility (R26.89)    Time: 1610-9604 PT Time Calculation (min) (ACUTE ONLY): 33 min   Charges:   PT Evaluation $PT Eval Moderate Complexity: 1 Mod PT Treatments $Therapeutic Activity: 8-22 mins        Hera Celaya B. Migdalia Dk PT, DPT Acute Rehabilitation Services Pager [phone] Office [phone]   [place] 09/11/2020, 10:26 AM

## 2020-09-11 NOTE — Plan of Care (Signed)
  Problem: Education: Goal: Knowledge of General Education information will improve Description: Including pain rating scale, medication(s)/side effects and non-pharmacologic comfort measures Outcome: Adequate for Discharge   Problem: Health Behavior/Discharge Planning: Goal: Ability to manage health-related needs will improve Outcome: Adequate for Discharge   Problem: Clinical Measurements: Goal: Ability to maintain clinical measurements within normal limits will improve Outcome: Adequate for Discharge Goal: Will remain free from infection Outcome: Adequate for Discharge Goal: Diagnostic test results will improve Outcome: Adequate for Discharge Goal: Respiratory complications will improve Outcome: Adequate for Discharge Goal: Cardiovascular complication will be avoided Outcome: Adequate for Discharge   Problem: Activity: Goal: Risk for activity intolerance will decrease Outcome: Adequate for Discharge   Problem: Nutrition: Goal: Adequate nutrition will be maintained Outcome: Adequate for Discharge   Problem: Coping: Goal: Level of anxiety will decrease Outcome: Adequate for Discharge   Problem: Elimination: Goal: Will not experience complications related to bowel motility Outcome: Adequate for Discharge Goal: Will not experience complications related to urinary retention Outcome: Adequate for Discharge   Problem: Pain Managment: Goal: General experience of comfort will improve Outcome: Adequate for Discharge   Problem: Safety: Goal: Ability to remain free from injury will improve Outcome: Adequate for Discharge   Problem: Skin Integrity: Goal: Risk for impaired skin integrity will decrease Outcome: Adequate for Discharge   Problem: Acute Rehab PT Goals(only PT should resolve) Goal: Pt Will Ambulate Outcome: Adequate for Discharge Goal: Pt Will Go Up/Down Stairs Outcome: Adequate for Discharge Goal: Pt/caregiver will Perform Home Exercise Program Outcome:  Adequate for Discharge

## 2020-09-11 NOTE — Discharge Summary (Signed)
Discharge Summary     DAILY CRATE [birth year] 85 y.o. male  [id number]  Admission Date: 09/10/2020  Discharge Date: 09/11/2020  Physician: Elam Dutch, MD  Admission Diagnosis: PAD (peripheral artery disease) ([place]) [I73.9]  HPI:   This is a 85 y.o. male who returns for follow-up today. He was last seen August 2021. We are considering doing a left femoral endarterectomy. He previously underwent right common femoral endarterectomy April 03, 2020. His walking distance significantly improved after that. He is still limited by his left leg. He did have a TIA recently and neurology wished for [place] to defer his left femoral endarterectomy until October. He has not had any further neurologic events. His right groin incision is well-healed.He is currently on aspirin.  Hospital Course:  The patient was admitted to the hospital and taken to the operating room on 09/10/2020 and underwent: Left femoral endarterectomy    Findings: Severe calcific occlusive disease extending from the proximal left common femoral artery to approximately 1.5 cm into the profunda  The pt tolerated the procedure well and was transported to the PACU in good condition.   By POD 1, pt was doing well without complaints. He stated his left foot numbness was improved.  He had brisk doppler signals left DP/PT.   He was discharged home.  The remainder of the hospital course consisted of increasing mobilization and increasing intake of solids without difficulty.  CBC    Component Value Date/Time   WBC 11.8 (H) 09/11/2020 0134   RBC 4.27 09/11/2020 0134   HGB 12.4 (L) 09/11/2020 0134   HGB 14.2 02/28/2020 1107   HCT 38.2 (L) 09/11/2020 0134   HCT 42.6 02/28/2020 1107   PLT 181 09/11/2020 0134   PLT 201 02/28/2020 1107   MCV 89.5 09/11/2020 0134   MCV 89 02/28/2020 1107   MCH 29.0 09/11/2020 0134   MCHC 32.5 09/11/2020 0134   RDW 13.3 09/11/2020 0134   RDW 12.9 02/28/2020 1107   LYMPHSABS 1.5  04/17/2020 1801   MONOABS 0.5 04/17/2020 1801   EOSABS 0.5 04/17/2020 1801   BASOSABS 0.1 04/17/2020 1801    BMET    Component Value Date/Time   NA 137 09/11/2020 0134   NA 139 02/28/2020 1107   K 4.0 09/11/2020 0134   CL 101 09/11/2020 0134   CO2 24 09/11/2020 0134   GLUCOSE 142 (H) 09/11/2020 0134   BUN 23 09/11/2020 0134   BUN 19 02/28/2020 1107   CREATININE 1.41 (H) 09/11/2020 0134   CALCIUM 8.5 (L) 09/11/2020 0134   GFRNONAA 49 (L) 09/11/2020 0134   GFRAA >60 04/18/2020 0528     Discharge Instructions    Discharge patient   Complete by: As directed    Discharge home after pt has walked and has seen Dr. Oneida Alar   Discharge disposition: 01-Home or Self Care   Discharge patient date: 09/11/2020      Discharge Diagnosis:  PAD (peripheral artery disease) ([place]) [I73.9]  Secondary Diagnosis: Patient Active Problem List   Diagnosis Date Noted  . TIA (transient ischemic attack) 04/18/2020  . PAD (peripheral artery disease) ([place]) 04/03/2020  . Neck pain 12/30/2018  . GERD (gastroesophageal reflux disease) 09/28/2018  . Peripheral arterial disease ([place]) 06/15/2018  . Former smoker 06/15/2018  . History of colonic polyps 01/07/2018  . Nonspecific abnormal finding in stool contents 01/07/2018  . Allergic rhinitis 05/31/2013  . Urticaria 11/02/2011  . HTN (hypertension) 01/19/2009  . COPD, group A, by GOLD 2017 classification ([place]) 03/28/2008  .  Dyslipidemia 06/01/2007  . Bradycardia 06/01/2007  . HYPERGLYCEMIA 06/01/2007   Past Medical History:  Diagnosis Date  . BRADYCARDIA 06/01/2007  . Cataract    bil cataracts removed  . COPD (chronic obstructive pulmonary disease) ([place])   . EMPHYSEMA 03/28/2008  . Emphysema of lung ([place])   . GERD (gastroesophageal reflux disease)   . HYPERGLYCEMIA 06/01/2007  . HYPERLIPIDEMIA 06/01/2007  . Hypertension   . HYPERTENSION 01/19/2009  . Peripheral arterial disease ([place])   . TIA (transient ischemic attack)      Allergies as  of 09/11/2020      Reactions   Bee Venom Anaphylaxis   Yellow jackets   Meclizine    Fatigue       Medication List    TAKE these medications   APPLE CIDER VINEGAR PO Take 480 mg by mouth daily.   aspirin 81 MG tablet Take 81 mg by mouth daily.   EPINEPHrine 0.3 mg/0.3 mL Soaj injection Commonly known as: EpiPen 2-Pak Inject 0.3 mLs (0.3 mg total) into the muscle as needed for anaphylaxis.   fluticasone 50 MCG/ACT nasal spray Commonly known as: FLONASE Place 2 sprays into both nostrils daily as needed for allergies or rhinitis.   Fluticasone-Salmeterol 250-50 MCG/DOSE Aepb Commonly known as: Advair Diskus Inhale 1 puff into the lungs See admin instructions. Alternate days inhaling 1 puff once a day then 1 puff twice daily. May increase to 1 puff twice everyday during allergy season   HYDROcodone-acetaminophen 5-325 MG tablet Commonly known as: NORCO/VICODIN Take 1 tablet by mouth every 6 (six) hours as needed for moderate pain.   meclizine 25 MG tablet Commonly known as: ANTIVERT Take 1 tablet (25 mg total) by mouth 3 (three) times daily as needed for dizziness.   PRESERVISION AREDS 2 PO Take 1 capsule by mouth daily.   rosuvastatin 10 MG tablet Commonly known as: CRESTOR Take 20 mg by mouth daily. What changed: Another medication with the same name was removed. Continue taking this medication, and follow the directions you see here.   triamterene-hydrochlorothiazide 37.5-25 MG tablet Commonly known as: MAXZIDE-25 Take 0.5 tablets by mouth daily as needed (if sbp is above 135).       Discharge Instructions: [facility] Discharge instructions Lower Extremity Bypass Surgery  Please refer to the following instruction for your post-procedure care. Your surgeon or physician assistant will discuss any changes with you.  Activity  You are encouraged to walk as much as you can. You can slowly return to normal activities during the  month after your surgery. Avoid strenuous activity and heavy lifting until your doctor tells you it's OK. Avoid activities such as vacuuming or swinging a golf club. Do not drive until your doctor give the OK and you are no longer taking prescription pain medications. It is also normal to have difficulty with sleep habits, eating and bowel movement after surgery. These will go away with time.  Bathing/Showering  You may shower after you go home. Do not soak in a bathtub, hot tub, or swim until the incision heals completely.  Incision Care  Clean your incision with mild soap and water. Shower every day. Pat the area dry with a clean towel. You do not need a bandage unless otherwise instructed. Do not apply any ointments or creams to your incision. If you have open wounds you will be instructed how to care for them or a visiting nurse may be arranged for you. If you have staples or sutures  along your incision they will be removed at your post-op appointment. You may have skin glue on your incision. Do not peel it off. It will come off on its own in about one week.  Wash the groin wound with soap and water daily and pat dry. (No tub bath-only shower)  Then put a dry gauze or washcloth in the groin to keep this area dry to help prevent wound infection.  Do this daily and as needed.  Do not use Vaseline or neosporin on your incisions.  Only use soap and water on your incisions and then protect and keep dry.  Diet  Resume your normal diet. There are no special food restrictions following this procedure. A low fat/ low cholesterol diet is recommended for all patients with vascular disease. In order to heal from your surgery, it is CRITICAL to get adequate nutrition. Your body requires vitamins, minerals, and protein. Vegetables are the best source of vitamins and minerals. Vegetables also provide the perfect balance of protein. Processed food has little nutritional value, so try to avoid  this.  Medications  Resume taking all your medications unless your doctor or Physician Assistant tells you not to. If your incision is causing pain, you may take over-the-counter pain relievers such as acetaminophen (Tylenol). If you were prescribed a stronger pain medication, please aware these medication can cause nausea and constipation. Prevent nausea by taking the medication with a snack or meal. Avoid constipation by drinking plenty of fluids and eating foods with high amount of fiber, such as fruits, vegetables, and grains. Take Colace 100 mg (an over-the-counter stool softener) twice a day as needed for constipation.  Do not take Tylenol if you are taking prescription pain medications.  Follow Up  Our office will schedule a follow up appointment 2-3 weeks following discharge.  Please call us immediately for any of the following conditions  .Severe or worsening pain in your legs or feet while at rest or while walking .Increase pain, redness, warmth, or drainage (pus) from your incision site(s) . Fever of 101 degree or higher . The swelling in your leg with the bypass suddenly worsens and becomes more painful than when you were in the hospital . If you have been instructed to feel your graft pulse then you should do so every day. If you can no longer feel this pulse, call the office immediately. Not all patients are given this instruction. .  Leg swelling is common after leg bypass surgery.  The swelling should improve over a few months following surgery. To improve the swelling, you may elevate your legs above the level of your heart while you are sitting or resting. Your surgeon or physician assistant may ask you to apply an ACE wrap or wear compression (TED) stockings to help to reduce swelling.  Reduce your risk of vascular disease  Stop smoking. If you would like help call [facility] at 1-800-QUIT-NOW 914-609-3313) or [place] at (531)496-6616.  . Manage your  cholesterol . Maintain a desired weight . Control your diabetes weight . Control your diabetes . Keep your blood pressure down .  If you have any questions, please call the office at [phone]   Prescriptions given: 1. Norco #20 No Refill  Disposition: home  Patient's condition: is Good  Follow up: 1. Dr. Oneida Alar in 2 weeks   Leontine Locket, PA-C [facility] [phone] 09/11/2020  7:32 AM  - For VQI Registry use ---   Post-op:  Wound infection: No  Graft  infection: No  Transfusion: No    If yes, n/a units given New Arrhythmia: No Ipsilateral amputation: No, [ ]  Minor, [ ]  BKA, [ ]  AKA Discharge patency: [x ] Primary, [ ]  Primary assisted, [ ]  Secondary, [ ]  Occluded Patency judged by: [x ] Dopper only, [ ]  Palpable graft pulse, []  Palpable distal pulse, [ ]  ABI inc. > 0.15, [ ]  Duplex Discharge ABI: R not done, L  D/C Ambulatory Status: Ambulatory  Complications: MI: No, [ ]  Troponin only, [ ]  EKG or Clinical CHF: No Resp failure:No, [ ]  Pneumonia, [ ]  Ventilator Chg in renal function: No, [ ]  Inc. Cr > 0.5, [ ]  Temp. Dialysis,  [ ]  Permanent dialysis Stroke: No, [ ]  Minor, [ ]  Major Return to OR: No  Reason for return to OR: [ ]  Bleeding, [ ]  Infection, [ ]  Thrombosis, [ ]  Revision  Discharge medications: Statin use:  yes ASA use:  yes Plavix use:  no Beta blocker use: no CCB use:  No ACEI use:   no ARB use:  no Coumadin use: no

## 2020-09-11 NOTE — Progress Notes (Signed)
PHARMACIST LIPID MONITORING   Bryan Armstrong is a 85 y.o. male admitted on 09/10/2020 with PVD.  Pharmacy has been consulted to optimize lipid-lowering therapy with the indication of secondary prevention for clinical ASCVD.  Recent Labs:  Lipid Panel (last 6 months):   Lab Results  Component Value Date   CHOL 106 09/11/2020   TRIG 187 (H) 09/11/2020   HDL 31 (L) 09/11/2020   CHOLHDL 3.4 09/11/2020   VLDL 37 09/11/2020   LDLCALC 38 09/11/2020    Hepatic function panel (last 6 months):   Lab Results  Component Value Date   AST 21 09/05/2020   ALT 26 09/05/2020   ALKPHOS 57 09/05/2020   BILITOT 1.1 09/05/2020    SCr (since admission):   Serum creatinine: 1.41 mg/dL (H) 09/11/20 0134 Estimated creatinine clearance: 46.8 mL/min (A)  Current lipid-lowering therapy: crestor 20mg /day Previous lipid-lowering therapies (if applicable): none Documented or reported allergies or intolerances to lipid-lowering therapies (if applicable): none  Assessment:  No changes in lipid therapy as he is on a high intensity statin with LDL at goal  Recommendation per protocol:  Continue current lipid-lowering therapy.  Follow-up with:  Primary care provider - Vivi Barrack, MD  Follow-up labs after discharge:    lipid panel annually  Plan: -No changes in therapy  Hildred Laser, PharmD Clinical Pharmacist **Pharmacist phone directory can now be found on [email or web address] ([email or web address]).  Listed under [place].

## 2020-09-11 NOTE — Plan of Care (Signed)

## 2020-09-11 NOTE — Progress Notes (Addendum)
  Progress Note    09/11/2020 7:20 AM 1 Day Post-Op  Subjective:  No complaints; says the numbness in his foot is a little better since surgery.  Afebrile HR 50's-60's SB 751'Z-001'V systolic 49% RA  Vitals:   09/11/20 0300 09/11/20 0600  BP: 129/80 125/90  Pulse: (!) 59 (!) 57  Resp: 13 14  Temp: 98 F (36.7 C)   SpO2: 95% 94%    Physical Exam: Cardiac:  regular Lungs:  Non labored Incisions:  Left groin incision is clean and dry Extremities:  Brisk doppler signals left DP/PT; palpable right PT pulse   CBC    Component Value Date/Time   WBC 11.8 (H) 09/11/2020 0134   RBC 4.27 09/11/2020 0134   HGB 12.4 (L) 09/11/2020 0134   HGB 14.2 02/28/2020 1107   HCT 38.2 (L) 09/11/2020 0134   HCT 42.6 02/28/2020 1107   PLT 181 09/11/2020 0134   PLT 201 02/28/2020 1107   MCV 89.5 09/11/2020 0134   MCV 89 02/28/2020 1107   MCH 29.0 09/11/2020 0134   MCHC 32.5 09/11/2020 0134   RDW 13.3 09/11/2020 0134   RDW 12.9 02/28/2020 1107   LYMPHSABS 1.5 04/17/2020 1801   MONOABS 0.5 04/17/2020 1801   EOSABS 0.5 04/17/2020 1801   BASOSABS 0.1 04/17/2020 1801    BMET    Component Value Date/Time   NA 137 09/11/2020 0134   NA 139 02/28/2020 1107   K 4.0 09/11/2020 0134   CL 101 09/11/2020 0134   CO2 24 09/11/2020 0134   GLUCOSE 142 (H) 09/11/2020 0134   BUN 23 09/11/2020 0134   BUN 19 02/28/2020 1107   CREATININE 1.41 (H) 09/11/2020 0134   CALCIUM 8.5 (L) 09/11/2020 0134   GFRNONAA 49 (L) 09/11/2020 0134   GFRAA >60 04/18/2020 0528    INR    Component Value Date/Time   INR 1.0 09/05/2020 1326     Intake/Output Summary (Last 24 hours) at 09/11/2020 0720 Last data filed at 09/11/2020 0630 Gross per 24 hour  Intake 3011.2 ml  Output 1800 ml  Net 1211.2 ml     Assessment:  85 y.o. male is s/p:  Left femoral endarterectomy  1 Day Post-Op  Plan: -pt doing well this morning with brisk doppler signals left DP/PT; pre op numbness in left foot improved.   -pt  has voided and pain is controlled.  Needs to mobilize and home later today.  F/u with Dr. Oneida Alar in 2-3 weeks.  -PDMP reviewed   Leontine Locket, PA-C [facility] [phone] 09/11/2020 7:20 AM  Agree with above D/c home  Ruta Hinds, MD [facility] [place] Office: [phone]

## 2020-09-12 ENCOUNTER — Ambulatory Visit: Payer: Medicare Other | Admitting: Cardiovascular Disease

## 2020-09-12 DIAGNOSIS — Z48812 Encounter for surgical aftercare following surgery on the circulatory system: Secondary | ICD-10-CM | POA: Diagnosis not present

## 2020-09-12 DIAGNOSIS — I739 Peripheral vascular disease, unspecified: Secondary | ICD-10-CM | POA: Diagnosis not present

## 2020-09-12 DIAGNOSIS — I10 Essential (primary) hypertension: Secondary | ICD-10-CM | POA: Diagnosis not present

## 2020-09-12 DIAGNOSIS — E782 Mixed hyperlipidemia: Secondary | ICD-10-CM | POA: Diagnosis not present

## 2020-09-12 DIAGNOSIS — Z87891 Personal history of nicotine dependence: Secondary | ICD-10-CM | POA: Diagnosis not present

## 2020-09-12 DIAGNOSIS — Z8673 Personal history of transient ischemic attack (TIA), and cerebral infarction without residual deficits: Secondary | ICD-10-CM | POA: Diagnosis not present

## 2020-09-12 DIAGNOSIS — K219 Gastro-esophageal reflux disease without esophagitis: Secondary | ICD-10-CM | POA: Diagnosis not present

## 2020-09-12 DIAGNOSIS — J439 Emphysema, unspecified: Secondary | ICD-10-CM | POA: Diagnosis not present

## 2020-09-14 ENCOUNTER — Telehealth: Payer: Self-pay

## 2020-09-14 DIAGNOSIS — E782 Mixed hyperlipidemia: Secondary | ICD-10-CM | POA: Diagnosis not present

## 2020-09-14 DIAGNOSIS — K219 Gastro-esophageal reflux disease without esophagitis: Secondary | ICD-10-CM | POA: Diagnosis not present

## 2020-09-14 DIAGNOSIS — I10 Essential (primary) hypertension: Secondary | ICD-10-CM | POA: Diagnosis not present

## 2020-09-14 DIAGNOSIS — J439 Emphysema, unspecified: Secondary | ICD-10-CM | POA: Diagnosis not present

## 2020-09-14 DIAGNOSIS — Z87891 Personal history of nicotine dependence: Secondary | ICD-10-CM | POA: Diagnosis not present

## 2020-09-14 DIAGNOSIS — Z8673 Personal history of transient ischemic attack (TIA), and cerebral infarction without residual deficits: Secondary | ICD-10-CM | POA: Diagnosis not present

## 2020-09-14 DIAGNOSIS — I739 Peripheral vascular disease, unspecified: Secondary | ICD-10-CM | POA: Diagnosis not present

## 2020-09-14 DIAGNOSIS — Z48812 Encounter for surgical aftercare following surgery on the circulatory system: Secondary | ICD-10-CM | POA: Diagnosis not present

## 2020-09-14 NOTE — Telephone Encounter (Cosign Needed)
Transition Care Management Unsuccessful Follow-up Telephone Call  Date of discharge and from where:  09/11/20 [facility]  Attempts:  1st Attempt  Reason for unsuccessful TCM follow-up call:  Unable to leave message

## 2020-09-17 DIAGNOSIS — K219 Gastro-esophageal reflux disease without esophagitis: Secondary | ICD-10-CM | POA: Diagnosis not present

## 2020-09-17 DIAGNOSIS — Z48812 Encounter for surgical aftercare following surgery on the circulatory system: Secondary | ICD-10-CM | POA: Diagnosis not present

## 2020-09-17 DIAGNOSIS — Z87891 Personal history of nicotine dependence: Secondary | ICD-10-CM | POA: Diagnosis not present

## 2020-09-17 DIAGNOSIS — I739 Peripheral vascular disease, unspecified: Secondary | ICD-10-CM | POA: Diagnosis not present

## 2020-09-17 DIAGNOSIS — Z8673 Personal history of transient ischemic attack (TIA), and cerebral infarction without residual deficits: Secondary | ICD-10-CM | POA: Diagnosis not present

## 2020-09-17 DIAGNOSIS — E782 Mixed hyperlipidemia: Secondary | ICD-10-CM | POA: Diagnosis not present

## 2020-09-17 DIAGNOSIS — I10 Essential (primary) hypertension: Secondary | ICD-10-CM | POA: Diagnosis not present

## 2020-09-17 DIAGNOSIS — J439 Emphysema, unspecified: Secondary | ICD-10-CM | POA: Diagnosis not present

## 2020-09-18 DIAGNOSIS — Z8673 Personal history of transient ischemic attack (TIA), and cerebral infarction without residual deficits: Secondary | ICD-10-CM | POA: Diagnosis not present

## 2020-09-18 DIAGNOSIS — E782 Mixed hyperlipidemia: Secondary | ICD-10-CM | POA: Diagnosis not present

## 2020-09-18 DIAGNOSIS — Z87891 Personal history of nicotine dependence: Secondary | ICD-10-CM | POA: Diagnosis not present

## 2020-09-18 DIAGNOSIS — I739 Peripheral vascular disease, unspecified: Secondary | ICD-10-CM | POA: Diagnosis not present

## 2020-09-18 DIAGNOSIS — J439 Emphysema, unspecified: Secondary | ICD-10-CM | POA: Diagnosis not present

## 2020-09-18 DIAGNOSIS — K219 Gastro-esophageal reflux disease without esophagitis: Secondary | ICD-10-CM | POA: Diagnosis not present

## 2020-09-18 DIAGNOSIS — Z48812 Encounter for surgical aftercare following surgery on the circulatory system: Secondary | ICD-10-CM | POA: Diagnosis not present

## 2020-09-18 DIAGNOSIS — I10 Essential (primary) hypertension: Secondary | ICD-10-CM | POA: Diagnosis not present

## 2020-09-20 DIAGNOSIS — Z87891 Personal history of nicotine dependence: Secondary | ICD-10-CM | POA: Diagnosis not present

## 2020-09-20 DIAGNOSIS — I739 Peripheral vascular disease, unspecified: Secondary | ICD-10-CM | POA: Diagnosis not present

## 2020-09-20 DIAGNOSIS — K219 Gastro-esophageal reflux disease without esophagitis: Secondary | ICD-10-CM | POA: Diagnosis not present

## 2020-09-20 DIAGNOSIS — I10 Essential (primary) hypertension: Secondary | ICD-10-CM | POA: Diagnosis not present

## 2020-09-20 DIAGNOSIS — Z8673 Personal history of transient ischemic attack (TIA), and cerebral infarction without residual deficits: Secondary | ICD-10-CM | POA: Diagnosis not present

## 2020-09-20 DIAGNOSIS — J439 Emphysema, unspecified: Secondary | ICD-10-CM | POA: Diagnosis not present

## 2020-09-20 DIAGNOSIS — Z48812 Encounter for surgical aftercare following surgery on the circulatory system: Secondary | ICD-10-CM | POA: Diagnosis not present

## 2020-09-20 DIAGNOSIS — E782 Mixed hyperlipidemia: Secondary | ICD-10-CM | POA: Diagnosis not present

## 2020-09-24 ENCOUNTER — Ambulatory Visit: Payer: Medicare Other

## 2020-09-24 DIAGNOSIS — R42 Dizziness and giddiness: Secondary | ICD-10-CM | POA: Diagnosis not present

## 2020-09-24 DIAGNOSIS — H6122 Impacted cerumen, left ear: Secondary | ICD-10-CM | POA: Diagnosis not present

## 2020-09-25 DIAGNOSIS — Z8673 Personal history of transient ischemic attack (TIA), and cerebral infarction without residual deficits: Secondary | ICD-10-CM | POA: Diagnosis not present

## 2020-09-25 DIAGNOSIS — I10 Essential (primary) hypertension: Secondary | ICD-10-CM | POA: Diagnosis not present

## 2020-09-25 DIAGNOSIS — Z87891 Personal history of nicotine dependence: Secondary | ICD-10-CM | POA: Diagnosis not present

## 2020-09-25 DIAGNOSIS — Z48812 Encounter for surgical aftercare following surgery on the circulatory system: Secondary | ICD-10-CM | POA: Diagnosis not present

## 2020-09-25 DIAGNOSIS — K219 Gastro-esophageal reflux disease without esophagitis: Secondary | ICD-10-CM | POA: Diagnosis not present

## 2020-09-25 DIAGNOSIS — I739 Peripheral vascular disease, unspecified: Secondary | ICD-10-CM | POA: Diagnosis not present

## 2020-09-25 DIAGNOSIS — E782 Mixed hyperlipidemia: Secondary | ICD-10-CM | POA: Diagnosis not present

## 2020-09-25 DIAGNOSIS — J439 Emphysema, unspecified: Secondary | ICD-10-CM | POA: Diagnosis not present

## 2020-09-26 ENCOUNTER — Ambulatory Visit (INDEPENDENT_AMBULATORY_CARE_PROVIDER_SITE_OTHER): Payer: Self-pay | Admitting: Physician Assistant

## 2020-09-26 ENCOUNTER — Other Ambulatory Visit: Payer: Self-pay

## 2020-09-26 VITALS — BP 132/77 | HR 67 | Temp 98.1°F | Resp 20 | Ht 71.0 in | Wt 212.8 lb

## 2020-09-26 DIAGNOSIS — I779 Disorder of arteries and arterioles, unspecified: Secondary | ICD-10-CM

## 2020-09-26 NOTE — Progress Notes (Deleted)
  POST OPERATIVE OFFICE NOTE    CC:  F/u for surgery  HPI:  This is a 85 y.o. male who is s/p left femoral endarterectomy with Dacron patch on 09/10/2020.  He is compliant with aspirin and statin. Quit smoking in 2018. Hypertension well controlled on triamterene-hctz. He is not diabetic.  Allergies  Allergen Reactions  . Bee Venom Anaphylaxis    Yellow jackets  . Meclizine     Fatigue     Current Outpatient Medications  Medication Sig Dispense Refill  . APPLE CIDER VINEGAR PO Take 480 mg by mouth daily.    Marland Kitchen aspirin 81 MG tablet Take 81 mg by mouth daily.     Marland Kitchen EPINEPHrine (EPIPEN 2-PAK) 0.3 mg/0.3 mL IJ SOAJ injection Inject 0.3 mLs (0.3 mg total) into the muscle as needed for anaphylaxis. 1 each 0  . fluticasone (FLONASE) 50 MCG/ACT nasal spray Place 2 sprays into both nostrils daily as needed for allergies or rhinitis.    . Fluticasone-Salmeterol (ADVAIR DISKUS) 250-50 MCG/DOSE AEPB Inhale 1 puff into the lungs See admin instructions. Alternate days inhaling 1 puff once a day then 1 puff twice daily. May increase to 1 puff twice everyday during allergy season    . HYDROcodone-acetaminophen (NORCO/VICODIN) 5-325 MG tablet Take 1 tablet by mouth every 6 (six) hours as needed for moderate pain. 20 tablet 0  . meclizine (ANTIVERT) 25 MG tablet Take 1 tablet (25 mg total) by mouth 3 (three) times daily as needed for dizziness. (Patient not taking: Reported on 08/31/2020) 30 tablet 0  . Multiple Vitamins-Minerals (PRESERVISION AREDS 2 PO) Take 1 capsule by mouth daily.    . rosuvastatin (CRESTOR) 10 MG tablet Take 20 mg by mouth daily.    Marland Kitchen triamterene-hydrochlorothiazide (MAXZIDE-25) 37.5-25 MG tablet Take 0.5 tablets by mouth daily as needed (if sbp is above 135).     No current facility-administered medications for this visit.     ROS:  See HPI  There were no vitals taken for this visit.  Physical Exam:  General appearance:*** Cardiac:*** Respiratory:*** Incision:   *** Extremities:  *** Neuro: *** Abdomen:  ***  Pre-op ABIs ABI/TBIToday's ABIToday's TBIPrevious ABIPrevious TBI  +-------+-----------+-----------+------------+------------+  Right 1.04          0.89            +-------+-----------+-----------+------------+------------+  Left  0.68          0.63            +-------+-----------+-----------+------------+------------+  Assessment/Plan:  This is a 85 y.o. male who is s/p: left femoral endarterectomy with Dacron patch on 09/10/2020. This was due to left foot numbness.    He has follow-up appoinment on 10/11/2020.  Risa Grill, PA-C [facility] [phone]  Clinic MD:  Oneida Alar

## 2020-09-26 NOTE — Progress Notes (Signed)
  POST OPERATIVE OFFICE NOTE    CC:  F/u for surgery  HPI:  This is a 85 y.o. male who is s/p femoral endarterectomy secondary to lifestyle limiting left lower extremity claudication foot numbness.  This was performed on September 10, 2020.  He was discharged home in satisfactory condition.  Today, he feels well and has no residual numbness or pain in his left lower extremity.  He is compliant with aspirin 81 mg daily and rosuvastatin 20 mg daily He takes triamterene hydrochlorothiazide combination for hypertension He is not diabetic He is a former smoker and quit in 2018   Allergies  Allergen Reactions  . Bee Venom Anaphylaxis    Yellow jackets  . Meclizine     Fatigue     Current Outpatient Medications  Medication Sig Dispense Refill  . APPLE CIDER VINEGAR PO Take 480 mg by mouth daily.    Marland Kitchen aspirin 81 MG tablet Take 81 mg by mouth daily.     Marland Kitchen EPINEPHrine (EPIPEN 2-PAK) 0.3 mg/0.3 mL IJ SOAJ injection Inject 0.3 mLs (0.3 mg total) into the muscle as needed for anaphylaxis. 1 each 0  . fluticasone (FLONASE) 50 MCG/ACT nasal spray Place 2 sprays into both nostrils daily as needed for allergies or rhinitis.    . Fluticasone-Salmeterol (ADVAIR DISKUS) 250-50 MCG/DOSE AEPB Inhale 1 puff into the lungs See admin instructions. Alternate days inhaling 1 puff once a day then 1 puff twice daily. May increase to 1 puff twice everyday during allergy season    . HYDROcodone-acetaminophen (NORCO/VICODIN) 5-325 MG tablet Take 1 tablet by mouth every 6 (six) hours as needed for moderate pain. 20 tablet 0  . meclizine (ANTIVERT) 25 MG tablet Take 1 tablet (25 mg total) by mouth 3 (three) times daily as needed for dizziness. 30 tablet 0  . Multiple Vitamins-Minerals (PRESERVISION AREDS 2 PO) Take 1 capsule by mouth daily.    . rosuvastatin (CRESTOR) 20 MG tablet Take 20 mg by mouth at bedtime.    . triamterene-hydrochlorothiazide (MAXZIDE-25) 37.5-25 MG tablet Take 0.5 tablets by mouth daily as  needed (if sbp is above 135).     No current facility-administered medications for this visit.     ROS:  See HPI  BP 132/77 (BP Location: Left Arm, Patient Position: Sitting, Cuff Size: Large)   Pulse 67   Temp 98.1 F (36.7 C) (Temporal)   Resp 20   Ht 5\' 11"  (1.803 m)   Wt 212 lb 12.8 oz (96.5 kg)   SpO2 97%   BMI 29.68 kg/m   Physical Exam:  General appearance: Well-developed well-nourished male in no apparent distress Cardiac: Rate and rhythm are regular Respiratory: Nonlabored respirations Incision: Right groin incision is slightly macerated with an approximately 1 cm skin edge separation at the superior aspect of his incision.  There are no signs of infection Extremities: Lower extremities well perfused Neuro: Alert and oriented x4  Assessment/Plan:  This is a 85 y.o. male who is s/p: Left femoral endarterectomy.  Left groin incision is healing without signs of infection.  There are small area of skin edge separation.  Encouraged him to watch this area and to call for increasing drainage, redness, fever or chills.  Advised against heavy lifting or strenuous exercise including golfing until follow-up.  He will follow-up in the [facility] clinic with noninvasive studies.  Risa Grill, PA-C [facility] [phone]  Clinic MD:  Oneida Alar

## 2020-09-27 ENCOUNTER — Other Ambulatory Visit: Payer: Self-pay | Admitting: *Deleted

## 2020-09-27 DIAGNOSIS — E782 Mixed hyperlipidemia: Secondary | ICD-10-CM | POA: Diagnosis not present

## 2020-09-27 DIAGNOSIS — Z8673 Personal history of transient ischemic attack (TIA), and cerebral infarction without residual deficits: Secondary | ICD-10-CM | POA: Diagnosis not present

## 2020-09-27 DIAGNOSIS — Z48812 Encounter for surgical aftercare following surgery on the circulatory system: Secondary | ICD-10-CM | POA: Diagnosis not present

## 2020-09-27 DIAGNOSIS — K219 Gastro-esophageal reflux disease without esophagitis: Secondary | ICD-10-CM | POA: Diagnosis not present

## 2020-09-27 DIAGNOSIS — I739 Peripheral vascular disease, unspecified: Secondary | ICD-10-CM | POA: Diagnosis not present

## 2020-09-27 DIAGNOSIS — I10 Essential (primary) hypertension: Secondary | ICD-10-CM | POA: Diagnosis not present

## 2020-09-27 DIAGNOSIS — Z87891 Personal history of nicotine dependence: Secondary | ICD-10-CM | POA: Diagnosis not present

## 2020-09-27 DIAGNOSIS — J439 Emphysema, unspecified: Secondary | ICD-10-CM | POA: Diagnosis not present

## 2020-10-02 DIAGNOSIS — R42 Dizziness and giddiness: Secondary | ICD-10-CM | POA: Diagnosis not present

## 2020-10-02 DIAGNOSIS — H832X2 Labyrinthine dysfunction, left ear: Secondary | ICD-10-CM | POA: Diagnosis not present

## 2020-10-02 DIAGNOSIS — H903 Sensorineural hearing loss, bilateral: Secondary | ICD-10-CM | POA: Diagnosis not present

## 2020-10-03 ENCOUNTER — Other Ambulatory Visit: Payer: Self-pay

## 2020-10-03 ENCOUNTER — Telehealth: Payer: Self-pay

## 2020-10-03 NOTE — Telephone Encounter (Signed)
Patient called to ask about his incision. Saw patient in the office one week ago, and he had a small open area at the top of his incision. He denies any pain, redness, swelling, drainage or fever. Michela Pitcher it was a little indented at the previous area of skin separation. Advised patient to continue to monitor the site for s/s of infection, and keep appt on 12/30.

## 2020-10-11 ENCOUNTER — Other Ambulatory Visit: Payer: Self-pay

## 2020-10-11 ENCOUNTER — Ambulatory Visit (HOSPITAL_COMMUNITY)
Admission: RE | Admit: 2020-10-11 | Discharge: 2020-10-11 | Disposition: A | Discharge status: Home / Self Care | Payer: Medicare Other | Source: Ambulatory Visit | Attending: Vascular Surgery | Admitting: Vascular Surgery

## 2020-10-11 ENCOUNTER — Encounter (HOSPITAL_COMMUNITY): Payer: Medicare Other

## 2020-10-11 ENCOUNTER — Encounter: Payer: Medicare Other | Admitting: Vascular Surgery

## 2020-10-11 DIAGNOSIS — I739 Peripheral vascular disease, unspecified: Secondary | ICD-10-CM | POA: Insufficient documentation

## 2020-10-17 ENCOUNTER — Ambulatory Visit: Payer: Medicare Other | Attending: Otolaryngology

## 2020-10-17 ENCOUNTER — Other Ambulatory Visit: Payer: Self-pay

## 2020-10-17 DIAGNOSIS — R42 Dizziness and giddiness: Secondary | ICD-10-CM | POA: Diagnosis not present

## 2020-10-17 DIAGNOSIS — R2681 Unsteadiness on feet: Secondary | ICD-10-CM | POA: Diagnosis not present

## 2020-10-17 NOTE — Therapy (Signed)
[facility] [address], [place], [address] Phone: [phone]   Fax:  [phone]  Physical Therapy Evaluation  Patient Details  Name: Bryan Armstrong MRN: [id number] Date of Birth: 05-Apr-1935 Referring Provider (PT): Newman Pies, MD   Encounter Date: 10/17/2020   PT End of Session - 10/17/20 1359    Visit Number 1    Number of Visits 7    Date for PT Re-Evaluation 12/16/20   POC for 6 weeks, Cert for 60 days   Authorization Type [employer] Medicare (10th Visit PN)    Progress Note Due on Visit 10    PT Start Time 1315    PT Stop Time 1400    PT Time Calculation (min) 45 min    Activity Tolerance Patient tolerated treatment well    Behavior During Therapy [facility] for tasks assessed/performed           Past Medical History:  Diagnosis Date  . BRADYCARDIA 06/01/2007  . Cataract    bil cataracts removed  . COPD (chronic obstructive pulmonary disease) (HCC)   . EMPHYSEMA 03/28/2008  . Emphysema of lung (HCC)   . GERD (gastroesophageal reflux disease)   . HYPERGLYCEMIA 06/01/2007  . HYPERLIPIDEMIA 06/01/2007  . Hypertension   . HYPERTENSION 01/19/2009  . Peripheral arterial disease (HCC)   . TIA (transient ischemic attack)     Past Surgical History:  Procedure Laterality Date  . ABDOMINAL AORTOGRAM W/LOWER EXTREMITY N/A 03/05/2020   Procedure: ABDOMINAL AORTOGRAM W/  LOWER EXTREMITY;  Surgeon: Runell Gess, MD;  Location: [facility] INVASIVE CV LAB;  Service: Cardiovascular;  Laterality: N/A;  . CATARACT EXTRACTION, BILATERAL    . COLONOSCOPY    . ENDARTERECTOMY FEMORAL Right 04/03/2020   Procedure: Right Femoral Endarterectomy;  Surgeon: Sherren Kerns, MD;  Location: [facility] OR;  Service: Vascular;  Laterality: Right;  . ENDARTERECTOMY FEMORAL Left 09/10/2020   Procedure: ENDARTERECTOMY FEMORAL LEFT;  Surgeon: Sherren Kerns, MD;  Location: [facility] OR;  Service: Vascular;  Laterality: Left;  . EYE SURGERY     Bilaterl  cataracts  . PATCH ANGIOPLASTY Left 09/10/2020   Procedure: PATCH ANGIOPLASTY OF LEFT FEMORAL ARTERY USING HEMASHIELD PLATINUM FINESSE PATCH;  Surgeon: Sherren Kerns, MD;  Location: [facility] OR;  Service: Vascular;  Laterality: Left;  . POLYPECTOMY    . TONSILLECTOMY AND ADENOIDECTOMY      There were no vitals filed for this visit.    Subjective Assessment - 10/17/20 1313    Subjective Patient reports that he has intermittent episodes of dizziness. Reports that he can feel the dizziness coming on. Did have episode yesterday, most episodes last short period of time (few minutes). Has only had a few 3-4 episodes in the last few months. Reports feels as losing his balance to the R side. Patient reports episode of vertigo approx 10 years. No falls due to the dizziness, but has used wall to stable self at times. Saw ENT, and reported the    Pertinent History Recent R femoral endarterectomy, Bradycardia, COPD, Emphysema, GERD, HLD, HTN, PAD, TIA (June 2020)    Limitations Walking    Patient Stated Goals Get Rid of Sensation.    Currently in Pain? No/denies              [facility] PT Assessment - 10/17/20 0001      Assessment   Medical Diagnosis Dizziness    Referring Provider (PT) Newman Pies, MD    Onset Date/Surgical Date 10/15/20  Precautions   Precautions Fall      Balance Screen   Has the patient fallen in the past 6 months No    Has the patient had a decrease in activity level because of a fear of falling?  No    Is the patient reluctant to leave their home because of a fear of falling?  No      Home Environment   Living Environment Private residence    Living Arrangements Spouse/significant other    Available Help at Discharge Family    Type of [place]    Additional Comments Has stairs, but reports no difficulty with them.      Prior Function   Level of Independence Independent    Vocation Retired    Leisure Agricultural consultant   Overall Cognitive Status Within  Functional Limits for tasks assessed      Observation/Other Assessments   Focus on Therapeutic Outcomes (FOTO)  52%      Sensation   Light Touch Appears Intact      ROM / Strength   AROM / PROM / Strength AROM;Strength      AROM   Overall AROM  Within functional limits for tasks performed      Strength   Overall Strength Within functional limits for tasks performed      Transfers   Transfers Sit to Stand;Stand to Sit    Sit to Stand 6: Modified independent (Device/Increase time)    Stand to Sit 6: Modified independent (Device/Increase time)      Ambulation/Gait   Ambulation/Gait Yes    Ambulation/Gait Assistance 5: Supervision    Ambulation/Gait Assistance Details completed ambulation into/out of therapy session. wide BOS, mild imbalance noted with turns or sudden movements able to maintain balance without PT assistance    Ambulation Distance (Feet) 50 Feet    Assistive device None    Gait Pattern Step-through pattern;Wide base of support    Ambulation Surface Level;Indoor      High Level Balance   High Level Balance Comments Completed M-CTSIB: Patient able to complete situation 1-3 for full 30 seconds. Increased sway with vision removed. Situation 4: able to complete 7-8 seconds. Completed [employer]. No significant drift noted upon evaluation.                  Vestibular Assessment - 10/17/20 0001      Symptom Behavior   Subjective history of current problem Patient reports he started noticing the dizziness/episodes  in June 2021. Reports may have had episodes prior but unsure. Since then patient has had recurrent episodes of losing his equilibrium, lasts a few minutes and can sense the episode coming on. Patient reports that he has episodes of blurred vision during the episodes. Patient reports decreased vision in R eyes. Patient reports has had episodes during driving. Reports no significant HA/Migraine. Reports he has fullness in the ears during the  episodes, denies tinnitus.    Type of Dizziness  Blurred vision;Imbalance;"Funny feeling in head";Lightheadedness;Unsteady with head/body turns    Frequency of Dizziness intermittent episodes    Duration of Dizziness few minutes (10-20)    Symptom Nature Spontaneous    Aggravating Factors Comment   patient reports no known aggravating factors   Relieving Factors Rest;Head stationary    Progression of Symptoms Better      Oculomotor Exam   Oculomotor Alignment Normal    Ocular ROM WNL    Spontaneous Absent  Gaze-induced  Absent    Smooth Pursuits Intact    Saccades Intact      Oculomotor Exam-Fixation Suppressed    Left Head Impulse unable to test; patient unable to relax    Right Head Impulse unable to test; patient unable to relax      Vestibulo-Ocular Reflex   VOR 1 Head Only (x 1 viewing) mild symptoms    VOR Cancellation Normal      Visual Acuity   Static 9    Dynamic 7   mild bluriness             Objective measurements completed on examination: See above findings.               PT Education - 10/17/20 1417    Education Details educated on POC/evaluation findings    Person(s) Educated Patient    Methods Explanation    Comprehension Verbalized understanding            PT Short Term Goals - 10/17/20 1507      PT SHORT TERM GOAL #1   Title Patient will be independent with initial Balance/vestibular HEP (all STGs Due: 11/07/20)    Baseline no HEP established    Time 3    Period Weeks    Status New    Target Date 11/07/20             PT Long Term Goals - 10/17/20 1508      PT LONG TERM GOAL #1   Title Patient will be independent with final vestibular/balance HEP (all LTGS due: 11/28/20)    Baseline no HEP established    Time 6    Period Weeks    Status New    Target Date 11/28/20      PT LONG TERM GOAL #2   Title Patient will be able to hold situation 4 of M-CTSIB for >/= 20 seconds to demonstrate improved vestibular input for  balance    Baseline 7-8 secs    Time 6    Period Weeks    Status New      PT LONG TERM GOAL #3   Title LTG to be set for FGA as appropriate    Baseline TBA    Time 6    Period Weeks    Status New      PT LONG TERM GOAL #4   Title Patient will demonstrate </= 1 line difference on DVA to demonstrate improved VOR function    Baseline 2 line difference    Time 6    Period Weeks    Status New                  Plan - 10/17/20 1502    Clinical Impression Statement Patient is an 86 y.o. male that was referred to neuro OPPT services for dizziness. Patient PMH is significant for the following: Recent R femoral endarterectomy, Bradycardia, COPD, Emphysema, GERD, HLD, HTN, PAD, TIA. Upon evaluation patient presents with the following impairments: dizziness, impaired VOR reflex, impaired balance, and increased fall risk due to dysquilibrium/imbalance. ENT reports that dizziness secondary to left hypofunction, and M-CTSIB results dmeonstrate decreased vestibular input for balance. Unable to rule out central vestibular dysfunction as well due to recent TIA. Further dynamic gait assessment will be completed at next visit. Patient will benefit from skilled PT services to address impairments, and reduce fall risk.    Personal Factors and Comorbidities Age;Comorbidity 3+    Comorbidities Recent R femoral endarterectomy, Bradycardia,  COPD, Emphysema, GERD, HLD, HTN, PAD, TIA    Examination-Activity Limitations Locomotion Level    Examination-Participation Restrictions Driving;Community Activity    Stability/Clinical Decision Making Stable/Uncomplicated    Clinical Decision Making Low    Rehab Potential Fair    PT Frequency 1x / week    PT Duration 6 weeks    PT Treatment/Interventions ADLs/Self Care Home Management;Canalith Repostioning;Cryotherapy;Moist Heat;DME Instruction;Gait training;Stair training;Functional mobility training;Therapeutic activities;Therapeutic exercise;Balance  training;Neuromuscular re-education;Patient/family education;Manual techniques;Passive range of motion;Vestibular    PT Next Visit Plan Complete FGA and set LTG as appropriate. Potential for SOT? Initiate Balance/Vestibular HEP (VOR X 1, Corner Balance)    Consulted and Agree with Plan of Care Patient           Patient will benefit from skilled therapeutic intervention in order to improve the following deficits and impairments:  Decreased balance,Dizziness,Decreased mobility,Decreased safety awareness,Decreased activity tolerance  Visit Diagnosis: Dizziness and giddiness  Unsteadiness on feet     Problem List Patient Active Problem List   Diagnosis Date Noted  . TIA (transient ischemic attack) 04/18/2020  . PAD (peripheral artery disease) ([place]) 04/03/2020  . Neck pain 12/30/2018  . GERD (gastroesophageal reflux disease) 09/28/2018  . Peripheral arterial disease ([place]) 06/15/2018  . Former smoker 06/15/2018  . History of colonic polyps 01/07/2018  . Nonspecific abnormal finding in stool contents 01/07/2018  . Allergic rhinitis 05/31/2013  . Urticaria 11/02/2011  . HTN (hypertension) 01/19/2009  . COPD, group A, by GOLD 2017 classification ([place]) 03/28/2008  . Dyslipidemia 06/01/2007  . Bradycardia 06/01/2007  . HYPERGLYCEMIA 06/01/2007    Jones Bales, PT, DPT 10/17/2020, 3:16 PM  [facility] [address] [place], [place], [address] Phone: [phone]   Fax:  [phone]  Name: ESROM SOY MRN: [id number] Date of Birth: [birth year]

## 2020-10-22 ENCOUNTER — Encounter (HOSPITAL_COMMUNITY): Payer: Medicare Other

## 2020-10-24 ENCOUNTER — Ambulatory Visit: Payer: Medicare Other

## 2020-10-24 ENCOUNTER — Other Ambulatory Visit: Payer: Self-pay

## 2020-10-24 VITALS — BP 93/56 | HR 64

## 2020-10-24 DIAGNOSIS — R42 Dizziness and giddiness: Secondary | ICD-10-CM | POA: Diagnosis not present

## 2020-10-24 DIAGNOSIS — R2681 Unsteadiness on feet: Secondary | ICD-10-CM | POA: Diagnosis not present

## 2020-10-24 NOTE — Patient Instructions (Signed)
Gaze Stabilization: Sitting    Keeping eyes on target on wall \\_10N  feet away, tilt head down 15-30 and move head side to side for 45 seconds. Repeat while moving head up and down for 45 seconds. Do 2-3 sessions per day. Copyright  VHI. All rights reserved.

## 2020-10-24 NOTE — Therapy (Signed)
[facility] [address] [place], [place], [address] Phone: [phone]   Fax:  [phone]  Physical Therapy Treatment  Patient Details  Name: Bryan Armstrong MRN: [id number] Date of Birth: 16-Jan-1935 Referring Provider (PT): Newman Pies, MD   Encounter Date: 10/24/2020   PT End of Session - 10/24/20 1450    Visit Number 2    Number of Visits 7    Date for PT Re-Evaluation 12/16/20   POC for 6 weeks, Cert for 60 days   Authorization Type U[employer] Medicare (10th Visit PN)    Progress Note Due on Visit 10    PT Start Time 1448    PT Stop Time 1532    PT Time Calculation (min) 44 min    Equipment Utilized During Treatment Gait belt    Activity Tolerance Patient tolerated treatment well    Behavior During Therapy [facility] for tasks assessed/performed           Past Medical History:  Diagnosis Date  . BRADYCARDIA 06/01/2007  . Cataract    bil cataracts removed  . COPD (chronic obstructive pulmonary disease) (HCC)   . EMPHYSEMA 03/28/2008  . Emphysema of lung (HCC)   . GERD (gastroesophageal reflux disease)   . HYPERGLYCEMIA 06/01/2007  . HYPERLIPIDEMIA 06/01/2007  . Hypertension   . HYPERTENSION 01/19/2009  . Peripheral arterial disease (HCC)   . TIA (transient ischemic attack)     Past Surgical History:  Procedure Laterality Date  . ABDOMINAL AORTOGRAM W/LOWER EXTREMITY N/A 03/05/2020   Procedure: ABDOMINAL AORTOGRAM W/  LOWER EXTREMITY;  Surgeon: Runell Gess, MD;  [facility]: [facility] INVASIVE CV LAB;  Service: Cardiovascular;  Laterality: N/A;  . CATARACT EXTRACTION, BILATERAL    . COLONOSCOPY    . ENDARTERECTOMY FEMORAL Right 04/03/2020   Procedure: Right Femoral Endarterectomy;  Surgeon: Sherren Kerns, MD;  Location: [facility] OR;  Service: Vascular;  Laterality: Right;  . ENDARTERECTOMY FEMORAL Left 09/10/2020   Procedure: ENDARTERECTOMY FEMORAL LEFT;  Surgeon: Sherren Kerns, MD;  Location: [facility] OR;  Service: Vascular;   Laterality: Left;  . EYE SURGERY     Bilaterl cataracts  . PATCH ANGIOPLASTY Left 09/10/2020   Procedure: PATCH ANGIOPLASTY OF LEFT FEMORAL ARTERY USING HEMASHIELD PLATINUM FINESSE PATCH;  Surgeon: Sherren Kerns, MD;  Location: [facility] OR;  Service: Vascular;  Laterality: Left;  . POLYPECTOMY    . TONSILLECTOMY AND ADENOIDECTOMY      Vitals:   10/24/20 1455  BP: (!) 93/56  Pulse: 64     Subjective Assessment - 10/24/20 1451    Subjective No new changes. Patient reports BP has been lower today, and was feeling funny headed feeling. No falls. No pain.    Pertinent History Recent R femoral endarterectomy, Bradycardia, COPD, Emphysema, GERD, HLD, HTN, PAD, TIA (June 2020)    Limitations Walking    Patient Stated Goals Get Rid of Sensation.    Currently in Pain? No/denies              [facility] PT Assessment - 10/24/20 1524      Functional Gait  Assessment   Gait assessed  Yes    Gait Level Surface Walks 20 ft in less than 7 sec but greater than 5.5 sec, uses assistive device, slower speed, mild gait deviations, or deviates 6-10 in outside of the 12 in walkway width.    Change in Gait Speed Able to change speed, demonstrates mild gait deviations, deviates 6-10 in outside of the 12  in walkway width, or no gait deviations, unable to achieve a major change in velocity, or uses a change in velocity, or uses an assistive device.    Gait with Horizontal Head Turns Performs head turns smoothly with slight change in gait velocity (eg, minor disruption to smooth gait path), deviates 6-10 in outside 12 in walkway width, or uses an assistive device.    Gait with Vertical Head Turns Performs task with slight change in gait velocity (eg, minor disruption to smooth gait path), deviates 6 - 10 in outside 12 in walkway width or uses assistive device    Gait and Pivot Turn Pivot turns safely in greater than 3 sec and stops with no loss of balance, or pivot turns safely within 3 sec and stops with mild  imbalance, requires small steps to catch balance.    Step Over Obstacle Is able to step over 2 stacked shoe boxes taped together (9 in total height) without changing gait speed. No evidence of imbalance.    Gait with Narrow Base of Support Ambulates less than 4 steps heel to toe or cannot perform without assistance.    Gait with Eyes Closed Walks 20 ft, uses assistive device, slower speed, mild gait deviations, deviates 6-10 in outside 12 in walkway width. Ambulates 20 ft in less than 9 sec but greater than 7 sec.    Ambulating Backwards Walks 20 ft, slow speed, abnormal gait pattern, evidence for imbalance, deviates 10-15 in outside 12 in walkway width.    Steps Alternating feet, must use rail.    Total Score 18    FGA comment: 18/30              [facility] Adult PT Treatment/Exercise - 10/24/20 0001      Transfers   Transfers Sit to Stand;Stand to Sit    Sit to Stand 6: Modified independent (Device/Increase time)    Stand to Sit 6: Modified independent (Device/Increase time)      Ambulation/Gait   Ambulation/Gait Yes    Ambulation/Gait Assistance 5: Supervision    Ambulation/Gait Assistance Details throughout therapy gym with activites    Assistive device None    Gait Pattern Step-through pattern;Wide base of support    Ambulation Surface Level;Indoor    Stairs Yes    Stairs Assistance 5: Supervision    Stairs Assistance Details (indicate cue type and reason) with completion of FGA. Patient completed step to pattern, patient reporting able to complete reciprocal but due to pain in knee completes with both feet to one step.    Stair Management Technique One rail Right;Step to pattern;Forwards    Number of Stairs 8    Height of Stairs 6      Self-Care   Self-Care Other Self-Care Comments    Other Self-Care Comments  Due to patient reporting continued visual changes, PT educating for need to be be further evaluated by an eye doctor. PT also educating on adequate water intake due to low  BP today at session.      Therapeutic Activites    Therapeutic Activities Other Therapeutic Activities    Other Therapeutic Activities PT educating on proper completion of VOR exercise at home with start of vestibular HEP           Vestibular Treatment/Exercise - 10/24/20 0001      Vestibular Treatment/Exercise   Vestibular Treatment Provided Gaze    Gaze Exercises X1 Viewing Horizontal;X1 Viewing Vertical      X1 Viewing Horizontal   Foot Position seated  Reps 4    Comments completed 2 x 30 seconds, then progressed and completed 2 x 45 seconds. blurry vision, minimal dizziness      X1 Viewing Vertical   Foot Position seated    Reps 4    Comments completed 2 x 30 seconds, then progressed and completed 2 x 45 seconds. blurry vision, minimal dizziness                 PT Education - 10/24/20 1644    Education Details VOR x 1 (seated)    Person(s) Educated Patient    Methods Explanation;Demonstration;Handout    Comprehension Verbalized understanding;Returned demonstration            PT Short Term Goals - 10/17/20 1507      PT SHORT TERM GOAL #1   Title Patient will be independent with initial Balance/vestibular HEP (all STGs Due: 11/07/20)    Baseline no HEP established    Time 3    Period Weeks    Status New    Target Date 11/07/20             PT Long Term Goals - 10/24/20 1648      PT LONG TERM GOAL #1   Title Patient will be independent with final vestibular/balance HEP (all LTGS due: 11/28/20)    Baseline no HEP established    Time 6    Period Weeks    Status New      PT LONG TERM GOAL #2   Title Patient will be able to hold situation 4 of M-CTSIB for >/= 20 seconds to demonstrate improved vestibular input for balance    Baseline 7-8 secs    Time 6    Period Weeks    Status New      PT LONG TERM GOAL #3   Title Patient will improve FGA to >/= 22/30 to demonstrate improved balance and reduced fall risk    Baseline 18/30    Time 6    Period  Weeks    Status New      PT LONG TERM GOAL #4   Title Patient will demonstrate </= 1 line difference on DVA to demonstrate improved VOR function    Baseline 2 line difference    Time 6    Period Weeks    Status New                 Plan - 10/24/20 1644    Clinical Impression Statement Today's skilled PT session included further balance assessment with completion of FGA, patient scoring 18/30 demonstrating increased risk for falls at this time. Intermittent rest breaks throughout session needed to assess BP due to low BP today during session. PT educating on adequate water intake and to conitnue monitor at home. Initiated Vestibular HEP focused on VOR x 1 with patient having mild symptoms overall. Will continue to progress toward all LTGs.    Personal Factors and Comorbidities Age;Comorbidity 3+    Comorbidities Recent R femoral endarterectomy, Bradycardia, COPD, Emphysema, GERD, HLD, HTN, PAD, TIA    Examination-Activity Limitations Locomotion Level    Examination-Participation Restrictions Driving;Community Activity    Stability/Clinical Decision Making Stable/Uncomplicated    Rehab Potential Fair    PT Frequency 1x / week    PT Duration 6 weeks    PT Treatment/Interventions ADLs/Self Care Home Management;Canalith Repostioning;Cryotherapy;Moist Heat;DME Instruction;Gait training;Stair training;Functional mobility training;Therapeutic activities;Therapeutic exercise;Balance training;Neuromuscular re-education;Patient/family education;Manual techniques;Passive range of motion;Vestibular    PT Next Visit Plan How was VOR?  Progress as tolerated. Potential for SOT? Initiate Balance HEP    Consulted and Agree with Plan of Care Patient           Patient will benefit from skilled therapeutic intervention in order to improve the following deficits and impairments:  Decreased balance,Dizziness,Decreased mobility,Decreased safety awareness,Decreased activity tolerance  Visit  Diagnosis: Dizziness and giddiness  Unsteadiness on feet     Problem List Patient Active Problem List   Diagnosis Date Noted  . TIA (transient ischemic attack) 04/18/2020  . PAD (peripheral artery disease) ([place]) 04/03/2020  . Neck pain 12/30/2018  . GERD (gastroesophageal reflux disease) 09/28/2018  . Peripheral arterial disease ([place]) 06/15/2018  . Former smoker 06/15/2018  . History of colonic polyps 01/07/2018  . Nonspecific abnormal finding in stool contents 01/07/2018  . Allergic rhinitis 05/31/2013  . Urticaria 11/02/2011  . HTN (hypertension) 01/19/2009  . COPD, group A, by GOLD 2017 classification ([place]) 03/28/2008  . Dyslipidemia 06/01/2007  . Bradycardia 06/01/2007  . HYPERGLYCEMIA 06/01/2007    Jones Bales, PT, DPT 10/24/2020, 4:48 PM  [place] [address] [place], [place], [address] Phone: [phone]   Fax:  [phone]  Name: CADIN LUKA MRN: [id number] Date of Birth: 02-Feb-1935

## 2020-10-31 ENCOUNTER — Ambulatory Visit: Payer: Medicare Other

## 2020-10-31 ENCOUNTER — Other Ambulatory Visit: Payer: Self-pay

## 2020-10-31 VITALS — BP 153/82 | HR 61

## 2020-10-31 DIAGNOSIS — R2681 Unsteadiness on feet: Secondary | ICD-10-CM | POA: Diagnosis not present

## 2020-10-31 DIAGNOSIS — R42 Dizziness and giddiness: Secondary | ICD-10-CM

## 2020-10-31 NOTE — Patient Instructions (Addendum)
Gaze Stabilization: Standing Feet Apart    Feet shoulder width apart, keeping eyes on target on wall 3-4 feet away, tilt head down 15-30 and move head side to side for 60 seconds. Repeat while moving head up and down for 60 seconds. Do 2-3 sessions per day. Repeat using target on pattern background.   Access Code: [email or web address] URL: [email or web address] Date: 10/31/2020 Prepared by: Baldomero Lamy  Exercises Romberg Stance Eyes Closed on Foam Pad - 1 x daily - 5 x weekly - 1 sets - 3 reps - 30 hold Romberg Stance with Head Nods on Foam Pad - 1 x daily - 5 x weekly - 2 sets - 10 reps Romberg Stance on Foam Pad with Head Rotation - 1 x daily - 5 x weekly - 2 sets - 10 reps Tandem Stance - 1 x daily - 5 x weekly - 1 sets - 3 reps - 30 hold Tandem Walking with Head Rotations - 1 x daily - 5 x weekly - 1 sets - 5 reps  Copyright  VHI. All rights reserved.

## 2020-10-31 NOTE — Therapy (Signed)
[facility] [address] [place], [place], [address] Phone: [phone]   Fax:  [phone]  Physical Therapy Treatment  Patient Details  Name: Bryan Armstrong MRN: [id number] Date of Birth: August 04, 1935 Referring Provider (PT): Leta Baptist, MD   Encounter Date: 10/31/2020   PT End of Session - 10/31/20 1445    Visit Number 3    Number of Visits 7    Date for PT Re-Evaluation 12/16/20   POC for 6 weeks, Cert for 60 days   Authorization Type UHC Medicare (10th Visit PN)    Progress Note Due on Visit 10    PT Start Time 1445    PT Stop Time 1530    PT Time Calculation (min) 45 min    Equipment Utilized During Treatment Gait belt    Activity Tolerance Patient tolerated treatment well    Behavior During Therapy [facility] for tasks assessed/performed           Past Medical History:  Diagnosis Date  . BRADYCARDIA 06/01/2007  . Cataract    bil cataracts removed  . COPD (chronic obstructive pulmonary disease) ([place])   . EMPHYSEMA 03/28/2008  . Emphysema of lung ([place])   . GERD (gastroesophageal reflux disease)   . HYPERGLYCEMIA 06/01/2007  . HYPERLIPIDEMIA 06/01/2007  . Hypertension   . HYPERTENSION 01/19/2009  . Peripheral arterial disease ([place])   . TIA (transient ischemic attack)     Past Surgical History:  Procedure Laterality Date  . ABDOMINAL AORTOGRAM W/LOWER EXTREMITY N/A 03/05/2020   Procedure: ABDOMINAL AORTOGRAM W/  LOWER EXTREMITY;  Surgeon: Lorretta Harp, MD;  Location: [place] CV LAB;  Service: Cardiovascular;  Laterality: N/A;  . CATARACT EXTRACTION, BILATERAL    . COLONOSCOPY    . ENDARTERECTOMY FEMORAL Right 04/03/2020   Procedure: Right Femoral Endarterectomy;  Surgeon: Elam Dutch, MD;  Location: [facility] OR;  Service: Vascular;  Laterality: Right;  . ENDARTERECTOMY FEMORAL Left 09/10/2020   Procedure: ENDARTERECTOMY FEMORAL LEFT;  Surgeon: Elam Dutch, MD;  Location: [place];  Service: Vascular;   Laterality: Left;  . EYE SURGERY     Bilaterl cataracts  . PATCH ANGIOPLASTY Left 09/10/2020   Procedure: PATCH ANGIOPLASTY OF LEFT FEMORAL ARTERY USING HEMASHIELD PLATINUM FINESSE PATCH;  Surgeon: Elam Dutch, MD;  Location: [place];  Service: Vascular;  Laterality: Left;  . POLYPECTOMY    . TONSILLECTOMY AND ADENOIDECTOMY      Vitals:   10/31/20 1449  BP: (!) 153/82  Pulse: 61     Subjective Assessment - 10/31/20 1446    Subjective No new changes. No falls or pain to report. No episodes of vertigo recently. Reports VOR is going well.    Pertinent History Recent R femoral endarterectomy, Bradycardia, COPD, Emphysema, GERD, HLD, HTN, PAD, TIA (June 2020)    Limitations Walking    Patient Stated Goals Get Rid of Sensation.    Currently in Pain? No/denies                Vestibular Treatment/Exercise - 10/31/20 0001      Vestibular Treatment/Exercise   Vestibular Treatment Provided Gaze    Gaze Exercises X1 Viewing Horizontal;X1 Viewing Vertical      X1 Viewing Horizontal   Foot Position standing feet apart    Reps 2    Comments x 60 seconds      X1 Viewing Vertical   Foot Position standing feet apart    Reps 2    Comments x  60 seconds. increased balance challenge reported via patient, increased sway noted           Gaze Stabilization: Standing Feet Apart    Feet shoulder width apart, keeping eyes on target on wall 3-4 feet away, tilt head down 15-30 and move head side to side for 60 seconds. Repeat while moving head up and down for 60 seconds. Do 2-3 sessions per day. Repeat using target on pattern background.   Completed the following balance exercises as established initial HEP. PT educating to complete in a corner in home with chair in front for improved safety. Patient requiring increased verbal cues for head turns/nods to purely isolate at neck and avoid trunk movements.   Access Code: [email or web address] URL: [email or web address] Date:  10/31/2020 Prepared by: Baldomero Lamy  Exercises Romberg Stance Eyes Closed on Foam Pad - 1 x daily - 5 x weekly - 1 sets - 3 reps - 30 hold Romberg Stance with Head Nods on Foam Pad - 1 x daily - 5 x weekly - 2 sets - 10 reps Romberg Stance on Foam Pad with Head Rotation - 1 x daily - 5 x weekly - 2 sets - 10 reps Tandem Stance - 1 x daily - 5 x weekly - 1 sets - 3 reps - 30 hold Tandem Walking with Head Rotations - 1 x daily - 5 x weekly - 1 sets - 5 reps  Copyright  VHI. All rights reserved.         PT Education - 10/31/20 1458    Education Details progress VOR (standing), and initial balance HEP    Person(s) Educated Patient    Methods Explanation    Comprehension Verbalized understanding            PT Short Term Goals - 10/17/20 1507      PT SHORT TERM GOAL #1   Title Patient will be independent with initial Balance/vestibular HEP (all STGs Due: 11/07/20)    Baseline no HEP established    Time 3    Period Weeks    Status New    Target Date 11/07/20             PT Long Term Goals - 10/24/20 1648      PT LONG TERM GOAL #1   Title Patient will be independent with final vestibular/balance HEP (all LTGS due: 11/28/20)    Baseline no HEP established    Time 6    Period Weeks    Status New      PT LONG TERM GOAL #2   Title Patient will be able to hold situation 4 of M-CTSIB for >/= 20 seconds to demonstrate improved vestibular input for balance    Baseline 7-8 secs    Time 6    Period Weeks    Status New      PT LONG TERM GOAL #3   Title Patient will improve FGA to >/= 22/30 to demonstrate improved balance and reduced fall risk    Baseline 18/30    Time 6    Period Weeks    Status New      PT LONG TERM GOAL #4   Title Patient will demonstrate </= 1 line difference on DVA to demonstrate improved VOR function    Baseline 2 line difference    Time 6    Period Weeks    Status New                 Plan - 10/31/20 1532  Clinical Impression  Statement Progressed VOR x 1 into standing today and to 60 seconds with patient tolerating well. Increased balance challenge with vertical in standing. Initiated Balance HEP focusing on complaint surfaces with vision removed and head turns. Increased balance challenge with vision removed noted today. Will continue to progress toward all LTGs.    Personal Factors and Comorbidities Age;Comorbidity 3+    Comorbidities Recent R femoral endarterectomy, Bradycardia, COPD, Emphysema, GERD, HLD, HTN, PAD, TIA    Examination-Activity Limitations Locomotion Level    Examination-Participation Restrictions Driving;Community Activity    Stability/Clinical Decision Making Stable/Uncomplicated    Rehab Potential Fair    PT Frequency 1x / week    PT Duration 6 weeks    PT Treatment/Interventions ADLs/Self Care Home Management;Canalith Repostioning;Cryotherapy;Moist Heat;DME Instruction;Gait training;Stair training;Functional mobility training;Therapeutic activities;Therapeutic exercise;Balance training;Neuromuscular re-education;Patient/family education;Manual techniques;Passive range of motion;Vestibular    PT Next Visit Plan How was HEP? continue to progress VOR and Balance Exercises. High level Balance (balance beam, rockerboard). Gait with head Turns    Consulted and Agree with Plan of Care Patient           Patient will benefit from skilled therapeutic intervention in order to improve the following deficits and impairments:  Decreased balance,Dizziness,Decreased mobility,Decreased safety awareness,Decreased activity tolerance  Visit Diagnosis: Dizziness and giddiness  Unsteadiness on feet     Problem List Patient Active Problem List   Diagnosis Date Noted  . TIA (transient ischemic attack) 04/18/2020  . PAD (peripheral artery disease) ([place]) 04/03/2020  . Neck pain 12/30/2018  . GERD (gastroesophageal reflux disease) 09/28/2018  . Peripheral arterial disease ([place]) 06/15/2018  . Former smoker  06/15/2018  . History of colonic polyps 01/07/2018  . Nonspecific abnormal finding in stool contents 01/07/2018  . Allergic rhinitis 05/31/2013  . Urticaria 11/02/2011  . HTN (hypertension) 01/19/2009  . COPD, group A, by GOLD 2017 classification ([place]) 03/28/2008  . Dyslipidemia 06/01/2007  . Bradycardia 06/01/2007  . HYPERGLYCEMIA 06/01/2007    Jones Bales, PT, DPT 10/31/2020, 3:34 PM  [facility] [address] [place], [place], [address] Phone: [phone]   Fax:  [phone]  Name: Bryan Armstrong MRN: [id number] Date of Birth: 1935/06/05

## 2020-11-07 ENCOUNTER — Ambulatory Visit: Payer: Medicare Other

## 2020-11-07 ENCOUNTER — Other Ambulatory Visit: Payer: Self-pay

## 2020-11-07 DIAGNOSIS — R2681 Unsteadiness on feet: Secondary | ICD-10-CM

## 2020-11-07 DIAGNOSIS — R42 Dizziness and giddiness: Secondary | ICD-10-CM

## 2020-11-07 NOTE — Therapy (Signed)
[facility] [address] [place], [place], [address] Phone: [phone]   Fax:  [phone]  Physical Therapy Treatment  Patient Details  Name: Bryan Armstrong MRN: [id number] Date of Birth: 1935/08/01 Referring Provider (PT): Leta Baptist, MD   Encounter Date: 11/07/2020   PT End of Session - 11/07/20 1401    Visit Number 4    Number of Visits 7    Date for PT Re-Evaluation 12/16/20   POC for 6 weeks, Cert for 60 days   Authorization Type [employer] Medicare (10th Visit PN)    Progress Note Due on Visit 10    PT Start Time 1401    PT Stop Time 1444    PT Time Calculation (min) 43 min    Equipment Utilized During Treatment Gait belt    Activity Tolerance Patient tolerated treatment well    Behavior During Therapy WFL for tasks assessed/performed           Past Medical History:  Diagnosis Date  . BRADYCARDIA 06/01/2007  . Cataract    bil cataracts removed  . COPD (chronic obstructive pulmonary disease) ([place])   . EMPHYSEMA 03/28/2008  . Emphysema of lung ([place])   . GERD (gastroesophageal reflux disease)   . HYPERGLYCEMIA 06/01/2007  . HYPERLIPIDEMIA 06/01/2007  . Hypertension   . HYPERTENSION 01/19/2009  . Peripheral arterial disease ([place])   . TIA (transient ischemic attack)     Past Surgical History:  Procedure Laterality Date  . ABDOMINAL AORTOGRAM W/LOWER EXTREMITY N/A 03/05/2020   Procedure: ABDOMINAL AORTOGRAM W/  LOWER EXTREMITY;  Surgeon: Lorretta Harp, MD;  Location: [facility];  Service: Cardiovascular;  Laterality: N/A;  . CATARACT EXTRACTION, BILATERAL    . COLONOSCOPY    . ENDARTERECTOMY FEMORAL Right 04/03/2020   Procedure: Right Femoral Endarterectomy;  Surgeon: Elam Dutch, MD;  Location: [facility] OR;  Service: Vascular;  Laterality: Right;  . ENDARTERECTOMY FEMORAL Left 09/10/2020   Procedure: ENDARTERECTOMY FEMORAL LEFT;  Surgeon: Elam Dutch, MD;  Location: [facility];  Service: Vascular;   Laterality: Left;  . EYE SURGERY     Bilaterl cataracts  . PATCH ANGIOPLASTY Left 09/10/2020   Procedure: PATCH ANGIOPLASTY OF LEFT FEMORAL ARTERY USING HEMASHIELD PLATINUM FINESSE PATCH;  Surgeon: Elam Dutch, MD;  Location: [facility];  Service: Vascular;  Laterality: Left;  . POLYPECTOMY    . TONSILLECTOMY AND ADENOIDECTOMY      There were no vitals filed for this visit.   Subjective Assessment - 11/07/20 1403    Subjective No new changes. Patient reports tandem stance is challenging. No falls.    Pertinent History Recent R femoral endarterectomy, Bradycardia, COPD, Emphysema, GERD, HLD, HTN, PAD, TIA (June 2020)    Limitations Walking    Patient Stated Goals Get Rid of Sensation.    Currently in Pain? No/denies               [facility] Adult PT Treatment/Exercise - 11/07/20 0001      Ambulation/Gait   Ambulation/Gait Yes    Ambulation/Gait Assistance 5: Supervision    Ambulation/Gait Assistance Details throughout therapy gym with activities    Assistive device None    Gait Pattern Step-through pattern;Wide base of support    Ambulation Surface Level;Indoor      High Level Balance   High Level Balance Activities Other (comment)    High Level Balance Comments Completed ambulation with visual tracking, completed vertical tracking 2 x 30', followed by CW/CCW circles  with ball 2 x 30' each. mild increase in dizziness after completion reported.               Balance Exercises - 11/07/20 0001      Balance Exercises: Standing   Standing Eyes Opened Narrow base of support (BOS);Head turns;Foam/compliant surface;Limitations    Standing Eyes Opened Limitations completed horizontal/vertical head turns,2 x 10 reps.    Standing Eyes Closed Narrow base of support (BOS);Foam/compliant surface;3 reps;30 secs;Limitations;Wide (BOA);Head turns    Standing Eyes Closed Limitations completed wide BOS progressing to narrow BOS with eyes closed, 4 x 30 seconds. Compelted wide BOS with head  turns with eyes closed, 2 x 10 reps.    Tandem Stance Eyes open;Intermittent upper extremity support;3 reps;30 secs;Limitations    Tandem Stance Time completd full tandem 2 x 30 seconds, progressed to partial tandem with horizontal head turns 2 x 30 seconds. CGA.    Rockerboard Anterior/posterior;Lateral;Head turns;EO;EC;Intermittent UE support;Limitations    Rockerboard Limitations with board positioned anterior/posterior: completed eyes open with vertical head turns x 10 reps, then eyes closed 3 x 30 seconds. with board positoned laterally: completed eyes open with hoirozntal head turns x 10 reps, then progressed to eyes closed 3 x 30 seconds. intermittent CGA. increased challenge with horizontal > vertical.    Marching Foam/compliant surface;Static;Limitations    Marching Limitations completed marhcing with eyes open on blue mat 2 x 20 seconds, then progressed to marching with eyes closed 2 x 20 seocnds. increased challenge with vision removed.               PT Short Term Goals - 10/17/20 1507      PT SHORT TERM GOAL #1   Title Patient will be independent with initial Balance/vestibular HEP (all STGs Due: 11/07/20)    Baseline no HEP established    Time 3    Period Weeks    Status New    Target Date 11/07/20             PT Long Term Goals - 10/24/20 1648      PT LONG TERM GOAL #1   Title Patient will be independent with final vestibular/balance HEP (all LTGS due: 11/28/20)    Baseline no HEP established    Time 6    Period Weeks    Status New      PT LONG TERM GOAL #2   Title Patient will be able to hold situation 4 of M-CTSIB for >/= 20 seconds to demonstrate improved vestibular input for balance    Baseline 7-8 secs    Time 6    Period Weeks    Status New      PT LONG TERM GOAL #3   Title Patient will improve FGA to >/= 22/30 to demonstrate improved balance and reduced fall risk    Baseline 18/30    Time 6    Period Weeks    Status New      PT LONG TERM GOAL #4    Title Patient will demonstrate </= 1 line difference on DVA to demonstrate improved VOR function    Baseline 2 line difference    Time 6    Period Weeks    Status New                 Plan - 11/07/20 1446    Clinical Impression Statement Today's skilled session focused on continued high level balance as tolerated by patient. Continue to progress balance on complaint surfaces. Improved balance  noted with vision removed. Visual tracking activites lead to mild dizziness. Will continue per POC and progress as tolerated.    Personal Factors and Comorbidities Age;Comorbidity 3+    Comorbidities Recent R femoral endarterectomy, Bradycardia, COPD, Emphysema, GERD, HLD, HTN, PAD, TIA    Examination-Activity Limitations Locomotion Level    Examination-Participation Restrictions Driving;Community Activity    Stability/Clinical Decision Making Stable/Uncomplicated    Rehab Potential Fair    PT Frequency 1x / week    PT Duration 6 weeks    PT Treatment/Interventions ADLs/Self Care Home Management;Canalith Repostioning;Cryotherapy;Moist Heat;DME Instruction;Gait training;Stair training;Functional mobility training;Therapeutic activities;Therapeutic exercise;Balance training;Neuromuscular re-education;Patient/family education;Manual techniques;Passive range of motion;Vestibular    PT Next Visit Plan How was HEP? continue to progress VOR and Balance Exercises. High level Balance (balance beam, rockerboard). Gait with head Turns    Consulted and Agree with Plan of Care Patient           Patient will benefit from skilled therapeutic intervention in order to improve the following deficits and impairments:  Decreased balance,Dizziness,Decreased mobility,Decreased safety awareness,Decreased activity tolerance  Visit Diagnosis: Dizziness and giddiness  Unsteadiness on feet     Problem List Patient Active Problem List   Diagnosis Date Noted  . TIA (transient ischemic attack) 04/18/2020  .  PAD (peripheral artery disease) (HCC) 04/03/2020  . Neck pain 12/30/2018  . GERD (gastroesophageal reflux disease) 09/28/2018  . Peripheral arterial disease ([facility]) 06/15/2018  . Former smoker 06/15/2018  . History of colonic polyps 01/07/2018  . Nonspecific abnormal finding in stool contents 01/07/2018  . Allergic rhinitis 05/31/2013  . Urticaria 11/02/2011  . HTN (hypertension) 01/19/2009  . COPD, group A, by GOLD 2017 classification (HCC) 03/28/2008  . Dyslipidemia 06/01/2007  . Bradycardia 06/01/2007  . HYPERGLYCEMIA 06/01/2007    Tempie Donning, PT, DPT 11/07/2020, 2:48 PM  [facility] [address] [place], [place], [address] Phone: [phone]   Fax:  [phone]  Name: Bryan Armstrong MRN: [id number] Date of Birth: April 25, 1935

## 2020-11-14 ENCOUNTER — Other Ambulatory Visit: Payer: Self-pay

## 2020-11-14 ENCOUNTER — Ambulatory Visit: Payer: Medicare Other | Attending: Otolaryngology

## 2020-11-14 VITALS — BP 116/72 | HR 75

## 2020-11-14 DIAGNOSIS — R42 Dizziness and giddiness: Secondary | ICD-10-CM | POA: Diagnosis not present

## 2020-11-14 DIAGNOSIS — H8112 Benign paroxysmal vertigo, left ear: Secondary | ICD-10-CM | POA: Insufficient documentation

## 2020-11-14 DIAGNOSIS — R2681 Unsteadiness on feet: Secondary | ICD-10-CM | POA: Diagnosis not present

## 2020-11-14 NOTE — Therapy (Signed)
[facility] [address] [place], [place], [address] Phone: [phone]   Fax:  [phone]  Physical Therapy Treatment  Patient Details  Name: Bryan Armstrong MRN: [id number] Date of Birth: 11-Aug-1935 Referring Provider (PT): Leta Baptist, MD   Encounter Date: 11/14/2020   PT End of Session - 11/14/20 1801    Visit Number 5    Number of Visits 7    Date for PT Re-Evaluation 12/16/20   POC for 6 weeks, Cert for 60 days   Authorization Type [employer] Medicare (10th Visit PN)    Progress Note Due on Visit 10    PT Start Time 1446    PT Stop Time 1535    PT Time Calculation (min) 49 min    Equipment Utilized During Treatment Gait belt    Activity Tolerance Patient tolerated treatment well    Behavior During Therapy [facility] for tasks assessed/performed           Past Medical History:  Diagnosis Date  . BRADYCARDIA 06/01/2007  . Cataract    bil cataracts removed  . COPD (chronic obstructive pulmonary disease) ([place])   . EMPHYSEMA 03/28/2008  . Emphysema of lung ([place])   . GERD (gastroesophageal reflux disease)   . HYPERGLYCEMIA 06/01/2007  . HYPERLIPIDEMIA 06/01/2007  . Hypertension   . HYPERTENSION 01/19/2009  . Peripheral arterial disease ([place])   . TIA (transient ischemic attack)     Past Surgical History:  Procedure Laterality Date  . ABDOMINAL AORTOGRAM W/LOWER EXTREMITY N/A 03/05/2020   Procedure: ABDOMINAL AORTOGRAM W/  LOWER EXTREMITY;  Surgeon: Lorretta Harp, MD;  Location: [facility];  Service: Cardiovascular;  Laterality: N/A;  . CATARACT EXTRACTION, BILATERAL    . COLONOSCOPY    . ENDARTERECTOMY FEMORAL Right 04/03/2020   Procedure: Right Femoral Endarterectomy;  Surgeon: Elam Dutch, MD;  Location[facility];  Service: Vascular;  Laterality: Right;  . ENDARTERECTOMY FEMORAL Left 09/10/2020   Procedure: ENDARTERECTOMY FEMORAL LEFT;  Surgeon: Elam Dutch, MD;  Location: [facility];  Service: Vascular;   Laterality: Left;  . EYE SURGERY     Bilaterl cataracts  . PATCH ANGIOPLASTY Left 09/10/2020   Procedure: PATCH ANGIOPLASTY OF LEFT FEMORAL ARTERY USING HEMASHIELD PLATINUM FINESSE PATCH;  Surgeon: Elam Dutch, MD;  Location: [facility];  Service: Vascular;  Laterality: Left;  . POLYPECTOMY    . TONSILLECTOMY AND ADENOIDECTOMY      Vitals:   11/14/20 1455 11/14/20 1500 11/14/20 1517  BP: (!) 109/57 100/60 116/72  Pulse: 66 68 75     Subjective Assessment - 11/14/20 1449    Subjective Patient reports had a severe episode of vertigo that occured during walking, reports that he was being pulled to the R side. Reports feel like a magnet was pulling him. Reports some blurred vision. Denies any other neurological symptoms, including no weakness, speech changes, or sensation changes. Lowered himself to the ground on his knees and stayed approx 10 minutes. Then reports that he took a nap and reports he woke up feeling better but was lacking energy. Reports feeling better today.    Pertinent History Recent R femoral endarterectomy, Bradycardia, COPD, Emphysema, GERD, HLD, HTN, PAD, TIA (June 2020)    Limitations Walking    Patient Stated Goals Get Rid of Sensation.    Currently in Pain? No/denies                [facility] Adult PT Treatment/Exercise - 11/14/20 0001  Ambulation/Gait   Ambulation/Gait Yes    Ambulation/Gait Assistance 5: Supervision    Ambulation/Gait Assistance Details throughout therapy gym with activities    Assistive device None    Gait Pattern Step-through pattern;Wide base of support    Ambulation Surface Level;Indoor      High Level Balance   High Level Balance Activities Head turns    High Level Balance Comments Completed ambulation with horizontal/vertical head turns 2 x 50' each. increased challenge with horizontal > vertical. Compelted ball toss with visual tracking 2 x 50". no increase in dizziness reported.      Self-Care   Self-Care Other Self-Care  Comments    Other Self-Care Comments  Due to patient reports of recent vertigo attack and similarity to prior TIA in July 2021. PT assessed BP, no abnormal BP today during session and no concerning neurological symptoms to send patient immediately to ED/Call 911. PT called [facility] to have appointment scheduled ASAP to be further evaluated by Neurologist. PT able to have appointment scheduled for patient during session. PT edcuated patient of signs/symptoms of TIA/CVA and educated to call 911 if he has any of these symptoms going forward. Patietn verbalized understanding.               Balance Exercises - 11/14/20 0001      Balance Exercises: Standing   Standing Eyes Closed Narrow base of support (BOS);Foam/compliant surface;3 reps;30 secs;Limitations;Wide (BOA);Head turns    Standing Eyes Closed Limitations with narrow BOS completed eyes closed 3 x 30 seconds, with wide BOS completed horizontal/vertical head turns, 2 x 10 reps.             PT Education - 11/14/20 1801    Education Details See Self Care    Person(s) Educated Patient    Methods Explanation    Comprehension Verbalized understanding            PT Short Term Goals - 11/14/20 1808      PT SHORT TERM GOAL #1   Title Patient will be independent with initial Balance/vestibular HEP (all STGs Due: 11/07/20)    Baseline reports independence    Time 3    Period Weeks    Status Achieved    Target Date 11/07/20             PT Long Term Goals - 10/24/20 1648      PT LONG TERM GOAL #1   Title Patient will be independent with final vestibular/balance HEP (all LTGS due: 11/28/20)    Baseline no HEP established    Time 6    Period Weeks    Status New      PT LONG TERM GOAL #2   Title Patient will be able to hold situation 4 of M-CTSIB for >/= 20 seconds to demonstrate improved vestibular input for balance    Baseline 7-8 secs    Time 6    Period Weeks    Status New      PT LONG TERM GOAL #3    Title Patient will improve FGA to >/= 22/30 to demonstrate improved balance and reduced fall risk    Baseline 18/30    Time 6    Period Weeks    Status New      PT LONG TERM GOAL #4   Title Patient will demonstrate </= 1 line difference on DVA to demonstrate improved VOR function    Baseline 2 line difference    Time 6    Period Weeks  Status New                 Plan - 11/14/20 1806    Clinical Impression Statement Assessed STG today with patient able to meet. Patient reporting incident of severe pulling sensation on Monday similar to what experienced during prior TIA. Vitals Normal, No other neurological symptoms noted requiring transfer to ED. PT called and spoke with [facility], and will be seen Monday. PT educating on signs/symptoms of CVA/TIA and to seek medical attention if any of these arise. Completed minimal activities today. Will continue to monitor and progress toward all goals.    Personal Factors and Comorbidities Age;Comorbidity 3+    Comorbidities Recent R femoral endarterectomy, Bradycardia, COPD, Emphysema, GERD, HLD, HTN, PAD, TIA    Examination-Activity Limitations Locomotion Level    Examination-Participation Restrictions Driving;Community Activity    Stability/Clinical Decision Making Stable/Uncomplicated    Rehab Potential Fair    PT Frequency 1x / week    PT Duration 6 weeks    PT Treatment/Interventions ADLs/Self Care Home Management;Canalith Repostioning;Cryotherapy;Moist Heat;DME Instruction;Gait training;Stair training;Functional mobility training;Therapeutic activities;Therapeutic exercise;Balance training;Neuromuscular re-education;Patient/family education;Manual techniques;Passive range of motion;Vestibular    PT Next Visit Plan How was HEP? continue to progress VOR and Balance Exercises. High level Balance (balance beam, rockerboard). Gait with head Turns    Consulted and Agree with Plan of Care Patient           Patient will  benefit from skilled therapeutic intervention in order to improve the following deficits and impairments:  Decreased balance,Dizziness,Decreased mobility,Decreased safety awareness,Decreased activity tolerance  Visit Diagnosis: Dizziness and giddiness  Unsteadiness on feet     Problem List Patient Active Problem List   Diagnosis Date Noted  . TIA (transient ischemic attack) 04/18/2020  . PAD (peripheral artery disease) ([place]) 04/03/2020  . Neck pain 12/30/2018  . GERD (gastroesophageal reflux disease) 09/28/2018  . Peripheral arterial disease ([place]) 06/15/2018  . Former smoker 06/15/2018  . History of colonic polyps 01/07/2018  . Nonspecific abnormal finding in stool contents 01/07/2018  . Allergic rhinitis 05/31/2013  . Urticaria 11/02/2011  . HTN (hypertension) 01/19/2009  . COPD, group A, by GOLD 2017 classification ([place]) 03/28/2008  . Dyslipidemia 06/01/2007  . Bradycardia 06/01/2007  . HYPERGLYCEMIA 06/01/2007    Jones Bales, PT, DPT 11/14/2020, 6:10 PM  [facility] [address] [place], [place], [address] Phone: [phone]   Fax:  [phone]  Name: Bryan Armstrong MRN: [id number] Date of Birth: 03-May-1935

## 2020-11-19 ENCOUNTER — Ambulatory Visit: Payer: Medicare Other | Admitting: Adult Health

## 2020-11-19 ENCOUNTER — Encounter: Payer: Self-pay | Admitting: Adult Health

## 2020-11-19 VITALS — BP 127/77 | HR 67 | Ht 71.0 in | Wt 213.0 lb

## 2020-11-19 DIAGNOSIS — R42 Dizziness and giddiness: Secondary | ICD-10-CM | POA: Diagnosis not present

## 2020-11-19 DIAGNOSIS — G459 Transient cerebral ischemic attack, unspecified: Secondary | ICD-10-CM | POA: Diagnosis not present

## 2020-11-19 NOTE — Patient Instructions (Addendum)
Continue working with PT for vestibular therapy as this will greatly benefit your vertigo  Continue aspirin 81 mg daily  and Crestor  for secondary stroke prevention  Ensure you schedule follow up with an eye doctor for further evaluation of your right eye  Continue to follow up with PCP regarding cholesterol and blood pressure management  Maintain strict control of hypertension with blood pressure goal below 130/90 and cholesterol with LDL cholesterol (bad cholesterol) goal below 70 mg/dL.       Followup in the future with me in 4 months or call earlier if needed       Thank you for coming to see Bryan Armstrong at [facility]. I hope we have been able to provide you high quality care today.  You may receive a patient satisfaction survey over the next few weeks. We would appreciate your feedback and comments so that we may continue to improve ourselves and the health of our patients.    Benign Positional Vertigo Vertigo is the feeling that you or your surroundings are moving when they are not. Benign positional vertigo is the most common form of vertigo. This is usually a harmless condition (benign). This condition is positional. This means that symptoms are triggered by certain movements and positions. This condition can be dangerous if it occurs while you are doing something that could cause harm to you or others. This includes activities such as driving or operating machinery. What are the causes? The inner ear has fluid-filled canals that help your brain sense movement and balance. When the fluid moves, the brain receives messages about your body's position. With benign positional vertigo, crystals in the inner ear break free and disturb the inner ear area. This causes your brain to receive confusing messages about your body's position. What increases the risk? You are more likely to develop this condition if:  You are a woman.  You are 89 years of age or older.  You  have recently had a head injury.  You have an inner ear disease. What are the signs or symptoms? Symptoms of this condition usually happen when you move your head or your eyes in different directions. Symptoms may start suddenly, and usually last for less than a minute. They include:  Loss of balance and falling.  Feeling like you are spinning or moving.  Feeling like your surroundings are spinning or moving.  Nausea and vomiting.  Blurred vision.  Dizziness.  Involuntary eye movement (nystagmus). Symptoms can be mild and cause only minor problems, or they can be severe and interfere with daily life. Episodes of benign positional vertigo may return (recur) over time. Symptoms may improve over time. How is this diagnosed? This condition may be diagnosed based on:  Your medical history.  Physical exam of the head, neck, and ears.  Positional tests to check for or stimulate vertigo. You may be asked to turn your head and change positions, such as going from sitting to lying down. A health care provider will watch for symptoms of vertigo. You may be referred to a health care provider who specializes in ear, nose, and throat problems (ENT, or otolaryngologist) or a provider who specializes in disorders of the nervous system (neurologist). How is this treated? This condition may be treated in a session in which your health care provider moves your head in specific positions to help the displaced crystals in your inner ear move. Treatment for this condition may take several sessions. Surgery may be needed in severe  cases, but this is rare. In some cases, benign positional vertigo may resolve on its own in 2-4 weeks.   Follow these instructions at home: Safety  Move slowly. Avoid sudden body or head movements or certain positions, as told by your health care provider.  Avoid driving until your health care provider says it is safe for you to do so.  Avoid operating heavy machinery until  your health care provider says it is safe for you to do so.  Avoid doing any tasks that would be dangerous to you or others if vertigo occurs.  If you have trouble walking or keeping your balance, try using a cane for stability. If you feel dizzy or unstable, sit down right away.  Return to your normal activities as told by your health care provider. Ask your health care provider what activities are safe for you. General instructions  Take over-the-counter and prescription medicines only as told by your health care provider.  Drink enough fluid to keep your urine pale yellow.  Keep all follow-up visits as told by your health care provider. This is important. Contact a health care provider if:  You have a fever.  Your condition gets worse or you develop new symptoms.  Your family or friends notice any behavioral changes.  You have nausea or vomiting that gets worse.  You have numbness or a prickling and tingling sensation. Get help right away if you:  Have difficulty speaking or moving.  Are always dizzy.  Faint.  Develop severe headaches.  Have weakness in your legs or arms.  Have changes in your hearing or vision.  Develop a stiff neck.  Develop sensitivity to light. Summary  Vertigo is the feeling that you or your surroundings are moving when they are not. Benign positional vertigo is the most common form of vertigo.  This condition is caused by crystals in the inner ear that become displaced. This causes a disturbance in an area of the inner ear that helps your brain sense movement and balance.  Symptoms include loss of balance and falling, feeling that you or your surroundings are moving, nausea and vomiting, and blurred vision.  This condition can be diagnosed based on symptoms, a physical exam, and positional tests.  Follow safety instructions as told by your health care provider. You will also be told when to contact your health care provider in case of  problems. This information is not intended to replace advice given to you by your health care provider. Make sure you discuss any questions you have with your health care provider. Document Revised: 08/23/2019 Document Reviewed: 03/10/2018 Elsevier Patient Education  2021 [employer].

## 2020-11-19 NOTE — Progress Notes (Signed)
[facility] [address] [place]. [place] [address] [phone]       OFFICE FOLLOW-UP NOTE  [name] Date of Birth:  1935-04-22 Medical Record Number:  [id number]    Chief Complaint  Patient presents with  . Follow-up    Room 14 - alone. Six month follow up. Reports having a flare-up of Vertigo on 11/12/20. He opted out of meclizine because it causes drowsiness. He decided to lay down to sleep and it resolved without treatment. He is still taking aspirin 81mg  daily. He is still participating in PT weekly.      HPI:   Today, 11/19/2020, [name] returns for TIA follow-up as well as concerns of dizziness/vertigo unaccompanied.  Reports episode of vertigo 1 week ago where he felt as though he was getting pulled towards the right side and was able to lower himself to his knees as he felt as though he was going to fall.  This sensation lasted for 2 to 3 minutes and then subsided.  He laid down in his bed and took a nap for approximately 20 minutes with resolution of symptoms upon awakening.  Denies any other associated symptoms such as weakness, numbness/tingling, visual changes, speech changes, facial weakness or headaches.  He was evaluated by ENT for continued vertigo (unable to view via epic) - per patient, told left inner ear issue and referred to PT for vestibular rehab.  He has been working with neuro rehab PT with improvement of symptoms.  He is able to turn his head more without provoking symptoms and symptoms typically progress with quick head movements.  He did trial meclizine x1 with increased fatigue without noticeable benefit.  Otherwise, he has been stable from a stroke standpoint.  Reports ongoing compliance with aspirin 81 mg daily and Crestor 20 mg daily and denies side effects.  Blood pressure today 127/77.  Previously having issues with hypotension therefore PCP discontinued Maxzide and blood pressure has been slowly stabilizing.  He will occasionally  have low blood pressure still where he will feel lightheaded in 1 to 2 seconds OD visual change which are typical symptoms of low blood pressure for patient.  No further concerns at this time.    History provided for reference purposes only Initial visit 06/19/2020 Dr. Leonie Man: [name] is a 86 year old Caucasian male seen today for initial office follow-up visit following hospital consultation for TIA in July 2021.  History is obtained from the patient, review of electronic medical records and I personally reviewed available imaging films in PACS.  He has past medical history for hyperlipidemia, hypertension, peripheral arterial disease, COPD, bradycardia with history of syncope who presented to [facility] on 04/17/2020 with sudden onset of gait instability.  He felt suddenly off balance and was leaning to the right side and slammed into a wall.  He denied any accompanying vertigo, room spinning, lightheadedness blurred vision no slurred speech or double vision.  This lasted about 5 minutes and then gradually returned back to normal.  He stated he had a previous episode 10 years ago of suddenly collapsing after getting up from the dining table at a restaurant and was felt to have had a vasovagal syncope at that time.  He does have history of peripheral arterial disease and underwent right femoral endarterectomy on 04/03/2020 with Dr. Oneida Alar and left-sided endarterectomy is also planned.  MRI scan of the brain obtained in the hospital was negative for acute infarct and showed small changes of small vessel disease  CT angiogram showed no significant extracranial intracranial large vessel stenosis or occlusion.  LDL cholesterol 57 mg percent and hemoglobin A1c was 5.7.  Transthoracic echo showed normal ejection fraction without cardiac source of embolism.  Outpatient cardiac Holter monitoring showed no significant cardiac arrhythmias.  Patient states he has had episodes of hypotension which are manifested with  blurred vision and feeling tired and fuzzy headed.  The has noticed this happen multiple times when his blood pressure has been below 536 systolic.  He does take Maxide about 2 or 3 days a week when his blood pressure is running in the 150s.  He seems to be exquisitely sensitive to this.  He plans to discuss with his primary care physician alternative blood pressure management strategies.  He admits to not drinking enough fluids.  He was scheduled to undergo dental surgery soon which she hopes will solve his problems.  He has had no recurrent stroke or TIA symptoms.  He remains on Crestor 20 mg he is tolerating well without muscle aches and pains.     ROS:   14 system review of systems is positive for those listed in HPI and all other systems negative  PMH:  Past Medical History:  Diagnosis Date  . BRADYCARDIA 06/01/2007  . Cataract    bil cataracts removed  . COPD (chronic obstructive pulmonary disease) ([place])   . EMPHYSEMA 03/28/2008  . Emphysema of lung ([place])   . GERD (gastroesophageal reflux disease)   . HYPERGLYCEMIA 06/01/2007  . HYPERLIPIDEMIA 06/01/2007  . Hypertension   . HYPERTENSION 01/19/2009  . Peripheral arterial disease ([place])   . TIA (transient ischemic attack)     Social History:  Social History   Socioeconomic History  . Marital status: Married    Spouse name: Not on file  . Number of children: Not on file  . Years of education: Not on file  . Highest education level: Not on file  Occupational History  . Occupation: Retired   Tobacco Use  . Smoking status: Former Smoker    Packs/day: 0.50    Years: 40.00    Pack years: 20.00    Types: Cigarettes    Quit date: 09/26/2017    Years since quitting: 3.1  . Smokeless tobacco: Never Used  . Tobacco comment: stopped 2018  Vaping Use  . Vaping Use: Never used  Substance and Sexual Activity  . Alcohol use: No  . Drug use: No  . Sexual activity: Not on file  Other Topics Concern  . Not on file  Social History  Narrative   Lives at home with wife   Right handed   Caffeine: drinks de-caf drinks, diet drinks if its a coke.   Social Determinants of Health   Financial Resource Strain: Not on file  Food Insecurity: Not on file  Transportation Needs: Not on file  Physical Activity: Not on file  Stress: Not on file  Social Connections: Not on file  Intimate Partner Violence: Not on file    Medications:   Current Outpatient Medications on File Prior to Visit  Medication Sig Dispense Refill  . APPLE CIDER VINEGAR PO Take 480 mg by mouth daily.    Marland Kitchen aspirin 81 MG tablet Take 81 mg by mouth daily.     Marland Kitchen EPINEPHrine (EPIPEN 2-PAK) 0.3 mg/0.3 mL IJ SOAJ injection Inject 0.3 mLs (0.3 mg total) into the muscle as needed for anaphylaxis. 1 each 0  . fluticasone (FLONASE) 50 MCG/ACT nasal spray Place 2 sprays into both  nostrils daily as needed for allergies or rhinitis.    . Fluticasone-Salmeterol (ADVAIR DISKUS) 250-50 MCG/DOSE AEPB Inhale 1 puff into the lungs See admin instructions. Alternate days inhaling 1 puff once a day then 1 puff twice daily. May increase to 1 puff twice everyday during allergy season    . HYDROcodone-acetaminophen (NORCO/VICODIN) 5-325 MG tablet Take 1 tablet by mouth every 6 (six) hours as needed for moderate pain. (Patient not taking: Reported on 10/17/2020) 20 tablet 0  . meclizine (ANTIVERT) 25 MG tablet Take 1 tablet (25 mg total) by mouth 3 (three) times daily as needed for dizziness. (Patient not taking: Reported on 10/17/2020) 30 tablet 0  . Multiple Vitamins-Minerals (PRESERVISION AREDS 2 PO) Take 1 capsule by mouth daily. (Patient not taking: Reported on 10/17/2020)    . rosuvastatin (CRESTOR) 20 MG tablet Take 20 mg by mouth at bedtime.    . triamterene-hydrochlorothiazide (MAXZIDE-25) 37.5-25 MG tablet Take 0.5 tablets by mouth daily as needed (if sbp is above 135).     No current facility-administered medications on file prior to visit.    Allergies:   Allergies  Allergen  Reactions  . Bee Venom Anaphylaxis    Yellow jackets  . Meclizine     Fatigue     Physical Exam Today's Vitals   11/19/20 0943  BP: 127/77  Pulse: 67  Weight: 213 lb (96.6 kg)  Height: 5\' 11"  (1.803 m)   Body mass index is 29.71 kg/m.   General: well developed, well nourished pleasant elderly Caucasian male, seated, in no evident distress Head: head normocephalic and atraumatic.  Neck: supple with no carotid or supraclavicular bruits Cardiovascular: regular rate and rhythm, no murmurs Musculoskeletal: no deformity Skin:  no rash/petichiae Vascular:  Normal pulses all extremities  Neurologic Exam Mental Status: Awake and fully alert.  Fluent speech and language.  Oriented to place and time. Recent and remote memory intact. Attention span, concentration and fund of knowledge appropriate. Mood and affect appropriate.  Cranial Nerves: Pupils equal, briskly reactive to light. Extraocular movements full without nystagmus. Visual fields full to confrontation. Hearing slightly diminished bilaterally. Facial sensation intact. Face, tongue, palate moves normally and symmetrically.  Motor: Normal bulk and tone. Normal strength in all tested extremity muscles. Sensory.: intact to touch ,pinprick .position and vibratory sensation.  Coordination: Rapid alternating movements normal in all extremities. Finger-to-nose and heel-to-shin performed accurately bilaterally. Gait and Station: Arises from chair without difficulty. Stance is normal. Gait demonstrates normal stride length and balance. Able to heel, toe and tandem walk with only slight difficulty. Reflexes: 1+ and symmetric. Toes downgoing.       ASSESSMENT/PLAN: 86 year old male with posterior circulation TIA in July 2021 from small vessel disease.  Vascular risk factors of hypertension hyperlipidemia, peripheral arterial disease and age.  He has been also having vertigo evaluated by ENT possibly BPPV    1. TIA : Continue aspirin 81  mg daily  and Crestor for secondary stroke prevention.  Discussed secondary stroke prevention measures and importance of close PCP f/u for aggressive stroke risk factor management  2. Vertigo: Unknown etiology.  Symptoms seem to correlate with BPPV.  Similar presentation 04/2020 (during TIA admission) with work-up largely unremarkable.  Episode 1 week ago although low suspicion for TIA or stroke and likely episode of BPPV.  No indication for imaging as symptoms quickly resolved, not associated with any other neuro or stroke/TIA symptoms and no reoccurrence.  Vertigo has been slowly improving with use of vestibular therapy which was highly  encouraged to continue.  Discussed treatment options with PT likely providing greatest benefit.  Would not recommend medication management at this time as symptoms are brief and do not occur consistently -benefit would likely not outweigh risk.  Discussed importance of calling 911 immediately with any additional vertigo type episodes which are prolonged or associated with weakness, numbness/tingling, vision changes, speech changes or facial weakness 3. HTN w/ hypotension: BP goal <130/90.  BP stabilizing since discontinuing Maxzide currently on nonpharmacological management.  Currently monitored/managed by PCP 4. HLD: LDL goal <70. On Crestor 20 mg daily per PCP    Follow-up in 4 months or call earlier if needed    CC:  [facility] provider: Dr. Julian Reil, Algis Greenhouse, MD   I spent 30 minutes of face-to-face and non-face-to-face time with patient.  This included previsit chart review, lab review, study review, order entry, electronic health record documentation, patient education and discussion regarding recent episode of likely BPPV, clinical features and treatment of BPPV, history of possible TIA and importance of managing stroke risk factors and answered all other questions to patients satisfaction  Frann Rider, AGNP-BC  [facility] [address] [place], [place] [address]  Phone [phone] Fax [phone] Note: This document was prepared with digital dictation and possible smart phrase technology. Any transcriptional errors that result from this process are unintentional.

## 2020-11-20 NOTE — Progress Notes (Signed)
I agree with the above plan 

## 2020-11-21 ENCOUNTER — Ambulatory Visit: Payer: Medicare Other

## 2020-11-21 ENCOUNTER — Other Ambulatory Visit: Payer: Self-pay

## 2020-11-21 DIAGNOSIS — R42 Dizziness and giddiness: Secondary | ICD-10-CM | POA: Diagnosis not present

## 2020-11-21 DIAGNOSIS — H8112 Benign paroxysmal vertigo, left ear: Secondary | ICD-10-CM | POA: Diagnosis not present

## 2020-11-21 DIAGNOSIS — R2681 Unsteadiness on feet: Secondary | ICD-10-CM

## 2020-11-21 NOTE — Therapy (Signed)
[facility] [address] [place], [place], [address] Phone: [phone]   Fax:  [phone]  Physical Therapy Treatment  Patient Details  Name: Bryan Armstrong MRN: [id number] Date of Birth: 1935/01/19 Referring Provider (PT): Leta Baptist, MD   Encounter Date: 11/21/2020   PT End of Session - 11/21/20 1407    Visit Number 6    Number of Visits 7    Date for PT Re-Evaluation 12/16/20   POC for 6 weeks, Cert for 60 days   Authorization Type [employer] Medicare (10th Visit PN)    Progress Note Due on Visit 10    PT Start Time 1401    PT Stop Time 1442    PT Time Calculation (min) 41 min    Equipment Utilized During Treatment Gait belt    Activity Tolerance Patient tolerated treatment well    Behavior During Therapy WFL for tasks assessed/performed           Past Medical History:  Diagnosis Date  . BRADYCARDIA 06/01/2007  . Cataract    bil cataracts removed  . COPD (chronic obstructive pulmonary disease) ([place])   . EMPHYSEMA 03/28/2008  . Emphysema of lung ([place])   . GERD (gastroesophageal reflux disease)   . HYPERGLYCEMIA 06/01/2007  . HYPERLIPIDEMIA 06/01/2007  . Hypertension   . HYPERTENSION 01/19/2009  . Peripheral arterial disease ([place])   . TIA (transient ischemic attack)     Past Surgical History:  Procedure Laterality Date  . ABDOMINAL AORTOGRAM W/LOWER EXTREMITY N/A 03/05/2020   Procedure: ABDOMINAL AORTOGRAM W/  LOWER EXTREMITY;  Surgeon: Lorretta Harp, MD;  Location: [facility];  Service: Cardiovascular;  Laterality: N/A;  . CATARACT EXTRACTION, BILATERAL    . COLONOSCOPY    . ENDARTERECTOMY FEMORAL Right 04/03/2020   Procedure: Right Femoral Endarterectomy;  Surgeon: Elam Dutch, MD;  Location: [facility] OR;  Service: Vascular;  Laterality: Right;  . ENDARTERECTOMY FEMORAL Left 09/10/2020   Procedure: ENDARTERECTOMY FEMORAL LEFT;  Surgeon: Elam Dutch, MD;  Location: [facility];  Service: Vascular;   Laterality: Left;  . EYE SURGERY     Bilaterl cataracts  . PATCH ANGIOPLASTY Left 09/10/2020   Procedure: PATCH ANGIOPLASTY OF LEFT FEMORAL ARTERY USING HEMASHIELD PLATINUM FINESSE PATCH;  Surgeon: Elam Dutch, MD;  Location: [facility];  Service: Vascular;  Laterality: Left;  . POLYPECTOMY    . TONSILLECTOMY AND ADENOIDECTOMY      There were no vitals filed for this visit.   Subjective Assessment - 11/21/20 1404    Subjective Patient reports that appointment at neurologist went well. No other new changes/complaints. Patient reports feeling well today.    Pertinent History Recent R femoral endarterectomy, Bradycardia, COPD, Emphysema, GERD, HLD, HTN, PAD, TIA (June 2020)    Limitations Walking    Patient Stated Goals Get Rid of Sensation.    Currently in Pain? No/denies             Vestibular Assessment - 11/21/20 0001      Positional Testing   Dix-Hallpike Dix-Hallpike Right;Dix-Hallpike Left    Sidelying Test Sidelying Left      Dix-Hallpike Right   Dix-Hallpike Right Duration None    Dix-Hallpike Right Symptoms No nystagmus      Dix-Hallpike Left   Dix-Hallpike Left Duration 25 seconds    Dix-Hallpike Left Symptoms Upbeat, left rotatory nystagmus      Sidelying Left   Sidelying Left Duration None    Sidelying Left Symptoms No nystagmus  after treatment            Vestibular Treatment/Exercise - 11/21/20 0001      Vestibular Treatment/Exercise   Vestibular Treatment Provided Canalith Repositioning    Canalith Repositioning Epley Manuever Left       EPLEY MANUEVER LEFT   Number of Reps  2    Overall Response  Symptoms Resolved     RESPONSE DETAILS LEFT No nystagmus on last rep.                 PT Education - 11/21/20 1437    Education Details Educated on BPPV (Handout Provided)    Person(s) Educated Patient    Methods Explanation;Handout    Comprehension Verbalized understanding            PT Short Term Goals - 11/14/20 1808      PT  SHORT TERM GOAL #1   Title Patient will be independent with initial Balance/vestibular HEP (all STGs Due: 11/07/20)    Baseline reports independence    Time 3    Period Weeks    Status Achieved    Target Date 11/07/20             PT Long Term Goals - 10/24/20 1648      PT LONG TERM GOAL #1   Title Patient will be independent with final vestibular/balance HEP (all LTGS due: 11/28/20)    Baseline no HEP established    Time 6    Period Weeks    Status New      PT LONG TERM GOAL #2   Title Patient will be able to hold situation 4 of M-CTSIB for >/= 20 seconds to demonstrate improved vestibular input for balance    Baseline 7-8 secs    Time 6    Period Weeks    Status New      PT LONG TERM GOAL #3   Title Patient will improve FGA to >/= 22/30 to demonstrate improved balance and reduced fall risk    Baseline 18/30    Time 6    Period Weeks    Status New      PT LONG TERM GOAL #4   Title Patient will demonstrate </= 1 line difference on DVA to demonstrate improved VOR function    Baseline 2 line difference    Time 6    Period Weeks    Status New                 Plan - 11/21/20 1423    Clinical Impression Statement Upon assessment of BPPV, patient demonstrating L Upbeating Nystagmus with positional testing of short duration demonstating L Posterior Canal Canaltithiasis. Treated x 2 reps with resolution of symtoms, no nystagmus/vertigo reported with reassesment. PT educated on BPPV and handout provided. Will conitnue to progress towrad LTGs.    Personal Factors and Comorbidities Age;Comorbidity 3+    Comorbidities Recent R femoral endarterectomy, Bradycardia, COPD, Emphysema, GERD, HLD, HTN, PAD, TIA    Examination-Activity Limitations Locomotion Level    Examination-Participation Restrictions Driving;Community Activity    Stability/Clinical Decision Making Stable/Uncomplicated    Rehab Potential Fair    PT Frequency 1x / week    PT Duration 6 weeks    PT  Treatment/Interventions ADLs/Self Care Home Management;Canalith Repostioning;Cryotherapy;Moist Heat;DME Instruction;Gait training;Stair training;Functional mobility training;Therapeutic activities;Therapeutic exercise;Balance training;Neuromuscular re-education;Patient/family education;Manual techniques;Passive range of motion;Vestibular    PT Next Visit Plan Re-Check BPPV. Check LTG + Re-Cert.  continue to progress VOR and Balance Exercises. High  level Balance (balance beam, rockerboard). Gait with head Turns    Consulted and Agree with Plan of Care Patient           Patient will benefit from skilled therapeutic intervention in order to improve the following deficits and impairments:  Decreased balance,Dizziness,Decreased mobility,Decreased safety awareness,Decreased activity tolerance  Visit Diagnosis: Dizziness and giddiness  Unsteadiness on feet  BPPV (benign paroxysmal positional vertigo), left     Problem List Patient Active Problem List   Diagnosis Date Noted  . TIA (transient ischemic attack) 04/18/2020  . PAD (peripheral artery disease) ([place]) 04/03/2020  . Neck pain 12/30/2018  . GERD (gastroesophageal reflux disease) 09/28/2018  . Peripheral arterial disease ([place]) 06/15/2018  . Former smoker 06/15/2018  . History of colonic polyps 01/07/2018  . Nonspecific abnormal finding in stool contents 01/07/2018  . Allergic rhinitis 05/31/2013  . Urticaria 11/02/2011  . HTN (hypertension) 01/19/2009  . COPD, group A, by GOLD 2017 classification ([place]) 03/28/2008  . Dyslipidemia 06/01/2007  . Bradycardia 06/01/2007  . HYPERGLYCEMIA 06/01/2007    Jones Bales, PT, DPT 11/21/2020, 2:45 PM  [facility] [address] [place], [place], [address] Phone: [phone]   Fax:  [phone]  Name: Bryan Armstrong MRN: [id number] Date of Birth: Mar 15, 1935

## 2020-11-28 ENCOUNTER — Other Ambulatory Visit: Payer: Self-pay

## 2020-11-28 ENCOUNTER — Ambulatory Visit: Payer: Medicare Other

## 2020-11-28 VITALS — BP 128/82 | HR 74

## 2020-11-28 DIAGNOSIS — H8112 Benign paroxysmal vertigo, left ear: Secondary | ICD-10-CM | POA: Diagnosis not present

## 2020-11-28 DIAGNOSIS — R2681 Unsteadiness on feet: Secondary | ICD-10-CM | POA: Diagnosis not present

## 2020-11-28 DIAGNOSIS — R42 Dizziness and giddiness: Secondary | ICD-10-CM

## 2020-11-28 NOTE — Therapy (Signed)
[facility] [address] [place], [place], [address] Phone: [phone]   Fax:  [phone]  Physical Therapy Treatment/Discharge Summary  Patient Details  Name: Bryan Armstrong MRN: [id number] Date of Birth: January 17, 1935 Referring Provider (PT): Leta Baptist, MD  PHYSICAL THERAPY DISCHARGE SUMMARY  Visits from Start of Care: 7  Current functional level related to goals / functional outcomes: See Clinical Impression Statement for Details   Remaining deficits: Low Fall Risk   Education / Equipment: HEP Provided  Plan: Patient agrees to discharge.  Patient goals were partially met. Patient is being discharged due to meeting the stated rehab goals.  ?????         Encounter Date: 11/28/2020   PT End of Session - 11/28/20 1355    Visit Number 7    Number of Visits 7    Date for PT Re-Evaluation 12/16/20   POC for 6 weeks, Cert for 60 days   Authorization Type [employer] Medicare (10th Visit PN)    Progress Note Due on Visit 10    PT Start Time 1355    PT Stop Time 1430    PT Time Calculation (min) 35 min    Equipment Utilized During Treatment Gait belt    Activity Tolerance Patient tolerated treatment well    Behavior During Therapy WFL for tasks assessed/performed           Past Medical History:  Diagnosis Date  . BRADYCARDIA 06/01/2007  . Cataract    bil cataracts removed  . COPD (chronic obstructive pulmonary disease) ([place])   . EMPHYSEMA 03/28/2008  . Emphysema of lung ([place])   . GERD (gastroesophageal reflux disease)   . HYPERGLYCEMIA 06/01/2007  . HYPERLIPIDEMIA 06/01/2007  . Hypertension   . HYPERTENSION 01/19/2009  . Peripheral arterial disease ([place])   . TIA (transient ischemic attack)     Past Surgical History:  Procedure Laterality Date  . ABDOMINAL AORTOGRAM W/LOWER EXTREMITY N/A 03/05/2020   Procedure: ABDOMINAL AORTOGRAM W/  LOWER EXTREMITY;  Surgeon: Lorretta Harp, MD;  Location: [facility];  Service: Cardiovascular;  Laterality: N/A;  . CATARACT EXTRACTION, BILATERAL    . COLONOSCOPY    . ENDARTERECTOMY FEMORAL Right 04/03/2020   Procedure: Right Femoral Endarterectomy;  Surgeon: Elam Dutch, MD;  Location: [facility] OR;  Service: Vascular;  Laterality: Right;  . ENDARTERECTOMY FEMORAL Left 09/10/2020   Procedure: ENDARTERECTOMY FEMORAL LEFT;  Surgeon: Elam Dutch, MD;  Location: [facility];  Service: Vascular;  Laterality: Left;  . EYE SURGERY     Bilaterl cataracts  . PATCH ANGIOPLASTY Left 09/10/2020   Procedure: PATCH ANGIOPLASTY OF LEFT FEMORAL ARTERY USING HEMASHIELD PLATINUM FINESSE PATCH;  Surgeon: Elam Dutch, MD;  Location: [facility];  Service: Vascular;  Laterality: Left;  . POLYPECTOMY    . TONSILLECTOMY AND ADENOIDECTOMY      Vitals:   11/28/20 1400 11/28/20 1403  BP: 99/60 128/82  Pulse: 68 74     Subjective Assessment - 11/28/20 1356    Subjective Reports no sensation of vertigo recently. Does feel lightheaded at times reports feels like BP is low. No falls. Reports that he played golf for 18 holes and had no issues.    Pertinent History Recent R femoral endarterectomy, Bradycardia, COPD, Emphysema, GERD, HLD, HTN, PAD, TIA (June 2020)    Limitations Walking    Patient Stated Goals Get Rid of Sensation.    Currently in Pain? No/denies  [facility] PT Assessment - 11/28/20 0001      Functional Gait  Assessment   Gait assessed  Yes    Gait Level Surface Walks 20 ft in less than 7 sec but greater than 5.5 sec, uses assistive device, slower speed, mild gait deviations, or deviates 6-10 in outside of the 12 in walkway width.    Change in Gait Speed Able to smoothly change walking speed without loss of balance or gait deviation. Deviate no more than 6 in outside of the 12 in walkway width.    Gait with Horizontal Head Turns Performs head turns smoothly with no change in gait. Deviates no more than 6 in outside 12 in walkway width    Gait with  Vertical Head Turns Performs head turns with no change in gait. Deviates no more than 6 in outside 12 in walkway width.    Gait and Pivot Turn Pivot turns safely within 3 sec and stops quickly with no loss of balance.    Step Over Obstacle Is able to step over 2 stacked shoe boxes taped together (9 in total height) without changing gait speed. No evidence of imbalance.    Gait with Narrow Base of Support Ambulates 7-9 steps.    Gait with Eyes Closed Walks 20 ft, uses assistive device, slower speed, mild gait deviations, deviates 6-10 in outside 12 in walkway width. Ambulates 20 ft in less than 9 sec but greater than 7 sec.    Ambulating Backwards Walks 20 ft, uses assistive device, slower speed, mild gait deviations, deviates 6-10 in outside 12 in walkway width.    Steps Alternating feet, must use rail.    Total Score 25    FGA comment: 25/30               Vestibular Assessment - 11/28/20 0001      Visual Acuity   Static 9    Dynamic 8      Positional Testing   Dix-Hallpike Dix-Hallpike Left      Dix-Hallpike Left   Dix-Hallpike Left Duration None    Dix-Hallpike Left Symptoms No nystagmus            [facility] Adult PT Treatment/Exercise - 11/28/20 0001      Transfers   Transfers Sit to Stand;Stand to Sit    Sit to Stand 6: Modified independent (Device/Increase time)    Stand to Sit 6: Modified independent (Device/Increase time)      Ambulation/Gait   Ambulation/Gait Yes    Ambulation/Gait Assistance 5: Supervision    Ambulation/Gait Assistance Details throughout therapy gym with balance testing    Assistive device None    Gait Pattern Step-through pattern;Wide base of support    Ambulation Surface Level;Indoor      Neuro Re-ed    Neuro Re-ed Details  Completed M-CTSIB, patient able to hold situation 1-3 for full 30 seconds. Situation 4 for 15 seconds.      Exercises   Exercises Other Exercises    Other Exercises  Complete verbal Review of current HEP, PT educating on  importance of compliance with activities.            Gaze Stabilization: Standing Feet Apart    Feet shoulder width apart, keeping eyes on target on wall 3-4 feet away, tilt head down 15-30 and move head side to side for 60 seconds. Repeat while moving head up and down for 60 seconds. Do 2-3 sessions per day. Repeat using target on pattern background.   Access Code:  [email or web address] URL: [email or web address] Date: 10/31/2020 Prepared by: Baldomero Lamy  Exercises Romberg Stance Eyes Closed on Foam Pad - 1 x daily - 5 x weekly - 1 sets - 3 reps - 30 hold Romberg Stance with Head Nods on Foam Pad - 1 x daily - 5 x weekly - 2 sets - 10 reps Romberg Stance on Foam Pad with Head Rotation - 1 x daily - 5 x weekly - 2 sets - 10 reps Tandem Stance - 1 x daily - 5 x weekly - 1 sets - 3 reps - 30 hold Tandem Walking with Head Rotations - 1 x daily - 5 x weekly - 1 sets - 5 reps       PT Education - 11/28/20 1433    Education Details Ready for D/C. HEP Review. Process for obtaining new referral from MD if another episode occurs    Person(s) Educated Patient    Methods Explanation    Comprehension Verbalized understanding            PT Short Term Goals - 11/14/20 1808      PT SHORT TERM GOAL #1   Title Patient will be independent with initial Balance/vestibular HEP (all STGs Due: 11/07/20)    Baseline reports independence    Time 3    Period Weeks    Status Achieved    Target Date 11/07/20             PT Long Term Goals - 11/28/20 1402      PT LONG TERM GOAL #1   Title Patient will be independent with final vestibular/balance HEP (all LTGS due: 11/28/20)    Baseline Reports exercises are going well; Independent    Time 6    Period Weeks    Status Achieved      PT LONG TERM GOAL #2   Title Patient will be able to hold situation 4 of M-CTSIB for >/= 20 seconds to demonstrate improved vestibular input for balance    Baseline 7-8 secs; 15 seconds    Time 6     Period Weeks    Status Not Met      PT LONG TERM GOAL #3   Title Patient will improve FGA to >/= 22/30 to demonstrate improved balance and reduced fall risk    Baseline 18/30;  25/30    Time 6    Period Weeks    Status Achieved      PT LONG TERM GOAL #4   Title Patient will demonstrate </= 1 line difference on DVA to demonstrate improved VOR function    Baseline 2 line difference; 1 line difference on 2/16    Time 6    Period Weeks    Status Achieved                 Plan - 11/28/20 1435    Clinical Impression Statement Today's skilled PT session included assesment of progress toward all LTG. Patient able to meet LTG #1, 3 and 4. Patient improving FGA to 25/30 demonstrating reduced fall risk, and 1 line difference with DVA demosntrating improved VOR. With positional testing, patient negative for BPPV demosntrating resolution of L Posterior Canal Canalithiasis. Patient still dmeo increased challenge on situation 4 of M - CTSIB. PT educaitng on readiness for discharge at this time due to progress with PT services. Patient verbalziing agreement. PT educating on process for obtaining new referral if patient has new episode of vertigo.    Personal Factors and Comorbidities Age;Comorbidity 3+  Comorbidities Recent R femoral endarterectomy, Bradycardia, COPD, Emphysema, GERD, HLD, HTN, PAD, TIA    Examination-Activity Limitations Locomotion Level    Examination-Participation Restrictions Driving;Community Activity    Stability/Clinical Decision Making Stable/Uncomplicated    Rehab Potential Fair    PT Frequency 1x / week    PT Duration 6 weeks    PT Treatment/Interventions ADLs/Self Care Home Management;Canalith Repostioning;Cryotherapy;Moist Heat;DME Instruction;Gait training;Stair training;Functional mobility training;Therapeutic activities;Therapeutic exercise;Balance training;Neuromuscular re-education;Patient/family education;Manual techniques;Passive range of motion;Vestibular     Consulted and Agree with Plan of Care Patient           Patient will benefit from skilled therapeutic intervention in order to improve the following deficits and impairments:  Decreased balance,Dizziness,Decreased mobility,Decreased safety awareness,Decreased activity tolerance  Visit Diagnosis: Dizziness and giddiness  Unsteadiness on feet  BPPV (benign paroxysmal positional vertigo), left     Problem List Patient Active Problem List   Diagnosis Date Noted  . TIA (transient ischemic attack) 04/18/2020  . PAD (peripheral artery disease) ([place]) 04/03/2020  . Neck pain 12/30/2018  . GERD (gastroesophageal reflux disease) 09/28/2018  . Peripheral arterial disease ([place]) 06/15/2018  . Former smoker 06/15/2018  . History of colonic polyps 01/07/2018  . Nonspecific abnormal finding in stool contents 01/07/2018  . Allergic rhinitis 05/31/2013  . Urticaria 11/02/2011  . HTN (hypertension) 01/19/2009  . COPD, group A, by GOLD 2017 classification ([employer]) 03/28/2008  . Dyslipidemia 06/01/2007  . Bradycardia 06/01/2007  . HYPERGLYCEMIA 06/01/2007    Jones Bales, PT, DPT 11/28/2020, 2:38 PM  [facility] [address] [place], [place], [address] Phone: [phone]   Fax:  [phone]  Name: SCHNEUR CROWSON MRN: [id number] Date of Birth: [birth year]

## 2020-12-17 ENCOUNTER — Ambulatory Visit: Payer: Medicare Other | Admitting: Adult Health

## 2020-12-30 IMAGING — CT CT ANGIO NECK
2 of 7 series · 8 of 33 positions shown · IV contrast (OMNI 350)
Comparison: None.

CLINICAL DATA: Transient ischemic attack



[Series 6: cta neck · axial · 0.51mm/px · z∈[-230,-106]mm · 2 of 186 slices shown]
[im 62/186  soft-tissue]
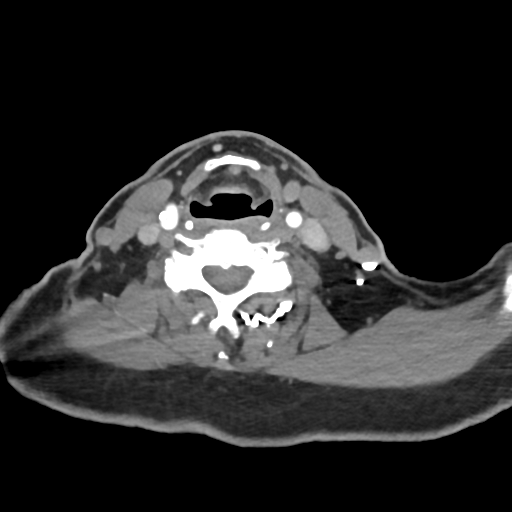
[im 124/186  soft-tissue]
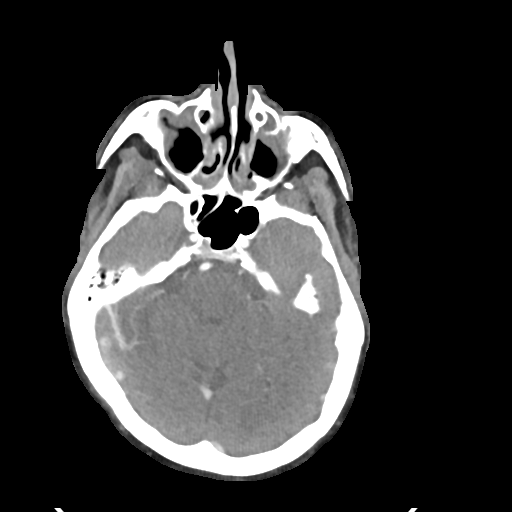

[Series 8: cta neck axial · axial · 0.39mm/px · z∈[-300,-36]mm · 6 of 370 slices shown]
[im 53/370  soft-tissue]
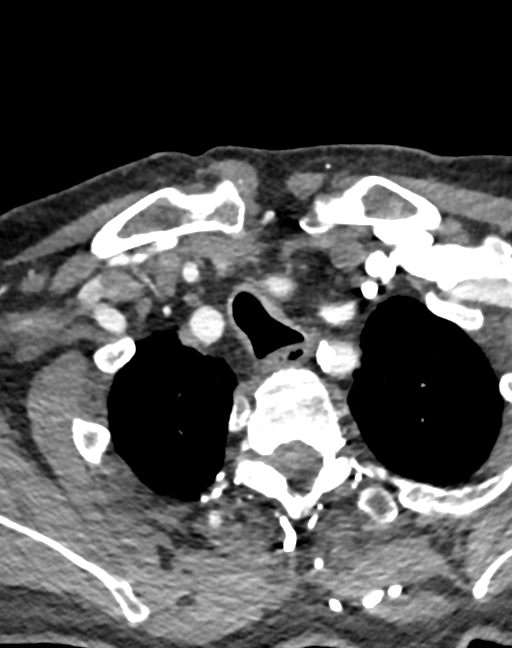
[im 106/370  bone]
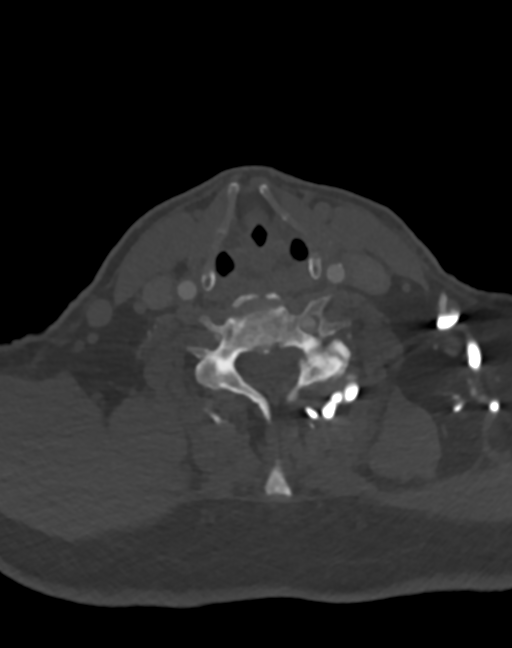
[im 159/370  soft-tissue]
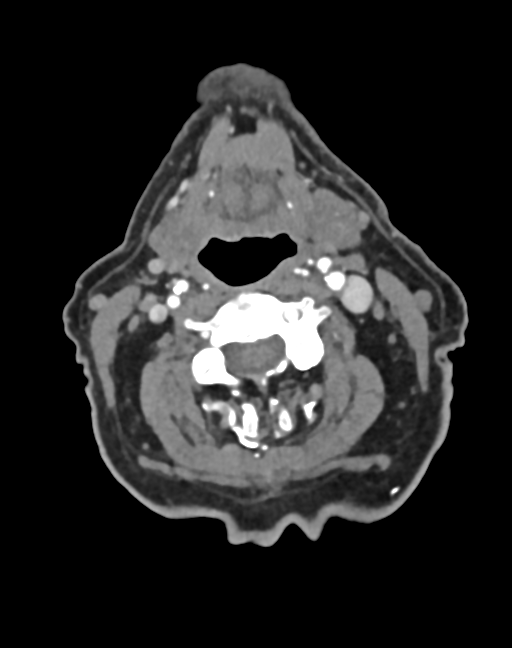
[im 211/370  bone]
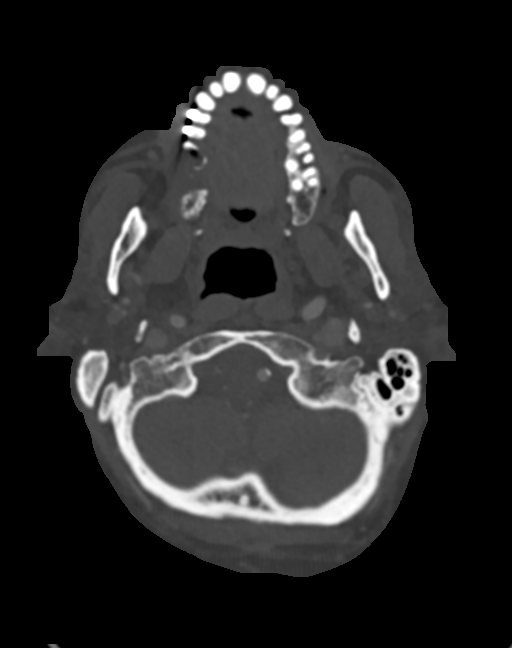
[im 264/370  soft-tissue]
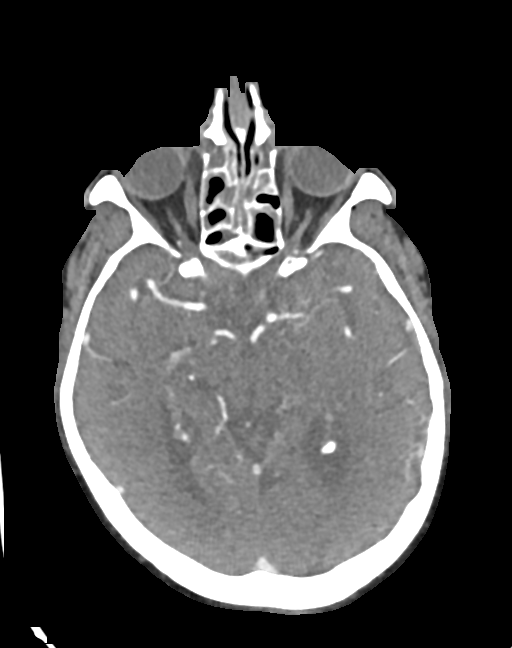
[im 317/370  bone]
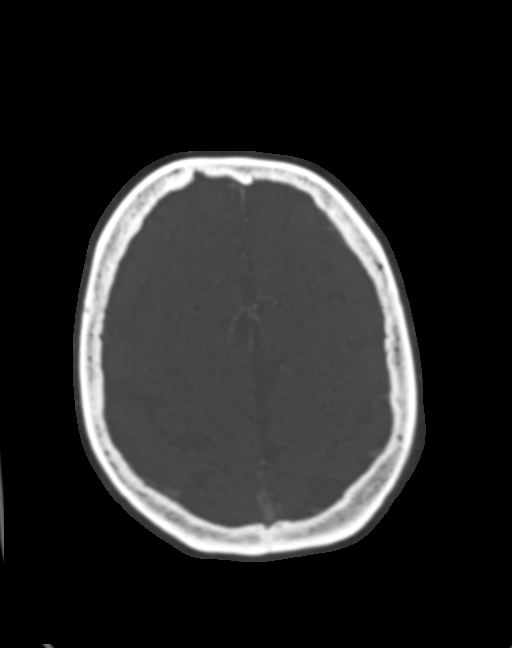

[8 of 33 positions shown; findings below may reference images not displayed]

FINDINGS: CTA NECK FINDINGS

SKELETON: There is no bony spinal canal stenosis. No lytic or
blastic lesion.

OTHER NECK: Normal pharynx, larynx and major salivary glands. No
cervical lymphadenopathy. Unremarkable thyroid gland.

UPPER CHEST: No pneumothorax or pleural effusion. No nodules or
masses.

AORTIC ARCH:

There is mild calcific atherosclerosis of the aortic arch. There is
no aneurysm, dissection or hemodynamically significant stenosis of
the visualized portion of the aorta. Conventional 3 vessel aortic
branching pattern. The visualized proximal subclavian arteries are
widely patent.

RIGHT CAROTID SYSTEM: No dissection, occlusion or aneurysm. Mild
atherosclerotic calcification at the carotid bifurcation without
hemodynamically significant stenosis.

LEFT CAROTID SYSTEM: No dissection, occlusion or aneurysm. Mild
atherosclerotic calcification at the carotid bifurcation without
hemodynamically significant stenosis.

VERTEBRAL ARTERIES: Left dominant configuration. Both origins are
clearly patent. There is no dissection, occlusion or flow-limiting
stenosis to the skull base (V1-V3 segments).

CTA HEAD FINDINGS

POSTERIOR CIRCULATION:

--Vertebral arteries: Normal V4 segments.

--Inferior cerebellar arteries: Normal.

--Basilar artery: Normal.

--Superior cerebellar arteries: Normal.

--Posterior cerebral arteries (PCA): Normal. Small right P-comm. No
left P-comm visualized.

ANTERIOR CIRCULATION:

--Intracranial internal carotid arteries: Atherosclerotic
calcification of the internal carotid arteries at the skull base
without hemodynamically significant stenosis.

--Anterior cerebral arteries (ACA): Normal. Hypoplastic right A1
segment, normal variant.

--Middle cerebral arteries (MCA): Normal.

VENOUS SINUSES: As permitted by contrast timing, patent.

ANATOMIC VARIANTS: None

Review of the MIP images confirms the above findings.
IMPRESSION: 1. No emergent large vessel occlusion or hemodynamically significant
stenosis of the head or neck.
2. Aortic Atherosclerosis ([id number]-[id number].O).

## 2021-01-14 ENCOUNTER — Telehealth: Payer: Self-pay | Admitting: Family Medicine

## 2021-01-14 NOTE — Telephone Encounter (Signed)
Left message for patient to call back and schedule Medicare Annual Wellness Visit (AWV) either virtually OR in office.   Last AWV 01/10/20; please schedule at anytime with [facility]-Nurse Health Advisor at [facility].  This should be a 45 minute visit.

## 2021-01-16 NOTE — Telephone Encounter (Signed)
Patient does not want to schedule AWV

## 2021-02-06 ENCOUNTER — Telehealth: Payer: Self-pay | Admitting: Adult Health

## 2021-02-06 NOTE — Telephone Encounter (Signed)
Spoke to pt relayed the message.  He did verbalize understanding.  He stated that he did maneuver that was done on him previously and he stated it did work and he is back to normal.  I said great. If a continued problem then can refer him for vestibular rehab if needed.   He has appt 02-19-21 with pcp and will speak with him about Bp.  He will try to hydrate.  I did reiterate if dizziness/ sz are different then what is has had in past then proceed to ED.  He did verbalize understanding of all.  Will call back as needed.

## 2021-02-06 NOTE — Telephone Encounter (Signed)
He has known history of BPPV which was treated by neuro rehab and dizziness/lightheadedness with hypotension.  Please ensure he is monitoring his blood pressure at home and if consistently low, he should schedule follow-up with his PCP as well as ensure adequate fluid intake.  If he feels as though the symptoms are consistent with his known vertigo and would like to be seen again by neuro rehab, please place order.  If these symptoms feel completely different from his known history, he should proceed to ED immediately for further evaluation

## 2021-02-06 NOTE — Telephone Encounter (Signed)
Spoke to pt and he states that he is having dizziness, swimmy headedness. Balance is not good, affects vision.  He feels like this could be same as when he had vestibular rehab back in January, but then he mentioned that he had TIA in past, and ? If parkinson's.  He states that meclizine or dramamine does not help.  He has not nausea.  These sx come and go.  When he gets up from sitting.  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  You last saw 11-2020.  Let me know what you think.

## 2021-02-06 NOTE — Telephone Encounter (Signed)
Pt called, having problem with walking a straight line, balance, head feels kind of lost. Would like a call from the nurse to discuss a sooner appt.

## 2021-02-12 ENCOUNTER — Ambulatory Visit: Payer: Medicare Other | Admitting: Primary Care

## 2021-02-12 NOTE — Progress Notes (Signed)
@Patient  ID: Bryan Armstrong, male    DOB: [birth year], 86 y.o.   MRN: [id number]  Chief Complaint  Patient presents with  . Follow-up    Requesting refill on inhaler.     Referring provider: Vivi Barrack, MD  HPI: 86 year old male, former smoker quit in 2018 (20-pack-year history).  Past medical history significant for COPD Gold 1 pretension, peripheral artery disease, TIA, allergic rhinitis, GERD, dyslipidemia.  Patient of Dr. Vaughan Browner, last seen in office on 11/11/2019.    Maintained on Advair discus 250.  Eosinophilia noted on CBC.  Chest x-ray in December 2020 showed vague right lung base opacity felt to be related to bronchitis. Recommended 72-month follow-up with chest x-ray.  02/13/2021 Patient presents today for an overdue annual follow-up for COPD. Breathing is fine. No acute respiratory symptoms. He is having a hard time using Wixella vs Advair. Needs repeat chest x-ray if not already done to follow-up right lung opacity.   He currently is dealing with some other issues related to his balance, vertigo and hypotension. He reports that he loses his balance at times and feels it is related to his vision. He has been following with [facility] neurology d/t hx TIA and vertigo, he will be following back with them in June 2022. He was referred and has been receiving rehabilitation for BPV. SBP have been running between high 80-110s at home. He is not currently on blood pressure medication. He is trying to stay hydrated. Saw cardiology in October 2021, they recommended following up in 1 year unless otherwise needed.  Neuros intact, no changes in speech.    Imaging: Chest x-ray 09/29/2019-small reticulonodular opacity at the right lung base Chest x-ray 11/11/2019-no acute cardiopulmonary abnormality. I have reviewed the images personally.  PFTs [01/06/05] FVC 4.50 [96%], FEV1 2.85 [89], F/F 63, TLC 7.76 [111%], DLCO 132%. Mild airway obstruction, normal lung volumes, no bronchial dilator  response.  Labs:  CBC 6/0/63-[id number] eosinophilic count 235  Allergies  Allergen Reactions  . Bee Venom Anaphylaxis    Yellow jackets  . Meclizine     Fatigue     Immunization History  Administered Date(s) Administered  . Fluad Quad(high Dose 65+) 07/23/2020  . Influenza Inj Mdck Quad Pf 07/19/2019  . Influenza Split 07/11/2016  . Influenza Whole 08/15/2002, 09/21/2007, 08/03/2008, 08/08/2009, 08/01/2010  . Influenza, High Dose Seasonal PF 06/20/2014, 08/30/2018  . Influenza,inj,Quad PF,6+ Mos 06/28/2013, 08/28/2015  . Influenza-Unspecified 08/14/2011, 07/18/2017, 07/19/2019  . PFIZER(Purple Top)SARS-COV-2 Vaccination 11/03/2019, 11/24/2019, 07/23/2020  . Pneumococcal Conjugate-13 10/23/2015  . Pneumococcal Polysaccharide-23 10/14/2002  . Td 07/13/2001  . Tdap 07/14/2011  . Zoster 06/14/2015    Past Medical History:  Diagnosis Date  . BRADYCARDIA 06/01/2007  . Cataract    bil cataracts removed  . COPD (chronic obstructive pulmonary disease) ([place])   . EMPHYSEMA 03/28/2008  . Emphysema of lung ([place])   . GERD (gastroesophageal reflux disease)   . HYPERGLYCEMIA 06/01/2007  . HYPERLIPIDEMIA 06/01/2007  . Hypertension   . HYPERTENSION 01/19/2009  . Peripheral arterial disease ([place])   . TIA (transient ischemic attack)     Tobacco History: Social History   Tobacco Use  Smoking Status Former Smoker  . Packs/day: 0.50  . Years: 40.00  . Pack years: 20.00  . Types: Cigarettes  . Quit date: 09/26/2017  . Years since quitting: 3.3  Smokeless Tobacco Never Used  Tobacco Comment   stopped 2018   Counseling given: Not Answered Comment: stopped 2018   Outpatient Medications Prior to  Visit  Medication Sig Dispense Refill  . aspirin 81 MG tablet Take 81 mg by mouth daily.     Marland Kitchen EPINEPHrine (EPIPEN 2-PAK) 0.3 mg/0.3 mL IJ SOAJ injection Inject 0.3 mLs (0.3 mg total) into the muscle as needed for anaphylaxis. 1 each 0  . fluticasone (FLONASE) 50 MCG/ACT nasal spray Place  2 sprays into both nostrils daily as needed for allergies or rhinitis.    . Multiple Vitamins-Minerals (PRESERVISION AREDS 2 PO) Take 1 capsule by mouth daily.    . rosuvastatin (CRESTOR) 20 MG tablet Take 20 mg by mouth at bedtime.    . Fluticasone-Salmeterol (ADVAIR DISKUS) 250-50 MCG/DOSE AEPB Inhale 1 puff into the lungs See admin instructions. Alternate days inhaling 1 puff once a day then 1 puff twice daily. May increase to 1 puff twice everyday during allergy season    . APPLE CIDER VINEGAR PO Take 480 mg by mouth daily. (Patient not taking: Reported on 02/13/2021)    . HYDROcodone-acetaminophen (NORCO/VICODIN) 5-325 MG tablet Take 1 tablet by mouth every 6 (six) hours as needed for moderate pain. (Patient not taking: Reported on 02/13/2021) 20 tablet 0  . meclizine (ANTIVERT) 25 MG tablet Take 1 tablet (25 mg total) by mouth 3 (three) times daily as needed for dizziness. (Patient not taking: Reported on 02/13/2021) 30 tablet 0   No facility-administered medications prior to visit.   Review of Systems  Review of Systems  Constitutional: Negative.   Respiratory: Negative for cough, chest tightness, shortness of breath and wheezing.        Dyspnea on exertion  Neurological: Positive for dizziness.   Physical Exam  BP 98/60 (BP Location: Left Arm, Patient Position: Sitting, Cuff Size: Normal)   Pulse 63   Temp 97.8 F (36.6 C) (Temporal)   Ht 5\' 11"  (1.803 m)   Wt 205 lb 12.8 oz (93.4 kg)   SpO2 99% Comment: RA  BMI 28.70 kg/m  Physical Exam Constitutional:      Appearance: Normal appearance.  HENT:     Head: Normocephalic and atraumatic.  Cardiovascular:     Rate and Rhythm: Normal rate and regular rhythm.  Pulmonary:     Effort: Pulmonary effort is normal.     Breath sounds: Normal breath sounds. No wheezing, rhonchi or rales.  Neurological:     General: No focal deficit present.     Mental Status: He is alert and oriented to person, place, and time. Mental status is at  baseline.     Comments: Neuros intact  Psychiatric:        Mood and Affect: Mood normal.        Behavior: Behavior normal.        Thought Content: Thought content normal.        Judgment: Judgment normal.      Lab Results:  CBC    Component Value Date/Time   WBC 11.8 (H) 09/11/2020 0134   RBC 4.27 09/11/2020 0134   HGB 12.4 (L) 09/11/2020 0134   HGB 14.2 02/28/2020 1107   HCT 38.2 (L) 09/11/2020 0134   HCT 42.6 02/28/2020 1107   PLT 181 09/11/2020 0134   PLT 201 02/28/2020 1107   MCV 89.5 09/11/2020 0134   MCV 89 02/28/2020 1107   MCH 29.0 09/11/2020 0134   MCHC 32.5 09/11/2020 0134   RDW 13.3 09/11/2020 0134   RDW 12.9 02/28/2020 1107   LYMPHSABS 1.5 04/17/2020 1801   MONOABS 0.5 04/17/2020 1801   EOSABS 0.5 04/17/2020 1801  BASOSABS 0.1 04/17/2020 1801    BMET    Component Value Date/Time   NA 137 09/11/2020 0134   NA 139 02/28/2020 1107   K 4.0 09/11/2020 0134   CL 101 09/11/2020 0134   CO2 24 09/11/2020 0134   GLUCOSE 142 (H) 09/11/2020 0134   BUN 23 09/11/2020 0134   BUN 19 02/28/2020 1107   CREATININE 1.41 (H) 09/11/2020 0134   CALCIUM 8.5 (L) 09/11/2020 0134   GFRNONAA 49 (L) 09/11/2020 0134   GFRAA >60 04/18/2020 0528    BNP No results found for: BNP  ProBNP    Component Value Date/Time   PROBNP 162.5 (H) 08/09/2011 0455    Imaging: No results found.   Assessment & Plan:   COPD, group A, by GOLD 2017 classification ([place]) - Stable interval; No acute respiratory symptoms. Difficult time using Wixella. Sending RX for Advair diskus 250-49mcg 1 puff twice daily (brand name only).   Abnormal CXR - Needs CXR today to follow-up on previous opacity right lung base noted in December 2020  Hypotension - Symptomatic hypotension; BP 98/60 today left arm - Advised patient to schedule follow-up with cardiology   BPV (benign positional vertigo) - Following with [facility] neurology, next visit due June 2022 - Attending rehabilitation for  vestibular rehab    Martyn Ehrich, NP 02/13/2021

## 2021-02-13 ENCOUNTER — Ambulatory Visit (INDEPENDENT_AMBULATORY_CARE_PROVIDER_SITE_OTHER): Payer: Medicare Other | Admitting: Primary Care

## 2021-02-13 ENCOUNTER — Other Ambulatory Visit: Payer: Self-pay

## 2021-02-13 ENCOUNTER — Encounter: Payer: Self-pay | Admitting: Primary Care

## 2021-02-13 ENCOUNTER — Ambulatory Visit (INDEPENDENT_AMBULATORY_CARE_PROVIDER_SITE_OTHER): Payer: Medicare Other

## 2021-02-13 VITALS — BP 98/60 | HR 63 | Temp 97.8°F | Ht 71.0 in | Wt 205.8 lb

## 2021-02-13 DIAGNOSIS — R9389 Abnormal findings on diagnostic imaging of other specified body structures: Secondary | ICD-10-CM

## 2021-02-13 DIAGNOSIS — M47814 Spondylosis without myelopathy or radiculopathy, thoracic region: Secondary | ICD-10-CM | POA: Diagnosis not present

## 2021-02-13 DIAGNOSIS — H8110 Benign paroxysmal vertigo, unspecified ear: Secondary | ICD-10-CM

## 2021-02-13 DIAGNOSIS — J449 Chronic obstructive pulmonary disease, unspecified: Secondary | ICD-10-CM

## 2021-02-13 DIAGNOSIS — I952 Hypotension due to drugs: Secondary | ICD-10-CM

## 2021-02-13 DIAGNOSIS — R918 Other nonspecific abnormal finding of lung field: Secondary | ICD-10-CM | POA: Diagnosis not present

## 2021-02-13 MED ORDER — FLUTICASONE-SALMETEROL 250-50 MCG/ACT IN AEPB: 1.0000 | INHALATION_SPRAY | Freq: Two times a day (BID) | RESPIRATORY_TRACT | 11 refills | Status: DC

## 2021-02-13 NOTE — Assessment & Plan Note (Addendum)
-   Following with [facility] neurology, next visit due June 2022 - Attending rehabilitation for vestibular rehab

## 2021-02-13 NOTE — Assessment & Plan Note (Signed)
>>  ASSESSMENT AND PLAN FOR HYPOTENSION WRITTEN ON 02/13/2021  3:59 PM BY WALSH, ALMARIE ORN, NP  - Symptomatic hypotension; BP 98/60 today left arm - Advised patient to schedule follow-up with cardiology

## 2021-02-13 NOTE — Assessment & Plan Note (Signed)
-   Stable interval; No acute respiratory symptoms. Difficult time using Wixella. Sending RX for Advair diskus 250-56mcg 1 puff twice daily (brand name only).

## 2021-02-13 NOTE — Assessment & Plan Note (Addendum)
-   Needs CXR today to follow-up on previous opacity right lung base noted in December 2020

## 2021-02-13 NOTE — Assessment & Plan Note (Signed)
-   Symptomatic hypotension; BP 98/60 today left arm - Advised patient to schedule follow-up with cardiology

## 2021-02-13 NOTE — Patient Instructions (Addendum)
Call Dr. Burt Knack d/t symptomatic hypotension (BP 98/60 left arm; 80s/60s right arm) Phone: [phone]  Recommendations: Sending in new prescription for Advair (not wixella)  Orders: CXR today  Follow-up 6 months with Dr. Vaughan Browner

## 2021-02-15 NOTE — Progress Notes (Signed)
Please let patient know CXR showed no active cardiopulmonary disease. Degenerative changes of thoracic spine.

## 2021-02-19 ENCOUNTER — Encounter: Payer: Self-pay | Admitting: Family Medicine

## 2021-02-19 ENCOUNTER — Ambulatory Visit (INDEPENDENT_AMBULATORY_CARE_PROVIDER_SITE_OTHER): Payer: Medicare Other | Admitting: Family Medicine

## 2021-02-19 ENCOUNTER — Other Ambulatory Visit: Payer: Self-pay

## 2021-02-19 VITALS — BP 119/68 | HR 57 | Temp 98.4°F | Ht 71.0 in | Wt 205.4 lb

## 2021-02-19 DIAGNOSIS — I739 Peripheral vascular disease, unspecified: Secondary | ICD-10-CM | POA: Diagnosis not present

## 2021-02-19 DIAGNOSIS — I952 Hypotension due to drugs: Secondary | ICD-10-CM | POA: Diagnosis not present

## 2021-02-19 DIAGNOSIS — Z0001 Encounter for general adult medical examination with abnormal findings: Secondary | ICD-10-CM | POA: Diagnosis not present

## 2021-02-19 DIAGNOSIS — G629 Polyneuropathy, unspecified: Secondary | ICD-10-CM | POA: Insufficient documentation

## 2021-02-19 DIAGNOSIS — R739 Hyperglycemia, unspecified: Secondary | ICD-10-CM | POA: Diagnosis not present

## 2021-02-19 DIAGNOSIS — E785 Hyperlipidemia, unspecified: Secondary | ICD-10-CM | POA: Diagnosis not present

## 2021-02-19 DIAGNOSIS — Z0000 Encounter for general adult medical examination without abnormal findings: Secondary | ICD-10-CM

## 2021-02-19 LAB — COMPREHENSIVE METABOLIC PANEL
ALT: 18 U/L (ref 0–53)
AST: 19 U/L (ref 0–37)
Albumin: 4 g/dL (ref 3.5–5.2)
Alkaline Phosphatase: 52 U/L (ref 39–117)
BUN: 22 mg/dL (ref 6–23)
CO2: 30 mEq/L (ref 19–32)
Calcium: 8.6 mg/dL (ref 8.4–10.5)
Chloride: 104 mEq/L (ref 96–112)
Creatinine, Ser: 1.09 mg/dL (ref 0.40–1.50)
GFR: 62.45 mL/min (ref >60.00)
Glucose, Bld: 104 mg/dL — ABNORMAL HIGH (ref 70–99)
Potassium: 4.4 mEq/L (ref 3.5–5.1)
Sodium: 140 mEq/L (ref 135–145)
Total Bilirubin: 0.6 mg/dL (ref 0.2–1.2)
Total Protein: 6.4 g/dL (ref 6.0–8.3)

## 2021-02-19 LAB — CBC
HCT: 39.9 % (ref 39.0–52.0)
Hemoglobin: 13.5 g/dL (ref 13.0–17.0)
MCHC: 33.9 g/dL (ref 30.0–36.0)
MCV: 87.6 fl (ref 78.0–100.0)
Platelets: 184 10*3/uL (ref 150.0–400.0)
RBC: 4.55 Mil/uL (ref 4.22–5.81)
RDW: 14.6 % (ref 11.5–15.5)
WBC: 6.4 10*3/uL (ref 4.0–10.5)

## 2021-02-19 LAB — LIPID PANEL
Cholesterol: 114 mg/dL (ref 0–200)
HDL: 35.9 mg/dL — ABNORMAL LOW (ref >39.00)
LDL Cholesterol: 52 mg/dL (ref 0–99)
NonHDL: 78.23
Total CHOL/HDL Ratio: 3
Triglycerides: 130 mg/dL (ref 0.0–149.0)
VLDL: 26 mg/dL (ref 0.0–40.0)

## 2021-02-19 LAB — TSH: TSH: 1.78 u[IU]/mL (ref 0.35–4.50)

## 2021-02-19 LAB — VITAMIN B12: Vitamin B-12: 212 pg/mL (ref 211–911)

## 2021-02-19 LAB — HEMOGLOBIN A1C: Hgb A1c MFr Bld: 5.9 % (ref 4.6–6.5)

## 2021-02-19 NOTE — Assessment & Plan Note (Signed)
Check labs today.  He will be following up with neurology next month.

## 2021-02-19 NOTE — Patient Instructions (Signed)
It was very nice to see you today!  We will check blood work today.  Please make sure that you are getting plenty of fluids.  He can try taking a little bit more salt to see if this helps with your blood pressure.  Please call the cardiologist to schedule an appointment discuss your low blood pressure readings.  I will see back in year.  Come back to see me sooner if needed.  Take care, Dr Jerline Pain  PLEASE NOTE:  If you had any lab tests please let us know if you have not heard back within a few days. You may see your results on mychart before we have a chance to review them but we will give you a call once they are reviewed by [place]. If we ordered any referrals today, please let us know if you have not heard from their office within the next week.   Please try these tips to maintain a healthy lifestyle:   Eat at least 3 REAL meals and 1-2 snacks per day.  Aim for no more than 5 hours between eating.  If you eat breakfast, please do so within one hour of getting up.    Each meal should contain half fruits/vegetables, one quarter protein, and one quarter carbs (no bigger than a computer mouse)   Cut down on sweet beverages. This includes juice, soda, and sweet tea.     Drink at least 1 glass of water with each meal and aim for at least 8 glasses per day   Exercise at least 150 minutes every week.    Preventive Care 24 Years and Older, Male Preventive care refers to lifestyle choices and visits with your health care provider that can promote health and wellness. This includes:  A yearly physical exam. This is also called an annual wellness visit.  Regular dental and eye exams.  Immunizations.  Screening for certain conditions.  Healthy lifestyle choices, such as: ? Eating a healthy diet. ? Getting regular exercise. ? Not using drugs or products that contain nicotine and tobacco. ? Limiting alcohol use. What can I expect for my preventive care visit? Physical exam Your  health care provider will check your:  Height and weight. These may be used to calculate your BMI (body mass index). BMI is a measurement that tells if you are at a healthy weight.  Heart rate and blood pressure.  Body temperature.  Skin for abnormal spots. Counseling Your health care provider may ask you questions about your:  Past medical problems.  Family's medical history.  Alcohol, tobacco, and drug use.  Emotional well-being.  Home life and relationship well-being.  Sexual activity.  Diet, exercise, and sleep habits.  History of falls.  Memory and ability to understand (cognition).  Work and work Statistician.  Access to firearms. What immunizations do I need? Vaccines are usually given at various ages, according to a schedule. Your health care provider will recommend vaccines for you based on your age, medical history, and lifestyle or other factors, such as travel or where you work.   What tests do I need? Blood tests  Lipid and cholesterol levels. These may be checked every 5 years, or more often depending on your overall health.  Hepatitis C test.  Hepatitis B test. Screening  Lung cancer screening. You may have this screening every year starting at age 51 if you have a 30-pack-year history of smoking and currently smoke or have quit within the past 15 years.  Colorectal cancer  screening. ? All adults should have this screening starting at age 65 and continuing until age 80. ? Your health care provider may recommend screening at age 7 if you are at increased risk. ? You will have tests every 1-10 years, depending on your results and the type of screening test.  Prostate cancer screening. Recommendations will vary depending on your family history and other risks.  Genital exam to check for testicular cancer or hernias.  Diabetes screening. ? This is done by checking your blood sugar (glucose) after you have not eaten for a while (fasting). ? You may  have this done every 1-3 years.  Abdominal aortic aneurysm (AAA) screening. You may need this if you are a current or former smoker.  STD (sexually transmitted disease) testing, if you are at risk. Follow these instructions at home: Eating and drinking  Eat a diet that includes fresh fruits and vegetables, whole grains, lean protein, and low-fat dairy products. Limit your intake of foods with high amounts of sugar, saturated fats, and salt.  Take vitamin and mineral supplements as recommended by your health care provider.  Do not drink alcohol if your health care provider tells you not to drink.  If you drink alcohol: ? Limit how much you have to 0-2 drinks a day. ? Be aware of how much alcohol is in your drink. In the [place] one drink equals one 12 oz bottle of beer (355 mL), one 5 oz glass of wine (148 mL), or one 1 oz glass of hard liquor (44 mL).   Lifestyle  Take daily care of your teeth and gums. Brush your teeth every morning and night with fluoride toothpaste. Floss one time each day.  Stay active. Exercise for at least 30 minutes 5 or more days each week.  Do not use any products that contain nicotine or tobacco, such as cigarettes, e-cigarettes, and chewing tobacco. If you need help quitting, ask your health care provider.  Do not use drugs.  If you are sexually active, practice safe sex. Use a condom or other form of protection to prevent STIs (sexually transmitted infections).  Talk with your health care provider about taking a low-dose aspirin or statin.  Find healthy ways to cope with stress, such as: ? Meditation, yoga, or listening to music. ? Journaling. ? Talking to a trusted person. ? Spending time with friends and family. Safety  Always wear your seat belt while driving or riding in a vehicle.  Do not drive: ? If you have been drinking alcohol. Do not ride with someone who has been drinking. ? When you are tired or distracted. ? While texting.  Wear a  helmet and other protective equipment during sports activities.  If you have firearms in your house, make sure you follow all gun safety procedures. What's next?  Visit your health care provider once a year for an annual wellness visit.  Ask your health care provider how often you should have your eyes and teeth checked.  Stay up to date on all vaccines. This information is not intended to replace advice given to you by your health care provider. Make sure you discuss any questions you have with your health care provider. Document Revised: 06/28/2019 Document Reviewed: 09/23/2018 Elsevier Patient Education  2021 [employer].

## 2021-02-19 NOTE — Assessment & Plan Note (Signed)
Crestor 20 mg daily current we will check lipids today.

## 2021-02-19 NOTE — Assessment & Plan Note (Signed)
Follows with vascular surgery.  He is on aspirin and statin.+

## 2021-02-19 NOTE — Assessment & Plan Note (Signed)
>>  ASSESSMENT AND PLAN FOR HYPOTENSION WRITTEN ON 02/19/2021  2:54 PM BY KENNYTH WORTH HERO, MD  Managed by cardiology blood pressure reasonably well controlled today though has had symptomatic lows into the 90s over 50s at home.  He is not currently on any hypertensives.  Encourage good oral hydration.  Also recommended compression stockings.  We will check labs today.  Recommended he follow-up with cardiology soon.

## 2021-02-19 NOTE — Progress Notes (Signed)
Chief Complaint:  Bryan Armstrong is a 86 y.o. male who presents today for his annual comprehensive physical exam and subsequent medicare annual wellness visit.    Assessment/Plan:  Chronic Problems Addressed Today: Dyslipidemia Crestor 20 mg daily current we will check lipids today.  Hypotension Managed by cardiology blood pressure reasonably well controlled today though has had symptomatic lows into the 90s over 50s at home.  He is not currently on any hypertensives.  Encourage good oral hydration.  Also recommended compression stockings.  We will check labs today.  Recommended he follow-up with cardiology soon.  PAD (peripheral artery disease) ([place]) Follows with vascular surgery.  He is on aspirin and statin.+  Neuropathy Check labs today.  He will be following up with neurology next month.  Body mass index is 28.65 kg/m. / Overweight  BMI Metric Follow Up - 02/19/21 1456      BMI Metric Follow Up-Please document annually   BMI Metric Follow Up Education provided            Preventative Healthcare: Check labs.  Up-to-date on vaccines.  Patient Counseling(The following topics were reviewed and/or handout was given):  -Nutrition: Stressed importance of moderation in sodium/caffeine intake, saturated fat and cholesterol, caloric balance, sufficient intake of fresh fruits, vegetables, and fiber.  -Stressed the importance of regular exercise.   -Substance Abuse: Discussed cessation/primary prevention of tobacco, alcohol, or other drug use; driving or other dangerous activities under the influence; availability of treatment for abuse.   -Injury prevention: Discussed safety belts, safety helmets, smoke detector, smoking near bedding or upholstery.   -Sexuality: Discussed sexually transmitted diseases, partner selection, use of condoms, avoidance of unintended pregnancy and contraceptive alternatives.   -Dental health: Discussed importance of regular tooth brushing, flossing, and  dental visits.  -Health maintenance and immunizations reviewed. Please refer to Health maintenance section.  During the course of the visit the patient was educated and counseled about appropriate screening and preventive services including:        Fall prevention   Nutrition Physical Activity Weight Management Cognition  Return to care in 1 year for next preventative visit.     Subjective:  HPI:  Health Risk Assessment: Patient considers his overall health to be good. He has no difficulty performing the following: . Preparing food and eating . Bathing  . Getting dressed . Using the toilet . Shopping . Managing Finances . Moving around from place to place  He has not had any falls within the past year.   Depression screen PHQ 2/9 02/19/2021  Decreased Interest 0  Down, Depressed, Hopeless 0  PHQ - 2 Score 0    Lifestyle Factors: Diet: Balanced.  Exercise: Limited.   Patient Care Team: Vivi Barrack, MD as PCP - General (Family Medicine) Sherren Mocha, MD as PCP - Cardiology (Cardiology) Riki Rusk, MD (Internal Medicine)   ROS: Per HPI, otherwise a complete review of systems was negative.   PMH:  The following were reviewed and entered/updated in epic: Past Medical History:  Diagnosis Date  . BRADYCARDIA 06/01/2007  . Cataract    bil cataracts removed  . COPD (chronic obstructive pulmonary disease) ([place])   . EMPHYSEMA 03/28/2008  . Emphysema of lung ([place])   . GERD (gastroesophageal reflux disease)   . HYPERGLYCEMIA 06/01/2007  . HYPERLIPIDEMIA 06/01/2007  . Hypertension   . HYPERTENSION 01/19/2009  . Peripheral arterial disease ([place])   . TIA (transient ischemic attack)    Patient Active Problem List  Diagnosis Date Noted  . Neuropathy 02/19/2021  . Abnormal CXR 02/13/2021  . BPV (benign positional vertigo) 02/13/2021  . Hypotension 06/19/2020  . TIA (transient ischemic attack) 04/18/2020  . PAD (peripheral artery disease) ([place]) 04/03/2020   . Neck pain 12/30/2018  . GERD (gastroesophageal reflux disease) 09/28/2018  . Peripheral arterial disease ([place]) 06/15/2018  . Former smoker 06/15/2018  . History of colonic polyps 01/07/2018  . Allergic rhinitis 05/31/2013  . Urticaria 11/02/2011  . COPD, group A, by GOLD 2017 classification ([place]) 03/28/2008  . Dyslipidemia 06/01/2007  . Bradycardia 06/01/2007  . HYPERGLYCEMIA 06/01/2007   Past Surgical History:  Procedure Laterality Date  . ABDOMINAL AORTOGRAM W/LOWER EXTREMITY N/A 03/05/2020   Procedure: ABDOMINAL AORTOGRAM W/  LOWER EXTREMITY;  Surgeon: Lorretta Harp, MD;  Location: [place] [facility];  Service: Cardiovascular;  Laterality: N/A;  . CATARACT EXTRACTION, BILATERAL    . COLONOSCOPY    . ENDARTERECTOMY FEMORAL Right 04/03/2020   Procedure: Right Femoral Endarterectomy;  Surgeon: Elam Dutch, MD;  Location: [facility] OR;  Service: Vascular;  Laterality: Right;  . ENDARTERECTOMY FEMORAL Left 09/10/2020   Procedure: ENDARTERECTOMY FEMORAL LEFT;  Surgeon: Elam Dutch, MD;  Location: [place];  Service: Vascular;  Laterality: Left;  . EYE SURGERY     Bilaterl cataracts  . PATCH ANGIOPLASTY Left 09/10/2020   Procedure: PATCH ANGIOPLASTY OF LEFT FEMORAL ARTERY USING HEMASHIELD PLATINUM FINESSE PATCH;  Surgeon: Elam Dutch, MD;  Location: [place];  Service: Vascular;  Laterality: Left;  . POLYPECTOMY    . TONSILLECTOMY AND ADENOIDECTOMY      Family History  Problem Relation Age of Onset  . Cancer Mother        "Male" cancer  . Uterine cancer Mother   . Heart disease Father   . Colon cancer Neg Hx   . Esophageal cancer Neg Hx   . Rectal cancer Neg Hx   . Stomach cancer Neg Hx   . Liver cancer Neg Hx   . Pancreatic cancer Neg Hx   . Prostate cancer Neg Hx   . Stroke Neg Hx     Medications- reviewed and updated Current Outpatient Medications  Medication Sig Dispense Refill  . aspirin 81 MG tablet Take 81 mg by mouth daily.     Marland Kitchen EPINEPHrine (EPIPEN  2-PAK) 0.3 mg/0.3 mL IJ SOAJ injection Inject 0.3 mLs (0.3 mg total) into the muscle as needed for anaphylaxis. 1 each 0  . fluticasone (FLONASE) 50 MCG/ACT nasal spray Place 2 sprays into both nostrils daily as needed for allergies or rhinitis.    . fluticasone-salmeterol (ADVAIR) 250-50 MCG/ACT AEPB Inhale 1 puff into the lungs every 12 (twelve) hours. 60 each 11  . HYDROcodone-acetaminophen (NORCO/VICODIN) 5-325 MG tablet Take 1 tablet by mouth every 6 (six) hours as needed for moderate pain. 20 tablet 0  . meclizine (ANTIVERT) 25 MG tablet Take 1 tablet (25 mg total) by mouth 3 (three) times daily as needed for dizziness. 30 tablet 0  . Multiple Vitamins-Minerals (PRESERVISION AREDS 2 PO) Take 1 capsule by mouth daily.    . rosuvastatin (CRESTOR) 20 MG tablet Take 20 mg by mouth at bedtime.     No current facility-administered medications for this visit.    Allergies-reviewed and updated Allergies  Allergen Reactions  . Bee Venom Anaphylaxis    Yellow jackets  . Meclizine     Fatigue     Social History   Socioeconomic History  . Marital status: Married  Spouse name: Not on file  . Number of children: Not on file  . Years of education: Not on file  . Highest education level: Not on file  Occupational History  . Occupation: Retired   Tobacco Use  . Smoking status: Former Smoker    Packs/day: 0.50    Years: 40.00    Pack years: 20.00    Types: Cigarettes    Quit date: 09/26/2017    Years since quitting: 3.4  . Smokeless tobacco: Never Used  . Tobacco comment: stopped 2018  Vaping Use  . Vaping Use: Never used  Substance and Sexual Activity  . Alcohol use: No  . Drug use: No  . Sexual activity: Not on file  Other Topics Concern  . Not on file  Social History Narrative   Lives at home with wife   Right handed   Caffeine: drinks de-caf drinks, diet drinks if its a coke.   Social Determinants of Health   Financial Resource Strain: Not on file  Food Insecurity:  Not on file  Transportation Needs: Not on file  Physical Activity: Not on file  Stress: Not on file  Social Connections: Not on file        Objective:  Physical Exam: BP 119/68   Pulse (!) 57   Temp 98.4 F (36.9 C) (Temporal)   Ht 5\' 11"  (1.803 m)   Wt 205 lb 6.4 oz (93.2 kg)   SpO2 95%   BMI 28.65 kg/m   Body mass index is 28.65 kg/m. Wt Readings from Last 3 Encounters:  02/19/21 205 lb 6.4 oz (93.2 kg)  02/13/21 205 lb 12.8 oz (93.4 kg)  11/19/20 213 lb (96.6 kg)   Gen: NAD, resting comfortably HEENT: TMs normal bilaterally. OP clear. No thyromegaly noted.  CV: RRR with no murmurs appreciated Pulm: NWOB, CTAB with no crackles, wheezes, or rhonchi GI: Normal bowel sounds present. Soft, Nontender, Nondistended. MSK: no edema, cyanosis, or clubbing noted Skin: warm, dry Neuro: CN2-12 grossly intact. Strength 5/5 in upper and lower extremities. Reflexes symmetric and intact bilaterally. Normal minicog with 3/3 delayed word recall.  Psych: Normal affect and thought content     Amari Burnsworth M. Jerline Pain, MD 02/19/2021 2:57 PM

## 2021-02-19 NOTE — Assessment & Plan Note (Signed)
Managed by cardiology blood pressure reasonably well controlled today though has had symptomatic lows into the 90s over 50s at home.  He is not currently on any hypertensives.  Encourage good oral hydration.  Also recommended compression stockings.  We will check labs today.  Recommended he follow-up with cardiology soon.

## 2021-02-21 NOTE — Progress Notes (Signed)
Please inform patient of the following:  B12 is low.  This could explain some of his symptoms.  Would like him to start B12 protocol.  Everything else is stable.  We can recheck everything else in a year.

## 2021-02-26 ENCOUNTER — Telehealth: Payer: Self-pay | Admitting: Cardiovascular Disease

## 2021-02-26 NOTE — Telephone Encounter (Signed)
Pt c/o BP issue: STAT if pt c/o blurred vision, one-sided weakness or slurred speech  1. What are your last 5 BP readings? 92/58  2. Are you having any other symptoms (ex. Dizziness, headache, blurred vision, passed out)? Dizziness   3. What is your BP issue? Pt states that his pressure is low, will take B12 shot to try to resolve PCP is wanting pt to schd appt w/ Dr. Burt Knack or APP   No available appt until 05/15/21 pt is needing to be seen sooner, please contact pt today or tomorrow morning before 9am or after 4pm.

## 2021-02-26 NOTE — Telephone Encounter (Signed)
The patient reports his BP has been low again.  He has noticed a correlation with food - if he does NOT eat, his pressure stays fine. If he DOES eat, his pressure drops and he feels awful (dizzy, "unsure of self") for about 45-90 minutes and the symptoms resolve on their own.  He saw his PCP who will start B12 shots and said that will help, but told him to call and schedule and appointment with Cardiology. Confirmed with patient he is taking no anti-hypertensive drugs. Scheduled the patient with Cecilie Kicks next Friday. He will stay hydrated in the meantime and call if symptoms worsen.  He understands he will be called if Dr. Burt Knack has further instructions prior to the visit.

## 2021-02-27 ENCOUNTER — Ambulatory Visit (INDEPENDENT_AMBULATORY_CARE_PROVIDER_SITE_OTHER): Payer: Medicare Other | Admitting: *Deleted

## 2021-02-27 ENCOUNTER — Other Ambulatory Visit: Payer: Self-pay

## 2021-02-27 DIAGNOSIS — E538 Deficiency of other specified B group vitamins: Secondary | ICD-10-CM | POA: Diagnosis not present

## 2021-02-27 MED ORDER — CYANOCOBALAMIN 1000 MCG/ML IJ SOLN
1000.0000 ug | Freq: Once | INTRAMUSCULAR | Status: AC
  Administered 2021-02-27: 1000 ug via INTRAMUSCULAR

## 2021-02-27 NOTE — Progress Notes (Signed)
Patient presents for B12 injection today. Patient received her B12 injection in left Deltoid. Patient tolerated injection well.  Documentation entered in MAR in EpicCare.   

## 2021-03-05 NOTE — Progress Notes (Signed)
Cardiology Office Note   Date:  03/10/2021   ID:  Bryan Armstrong, DOB [birth year], MRN [id number]  PCP:  Vivi Barrack, MD  Cardiologist:  Dr. Burt Knack    Chief Complaint  Patient presents with  . Hypotension      History of Present Illness: Bryan Armstrong is a 86 y.o. male who presents for HTN, bradycardia.  Last visit 07/24/20   He has a hx of hypertension, bradycardia, and peripheral arterial disease.  He has been seen by Dr. Gwenlyn Found in the past and undergone peripheral vascular angiography followed by right femoral endarterectomy surgeries by Dr. Oneida Alar.  He then experienced a dizziness and was felt to have suffered a posterior circulation TIA. However, a follow-up MRI showed no significant abnormality other than expected 'age-related' changes. He is planning on moving forward with left femoral endarterectomy  09/10/20.   The patient is a former smoker, quit in 2018.  Today pt seen for hypotension after he called with low BP.  Pt has had at times since 04/2020 at that time MRI of head and no CVA and no carotid disease.  Monitor with  sR to SB no significant arrhythmias. He has low B12 and has just started b12 injections.  With his vascular surgery BP was stable but pt tells me today he has episodic hypotension usually worse after BK.  He uses decaffeinated  coffee.    With the low BP he is lightheaded. He also has decreased vision in his Rt eye which was present in July that clears once BP improves and lightheadedness resolves. He was treated for vertigo but meclizine did not help.  Once playing golf he hd to lie down he was so lightheaded.    BP low on arrival but asymptomatic.  He is on no BP medications.  BP layig down 94/50 P 50, sitting he became dizzy and BP 81.58 P 58    Standing 65/50 P 60 and no dizziness Standing 3 min. 63/48 p 61   Today Dr. Caryl Comes saw pt and his concern in July 2021 and now no change in HR with drop in BP suggesting autonomic insuff.   With Valsalva  today connected to monitor EKG  Done by Dr. Caryl Comes no change with HR phase II,  Phase 4 HR up 30%.     Past Medical History:  Diagnosis Date  . BRADYCARDIA 06/01/2007  . Cataract    bil cataracts removed  . COPD (chronic obstructive pulmonary disease) ([place])   . EMPHYSEMA 03/28/2008  . Emphysema of lung ([place])   . GERD (gastroesophageal reflux disease)   . HYPERGLYCEMIA 06/01/2007  . HYPERLIPIDEMIA 06/01/2007  . Hypertension   . HYPERTENSION 01/19/2009  . Peripheral arterial disease ([place])   . TIA (transient ischemic attack)     Past Surgical History:  Procedure Laterality Date  . ABDOMINAL AORTOGRAM W/LOWER EXTREMITY N/A 03/05/2020   Procedure: ABDOMINAL AORTOGRAM W/  LOWER EXTREMITY;  Surgeon: Lorretta Harp, MD;  Location: [facility];  Service: Cardiovascular;  Laterality: N/A;  . CATARACT EXTRACTION, BILATERAL    . COLONOSCOPY    . ENDARTERECTOMY FEMORAL Right 04/03/2020   Procedure: Right Femoral Endarterectomy;  Surgeon: Elam Dutch, MD;  Location: [facility];  Service: Vascular;  Laterality: Right;  . ENDARTERECTOMY FEMORAL Left 09/10/2020   Procedure: ENDARTERECTOMY FEMORAL LEFT;  Surgeon: Elam Dutch, MD;  Location: [facility];  Service: Vascular;  Laterality: Left;  . EYE SURGERY     Bilaterl cataracts  .  PATCH ANGIOPLASTY Left 09/10/2020   Procedure: PATCH ANGIOPLASTY OF LEFT FEMORAL ARTERY USING HEMASHIELD PLATINUM FINESSE PATCH;  Surgeon: Elam Dutch, MD;  Location: [facility];  Service: Vascular;  Laterality: Left;  . POLYPECTOMY    . TONSILLECTOMY AND ADENOIDECTOMY       Current Outpatient Medications  Medication Sig Dispense Refill  . aspirin 81 MG tablet Take 81 mg by mouth daily.     Marland Kitchen EPINEPHrine (EPIPEN 2-PAK) 0.3 mg/0.3 mL IJ SOAJ injection Inject 0.3 mLs (0.3 mg total) into the muscle as needed for anaphylaxis. 1 each 0  . fluticasone (FLONASE) 50 MCG/ACT nasal spray Place 2 sprays into both nostrils daily as needed for allergies or rhinitis.    .  fluticasone-salmeterol (ADVAIR) 250-50 MCG/ACT AEPB Inhale 1 puff into the lungs every 12 (twelve) hours. 60 each 11  . HYDROcodone-acetaminophen (NORCO/VICODIN) 5-325 MG tablet Take 1 tablet by mouth every 6 (six) hours as needed for moderate pain. 20 tablet 0  . meclizine (ANTIVERT) 25 MG tablet Take 1 tablet (25 mg total) by mouth 3 (three) times daily as needed for dizziness. 30 tablet 0  . midodrine (PROAMATINE) 5 MG tablet Take 1 tablet (5 mg total) by mouth daily before breakfast. Take 1 tablet by mouth daily before breakfast. If your blood pressure is running low, take 1 tablet by mouth every 4 hours as needed but do not exceed more than 3 tablets a day. 60 tablet 6  . Multiple Vitamins-Minerals (PRESERVISION AREDS 2 PO) Take 1 capsule by mouth daily.    . rosuvastatin (CRESTOR) 20 MG tablet Take 20 mg by mouth at bedtime.     No current facility-administered medications for this visit.    Allergies:   Bee venom and Meclizine    Social History:  The patient  reports that he quit smoking about 3 years ago. His smoking use included cigarettes. He has a 20.00 pack-year smoking history. He has never used smokeless tobacco. He reports that he does not drink alcohol and does not use drugs.   Family History:  The patient's family history includes Cancer in his mother; Heart disease in his father; Uterine cancer in his mother.    ROS:  General:no colds or fevers, no weight changes Skin:no rashes or ulcers HEENT:no blurred vision, no congestion CV:see HPI PUL:see HPI GI:no diarrhea constipation or melena, no indigestion GU:no hematuria, no dysuria MS:no joint pain, no claudication Neuro:no syncope, + lightheadedness see HPI Endo:no diabetes, no thyroid disease  Wt Readings from Last 3 Encounters:  03/08/21 206 lb (93.4 kg)  02/19/21 205 lb 6.4 oz (93.2 kg)  02/13/21 205 lb 12.8 oz (93.4 kg)     PHYSICAL EXAM: VS:  BP 90/62   Pulse 64   Ht 5\' 11"  (1.803 m)   Wt 206 lb (93.4 kg)    SpO2 96%   BMI 28.73 kg/m  , BMI Body mass index is 28.73 kg/m. General:Pleasant affect, NAD Skin:Warm and dry, brisk capillary refill HEENT:normocephalic, sclera clear, mucus membranes moist Neck:supple, no JVD, no bruits  Heart:S1S2 RRR without murmur, gallup, rub or click Lungs:clear without rales, rhonchi, or wheezes DDU:KGUR, non tender, + BS, do not palpate liver spleen or masses Ext:no lower ext edema, 2+ pedal pulses, 2+ radial pulses Neuro:alert and oriented X 3, MAE, follows commands, + facial symmetry    EKG:  EKG is ordered today. The ekg ordered today demonstrates SB at 3 with RBBB and no acute EKG changes   Recent Labs: 02/19/2021: ALT  18; BUN 22; Creatinine, Ser 1.09; Hemoglobin 13.5; Platelets 184.0; Potassium 4.4; Sodium 140; TSH 1.78    Lipid Panel    Component Value Date/Time   CHOL 114 02/19/2021 1452   TRIG 130.0 02/19/2021 1452   HDL 35.90 (L) 02/19/2021 1452   CHOLHDL 3 02/19/2021 1452   VLDL 26.0 02/19/2021 1452   LDLCALC 52 02/19/2021 1452       Other studies Reviewed: Additional studies/ records that were reviewed today include: Marland Kitchen Monitor with SR/SB and ST no significant arrhythmias noted.  Echo 04-18-2020: IMPRESSIONS    1. Left ventricular ejection fraction, by estimation, is 60 to 65%. The  left ventricle has normal function. The left ventricle has no regional  wall motion abnormalities. Left ventricular diastolic parameters were  normal.  2. Right ventricular systolic function is normal. The right ventricular  size is normal.  3. Left atrial size was mildly dilated.  4. The mitral valve is normal in structure. Trivial mitral valve  regurgitation. No evidence of mitral stenosis.  5. The aortic valve is normal in structure. Aortic valve regurgitation is  not visualized. Mild aortic valve sclerosis is present, with no evidence  of aortic valve stenosis.  6. The inferior vena cava is normal in size with greater than 50%   respiratory variability, suggesting right atrial pressure of 3 mmHg.   Myoview Stress Test 03-21-2020: Study Highlights   Nuclear stress EF: 59%.  There was no ST segment deviation noted during stress.  The study is normal.  This is a low risk study.  The left ventricular ejection fraction is normal (55-65%).    ASSESSMENT AND PLAN:  1.  Hypotension, mostly compensated and some post prandial hypotension.   Per Dr. Caryl Comes will add Midodrine 5 mg every day prior to BK check BP prior to BK then check BP 1 hour after BK.  Keep record    If BP low during the day midodrine 5 mg every 4 hours prn up to total of 3 per day.   We will have him wear 3 day zio patch as well.   Follow up in 1 month-scheduled with Dr. Caryl Comes.  Today visit pt was seen for 1 hour between Dr. Caryl Comes and myself. From 9:15 until 1030 at least.  Encouraged to drink caffeine with coffee  2. HLD on crestor stable.   3.  PAD followed by Dr. Oneida Alar.   4.  B12 insuff.  Now on injections.   5. Episodic decrease vision in Rt eye    To see opthalmology  in next few weeks.     Current medicines are reviewed with the patient today.  The patient Has no concerns regarding medicines.  The following changes have been made:  See above Labs/ tests ordered today include:see above  Disposition:   FU:  see above  Signed, Cecilie Kicks, NP  03/10/2021 10:39 PM    [address], [place] [place], [address], [place] Phone: [phone]; Fax: [phone]

## 2021-03-06 ENCOUNTER — Ambulatory Visit (INDEPENDENT_AMBULATORY_CARE_PROVIDER_SITE_OTHER): Payer: Medicare Other | Admitting: *Deleted

## 2021-03-06 ENCOUNTER — Other Ambulatory Visit: Payer: Self-pay

## 2021-03-06 DIAGNOSIS — E538 Deficiency of other specified B group vitamins: Secondary | ICD-10-CM | POA: Diagnosis not present

## 2021-03-06 MED ORDER — CYANOCOBALAMIN 1000 MCG/ML IJ SOLN
1000.0000 ug | Freq: Once | INTRAMUSCULAR | Status: AC
  Administered 2021-03-06: 1000 ug via INTRAMUSCULAR

## 2021-03-06 NOTE — Progress Notes (Signed)
Patient presents for B12 injection today. Patient received her B12 injection in Left  Deltoid. Patient tolerated injection well.  Documentation entered in MAR in Epic[employer].   

## 2021-03-08 ENCOUNTER — Encounter: Payer: Self-pay | Admitting: Cardiology

## 2021-03-08 ENCOUNTER — Encounter: Payer: Self-pay | Admitting: Radiology

## 2021-03-08 ENCOUNTER — Ambulatory Visit (INDEPENDENT_AMBULATORY_CARE_PROVIDER_SITE_OTHER): Payer: Medicare Other

## 2021-03-08 ENCOUNTER — Other Ambulatory Visit: Payer: Self-pay

## 2021-03-08 ENCOUNTER — Ambulatory Visit: Payer: Medicare Other | Admitting: Cardiology

## 2021-03-08 VITALS — BP 90/62 | HR 64 | Ht 71.0 in | Wt 206.0 lb

## 2021-03-08 DIAGNOSIS — I951 Orthostatic hypotension: Secondary | ICD-10-CM

## 2021-03-08 DIAGNOSIS — R001 Bradycardia, unspecified: Secondary | ICD-10-CM

## 2021-03-08 DIAGNOSIS — I739 Peripheral vascular disease, unspecified: Secondary | ICD-10-CM

## 2021-03-08 DIAGNOSIS — E782 Mixed hyperlipidemia: Secondary | ICD-10-CM

## 2021-03-08 MED ORDER — MIDODRINE HCL 5 MG PO TABS: 5.0000 mg | ORAL_TABLET | Freq: Every day | ORAL | 6 refills | Status: DC

## 2021-03-08 NOTE — Progress Notes (Signed)
Enrolled patient for a 3 day Zio XT monitor to be mailed to patients home  

## 2021-03-08 NOTE — Patient Instructions (Addendum)
Medication Instructions:  Start Midodrine 5 mg daily before breakfast. If your blood pressure is running low, take 1 tablet by mouth every 4 hours as needed but do not exceed more than 3 tablets a day.   *If you need a refill on your cardiac medications before your next appointment, please call your pharmacy*   Lab Work: Return for labs on 03/13/21  If you have labs (blood work) drawn today and your tests are completely normal, you will receive your results only by: Marland Kitchen MyChart Message (if you have MyChart) OR . A paper copy in the mail If you have any lab test that is abnormal or we need to change your treatment, we will call you to review the results.   Testing/Procedures: A zio monitor was ordered today. It will remain on for 3 days. You will then return monitor and event diary in provided box. It takes 1-2 weeks for report to be downloaded and returned to [place]. We will call you with the results. If monitor falls off or has orange flashing light, please call Zio for further instructions.    Follow-Up: You have been referred to see Dr. Virl Axe in 1 month  Other Instructions check blood pressure prior to breakfast, then check blood pressure 1 hour after breakfast.  Keep record. Keep hydrated as well    ZIO XT- Long Term Monitor Instructions   Your physician has requested you wear a ZIO patch monitor for 3 days.  This is a single patch monitor.   [employer] supplies one patch monitor per enrollment. Additional stickers are not available. Please do not apply patch if you will be having a Nuclear Stress Test, Echocardiogram, Cardiac CT, MRI, or Chest Xray during the period you would be wearing the monitor. The patch cannot be worn during these tests. You cannot remove and re-apply the ZIO XT patch monitor.  Your ZIO patch monitor will be sent Fed Ex from [employer] directly to your home address. It may take 3-5 days to receive your monitor after you have been enrolled.  Once you  have received your monitor, please review the enclosed instructions. Your monitor has already been registered assigning a specific monitor serial # to you.  Billing and Patient Assistance Program Information   We have supplied [employer] with any of your insurance information on file for billing purposes. [employer] offers a sliding scale Patient Assistance Program for patients that do not have insurance, or whose insurance does not completely cover the cost of the ZIO monitor.   You must apply for the Patient Assistance Program to qualify for this discounted rate.     To apply, please call [employer] at [phone], select option 4, then select option 2, and ask to apply for Patient Assistance Program.  Theodore Demark will ask your household income, and how many people are in your household.  They will quote your out-of-pocket cost based on that information.  [employer] will also be able to set up a 47-month, interest-free payment plan if needed.  Applying the monitor   Shave hair from upper left chest.  Hold abrader disc by orange tab. Rub abrader in 40 strokes over the upper left chest as indicated in your monitor instructions.  Clean area with 4 enclosed alcohol pads. Let dry.  Apply patch as indicated in monitor instructions. Patch will be placed under collarbone on left side of chest with arrow pointing upward.  Rub patch adhesive wings for 2 minutes. Remove white label marked "1". Remove the white  label marked "2". Rub patch adhesive wings for 2 additional minutes.  While looking in a mirror, press and release button in center of patch. A small green light will flash 3-4 times. This will be your only indicator that the monitor has been turned on. ?  Do not shower for the first 24 hours. You may shower after the first 24 hours.  Press the button if you feel a symptom. You will hear a small click. Record Date, Time and Symptom in the Patient Logbook.  When you are ready to remove the patch, follow instructions  on the last 2 pages of the Patient Logbook. Stick patch monitor onto the last page of Patient Logbook.  Place Patient Logbook in the blue and white box.  Use locking tab on box and tape box closed securely.  The blue and white box has prepaid postage on it. Please place it in the mailbox as soon as possible. Your physician should have your test results approximately 7 days after the monitor has been mailed back to [facility].  Call [facility] at [phone] if you have questions regarding your ZIO XT patch monitor. Call them immediately if you see an orange light blinking on your monitor.  If your monitor falls off in less than 4 days, contact our Monitor department at [phone]. ?If your monitor becomes loose or falls off after 4 days call [employer] at [phone] for suggestions on securing your monitor.?

## 2021-03-10 ENCOUNTER — Encounter: Payer: Self-pay | Admitting: Cardiology

## 2021-03-12 DIAGNOSIS — R001 Bradycardia, unspecified: Secondary | ICD-10-CM

## 2021-03-13 ENCOUNTER — Ambulatory Visit: Payer: Medicare Other

## 2021-03-13 ENCOUNTER — Other Ambulatory Visit: Payer: Medicare Other

## 2021-03-13 ENCOUNTER — Other Ambulatory Visit: Payer: Self-pay

## 2021-03-13 DIAGNOSIS — I951 Orthostatic hypotension: Secondary | ICD-10-CM

## 2021-03-13 LAB — BASIC METABOLIC PANEL
BUN/Creatinine Ratio: 17 (ref 10–24)
BUN: 20 mg/dL (ref 8–27)
CO2: 22 mmol/L (ref 20–29)
Calcium: 8.7 mg/dL (ref 8.6–10.2)
Chloride: 105 mmol/L (ref 96–106)
Creatinine, Ser: 1.15 mg/dL (ref 0.76–1.27)
Glucose: 144 mg/dL — ABNORMAL HIGH (ref 65–99)
Potassium: 4.3 mmol/L (ref 3.5–5.2)
Sodium: 141 mmol/L (ref 134–144)
eGFR: 63 mL/min/{1.73_m2} (ref >59)

## 2021-03-14 ENCOUNTER — Telehealth: Payer: Self-pay | Admitting: Cardiovascular Disease

## 2021-03-14 NOTE — Telephone Encounter (Signed)
Spoke with the patient about this midodrine. His BP was higher today (systolic 567-209) and asymptomatic. Instructed him to hold midodrine unless his systolic BP is under 198. Reviewed instructions (take 1 tablet q4h PRN not to exceed 3 tablets in a day).  He was grateful for call and agrees with plan.   He has an appointment to establish with Dr. Caryl Comes 04/18/21.  He understands BP parameters will be confirmed with Cecilie Kicks and he will be called back to confirm.

## 2021-03-14 NOTE — Telephone Encounter (Signed)
Patient called in to see if he needs to continue to take his bp medicine being that his bp is up now. Please advise

## 2021-03-14 NOTE — Telephone Encounter (Signed)
Pt c/o BP issue: STAT if pt c/o blurred vision, one-sided weakness or slurred speech  1. What are your last 5 BP readings? This morning top number was in 150's before breakfast, 108/68 now  2. Are you having any other symptoms (ex. Dizziness, headache, blurred vision, passed out)? No symptoms  3. What is your BP issue? Patient states his BP was high this morning, but his medications are for low BP. He states he still took the medications, but now it is 108/68 and thinks that may be too low. Please advise.

## 2021-03-14 NOTE — Telephone Encounter (Signed)
See other 03/14/21 phone note.

## 2021-03-15 NOTE — Telephone Encounter (Signed)
Returned call to pt, left a message for him to call back.

## 2021-03-19 ENCOUNTER — Ambulatory Visit: Payer: Medicare Other | Admitting: Adult Health

## 2021-03-19 NOTE — Telephone Encounter (Signed)
Spoke with pt regarding phone message below and reiterated to pt to continue to monitor his bp every morning, and that Mickel Baas was ok with his systolic being 844'B that day, especially with the problems he has been having of it dropping.  Pt thanked me for the follow-up call.

## 2021-03-21 DIAGNOSIS — H5201 Hypermetropia, right eye: Secondary | ICD-10-CM | POA: Diagnosis not present

## 2021-03-21 DIAGNOSIS — H52223 Regular astigmatism, bilateral: Secondary | ICD-10-CM | POA: Diagnosis not present

## 2021-03-21 DIAGNOSIS — R001 Bradycardia, unspecified: Secondary | ICD-10-CM | POA: Diagnosis not present

## 2021-03-21 DIAGNOSIS — H524 Presbyopia: Secondary | ICD-10-CM | POA: Diagnosis not present

## 2021-03-21 DIAGNOSIS — Z961 Presence of intraocular lens: Secondary | ICD-10-CM | POA: Diagnosis not present

## 2021-03-21 DIAGNOSIS — G453 Amaurosis fugax: Secondary | ICD-10-CM | POA: Diagnosis not present

## 2021-03-21 DIAGNOSIS — H35033 Hypertensive retinopathy, bilateral: Secondary | ICD-10-CM | POA: Diagnosis not present

## 2021-04-16 ENCOUNTER — Encounter: Payer: Self-pay | Admitting: Adult Health

## 2021-04-16 ENCOUNTER — Ambulatory Visit: Payer: Medicare Other | Admitting: Adult Health

## 2021-04-16 VITALS — BP 102/60 | HR 59 | Ht 71.0 in | Wt 204.0 lb

## 2021-04-16 DIAGNOSIS — G459 Transient cerebral ischemic attack, unspecified: Secondary | ICD-10-CM | POA: Diagnosis not present

## 2021-04-16 DIAGNOSIS — E538 Deficiency of other specified B group vitamins: Secondary | ICD-10-CM

## 2021-04-16 DIAGNOSIS — E785 Hyperlipidemia, unspecified: Secondary | ICD-10-CM | POA: Diagnosis not present

## 2021-04-16 DIAGNOSIS — G629 Polyneuropathy, unspecified: Secondary | ICD-10-CM | POA: Diagnosis not present

## 2021-04-16 DIAGNOSIS — I951 Orthostatic hypotension: Secondary | ICD-10-CM | POA: Insufficient documentation

## 2021-04-16 NOTE — Progress Notes (Signed)
[facility] [address] [place]. [place] [address] [phone]       OFFICE FOLLOW-UP NOTE  [name] Date of Birth:  [birth year] Medical Record Number:  [id number]    Chief Complaint  Patient presents with   Follow-up    RM 3 alone Pt is well, getting more numbness in feet but overall stable       HPI:   Today, 04/16/2021, Bryan Armstrong returns for 68-month follow-up.  Stable from stroke standpoint without new stroke/TIA symptoms.  He did have 1 episode of vertigo back in April which subsided after performing Epley maneuver -no additional episodes since that time.  His greatest concern today is in regards to gradually worsening BLE numbness/tingling present at nighttime -denies associated pain. PCP recently checked lab work (02/19/2021) with B12 212 and started on monthly injections with last injection 5/27.  He believes he may have had some benefit of symptoms after B12 injection.  Continues to follow with cardiology for symptomatic hypotension and started on midodrine on 5/27 - he reports taking approx 6-7 pills since starting using more frequently in the first 2 weeks. He does have follow up with Dr. Caryl Comes 7/7. BP has been more regulated where he has not been experiencing low blood pressure.  Blood pressure today 102/60.  Compliant on aspirin and Crestor without associated side effects.  No further concerns at this time.    History provided for reference purposes only Update 11/19/2020 JM: Bryan Armstrong returns for TIA follow-up as well as concerns of dizziness/vertigo unaccompanied.  Reports episode of vertigo 1 week ago where he felt as though he was getting pulled towards the right side and was able to lower himself to his knees as he felt as though he was going to fall.  This sensation lasted for 2 to 3 minutes and then subsided.  He laid down in his bed and took a nap for approximately 20 minutes with resolution of symptoms upon awakening.  Denies any other  associated symptoms such as weakness, numbness/tingling, visual changes, speech changes, facial weakness or headaches.  He was evaluated by ENT for continued vertigo (unable to view via epic) - per patient, told left inner ear issue and referred to PT for vestibular rehab.  He has been working with neuro rehab PT with improvement of symptoms.  He is able to turn his head more without provoking symptoms and symptoms typically progress with quick head movements.  He did trial meclizine x1 with increased fatigue without noticeable benefit.  Otherwise, he has been stable from a stroke standpoint.  Reports ongoing compliance with aspirin 81 mg daily and Crestor 20 mg daily and denies side effects.  Blood pressure today 127/77.  Previously having issues with hypotension therefore PCP discontinued Maxzide and blood pressure has been slowly stabilizing.  He will occasionally have low blood pressure still where he will feel lightheaded in 1 to 2 seconds OD visual change which are typical symptoms of low blood pressure for patient.  No further concerns at this time.  Initial visit 06/19/2020 Dr. Leonie Man: Bryan Armstrong is a 86 year old Caucasian male seen today for initial office follow-up visit following hospital consultation for TIA in July 2021.  History is obtained from the patient, review of electronic medical records and I personally reviewed available imaging films in PACS.  He has past medical history for hyperlipidemia, hypertension, peripheral arterial disease, COPD, bradycardia with history of syncope who presented to [facility] on 04/17/2020 with sudden onset of  gait instability.  He felt suddenly off balance and was leaning to the right side and slammed into a wall.  He denied any accompanying vertigo, room spinning, lightheadedness blurred vision no slurred speech or double vision.  This lasted about 5 minutes and then gradually returned back to normal.  He stated he had a previous episode 10 years ago of  suddenly collapsing after getting up from the dining table at a restaurant and was felt to have had a vasovagal syncope at that time.  He does have history of peripheral arterial disease and underwent right femoral endarterectomy on 04/03/2020 with Dr. Oneida Alar and left-sided endarterectomy is also planned.  MRI scan of the brain obtained in the hospital was negative for acute infarct and showed small changes of small vessel disease CT angiogram showed no significant extracranial intracranial large vessel stenosis or occlusion.  LDL cholesterol 57 mg percent and hemoglobin A1c was 5.7.  Transthoracic echo showed normal ejection fraction without cardiac source of embolism.  Outpatient cardiac Holter monitoring showed no significant cardiac arrhythmias.  Patient states he has had episodes of hypotension which are manifested with blurred vision and feeling tired and fuzzy headed.  The has noticed this happen multiple times when his blood pressure has been below 195 systolic.  He does take Maxide about 2 or 3 days a week when his blood pressure is running in the 150s.  He seems to be exquisitely sensitive to this.  He plans to discuss with his primary care physician alternative blood pressure management strategies.  He admits to not drinking enough fluids.  He was scheduled to undergo dental surgery soon which she hopes will solve his problems.  He has had no recurrent stroke or TIA symptoms.  He remains on Crestor 20 mg he is tolerating well without muscle aches and pains.     ROS:   14 system review of systems is positive for those listed in HPI and all other systems negative  PMH:  Past Medical History:  Diagnosis Date   BRADYCARDIA 06/01/2007   Cataract    bil cataracts removed   COPD (chronic obstructive pulmonary disease) ([place])    EMPHYSEMA 03/28/2008   Emphysema of lung (HCC)    GERD (gastroesophageal reflux disease)    HYPERGLYCEMIA 06/01/2007   HYPERLIPIDEMIA 06/01/2007   Hypertension     HYPERTENSION 01/19/2009   Peripheral arterial disease (HCC)    TIA (transient ischemic attack)     Social History:  Social History   Socioeconomic History   Marital status: Married    Spouse name: Not on file   Number of children: Not on file   Years of education: Not on file   Highest education level: Not on file  Occupational History   Occupation: Retired   Tobacco Use   Smoking status: Former    Packs/day: 0.50    Years: 40.00    Pack years: 20.00    Types: Cigarettes    Quit date: 09/26/2017    Years since quitting: 3.5   Smokeless tobacco: Never   Tobacco comments:    stopped 2018  Vaping Use   Vaping Use: Never used  Substance and Sexual Activity   Alcohol use: No   Drug use: No   Sexual activity: Not on file  Other Topics Concern   Not on file  Social History Narrative   Lives at home with wife   Right handed   Caffeine: drinks de-caf drinks, diet drinks if its a coke.   Social  Determinants of Health   Financial Resource Strain: Not on file  Food Insecurity: Not on file  Transportation Needs: Not on file  Physical Activity: Not on file  Stress: Not on file  Social Connections: Not on file  Intimate Partner Violence: Not on file    Medications:   Current Outpatient Medications on File Prior to Visit  Medication Sig Dispense Refill   aspirin 81 MG tablet Take 81 mg by mouth daily.      EPINEPHrine (EPIPEN 2-PAK) 0.3 mg/0.3 mL IJ SOAJ injection Inject 0.3 mLs (0.3 mg total) into the muscle as needed for anaphylaxis. 1 each 0   fluticasone (FLONASE) 50 MCG/ACT nasal spray Place 2 sprays into both nostrils daily as needed for allergies or rhinitis.     fluticasone-salmeterol (ADVAIR) 250-50 MCG/ACT AEPB Inhale 1 puff into the lungs every 12 (twelve) hours. 60 each 11   midodrine (PROAMATINE) 5 MG tablet Take 1 tablet (5 mg total) by mouth daily before breakfast. Take 1 tablet by mouth daily before breakfast. If your blood pressure is running low, take 1  tablet by mouth every 4 hours as needed but do not exceed more than 3 tablets a day. 60 tablet 6   Multiple Vitamins-Minerals (PRESERVISION AREDS 2 PO) Take 1 capsule by mouth daily.     rosuvastatin (CRESTOR) 20 MG tablet Take 20 mg by mouth at bedtime.     HYDROcodone-acetaminophen (NORCO/VICODIN) 5-325 MG tablet Take 1 tablet by mouth every 6 (six) hours as needed for moderate pain. (Patient not taking: No sig reported) 20 tablet 0   No current facility-administered medications on file prior to visit.    Allergies:   Allergies  Allergen Reactions   Bee Venom Anaphylaxis    Yellow jackets   Meclizine     Fatigue     Physical Exam Today's Vitals   04/16/21 1406  BP: 102/60  Pulse: (!) 59  Weight: 204 lb (92.5 kg)  Height: 5\' 11"  (1.803 m)    Body mass index is 28.45 kg/m.   General: well developed, well nourished pleasant elderly Caucasian male, seated, in no evident distress Head: head normocephalic and atraumatic.  Neck: supple with no carotid or supraclavicular bruits Cardiovascular: regular rate and rhythm, no murmurs Musculoskeletal: no deformity Skin:  no rash/petichiae Vascular:  Normal pulses all extremities  Neurologic Exam Mental Status: Awake and fully alert.  Fluent speech and language.  Oriented to place and time. Recent and remote memory intact. Attention span, concentration and fund of knowledge appropriate. Mood and affect appropriate.  Cranial Nerves: Pupils equal, briskly reactive to light. Extraocular movements full without nystagmus. Visual fields full to confrontation. Hearing slightly diminished bilaterally. Facial sensation intact. Face, tongue, palate moves normally and symmetrically.  Motor: Normal bulk and tone. Normal strength in all tested extremity muscles. Sensory.: intact to touch ,pinprick .position and vibratory sensation.  Coordination: Rapid alternating movements normal in all extremities. Finger-to-nose and heel-to-shin performed  accurately bilaterally. Gait and Station: Arises from chair without difficulty. Stance is normal. Gait demonstrates normal stride length and balance. Able to heel, toe and tandem walk with only slight difficulty. Reflexes: 1+ and symmetric. Toes downgoing.       ASSESSMENT/PLAN: 86 year old male with posterior circulation TIA in July 2021 from small vessel disease.  Vascular risk factors of hypertension hyperlipidemia, peripheral arterial disease, BPPV and age. New complaint of gradual worsening bilateral toe numbness/tingling    TIA : Continue aspirin 81 mg daily  and Crestor for secondary stroke prevention.  Discussed secondary stroke prevention measures and importance of close PCP f/u for aggressive stroke risk factor management  Vertigo: stable after completion of vestibular rehab.  Discussed use of Epley maneuver at home but he started to experience recurrence of symptoms that he is not able to manage, may consider additional sessions of vestibular rehab Neuropathy: present bilateral toes -no evidence of sensory loss.  High suspicion symptoms in setting of B12 deficiency with recent B12 level 212 and some improvement after B12 injection but did not have repeat injections as recommended by PCP.  Repeat B12 level today and depending on result, may consider starting p.o. supplementation vs possible need of additional injections.  If symptoms persist or worsen despite normalizing B12 level, may need to pursue further evaluation with EMG/NCV but currently patient wishes to hold off to see if symptoms improve. Hx of pre-DM and PAD which may also be a contributing factor Hypotension w/ hx of HTN: Continue midodrine 5 mg daily for symptomatic hypotension. Has f/u with Dr. Caryl Comes 7/7 HLD: LDL goal <70. On Crestor 20 mg daily per PCP    Follow-up in 4 months or call earlier if needed    CC:  [place] provider: Dr. Julian Reil, Algis Greenhouse, MD   I spent 36 minutes of face-to-face and non-face-to-face  time with patient.  This included previsit chart review, lab review, study review, order entry, electronic health record documentation, patient education and discussion regarding hx of TIA and secondary stroke prevention measures and importance of aggressive stroke risk factor management, hx of BPPV, and new concerns of worsening neuropathy with possible etiologies and further treatment options as well as further evaluation and answered all other questions to patients satisfaction  Frann Rider, AGNP-BC  [facility] [address] [place], [place] [address]  Phone [phone] Fax [phone] Note: This document was prepared with digital dictation and possible smart phrase technology. Any transcriptional errors that result from this process are unintentional.

## 2021-04-16 NOTE — Patient Instructions (Addendum)
Check b12 level today - we may consider doing additional testing if your symptoms do not improve with correcting B12 level   Continue aspirin and Crestor for stroke prevention burning sensation electrical shock type sensation    Follow up in 4 months or call earlier if needed     History of TIA history of TIA Peripheral Neuropathy Peripheral neuropathy is a type of nerve damage. It affects nerves that carry signals between the spinal cord and the arms, legs, and the rest of the body (peripheral nerves). It does not affect nerves in the spinal cord or brain. In peripheral neuropathy, one nerve or a group of nerves may be damaged. Peripheral neuropathy is a broad category that includes many specific nerve disorders,like diabetic neuropathy, hereditary neuropathy, and carpal tunnel syndrome. What are the causes? This condition may be caused by: Diabetes. This is the most common cause of peripheral neuropathy. Nerve injury. Pressure or stress on a nerve that lasts a long time. Lack (deficiency) of B vitamins. This can result from alcoholism, poor diet, or a restricted diet. Infections. Autoimmune diseases, such as rheumatoid arthritis and systemic lupus erythematosus. Nerve diseases that are passed from parent to child (inherited). Some medicines, such as cancer medicines (chemotherapy). Poisonous (toxic) substances, such as lead and mercury. Too little blood flowing to the legs. Kidney disease. Thyroid disease. In some cases, the cause of this condition is not known. What are the signs or symptoms? Symptoms of this condition depend on which of your nerves is damaged. Common symptoms include: Loss of feeling (numbness) in the feet, hands, or both. Tingling in the feet, hands, or both. Burning pain. Very sensitive skin. Weakness. Not being able to move a part of the body (paralysis). Muscle twitching. Clumsiness or poor coordination. Loss of balance. Not being able to control your  bladder. Feeling dizzy. Sexual problems. How is this diagnosed? Diagnosing and finding the cause of peripheral neuropathy can be difficult. Your health care provider will take your medical history and do a physical exam. A neurological exam will also be done. This involves checking things that are affected by your brain, spinal cord, and nerves (nervous system). For example, your health care provider will check your reflexes, how youmove, and what you can feel. You may have other tests, such as: Blood tests. Electromyogram (EMG) and nerve conduction tests. These tests check nerve function and how well the nerves are controlling the muscles. Imaging tests, such as CT scans or MRI to rule out other causes of your symptoms. Removing a small piece of nerve to be examined in a lab (nerve biopsy). Removing and examining a small amount of the fluid that surrounds the brain and spinal cord (lumbar puncture). How is this treated? Treatment for this condition may involve: Treating the underlying cause of the neuropathy, such as diabetes, kidney disease, or vitamin deficiencies. Stopping medicines that can cause neuropathy, such as chemotherapy. Medicine to help relieve pain. Medicines may include: Prescription or over-the-counter pain medicine. Antiseizure medicine. Antidepressants. Pain-relieving patches that are applied to painful areas of skin. Surgery to relieve pressure on a nerve or to destroy a nerve that is causing pain. Physical therapy to help improve movement and balance. Devices to help you move around (assistive devices). Follow these instructions at home: Medicines Take over-the-counter and prescription medicines only as told by your health care provider. Do not take any other medicines without first asking your health care provider. Do not drive or use heavy machinery while taking prescription pain medicine.  Lifestyle  Do not use any products that contain nicotine or tobacco, such  as cigarettes and e-cigarettes. Smoking keeps blood from reaching damaged nerves. If you need help quitting, ask your health care provider. Avoid or limit alcohol. Too much alcohol can cause a vitamin B deficiency, and vitamin B is needed for healthy nerves. Eat a healthy diet. This includes: Eating foods that are high in fiber, such as fresh fruits and vegetables, whole grains, and beans. Limiting foods that are high in fat and processed sugars, such as fried or sweet foods.  General instructions  If you have diabetes, work closely with your health care provider to keep your blood sugar under control. If you have numbness in your feet: Check every day for signs of injury or infection. Watch for redness, warmth, and swelling. Wear padded socks and comfortable shoes. These help protect your feet. Develop a good support system. Living with peripheral neuropathy can be stressful. Consider talking with a mental health specialist or joining a support group. Use assistive devices and attend physical therapy as told by your health care provider. This may include using a walker or a cane. Keep all follow-up visits as told by your health care provider. This is important.  Contact a health care provider if: You have new signs or symptoms of peripheral neuropathy. You are struggling emotionally from dealing with peripheral neuropathy. Your pain is not well-controlled. Get help right away if: You have an injury or infection that is not healing normally. You develop new weakness in an arm or leg. You have fallen or do so frequently. Summary Peripheral neuropathy is when the nerves in the arms, or legs are damaged, resulting in numbness, weakness, or pain. There are many causes of peripheral neuropathy, including diabetes, pinched nerves, vitamin deficiencies, autoimmune disease, and hereditary conditions. Diagnosing and finding the cause of peripheral neuropathy can be difficult. Your health care  provider will take your medical history, do a physical exam, and do tests, including blood tests and nerve function tests. Treatment involves treating the underlying cause of the neuropathy and taking medicines to help control pain. Physical therapy and assistive devices may also help. This information is not intended to replace advice given to you by your health care provider. Make sure you discuss any questions you have with your healthcare provider. Document Revised: 07/10/2020 Document Reviewed: 07/10/2020 Elsevier Patient Education  2022 [employer].

## 2021-04-17 ENCOUNTER — Telehealth: Payer: Self-pay

## 2021-04-17 LAB — VITAMIN B12: Vitamin B-12: 431 pg/mL (ref 232–1245)

## 2021-04-17 NOTE — Telephone Encounter (Signed)
Contacted pt. LVM per DPR, stating that recent B12 level is 431 but increased from prior at 212.  Janett Billow recommend initiating vitamin B12 supplement 1000 mcg daily which he can purchase over-the-counter, which was discussed at yesterday's visit.  We will plan on rechecking levels at follow-up visit if PCP has not done so in the meantime. Advised to call the office back with questions

## 2021-04-17 NOTE — Progress Notes (Signed)
See telephone note from 04/17/21

## 2021-04-17 NOTE — Telephone Encounter (Signed)
-----   Message from Frann Rider, NP sent at 04/17/2021  1:50 PM EDT ----- Please advise patient that recent B12 level 431 but increased from prior at 212.  Recommend initiating vitamin B12 supplement 1000 mcg daily which he can purchase over-the-counter (this was discussed at yesterday's visit).  We will plan on rechecking levels at follow-up visit if PCP has not done so in the meantime

## 2021-04-18 ENCOUNTER — Encounter: Payer: Self-pay | Admitting: Internal Medicine

## 2021-04-18 ENCOUNTER — Ambulatory Visit: Payer: Medicare Other | Admitting: Cardiology

## 2021-04-18 ENCOUNTER — Other Ambulatory Visit: Payer: Self-pay

## 2021-04-18 ENCOUNTER — Ambulatory Visit: Payer: Medicare Other | Admitting: Internal Medicine

## 2021-04-18 VITALS — BP 114/69 | HR 61 | Ht 71.0 in | Wt 208.0 lb

## 2021-04-18 DIAGNOSIS — R001 Bradycardia, unspecified: Secondary | ICD-10-CM | POA: Diagnosis not present

## 2021-04-18 DIAGNOSIS — I951 Orthostatic hypotension: Secondary | ICD-10-CM

## 2021-04-18 MED ORDER — MIDODRINE HCL 2.5 MG PO TABS: 2.5000 mg | ORAL_TABLET | Freq: Three times a day (TID) | ORAL | 3 refills | Status: DC

## 2021-04-18 NOTE — Patient Instructions (Signed)
Medication Instructions:  Your physician has recommended you make the following change in your medication:  ** Stop Proamatine 5mg  ** Begin Proamatine 2.5mg  - 1 tablet by mouth at 8am, 12 noon and 4pm  *If you need a refill on your cardiac medications before your next appointment, please call your pharmacy*   Lab Work: None ordered.  If you have labs (blood work) drawn today and your tests are completely normal, you will receive your results only by: [place] (if you have MyChart) OR A paper copy in the mail If you have any lab test that is abnormal or we need to change your treatment, we will call you to review the results.   Testing/Procedures: None ordered.    Follow-Up: At [facility], you and your health needs are our priority.  As part of our continuing mission to provide you with exceptional heart care, we have created designated Provider Care Teams.  These Care Teams include your primary Cardiologist (physician) and Advanced Practice Providers (APPs -  Physician Assistants and Nurse Practitioners) who all work together to provide you with the care you need, when you need it.  We recommend signing up for the patient portal called "MyChart".  Sign up information is provided on this After Visit Summary.  MyChart is used to connect with patients for Virtual Visits (Telemedicine).  Patients are able to view lab/test results, encounter notes, upcoming appointments, etc.  Non-urgent messages can be sent to your provider as well.   To learn more about what you can do with MyChart, go to [email or web address].    Your next appointment:   3 months with Dr Caryl Comes

## 2021-04-18 NOTE — Progress Notes (Signed)
Patient ID: Bryan Armstrong, male   DOB: Feb 28, 1935, 86 y.o.   MRN: [id number]      ELECTROPHYSIOLOGY CONSULT NOTE  Patient ID: Bryan Armstrong, MRN: [id number], DOB/AGE: [birth year] 86 y.o. Admit date: (Not on file) Date of Consult: 04/18/2021  Primary Physician: Vivi Barrack, MD Primary Cardiologist: Edd Fabian     [name] is a 86 y.o. male who is being seen today for the evaluation of orthostatic hypotesion at the request of [facility].    HPI [name] is a 86 y.o. male seen in consultation for orthostatic hypotension.  He was seen briefly in conjunction with   LI-NP 5/22 when he was noted to be orthostatic and profoundly hypotensive with blood pressure systolic going from 62Z--30Q with standing.  He was started on ProAmatine 5 mg to be taken in the mornings primarily as his biggest complaints were after breakfast with a marked improvement in symptoms and decrease in lightheadedness.  No further falls.  Today,the patient denies chest pain, dyspnea nocturnal dyspnea, orthopnea or peripheral edema.  There have been no palpitation or syncope.    Since last visit he is taking his BP medication after breakfast  It did well the first couple of days but he noticed after he ate his BP is high  When his BP was under 100 he would take whole tablet of his Midodrine 5 mg .However when his systolic BP ranges 657-846 he notes not taking his BP medication He notes he can count how many tablets he has taken since May 22 :5 full tablets and 2-3 half  tablets  In the evening his systolic BP ranges 962-952 and he takes a Midodrine 2.5 mg tablet   Complains of Lightheadedness occurs while standing; it is relieved by sitting. Not Associated with palpitations.  Recently he had an experience in the grocery store and used his cane to sturdy him. He keeps with him in vehicle for when he have lightheaded episode. He notes he saw a Neurologist On 04/17/21--this note was reviewed and the gait assessment was  described as normal  Hydrates well playing golf, using more  Denies constipation, urinary hesitancy, dry eyes or dry mouth although the dentist has told him that he has a dry mouth  Sodium depleted and Hydration replete     Wife had MS-passed away   DATE TEST EF   August 23, 2023 Echo  60-65 %   7/21 Echo  60-65 %         Date Cr K Hgb  11/21 1.41 4.0 13.8  6/22 1.15 4.3 13.8 (5/22)    Past Medical History:  Diagnosis Date   BRADYCARDIA 06/01/2007   Cataract    bil cataracts removed   COPD (chronic obstructive pulmonary disease) ([place])    EMPHYSEMA 03/28/2008   Emphysema of lung ([place])    GERD (gastroesophageal reflux disease)    HYPERGLYCEMIA 06/01/2007   HYPERLIPIDEMIA 06/01/2007   Hypertension    HYPERTENSION 01/19/2009   Peripheral arterial disease ([place])    TIA (transient ischemic attack)       Surgical History:  Past Surgical History:  Procedure Laterality Date   ABDOMINAL AORTOGRAM W/LOWER EXTREMITY N/A 03/05/2020   Procedure: ABDOMINAL AORTOGRAM W/  LOWER EXTREMITY;  Surgeon: Lorretta Harp, MD;  Location: [facility];  Service: Cardiovascular;  Laterality: N/A;   CATARACT EXTRACTION, BILATERAL     COLONOSCOPY     ENDARTERECTOMY FEMORAL Right 04/03/2020   Procedure: Right Femoral Endarterectomy;  Surgeon: Ruta Hinds  E, MD;  Location: [facility];  Service: Vascular;  Laterality: Right;   ENDARTERECTOMY FEMORAL Left 09/10/2020   Procedure: ENDARTERECTOMY FEMORAL LEFT;  Surgeon: Elam Dutch, MD;  Location: [facility];  Service: Vascular;  Laterality: Left;   EYE SURGERY     Bilaterl cataracts   PATCH ANGIOPLASTY Left 09/10/2020   Procedure: PATCH ANGIOPLASTY OF LEFT FEMORAL ARTERY USING HEMASHIELD PLATINUM FINESSE PATCH;  Surgeon: Elam Dutch, MD;  Location: [facility];  Service: Vascular;  Laterality: Left;   POLYPECTOMY     TONSILLECTOMY AND ADENOIDECTOMY       Home Meds: No outpatient medications have been marked as taking for the 04/18/21 encounter (Office  Visit) with Deboraha Sprang, MD.    Allergies:  Allergies  Allergen Reactions   Bee Venom Anaphylaxis    Yellow jackets   Meclizine     Fatigue     Social History   Socioeconomic History   Marital status: Married    Spouse name: Not on file   Number of children: Not on file   Years of education: Not on file   Highest education level: Not on file  Occupational History   Occupation: Retired   Tobacco Use   Smoking status: Former    Packs/day: 0.50    Years: 40.00    Pack years: 20.00    Types: Cigarettes    Quit date: 09/26/2017    Years since quitting: 3.5   Smokeless tobacco: Never   Tobacco comments:    stopped 2018  Vaping Use   Vaping Use: Never used  Substance and Sexual Activity   Alcohol use: No   Drug use: No   Sexual activity: Not on file  Other Topics Concern   Not on file  Social History Narrative   Lives at home with wife   Right handed   Caffeine: drinks de-caf drinks, diet drinks if its a coke.   Social Determinants of Health   Financial Resource Strain: Not on file  Food Insecurity: Not on file  Transportation Needs: Not on file  Physical Activity: Not on file  Stress: Not on file  Social Connections: Not on file  Intimate Partner Violence: Not on file     Family History  Problem Relation Age of Onset   Cancer Mother        "Male" cancer   Uterine cancer Mother    Heart disease Father    Colon cancer Neg Hx    Esophageal cancer Neg Hx    Rectal cancer Neg Hx    Stomach cancer Neg Hx    Liver cancer Neg Hx    Pancreatic cancer Neg Hx    Prostate cancer Neg Hx    Stroke Neg Hx     Physical Exam: BP 114/69   Pulse 61   Ht 5\' 11"  (1.803 m)   Wt 208 lb (94.3 kg)   SpO2 95%   BMI 29.01 kg/m  Well developed and nourished in no acute distress HENT normal Neck supple with JVP-  flat  Lungs Clear Regular rate and rhythm, No murmurs and No gallops Abd-soft with active BS No Clubbing cyanosis No edema Skin-warm and dry A &  Oriented  Grossly normal sensory and motor function, gait mildly wide-based  ECG: sinus w RBBB (old > 10 yrs)   Assessment and Plan:  Orthostatic hypotension  Diagnosis of vertigo, vasovagal syncope, ataxia  Peripheral vascular disease with prior lower extremity revascularization  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  There is one recorded series of high blood pressures, but evenings were associated with a fall of about 30 mm with standing.  I suspect that a unifying diagnosis is the orthostatic hypotension.  Notably, with standing, his heart rate even with a blood pressure falling from 90--60 did not change much suggesting a possible autonomic failure.  However, he does not have other systemic signs to support this diagnosis at this time.  We have reviewed the physiology of orthostatic intolerance, we discussed the importance of hydration, and have decided to continue ProAmatine at a lower dose 2.5 mg 3 times a day.  He is reminded not to take it more than 4 hours before bed.  I have also suggested, rather than carrying a cane for stability get a cane chair, so that in the event that he does become symptomatic he can sit.  Oftentimes holding a cane will be insufficient  Also think about the underlying cause contributing to his new onset orthostasis.  Amyloid is less likely given his normal hemoglobin;  there are some data sets that use serum NE <220 are preferentially treated with drugs like proamatine, but his response is so positive will hold  The fact that he has a gait disorder/neuropathy raises a concern of amyloid and SPEP..UEP;  there is also an association with paraneoplastic disorders and this must  be kept in mind.          I,Stephanie Williams,acting as a Education administrator for Virl Axe, MD.,have documented all relevant documentation on the behalf of Virl Axe, MD,as directed by  Virl Axe, MD while in  the presence of Virl Axe, MD.  I, Virl Axe, MD, have reviewed all documentation for this visit. The documentation on 04/18/21 for the exam, diagnosis, procedures, and orders are all accurate and complete.

## 2021-04-23 NOTE — Progress Notes (Signed)
I agree with the above plan 

## 2021-05-14 ENCOUNTER — Other Ambulatory Visit: Payer: Self-pay | Admitting: Vascular Surgery

## 2021-05-17 DIAGNOSIS — M5136 Other intervertebral disc degeneration, lumbar region: Secondary | ICD-10-CM | POA: Diagnosis not present

## 2021-05-17 DIAGNOSIS — M47816 Spondylosis without myelopathy or radiculopathy, lumbar region: Secondary | ICD-10-CM | POA: Diagnosis not present

## 2021-05-17 DIAGNOSIS — M1612 Unilateral primary osteoarthritis, left hip: Secondary | ICD-10-CM | POA: Diagnosis not present

## 2021-07-08 DIAGNOSIS — L570 Actinic keratosis: Secondary | ICD-10-CM | POA: Diagnosis not present

## 2021-07-09 ENCOUNTER — Telehealth: Payer: Self-pay

## 2021-07-09 NOTE — Chronic Care Management (AMB) (Signed)
  Chronic Care Management   Note  07/09/2021 Name: ROLDAN LAFOREST MRN: [id number] DOB: [birth year]  EARMON SHERROW is a 86 y.o. year old male who is a primary care patient of Vivi Barrack, MD. I reached out to [name] by phone today in response to a referral sent by Mr. KAYD LAUNER PCP.  Mr. Ghan was given information about Chronic Care Management services today including:  CCM service includes personalized support from designated clinical staff supervised by his physician, including individualized plan of care and coordination with other care providers 24/7 contact phone numbers for assistance for urgent and routine care needs. Service will only be billed when office clinical staff spend 20 minutes or more in a month to coordinate care. Only one practitioner may furnish and bill the service in a calendar month. The patient may stop CCM services at any time (effective at the end of the month) by phone call to the office staff. The patient is responsible for co-pay (up to 20% after annual deductible is met) if co-pay is required by the individual health plan.   Patient agreed to services and verbal consent obtained.   Follow up plan: Telephone appointment with care management team member scheduled for:07/18/2021  Noreene Larsson, [place], [place], [place] [address] Direct Dial: [phone] [email or web address]

## 2021-07-15 ENCOUNTER — Encounter (INDEPENDENT_AMBULATORY_CARE_PROVIDER_SITE_OTHER): Payer: Medicare Other | Admitting: Ophthalmology

## 2021-07-15 ENCOUNTER — Other Ambulatory Visit: Payer: Self-pay

## 2021-07-15 DIAGNOSIS — H35033 Hypertensive retinopathy, bilateral: Secondary | ICD-10-CM

## 2021-07-15 DIAGNOSIS — H43813 Vitreous degeneration, bilateral: Secondary | ICD-10-CM | POA: Diagnosis not present

## 2021-07-15 DIAGNOSIS — H353132 Nonexudative age-related macular degeneration, bilateral, intermediate dry stage: Secondary | ICD-10-CM

## 2021-07-15 DIAGNOSIS — H35372 Puckering of macula, left eye: Secondary | ICD-10-CM

## 2021-07-15 DIAGNOSIS — I10 Essential (primary) hypertension: Secondary | ICD-10-CM | POA: Diagnosis not present

## 2021-07-18 ENCOUNTER — Telehealth: Payer: Self-pay | Admitting: *Deleted

## 2021-07-18 ENCOUNTER — Telehealth: Payer: Medicare Other

## 2021-07-18 NOTE — Telephone Encounter (Signed)
  Care Management   Follow Up Note   07/18/2021 Name: Bryan Armstrong MRN: [id number] DOB: 12-06-1934   Referred by: Vivi Barrack, MD Reason for referral : Chronic Care Management (Initial)   An unsuccessful telephone outreach was attempted today. The patient was referred to the case management team for assistance with care management and care coordination.   Follow Up Plan: RNCM will seek assistance from Care Guides in rescheduling appointment within the next 30 days.  Bryan Azure RN, MSN RN Care Management Coordinator  [place] [phone] [email or web address]

## 2021-07-19 ENCOUNTER — Telehealth: Payer: Self-pay | Admitting: *Deleted

## 2021-07-19 NOTE — Chronic Care Management (AMB) (Signed)
  Care Management   Note  07/19/2021 Name: Bryan Armstrong MRN: [id number] DOB: July 30, 1935  [name] is a 86 y.o. year old male who is a primary care patient of Vivi Barrack, MD and is actively engaged with the care management team. I reached out to [name] by phone today to assist with re-scheduling an initial visit with the RN Case Manager  Follow up plan: Unsuccessful telephone outreach attempt made. A HIPAA compliant phone message was left for the patient providing contact information and requesting a return call.  The care management team will reach out to the patient again over the next 7 days.  If patient returns call to provider office, please advise to call [place] at [phone].  [name] Management  Direct Dial: [phone]

## 2021-07-26 NOTE — Chronic Care Management (AMB) (Signed)
  Care Management   Note  07/26/2021 Name: JLON BETKER MRN: [id number] DOB: 05/14/1935  Bryan Armstrong is a 86 y.o. year old male who is a primary care patient of Vivi Barrack, MD and is actively engaged with the care management team. I reached out to [name] by phone today to assist with re-scheduling an initial visit with the RN Case Manager  Follow up plan: Telephone appointment with care management team member scheduled for:08/08/21  [facility] Management  Direct Dial: [phone]

## 2021-08-07 ENCOUNTER — Ambulatory Visit: Payer: Medicare Other | Admitting: Internal Medicine

## 2021-08-07 ENCOUNTER — Other Ambulatory Visit: Payer: Self-pay

## 2021-08-07 ENCOUNTER — Encounter: Payer: Self-pay | Admitting: Internal Medicine

## 2021-08-07 VITALS — BP 110/68 | HR 54 | Ht 71.0 in | Wt 210.0 lb

## 2021-08-07 DIAGNOSIS — R001 Bradycardia, unspecified: Secondary | ICD-10-CM

## 2021-08-07 DIAGNOSIS — I951 Orthostatic hypotension: Secondary | ICD-10-CM | POA: Diagnosis not present

## 2021-08-07 NOTE — Progress Notes (Signed)
Patient Care Team: Vivi Barrack, MD as PCP - General (Family Medicine) Sherren Mocha, MD as PCP - Cardiology (Cardiology) Amaryllis Dyke, Harvie Bridge, MD (Internal Medicine) Leona Singleton, RN as [facility] Management   HPI  Bryan Armstrong is a 86 y.o. male Seen in follow-up for orthostatic hypotension in the setting of peripheral neuropathy and gait disturbance and remote right bundle branch block.  Currently treated with ProAmatine  Blood pressures at home have been mostly above 100.  No significant dizziness.  He does note that if he misses one of his ProAmatine doses that he gets quite lightheaded.  History of peripheral vascular disease on aspirin and statins  DATE TEST EF    10/12 Echo  60-65 %    7/21 Echo  60-65 %               Date Cr K Hgb  11/21 1.41 4.0 13.8  6/22 1.15 4.3 13.8 (5/22)      Records and Results Reviewed   Past Medical History:  Diagnosis Date   BRADYCARDIA 06/01/2007   Cataract    bil cataracts removed   COPD (chronic obstructive pulmonary disease) ([place])    EMPHYSEMA 03/28/2008   Emphysema of lung ([facility])    GERD (gastroesophageal reflux disease)    HYPERGLYCEMIA 06/01/2007   HYPERLIPIDEMIA 06/01/2007   Hypertension    HYPERTENSION 01/19/2009   Peripheral arterial disease ([employer])    TIA (transient ischemic attack)     Past Surgical History:  Procedure Laterality Date   ABDOMINAL AORTOGRAM W/LOWER EXTREMITY N/A 03/05/2020   Procedure: ABDOMINAL AORTOGRAM W/  LOWER EXTREMITY;  Surgeon: Lorretta Harp, MD;  Location: [facility];  Service: Cardiovascular;  Laterality: N/A;   CATARACT EXTRACTION, BILATERAL     COLONOSCOPY     ENDARTERECTOMY FEMORAL Right 04/03/2020   Procedure: Right Femoral Endarterectomy;  Surgeon: Elam Dutch, MD;  Location: [facility];  Service: Vascular;  Laterality: Right;   ENDARTERECTOMY FEMORAL Left 09/10/2020   Procedure: ENDARTERECTOMY FEMORAL LEFT;  Surgeon: Elam Dutch, MD;   Location: [facility];  Service: Vascular;  Laterality: Left;   EYE SURGERY     Bilaterl cataracts   PATCH ANGIOPLASTY Left 09/10/2020   Procedure: PATCH ANGIOPLASTY OF LEFT FEMORAL ARTERY USING HEMASHIELD PLATINUM FINESSE PATCH;  Surgeon: Elam Dutch, MD;  Location: [facility];  Service: Vascular;  Laterality: Left;   POLYPECTOMY     TONSILLECTOMY AND ADENOIDECTOMY      Current Meds  Medication Sig   aspirin 81 MG tablet Take 81 mg by mouth daily.    EPINEPHrine (EPIPEN 2-PAK) 0.3 mg/0.3 mL IJ SOAJ injection Inject 0.3 mLs (0.3 mg total) into the muscle as needed for anaphylaxis.   fluticasone (FLONASE) 50 MCG/ACT nasal spray Place 2 sprays into both nostrils daily as needed for allergies or rhinitis.   fluticasone-salmeterol (ADVAIR) 250-50 MCG/ACT AEPB Inhale 1 puff into the lungs every 12 (twelve) hours.   HYDROcodone-acetaminophen (NORCO/VICODIN) 5-325 MG tablet Take 1 tablet by mouth every 6 (six) hours as needed for moderate pain.   midodrine (PROAMATINE) 2.5 MG tablet Take 1 tablet (2.5 mg total) by mouth 3 (three) times daily with meals. Take 1 tablet at 8am, 12 noon and 4pm   Multiple Vitamins-Minerals (PRESERVISION AREDS 2 PO) Take 1 capsule by mouth daily.   rosuvastatin (CRESTOR) 20 MG tablet TAKE 1 TABLET(20 MG) BY MOUTH DAILY AT 6 PM    Allergies  Allergen  Reactions   Bee Venom Anaphylaxis    Yellow jackets   Meclizine     Fatigue       Review of Systems negative except from HPI and PMH  Physical Exam BP 110/68   Pulse (!) 54   Ht 5\' 11"  (1.803 m)   Wt 210 lb (95.3 kg)   SpO2 98%   BMI 29.29 kg/m  Well developed and well nourished in no acute distress HENT normal E scleral and icterus clear Neck Supple JVP flat; carotids brisk and full Clear to ausculation Regular rate and rhythm, no murmurs gallops or rub Soft with active bowel sounds No clubbing cyanosis  Edema Alert and oriented, grossly normal motor and sensory function Skin Warm and Dry    CrCl  cannot be calculated (Patient's most recent lab result is older than the maximum 21 days allowed.).   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  Discussed the importance of the third dose.  Can also increase as necessary active 5 mg.  We reviewed the fact that there was no heart rate response, suggesting autonomic failure Without other systemic symptoms.  I did not after the last visit we will do blood work to exclude amyloid in this guy with orthostatic hypotension and peripheral neuropathy.  He is to see Dr. Burt Knack next month or 2 and we will ask him to do that   Current medicines are reviewed at length with the patient today .  The patient does not  have concerns regarding medicines.

## 2021-08-07 NOTE — Patient Instructions (Signed)

## 2021-08-08 ENCOUNTER — Ambulatory Visit (INDEPENDENT_AMBULATORY_CARE_PROVIDER_SITE_OTHER): Payer: Medicare Other | Admitting: *Deleted

## 2021-08-08 DIAGNOSIS — J449 Chronic obstructive pulmonary disease, unspecified: Secondary | ICD-10-CM

## 2021-08-08 DIAGNOSIS — I739 Peripheral vascular disease, unspecified: Secondary | ICD-10-CM

## 2021-08-11 NOTE — Chronic Care Management (AMB) (Signed)
  Care Management   Follow Up Note   08/11/2021 Name: Bryan Armstrong MRN: [id number] DOB: 04/20/1935   Referred by: Vivi Barrack, MD Reason for referral : Chronic Care Management ([place])   Successful contact was made with the patient to discuss care management and care coordination services. Patient declines engagement at this time.   Follow Up Plan: No further follow up required: as patient declines CCM RNCM outreaches.  Hubert Azure RN, MSN RN Care Management Coordinator  [facility] [phone] [email or web address]

## 2021-08-11 NOTE — Patient Instructions (Signed)
Visit Information  Thank you for allowing me to share the care management and care coordination services that are available to you as part of your health plan and services through your primary care provider and medical home. Please reach out to me at [phone] if the care management/care coordination team may be of assistance to you in the future.   Arzella Rehmann RN, MSN RN Care Management Coordinator  [facility] [phone] [email or web address]  

## 2021-08-12 DIAGNOSIS — J449 Chronic obstructive pulmonary disease, unspecified: Secondary | ICD-10-CM | POA: Diagnosis not present

## 2021-08-21 ENCOUNTER — Other Ambulatory Visit: Payer: Self-pay | Admitting: Vascular Surgery

## 2021-08-21 NOTE — Telephone Encounter (Signed)
Please call and make follow up appointment

## 2021-08-26 ENCOUNTER — Encounter: Payer: Self-pay | Admitting: Adult Health

## 2021-08-26 ENCOUNTER — Ambulatory Visit: Payer: Medicare Other | Admitting: Adult Health

## 2021-08-26 VITALS — BP 154/82 | HR 55 | Ht 71.0 in | Wt 208.0 lb

## 2021-08-26 DIAGNOSIS — I951 Orthostatic hypotension: Secondary | ICD-10-CM | POA: Diagnosis not present

## 2021-08-26 DIAGNOSIS — G459 Transient cerebral ischemic attack, unspecified: Secondary | ICD-10-CM

## 2021-08-26 DIAGNOSIS — R2689 Other abnormalities of gait and mobility: Secondary | ICD-10-CM

## 2021-08-26 DIAGNOSIS — G629 Polyneuropathy, unspecified: Secondary | ICD-10-CM

## 2021-08-26 NOTE — Progress Notes (Signed)
[facility] [address] [place]. [place] [address] [phone]       OFFICE FOLLOW-UP NOTE  Bryan Armstrong Date of Birth:  Feb 05, 1935 Medical Record Number:  [id number]    Chief Complaint  Patient presents with   Follow-up    Rm 3 alone Pt is well, still having some numbness in feet and imbalance/weakness in legs. Overall stable       HPI:   Update 08/26/2021 JM: Returns for 84-month stroke follow-up.  Stable from stroke standpoint without new stroke/TIA symptoms.  Compliant on aspirin and Crestor -denies side effects.  Blood pressure today 154/82.  Continues to experience bilateral toe numbness as well as imbalance (discussed at prior visit). Denies worsening since prior visit. B/l toe numbness/tingling worse at night but denies associated pain.  Has been taking B12 supplement. Continues on midodrine 2.5mg  TID which has been helping BP and preventing orthostatic hypotension. Feels like legs may be weaker since prior visit. Occasional vertigo - quick lasting.  Has been participating at [facility] routinely and playing golf.  No further concerns at this time.    History provided for reference purposes only Update 04/16/2021 JM: Bryan Armstrong returns for 84-month follow-up.  Stable from stroke standpoint without new stroke/TIA symptoms.  He did have 1 episode of vertigo back in April which subsided after performing Epley maneuver -no additional episodes since that time.  His greatest concern today is in regards to gradually worsening BLE numbness/tingling present at nighttime -denies associated pain. PCP recently checked lab work (02/19/2021) with B12 212 and started on monthly injections with last injection 5/27.  He believes he may have had some benefit of symptoms after B12 injection.  Continues to follow with cardiology for symptomatic hypotension and started on midodrine on 5/27 - he reports taking approx 6-7 pills since starting using more frequently in the first 2 weeks.  He does have follow up with Dr. Caryl Comes 7/7. BP has been more regulated where he has not been experiencing low blood pressure.  Blood pressure today 102/60.  Compliant on aspirin and Crestor without associated side effects.  No further concerns at this time.  Update 11/19/2020 JM: Bryan Armstrong returns for TIA follow-up as well as concerns of dizziness/vertigo unaccompanied.  Reports episode of vertigo 1 week ago where he felt as though he was getting pulled towards the right side and was able to lower himself to his knees as he felt as though he was going to fall.  This sensation lasted for 2 to 3 minutes and then subsided.  He laid down in his bed and took a nap for approximately 20 minutes with resolution of symptoms upon awakening.  Denies any other associated symptoms such as weakness, numbness/tingling, visual changes, speech changes, facial weakness or headaches.  He was evaluated by ENT for continued vertigo (unable to view via epic) - per patient, told left inner ear issue and referred to PT for vestibular rehab.  He has been working with neuro rehab PT with improvement of symptoms.  He is able to turn his head more without provoking symptoms and symptoms typically progress with quick head movements.  He did trial meclizine x1 with increased fatigue without noticeable benefit.  Otherwise, he has been stable from a stroke standpoint.  Reports ongoing compliance with aspirin 81 mg daily and Crestor 20 mg daily and denies side effects.  Blood pressure today 127/77.  Previously having issues with hypotension therefore PCP discontinued Maxzide and blood pressure has been slowly stabilizing.  He will occasionally have low blood pressure still where he will feel lightheaded in 1 to 2 seconds OD visual change which are typical symptoms of low blood pressure for patient.  No further concerns at this time.  Initial visit 06/19/2020 Dr. Leonie Man: Bryan Armstrong is a 86 year old Caucasian male seen today for initial office  follow-up visit following hospital consultation for TIA in July 2021.  History is obtained from the patient, review of electronic medical records and I personally reviewed available imaging films in PACS.  He has past medical history for hyperlipidemia, hypertension, peripheral arterial disease, COPD, bradycardia with history of syncope who presented to [facility] on 04/17/2020 with sudden onset of gait instability.  He felt suddenly off balance and was leaning to the right side and slammed into a wall.  He denied any accompanying vertigo, room spinning, lightheadedness blurred vision no slurred speech or double vision.  This lasted about 5 minutes and then gradually returned back to normal.  He stated he had a previous episode 10 years ago of suddenly collapsing after getting up from the dining table at a restaurant and was felt to have had a vasovagal syncope at that time.  He does have history of peripheral arterial disease and underwent right femoral endarterectomy on 04/03/2020 with Dr. Oneida Alar and left-sided endarterectomy is also planned.  MRI scan of the brain obtained in the hospital was negative for acute infarct and showed small changes of small vessel disease CT angiogram showed no significant extracranial intracranial large vessel stenosis or occlusion.  LDL cholesterol 57 mg percent and hemoglobin A1c was 5.7.  Transthoracic echo showed normal ejection fraction without cardiac source of embolism.  Outpatient cardiac Holter monitoring showed no significant cardiac arrhythmias.  Patient states he has had episodes of hypotension which are manifested with blurred vision and feeling tired and fuzzy headed.  The has noticed this happen multiple times when his blood pressure has been below 562 systolic.  He does take Maxide about 2 or 3 days a week when his blood pressure is running in the 150s.  He seems to be exquisitely sensitive to this.  He plans to discuss with his primary care physician  alternative blood pressure management strategies.  He admits to not drinking enough fluids.  He was scheduled to undergo dental surgery soon which she hopes will solve his problems.  He has had no recurrent stroke or TIA symptoms.  He remains on Crestor 20 mg he is tolerating well without muscle aches and pains.     ROS:   14 system review of systems is positive for those listed in HPI and all other systems negative  PMH:  Past Medical History:  Diagnosis Date   BRADYCARDIA 06/01/2007   Cataract    bil cataracts removed   COPD (chronic obstructive pulmonary disease) ([facility])    EMPHYSEMA 03/28/2008   Emphysema of lung (HCC)    GERD (gastroesophageal reflux disease)    HYPERGLYCEMIA 06/01/2007   HYPERLIPIDEMIA 06/01/2007   Hypertension    HYPERTENSION 01/19/2009   Peripheral arterial disease ([facility])    TIA (transient ischemic attack)     Social History:  Social History   Socioeconomic History   Marital status: Married    Spouse name: Not on file   Number of children: Not on file   Years of education: Not on file   Highest education level: Not on file  Occupational History   Occupation: Retired   Tobacco Use   Smoking status: Former  Packs/day: 0.50    Years: 40.00    Pack years: 20.00    Types: Cigarettes    Quit date: 09/26/2017    Years since quitting: 3.9   Smokeless tobacco: Never   Tobacco comments:    stopped 2018  Vaping Use   Vaping Use: Never used  Substance and Sexual Activity   Alcohol use: No   Drug use: No   Sexual activity: Not on file  Other Topics Concern   Not on file  Social History Narrative   Lives at home with wife   Right handed   Caffeine: drinks de-caf drinks, diet drinks if its a coke.   Social Determinants of Health   Financial Resource Strain: Not on file  Food Insecurity: Not on file  Transportation Needs: Not on file  Physical Activity: Not on file  Stress: Not on file  Social Connections: Not on file  Intimate Partner Violence:  Not on file    Medications:   Current Outpatient Medications on File Prior to Visit  Medication Sig Dispense Refill   aspirin 81 MG tablet Take 81 mg by mouth daily.      EPINEPHrine (EPIPEN 2-PAK) 0.3 mg/0.3 mL IJ SOAJ injection Inject 0.3 mLs (0.3 mg total) into the muscle as needed for anaphylaxis. 1 each 0   fluticasone (FLONASE) 50 MCG/ACT nasal spray Place 2 sprays into both nostrils daily as needed for allergies or rhinitis.     fluticasone-salmeterol (ADVAIR) 250-50 MCG/ACT AEPB Inhale 1 puff into the lungs every 12 (twelve) hours. 60 each 11   HYDROcodone-acetaminophen (NORCO/VICODIN) 5-325 MG tablet Take 1 tablet by mouth every 6 (six) hours as needed for moderate pain. 20 tablet 0   midodrine (PROAMATINE) 2.5 MG tablet Take 1 tablet (2.5 mg total) by mouth 3 (three) times daily with meals. Take 1 tablet at 8am, 12 noon and 4pm 270 tablet 3   Multiple Vitamins-Minerals (PRESERVISION AREDS 2 PO) Take 1 capsule by mouth daily.     rosuvastatin (CRESTOR) 20 MG tablet TAKE 1 TABLET(20 MG) BY MOUTH DAILY AT 6 PM 90 tablet 0   No current facility-administered medications on file prior to visit.    Allergies:   Allergies  Allergen Reactions   Bee Venom Anaphylaxis    Yellow jackets   Meclizine     Fatigue     Physical Exam Today's Vitals   08/26/21 1540  BP: (!) 154/82  Pulse: (!) 55  Weight: 208 lb (94.3 kg)  Height: 5\' 11"  (1.803 m)    Body mass index is 29.01 kg/m.   General: well developed, well nourished pleasant elderly Caucasian male, seated, in no evident distress Head: head normocephalic and atraumatic.  Neck: supple with no carotid or supraclavicular bruits Cardiovascular: regular rate and rhythm, no murmurs Musculoskeletal: no deformity Skin:  no rash/petichiae Vascular:  Normal pulses all extremities  Neurologic Exam Mental Status: Awake and fully alert.  Fluent speech and language.  Oriented to place and time. Recent and remote memory intact. Attention  span, concentration and fund of knowledge appropriate. Mood and affect appropriate.  Cranial Nerves: Pupils equal, briskly reactive to light. Extraocular movements full without nystagmus. Visual fields full to confrontation. Hearing slightly diminished bilaterally. Facial sensation intact. Face, tongue, palate moves normally and symmetrically.  Motor: Normal bulk and tone. Normal strength in all tested extremity muscles except mild left ADF weakness Sensory.: intact to touch ,pinprick .position and vibratory sensation.  Coordination: Rapid alternating movements normal in all extremities. Finger-to-nose and heel-to-shin  performed accurately bilaterally. Gait and Station: Arises from chair without difficulty. Stance is normal. Gait demonstrates broad-based gait with slightly decreased stride length and unsteadiness without use of assistive device.  Tandem walk and heel toe not attempted Reflexes: 1+ and symmetric. Toes downgoing.       ASSESSMENT/PLAN: 86 year old male with posterior circulation TIA in July 2021 from small vessel disease.  Vascular risk factors of hypertension hyperlipidemia, peripheral arterial disease, BPPV and age. New complaint of gradual worsening bilateral toe numbness/tingling and gait impairment/imbalance    TIA : Continue aspirin 81 mg daily  and Crestor for secondary stroke prevention.  Discussed secondary stroke prevention measures and importance of close PCP f/u for aggressive stroke risk factor management  Vertigo: stable after completion of vestibular rehab.  Discussed use of Epley maneuver at home but he started to experience recurrence of symptoms that he is not able to manage, may consider additional sessions of vestibular rehab Neuropathy: present bilateral toes -no evidence of sensory loss. Likely associated with imbalance/gait impairment. Will proceed with EMG/NCV for further evaluation. Currently denies painful symptoms.  Hypotension w/ hx of HTN: Continue  midodrine 5 mg daily for symptomatic hypotension.  Followed by cardiology HLD: LDL goal <70. On Crestor 20 mg daily per PCP    Follow-up in 4-5 months with Dr. Leonie Man (for further eval of neuropathy) or call earlier if needed    CC:  Vivi Barrack, MD   I spent 33 minutes of face-to-face and non-face-to-face time with patient.  This included previsit chart review, lab review, study review, order entry, electronic health record documentation, patient education and discussion regarding hx of TIA and secondary stroke prevention measures and importance of aggressive stroke risk factor management, hx of BPPV, and concerns of worsening neuropathy with possible etiologies and further treatment options as well as further evaluation and answered all other questions to patients satisfaction  Frann Rider, AGNP-BC  [facility] [address] [place], [place] [address]  Phone [phone] Fax [phone] Note: This document was prepared with digital dictation and possible smart phrase technology. Any transcriptional errors that result from this process are unintentional.

## 2021-08-26 NOTE — Patient Instructions (Addendum)
Your Plan:  Please ensure you schedule testing for EMG/NCV to further evaluate for neuropathy  Continue to follow with cardiology regarding low blood pressure issues  Continue to follow with PCP Dr. Jerline Pain for stroke risk factor management     Follow up in 4-5 months with Dr. Leonie Man or call earlier if needed    Thank you for coming to see [place] at [facility]. I hope we have been able to provide you high quality care today.  You may receive a patient satisfaction survey over the next few weeks. We would appreciate your feedback and comments so that we may continue to improve ourselves and the health of our patients.

## 2021-10-03 DIAGNOSIS — E538 Deficiency of other specified B group vitamins: Secondary | ICD-10-CM | POA: Insufficient documentation

## 2021-10-24 ENCOUNTER — Encounter: Payer: Medicare Other | Admitting: Diagnostic Neuroimaging

## 2021-10-24 ENCOUNTER — Ambulatory Visit (INDEPENDENT_AMBULATORY_CARE_PROVIDER_SITE_OTHER): Payer: Medicare Other | Admitting: Diagnostic Neuroimaging

## 2021-10-24 ENCOUNTER — Other Ambulatory Visit: Payer: Self-pay

## 2021-10-24 DIAGNOSIS — I951 Orthostatic hypotension: Secondary | ICD-10-CM

## 2021-10-24 DIAGNOSIS — G629 Polyneuropathy, unspecified: Secondary | ICD-10-CM

## 2021-10-24 DIAGNOSIS — Z0289 Encounter for other administrative examinations: Secondary | ICD-10-CM

## 2021-10-24 DIAGNOSIS — R2689 Other abnormalities of gait and mobility: Secondary | ICD-10-CM

## 2021-10-24 NOTE — Procedures (Signed)
[facility]  NCS (NERVE CONDUCTION STUDY) WITH EMG (ELECTROMYOGRAPHY) REPORT   STUDY DATE: 10/24/21 PATIENT NAME: Bryan Armstrong DOB: Jan 25, 1935 MRN: [id number]  ORDERING CLINICIAN: Frann Rider, NP   TECHNOLOGIST: Sherre Scarlet ELECTROMYOGRAPHER: Earlean Polka. Lovena Kluck, MD  CLINICAL INFORMATION: 87 year old male with bilateral foot numbness and history of B12 deficiency.  Denies low back pain.  FINDINGS: NERVE CONDUCTION STUDY:  Temperature: 36.0 degrees C  Bilateral peroneal and tibial motor responses are normal.  Bilateral tibial F wave latencies are slightly prolonged.  Bilateral sural and superficial peroneal sensory responses could not be obtained.   NEEDLE ELECTROMYOGRAPHY:  Needle examination of left lower extremity is notable for decreased recruitment of large motor units in left tibialis anterior on exertion.  No abnormal spontaneous activity.   IMPRESSION:   Abnormal study demonstrating: -Absent sensory responses in the lower extremities, consistent with severe sensory neuropathy. -Chronic denervation of the left tibialis anterior may be related to remote radiculopathy or neuropathy.   INTERPRETING PHYSICIAN:  Penni Bombard, MD Certified in Neurology, Neurophysiology and Neuroimaging  [facility] [address] [place], [place] [address] [phone]  [facility]    Nerve / Sites Muscle Latency Ref. Amplitude Ref. Rel Amp Segments Distance Velocity Ref. Area    ms ms mV mV %  cm m/s m/s mVms  L Peroneal - EDB     Ankle EDB 4.7 ?6.5 5.3 ?2.0 100 Ankle - EDB 9   16.2     Fib head EDB 11.3  4.6  86.7 Fib head - Ankle 29 44 ?44 14.6     Pop fossa EDB 13.5  4.5  97.9 Pop fossa - Fib head 10 44 ?44 14.3         Pop fossa - Ankle      R Peroneal - EDB     Ankle EDB 4.6 ?6.5 4.6 ?2.0 100 Ankle - EDB 9   17.1     Fib head EDB 11.2  4.2  91.9 Fib head - Ankle 29 44 ?44 16.9     Pop fossa EDB 13.5  3.8  90.7 Pop fossa  - Fib head 10 44 ?44 16.0         Pop fossa - Ankle      L Tibial - AH     Ankle AH 4.5 ?5.8 6.2 ?4.0 100 Ankle - AH 9   16.7     Pop fossa AH 14.3  4.2  68.2 Pop fossa - Ankle 40 41 ?41 17.1  R Tibial - AH     Ankle AH 4.5 ?5.8 7.1 ?4.0 100 Ankle - AH 9   17.1     Pop fossa AH 14.1  6.3  88 Pop fossa - Ankle 40 41 ?41 16.9             SNC    Nerve / Sites Rec. Site Peak Lat Ref.  Amp Ref. Segments Distance    ms ms V V  cm  L Sural - Ankle (Calf)     Calf Ankle NR ?4.4 NR ?6 Calf - Ankle 14  R Sural - Ankle (Calf)     Calf Ankle NR ?4.4 NR ?6 Calf - Ankle 14  L Superficial peroneal - Ankle     Lat leg Ankle NR ?4.4 NR ?6 Lat leg - Ankle 14  R Superficial peroneal - Ankle     Lat leg Ankle NR ?4.4 NR ?6 Lat leg - Ankle  81             F  Wave    Nerve F Lat Ref.   ms ms  L Tibial - AH 60.8 ?56.0  R Tibial - AH 60.7 ?56.0         EMG Summary Table    Spontaneous MUAP Recruitment  Muscle IA Fib PSW Fasc Other Amp Dur. Poly Pattern  L. Vastus medialis Normal None None None _______ Normal Normal Normal Normal  L. Tibialis anterior Normal None None None _______ Increased Normal Normal Reduced  L. Gastrocnemius (Medial head) Normal None None None _______ Normal Normal Normal Normal

## 2021-10-28 ENCOUNTER — Telehealth: Payer: Self-pay

## 2021-10-28 NOTE — Telephone Encounter (Signed)
Contacted patient back as requested for additional follow up questions. Main concern is the "severe" level of neuropathy and if he needs to be seen sooner with Dr. Leonie Man for emergent evaluation. I do not believe these findings warrant an emergent evaluation as symptoms have been stable since prior visit.  Discussed potential benefit with PT for c/o leg weakness and gait impairment at prior visit but he would like to hold off. Remains on B12 supplement for B12 deficiency. He questions possible progression of symptoms - advised difficult to answer at this time as unknown etiology but plans on further discussing with Dr. Leonie Man  He also mentions  "foggy headedness" sensation over the past year and not sure he needs imaging of his head. Prior MRI brain 04/2021 without acute findings. Advised unsure if repeat imaging will be warranted as this is not new but he plans on further discussing with Dr. Leonie Man.   Patient appreciative of call and all questions answered

## 2021-10-28 NOTE — Telephone Encounter (Signed)
Contacted pt, advise patient that recent EMG/NCV did show severe peripheral neuropathy. He has f/u ith Dr. Leonie Man in March to further review and possible further evaluation. Pt was adamant to speak with Janett Billow regarding results, did not care to elaborate on what was needed.. Informed him his appt with Dr Leonie Man is the soonest he has available. Please advise

## 2021-10-28 NOTE — Telephone Encounter (Signed)
-----   Message from Frann Rider, NP sent at 10/28/2021 11:02 AM EST ----- Please advise patient that recent EMG/NCV did show severe peripheral neuropathy. He has f/u ith Dr. Leonie Man in March to further review and possible further evaluation.

## 2021-10-29 ENCOUNTER — Ambulatory Visit: Payer: Medicare Other | Admitting: Pulmonary Disease

## 2021-10-30 ENCOUNTER — Ambulatory Visit: Payer: Medicare Other | Admitting: Pulmonary Disease

## 2021-10-30 ENCOUNTER — Encounter: Payer: Self-pay | Admitting: Pulmonary Disease

## 2021-10-30 ENCOUNTER — Other Ambulatory Visit: Payer: Self-pay

## 2021-10-30 VITALS — BP 118/68 | HR 60 | Temp 97.7°F | Ht 71.0 in | Wt 213.2 lb

## 2021-10-30 DIAGNOSIS — J449 Chronic obstructive pulmonary disease, unspecified: Secondary | ICD-10-CM

## 2021-10-30 MED ORDER — FLUTICASONE-SALMETEROL 250-50 MCG/ACT IN AEPB: 1.0000 | INHALATION_SPRAY | Freq: Two times a day (BID) | RESPIRATORY_TRACT | 11 refills | Status: DC

## 2021-10-30 NOTE — Patient Instructions (Signed)
I am glad you are doing well with your breathing Continue the advair inhaler Follow-up in 1 year

## 2021-10-30 NOTE — Addendum Note (Signed)
Addended by: Elton Sin on: 10/30/2021 04:26 PM   Modules accepted: Orders

## 2021-10-30 NOTE — Progress Notes (Signed)
° °      °  Bryan Armstrong    [id number]    [birth year]  Primary Care Physician:Bryan Armstrong, Bryan Greenhouse, MD  Referring Physician: Vivi Armstrong, Bryan Armstrong,  Bryan Armstrong [id number]  Chief complaint:   Follow up for COPD GOLD A  HPI: [name] is a 87 year old with COPD GOLD A (CAT score 7, no exacerbations, FEV1 89%). He is doing well with this current inhaler therapy. He is also on albuterol when necessary. However he does not need his rescue inhaler. He does not have any recent exacerbations or ER visits.  He feels well today with dyspnea on exertion at baseline. He denies any fevers, cough, wheezing, chest pain, palpitation. He quit smoking in September 2017. He used to smoke about half pack a day for about 40 years.  Interim History: Symptoms are stable on Advair inhaler Continues to stay active with golf  Outpatient Encounter Medications as of 10/30/2021  Medication Sig   aspirin 81 MG tablet Take 81 mg by mouth daily.    cyanocobalamin 2000 MCG tablet Take 2,000 mcg by mouth daily.   EPINEPHrine (EPIPEN 2-PAK) 0.3 mg/0.3 mL IJ SOAJ injection Inject 0.3 mLs (0.3 mg total) into the muscle as needed for anaphylaxis.   fluticasone (FLONASE) 50 MCG/ACT nasal spray Place 2 sprays into both nostrils daily as needed for allergies or rhinitis.   fluticasone-salmeterol (ADVAIR) 250-50 MCG/ACT AEPB Inhale 1 puff into the lungs every 12 (twelve) hours.   midodrine (PROAMATINE) 2.5 MG tablet Take 1 tablet (2.5 mg total) by mouth 3 (three) times daily with meals. Take 1 tablet at 8am, 12 noon and 4pm   Multiple Vitamins-Minerals (PRESERVISION AREDS 2 PO) Take 1 capsule by mouth daily.   rosuvastatin (CRESTOR) 20 MG tablet TAKE 1 TABLET(20 MG) BY MOUTH DAILY AT 6 PM   No facility-administered encounter medications on file as of 10/30/2021.   Physical Exam: Blood pressure 118/68, pulse 60, temperature 97.7 F (36.5 C), temperature source Oral, height 5\' 11"  (1.803 m), weight 213 lb 3.2 oz  (96.7 kg), SpO2 95 %. Gen:      No acute distress HEENT:  EOMI, sclera anicteric Neck:     No masses; no thyromegaly Lungs:    Clear to auscultation bilaterally; normal respiratory effort CV:         Regular rate and rhythm; no murmurs Abd:      + bowel sounds; soft, non-tender; no palpable masses, no distension Ext:    No edema; adequate peripheral perfusion Skin:      Warm and dry; no rash Neuro: alert and oriented x 3 Psych: normal mood and affect   Data Reviewed: Imaging: Chest x-ray 09/29/2019-small reticulonodular opacity at the right lung base Chest x-ray 11/11/2019-no acute cardiopulmonary abnormality. Chest x-ray 02/13/2021-no acute cardiopulmonary abnormality I have reviewed the images personally.  PFTs [01/06/05] FVC 4.50 [96%], FEV1 2.85 [89], F/F 63, TLC 7.76 [111%], DLCO 132%. Mild airway obstruction, normal lung volumes, no bronchial dilator response.  Labs:  CBC 5/0/09-FGHWEXHB eosinophilic count 716  Assessment:  COPD GOLD A Stable on advair.  Blood count noted for elevated eosinophils.  He will need to be on inhaled steroid therapy Follow-up in 1 year  Plan/Recommendations: Continue Advair Follow-up in 1 year  Bryan Garfinkel MD [facility] 10/30/2021, 4:13 PM  CC: Bryan Barrack, MD

## 2021-11-12 ENCOUNTER — Telehealth: Payer: Self-pay | Admitting: Cardiovascular Disease

## 2021-11-12 NOTE — Telephone Encounter (Signed)
Attempted to contact pt.  Pt answered the phone but could not hear me for some reason.  Will have triage try pt again tomorrow.

## 2021-11-12 NOTE — Telephone Encounter (Signed)
Patient called in asking the nurse to give him a call back. His getting a pain in his calf when he works out. Thinking he might need a stint in his legs for circulations. Please advisep

## 2021-11-13 NOTE — Telephone Encounter (Signed)
Returned call to Pt.  Pt has history of PAD and has been seen by Dr. Oneida Alar.  Advised Pt to call their office to schedule an appointment to be seen.  Advised if they require another referral to please let us know.  Pt indicates understanding-will call [facility].

## 2021-12-04 ENCOUNTER — Ambulatory Visit: Payer: Medicare Other | Admitting: Neurology

## 2021-12-04 ENCOUNTER — Encounter: Payer: Self-pay | Admitting: Neurology

## 2021-12-04 VITALS — BP 138/89 | HR 60 | Ht 71.0 in | Wt 215.5 lb

## 2021-12-04 DIAGNOSIS — E538 Deficiency of other specified B group vitamins: Secondary | ICD-10-CM

## 2021-12-04 DIAGNOSIS — G629 Polyneuropathy, unspecified: Secondary | ICD-10-CM | POA: Diagnosis not present

## 2021-12-04 NOTE — Progress Notes (Signed)
[facility] [address] [place]. [place] [address] [phone]       OFFICE FOLLOW-UP NOTE  [name] Date of Birth:  1934/11/13 Medical Record Number:  [id number]    Chief Complaint  Patient presents with   Consult    Room 15 - alone. Following up from NCV/EMG. Intermittent numbness in bilateral feet. Hx of TIA events. At times, he continues to feel off balance. Feels he is weaker (gives the example of having trouble swinging a golf club). Occasional episodes of limited vision in right eye (usually only lasing 10 seconds).      HPI:   Update 08/26/2021 JM: Returns for 50-month stroke follow-up.  Stable from stroke standpoint without new stroke/TIA symptoms.  Compliant on aspirin and Crestor -denies side effects.  Blood pressure today 154/82.  Continues to experience bilateral toe numbness as well as imbalance (discussed at prior visit). Denies worsening since prior visit. B/l toe numbness/tingling worse at night but denies associated pain.  Has been taking B12 supplement. Continues on midodrine 2.5mg  TID which has been helping BP and preventing orthostatic hypotension. Feels like legs may be weaker since prior visit. Occasional vertigo - quick lasting.  Has been participating at [facility] routinely and playing golf.  No further concerns at this time.    History provided for reference purposes only Update 04/16/2021 JM: Bryan Armstrong returns for 61-month follow-up.  Stable from stroke standpoint without new stroke/TIA symptoms.  He did have 1 episode of vertigo back in April which subsided after performing Epley maneuver -no additional episodes since that time.  His greatest concern today is in regards to gradually worsening BLE numbness/tingling present at nighttime -denies associated pain. PCP recently checked lab work (02/19/2021) with B12 212 and started on monthly injections with last injection 5/27.  He believes he may have had some benefit of symptoms after B12  injection.  Continues to follow with cardiology for symptomatic hypotension and started on midodrine on 5/27 - he reports taking approx 6-7 pills since starting using more frequently in the first 2 weeks. He does have follow up with Dr. Caryl Comes 7/7. BP has been more regulated where he has not been experiencing low blood pressure.  Blood pressure today 102/60.  Compliant on aspirin and Crestor without associated side effects.  No further concerns at this time.  Update 11/19/2020 JM: Bryan Armstrong returns for TIA follow-up as well as concerns of dizziness/vertigo unaccompanied.  Reports episode of vertigo 1 week ago where he felt as though he was getting pulled towards the right side and was able to lower himself to his knees as he felt as though he was going to fall.  This sensation lasted for 2 to 3 minutes and then subsided.  He laid down in his bed and took a nap for approximately 20 minutes with resolution of symptoms upon awakening.  Denies any other associated symptoms such as weakness, numbness/tingling, visual changes, speech changes, facial weakness or headaches.  He was evaluated by ENT for continued vertigo (unable to view via epic) - per patient, told left inner ear issue and referred to PT for vestibular rehab.  He has been working with neuro rehab PT with improvement of symptoms.  He is able to turn his head more without provoking symptoms and symptoms typically progress with quick head movements.  He did trial meclizine x1 with increased fatigue without noticeable benefit.  Otherwise, he has been stable from a stroke standpoint.  Reports ongoing compliance with aspirin 81  mg daily and Crestor 20 mg daily and denies side effects.  Blood pressure today 127/77.  Previously having issues with hypotension therefore PCP discontinued Maxzide and blood pressure has been slowly stabilizing.  He will occasionally have low blood pressure still where he will feel lightheaded in 1 to 2 seconds OD visual change which are  typical symptoms of low blood pressure for patient.  No further concerns at this time.  Initial visit 06/19/2020 Dr. Leonie Man: Bryan Armstrong is a 87 year old Caucasian male seen today for initial office follow-up visit following hospital consultation for TIA in July 2021.  History is obtained from the patient, review of electronic medical records and I personally reviewed available imaging films in PACS.  He has past medical history for hyperlipidemia, hypertension, peripheral arterial disease, COPD, bradycardia with history of syncope who presented to [facility] on 04/17/2020 with sudden onset of gait instability.  He felt suddenly off balance and was leaning to the right side and slammed into a wall.  He denied any accompanying vertigo, room spinning, lightheadedness blurred vision no slurred speech or double vision.  This lasted about 5 minutes and then gradually returned back to normal.  He stated he had a previous episode 10 years ago of suddenly collapsing after getting up from the dining table at a restaurant and was felt to have had a vasovagal syncope at that time.  He does have history of peripheral arterial disease and underwent right femoral endarterectomy on 04/03/2020 with Dr. Oneida Alar and left-sided endarterectomy is also planned.  MRI scan of the brain obtained in the hospital was negative for acute infarct and showed small changes of small vessel disease CT angiogram showed no significant extracranial intracranial large vessel stenosis or occlusion.  LDL cholesterol 57 mg percent and hemoglobin A1c was 5.7.  Transthoracic echo showed normal ejection fraction without cardiac source of embolism.  Outpatient cardiac Holter monitoring showed no significant cardiac arrhythmias.  Patient states he has had episodes of hypotension which are manifested with blurred vision and feeling tired and fuzzy headed.  The has noticed this happen multiple times when his blood pressure has been below 008 systolic.  He  does take Maxide about 2 or 3 days a week when his blood pressure is running in the 150s.  He seems to be exquisitely sensitive to this.  He plans to discuss with his primary care physician alternative blood pressure management strategies.  He admits to not drinking enough fluids.  He was scheduled to undergo dental surgery soon which she hopes will solve his problems.  He has had no recurrent stroke or TIA symptoms.  He remains on Crestor 20 mg he is tolerating well without muscle aches and pains.  Update 12/04/2021 : He returns for follow-up after last visit 3 months ago.  Patient states has noticed improvement in the tingling numbness in his feet after starting vitamin B-12 replacement for a month.  His vitamin B12 was found to be low at 212.  TSH and hemoglobin A1c were normal.  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  Patient was started on B12 replacement injections and now has been switched to tablets and has noticed a distinct improvement in the numbness which used to be constant every time he rested on laydown is now on intermittent and is not as bothersome.  He also is to have some mental fogging and lack of thought clarity which also seems to be improving.  He  complains of occasional transient right altitudinal vision loss for 5 to 10 seconds of especially in his standing or sitting this may be related to his low blood pressure resume at baseline returns below.  Recently his blood pressure medications were reduced.  He has been started on midodrine for orthostatic hypotension.  He also has some difficulty with activities like playing golf for he feels he is slightly off balance when when he is completing the swing he is not sure if he will fall.   ROS:   14 system review of systems is positive for tingling numbness in the left foot only listed in HPI and all other systems negative  PMH:  Past Medical History:  Diagnosis Date   BRADYCARDIA  06/01/2007   Cataract    bil cataracts removed   COPD (chronic obstructive pulmonary disease) ([place])    EMPHYSEMA 03/28/2008   Emphysema of lung (HCC)    GERD (gastroesophageal reflux disease)    HYPERGLYCEMIA 06/01/2007   HYPERLIPIDEMIA 06/01/2007   Hypertension    HYPERTENSION 01/19/2009   Peripheral arterial disease (HCC)    TIA (transient ischemic attack)     Social History:  Social History   Socioeconomic History   Marital status: Married    Spouse name: Not on file   Number of children: Not on file   Years of education: Not on file   Highest education level: Not on file  Occupational History   Occupation: Retired   Tobacco Use   Smoking status: Former    Packs/day: 0.50    Years: 40.00    Pack years: 20.00    Types: Cigarettes    Quit date: 09/26/2017    Years since quitting: 4.1   Smokeless tobacco: Never   Tobacco comments:    stopped 2018  Vaping Use   Vaping Use: Never used  Substance and Sexual Activity   Alcohol use: No   Drug use: No   Sexual activity: Not on file  Other Topics Concern   Not on file  Social History Narrative   Lives at home with wife   Right handed   Caffeine: drinks de-caf drinks, diet drinks if its a coke.   Social Determinants of Health   Financial Resource Strain: Not on file  Food Insecurity: Not on file  Transportation Needs: Not on file  Physical Activity: Not on file  Stress: Not on file  Social Connections: Not on file  Intimate Partner Violence: Not on file    Medications:   Current Outpatient Medications on File Prior to Visit  Medication Sig Dispense Refill   aspirin 81 MG tablet Take 81 mg by mouth daily.      cyanocobalamin 2000 MCG tablet Take 2,000 mcg by mouth daily.     EPINEPHrine (EPIPEN 2-PAK) 0.3 mg/0.3 mL IJ SOAJ injection Inject 0.3 mLs (0.3 mg total) into the muscle as needed for anaphylaxis. 1 each 0   fluticasone (FLONASE) 50 MCG/ACT nasal spray Place 2 sprays into both nostrils daily as needed for  allergies or rhinitis.     fluticasone-salmeterol (ADVAIR) 250-50 MCG/ACT AEPB Inhale 1 puff into the lungs every 12 (twelve) hours. 60 each 11   midodrine (PROAMATINE) 2.5 MG tablet Take 1 tablet (2.5 mg total) by mouth 3 (three) times daily with meals. Take 1 tablet at 8am, 12 noon and 4pm 270 tablet 3   Multiple Vitamins-Minerals (PRESERVISION AREDS 2 PO) Take 1 capsule by mouth daily.     rosuvastatin (CRESTOR) 20 MG tablet TAKE  1 TABLET(20 MG) BY MOUTH DAILY AT 6 PM 90 tablet 0   No current facility-administered medications on file prior to visit.    Allergies:   Allergies  Allergen Reactions   Bee Venom Anaphylaxis    Yellow jackets   Meclizine     Fatigue     Physical Exam Today's Vitals   12/04/21 1527  BP: 138/89  Pulse: 60  Weight: 215 lb 8 oz (97.8 kg)  Height: 5\' 11"  (1.803 m)    Body mass index is 30.06 kg/m.   General: well developed, well nourished pleasant elderly Caucasian male, seated, in no evident distress Head: head normocephalic and atraumatic.  Neck: supple with no carotid or supraclavicular bruits Cardiovascular: regular rate and rhythm, no murmurs Musculoskeletal: no deformity Skin:  no rash/petichiae Vascular:  Normal pulses all extremities  Neurologic Exam Mental Status: Awake and fully alert.  Fluent speech and language.  Oriented to place and time. Recent and remote memory intact. Attention span, concentration and fund of knowledge appropriate. Mood and affect appropriate.  Cranial Nerves: Pupils equal, briskly reactive to light. Extraocular movements full without nystagmus. Visual fields full to confrontation. Hearing slightly diminished bilaterally. Facial sensation intact. Face, tongue, palate moves normally and symmetrically.  Motor: Normal bulk and tone. Normal strength in all tested extremity muscles except mild left ADF weakness Sensory.:  Diminished sensation to touch ,pinprick and diminished vibratory sensation at the left ankle and toes  only.  Romberg sign weakly positive. Coordination: Rapid alternating movements normal in all extremities. Finger-to-nose and heel-to-shin performed accurately bilaterally. Gait and Station: Arises from chair without difficulty. Stance is normal. Gait demonstrates broad-based gait with slightly decreased stride length and unsteadiness without use of assistive device.  Tandem walk and heel toe not attempted Reflexes: 1+ and symmetric. Toes downgoing.       ASSESSMENT/PLAN: 87 year old male with posterior circulation TIA in July 2021 from small vessel disease.  Vascular risk factors of hypertension hyperlipidemia, peripheral arterial disease, BPPV and age. New complaint of gradual worsening bilateral toe numbness/tingling and gait impairment/imbalance due to the sensory neuropathy likely related to low B12 which has shown partial improvement since replacement 1 month ago    I had a long discussion with the patient regards to his recent diagnosis of sensory neuropathy and discussed findings on the EMG nerve conduction and low B12 levels likely making that the cause of his neuropathy since his symptoms seem to be improving after vitamin B12 replacement for the last 1 month.  Continue vitamin B12 and the current dosage and check neuropathy panel labs.  Continue aspirin 81 mg daily for stroke prevention and maintain aggressive risk factor modification with strict control of hypertension with blood pressure goal below 130/90, lipids with LDL cholesterol goal below 70 mg percent and diabetes with hemoglobin A1c goal below 6.5%.  He will return for follow-up in the future in 3 months with Janett Billow my nurse practitioner call earlier if necessary.  I spent 30 minutes of face-to-face and non-face-to-face time with patient.  This included previsit chart review, lab review, study review, order entry, electronic health record documentation, patient education and discussion regarding hx of TIA and secondary stroke  prevention measures and importance of aggressive stroke risk factor management, hx of BPPV, and concerns of worsening neuropathy with possible etiologies and further treatment options as well as further evaluation and answered all other questions to patients satisfaction  Antony Contras, [facility] [address] [place], [place] [address]  Phone [phone] Fax [phone] Note: This document was prepared with digital dictation and possible smart phrase technology. Any transcriptional errors that result from this process are unintentional.

## 2021-12-04 NOTE — Patient Instructions (Signed)
I had a long discussion with the patient regards to his recent diagnosis of sensory neuropathy and discussed findings on the EMG nerve conduction and low B12 levels likely making that the cause of his neuropathy since his symptoms seem to be improving after vitamin B12 replacement for the last 1 month.  Continue vitamin B12 and the current dosage and check neuropathy panel labs.  Continue aspirin 81 mg daily for stroke prevention and maintain aggressive risk factor modification with strict control of hypertension with blood pressure goal below 130/90, lipids with LDL cholesterol goal below 70 mg percent and diabetes with hemoglobin A1c goal below 6.5%.  He will return for follow-up in the future in 3 months with Janett Billow my nurse practitioner call earlier if necessary. Peripheral Neuropathy Peripheral neuropathy is a type of nerve damage. It affects nerves that carry signals between the spinal cord and the arms, legs, and the rest of the body (peripheral nerves). It does not affect nerves in the spinal cord or brain. In peripheral neuropathy, one nerve or a group of nerves may be damaged. Peripheral neuropathy is a broad category that includes many specific nerve disorders, like diabetic neuropathy, hereditary neuropathy, and carpal tunnel syndrome. What are the causes? This condition may be caused by: Diabetes. This is the most common cause of peripheral neuropathy. Nerve injury. Pressure or stress on a nerve that lasts a long time. Lack (deficiency) of B vitamins. This can result from alcoholism, poor diet, or a restricted diet. Infections. Autoimmune diseases, such as rheumatoid arthritis and systemic lupus erythematosus. Nerve diseases that are passed from parent to child (inherited). Some medicines, such as cancer medicines (chemotherapy). Poisonous (toxic) substances, such as lead and mercury. Too little blood flowing to the legs. Kidney disease. Thyroid disease. In some cases, the cause of  this condition is not known. What are the signs or symptoms? Symptoms of this condition depend on which of your nerves is damaged. Common symptoms include: Loss of feeling (numbness) in the feet, hands, or both. Tingling in the feet, hands, or both. Burning pain. Very sensitive skin. Weakness. Not being able to move a part of the body (paralysis). Muscle twitching. Clumsiness or poor coordination. Loss of balance. Not being able to control your bladder. Feeling dizzy. Sexual problems. How is this diagnosed? Diagnosing and finding the cause of peripheral neuropathy can be difficult. Your health care provider will take your medical history and do a physical exam. A neurological exam will also be done. This involves checking things that are affected by your brain, spinal cord, and nerves (nervous system). For example, your health care provider will check your reflexes, how you move, and what you can feel. You may have other tests, such as: Blood tests. Electromyogram (EMG) and nerve conduction tests. These tests check nerve function and how well the nerves are controlling the muscles. Imaging tests, such as CT scans or MRI to rule out other causes of your symptoms. Removing a small piece of nerve to be examined in a lab (nerve biopsy). Removing and examining a small amount of the fluid that surrounds the brain and spinal cord (lumbar puncture). How is this treated? Treatment for this condition may involve: Treating the underlying cause of the neuropathy, such as diabetes, kidney disease, or vitamin deficiencies. Stopping medicines that can cause neuropathy, such as chemotherapy. Medicine to help relieve pain. Medicines may include: Prescription or over-the-counter pain medicine. Antiseizure medicine. Antidepressants. Pain-relieving patches that are applied to painful areas of skin. Surgery to relieve pressure  on a nerve or to destroy a nerve that is causing pain. Physical therapy to  help improve movement and balance. Devices to help you move around (assistive devices). Follow these instructions at home: Medicines Take over-the-counter and prescription medicines only as told by your health care provider. Do not take any other medicines without first asking your health care provider. Do not drive or use heavy machinery while taking prescription pain medicine. Lifestyle  Do not use any products that contain nicotine or tobacco, such as cigarettes and e-cigarettes. Smoking keeps blood from reaching damaged nerves. If you need help quitting, ask your health care provider. Avoid or limit alcohol. Too much alcohol can cause a vitamin B deficiency, and vitamin B is needed for healthy nerves. Eat a healthy diet. This includes: Eating foods that are high in fiber, such as fresh fruits and vegetables, whole grains, and beans. Limiting foods that are high in fat and processed sugars, such as fried or sweet foods. General instructions  If you have diabetes, work closely with your health care provider to keep your blood sugar under control. If you have numbness in your feet: Check every day for signs of injury or infection. Watch for redness, warmth, and swelling. Wear padded socks and comfortable shoes. These help protect your feet. Develop a good support system. Living with peripheral neuropathy can be stressful. Consider talking with a mental health specialist or joining a support group. Use assistive devices and attend physical therapy as told by your health care provider. This may include using a walker or a cane. Keep all follow-up visits as told by your health care provider. This is important. Contact a health care provider if: You have new signs or symptoms of peripheral neuropathy. You are struggling emotionally from dealing with peripheral neuropathy. Your pain is not well-controlled. Get help right away if: You have an injury or infection that is not healing  normally. You develop new weakness in an arm or leg. You have fallen or do so frequently. Summary Peripheral neuropathy is when the nerves in the arms, or legs are damaged, resulting in numbness, weakness, or pain. There are many causes of peripheral neuropathy, including diabetes, pinched nerves, vitamin deficiencies, autoimmune disease, and hereditary conditions. Diagnosing and finding the cause of peripheral neuropathy can be difficult. Your health care provider will take your medical history, do a physical exam, and do tests, including blood tests and nerve function tests. Treatment involves treating the underlying cause of the neuropathy and taking medicines to help control pain. Physical therapy and assistive devices may also help. This information is not intended to replace advice given to you by your health care provider. Make sure you discuss any questions you have with your health care provider. Document Revised: 07/10/2020 Document Reviewed: 07/10/2020 Elsevier Patient Education  2022 [employer].

## 2021-12-09 ENCOUNTER — Other Ambulatory Visit (INDEPENDENT_AMBULATORY_CARE_PROVIDER_SITE_OTHER): Payer: Self-pay

## 2021-12-09 ENCOUNTER — Encounter (INDEPENDENT_AMBULATORY_CARE_PROVIDER_SITE_OTHER): Payer: Medicare Other | Admitting: Ophthalmology

## 2021-12-09 ENCOUNTER — Other Ambulatory Visit: Payer: Self-pay

## 2021-12-09 DIAGNOSIS — H35033 Hypertensive retinopathy, bilateral: Secondary | ICD-10-CM

## 2021-12-09 DIAGNOSIS — I10 Essential (primary) hypertension: Secondary | ICD-10-CM | POA: Diagnosis not present

## 2021-12-09 DIAGNOSIS — H353132 Nonexudative age-related macular degeneration, bilateral, intermediate dry stage: Secondary | ICD-10-CM

## 2021-12-09 DIAGNOSIS — E538 Deficiency of other specified B group vitamins: Secondary | ICD-10-CM | POA: Diagnosis not present

## 2021-12-09 DIAGNOSIS — Z0289 Encounter for other administrative examinations: Secondary | ICD-10-CM

## 2021-12-09 DIAGNOSIS — R760 Raised antibody titer: Secondary | ICD-10-CM | POA: Diagnosis not present

## 2021-12-09 DIAGNOSIS — H43813 Vitreous degeneration, bilateral: Secondary | ICD-10-CM | POA: Diagnosis not present

## 2021-12-09 DIAGNOSIS — R700 Elevated erythrocyte sedimentation rate: Secondary | ICD-10-CM | POA: Diagnosis not present

## 2021-12-09 DIAGNOSIS — R799 Abnormal finding of blood chemistry, unspecified: Secondary | ICD-10-CM | POA: Diagnosis not present

## 2021-12-09 DIAGNOSIS — G629 Polyneuropathy, unspecified: Secondary | ICD-10-CM | POA: Diagnosis not present

## 2021-12-09 DIAGNOSIS — R7989 Other specified abnormal findings of blood chemistry: Secondary | ICD-10-CM | POA: Diagnosis not present

## 2021-12-10 ENCOUNTER — Telehealth: Payer: Self-pay | Admitting: Internal Medicine

## 2021-12-10 ENCOUNTER — Telehealth: Payer: Self-pay | Admitting: *Deleted

## 2021-12-10 NOTE — Telephone Encounter (Addendum)
Per Dr Olin Pia last note 08/07/2021:  Continue ProAmatine at the current dose 2.5 mg 3 times a day.  Discussed the importance of the third dose.  Can also increase as necessary active 5 mg.   Spoke with Dr Caryl Comes and he is agreeable to pt increasing ProAmatine to 5mg  - 1 tablet by mouth tid at 8am, noon and 4pm.  New Rx sent to pharmacy on file due to dose change.

## 2021-12-10 NOTE — Telephone Encounter (Signed)
° °  Pt c/o medication issue:  1. Name of Medication:   midodrine (PROAMATINE) 2.5 MG tablet    2. How are you currently taking this medication (dosage and times per day)? Take 1 tablet (2.5 mg total) by mouth 3 (three) times daily with meals. Take 1 tablet at 8am, 12 noon and 4pm  3. Are you having a reaction (difficulty breathing--STAT)?   4. What is your medication issue? Pt is calling and requesting to change his prescription from 2.5 mg to 5 mg. He said he doesn't need refill right now but he needs to be change just in case he needed refill in the next 2 weeks

## 2021-12-10 NOTE — Telephone Encounter (Signed)
-----   Message from Garvin Fila, MD sent at 12/10/2021  8:35 AM EST ----- Kindly inform the patient that vitamin B12 levels have now been replaced and fine.  Vitamin D, TSH and most of the labs which are back are all right but not all are back yet

## 2021-12-10 NOTE — Telephone Encounter (Signed)
I spoke to the patient and provided him with the available lab results.

## 2021-12-10 NOTE — Progress Notes (Signed)
Kindly inform the patient that vitamin B12 levels have now been replaced and fine.  Vitamin D, TSH and most of the labs which are back are all right but not all are back yet

## 2021-12-11 ENCOUNTER — Other Ambulatory Visit: Payer: Self-pay | Admitting: Neurology

## 2021-12-11 ENCOUNTER — Telehealth: Payer: Self-pay | Admitting: Family Medicine

## 2021-12-11 ENCOUNTER — Other Ambulatory Visit: Payer: Self-pay | Admitting: Vascular Surgery

## 2021-12-11 NOTE — Telephone Encounter (Signed)
Advised pt needs to contact Dr Donzetta Matters at [facility] for refills as he is who prescribed this medication.   ?

## 2021-12-11 NOTE — Telephone Encounter (Signed)
.. ?  Encourage patient to contact the pharmacy for refills or they can request refills through [facility] ? ?LAST APPOINTMENT DATE:  02/19/21 ? ?NEXT APPOINTMENT DATE: 02/24/22 ? ?MEDICATION:rosuvastatin (CRESTOR) 20 MG tablet ? ?Is the patient out of medication? Has 4 left ? ?PHARMACY: ?[facility] [place], [place] AT [address] Phone:  [phone]  ?Fax:  [phone]  ?  ? ? ?Let patient know to contact pharmacy at the end of the day to make sure medication is ready. ? ?Please notify patient to allow 48-72 hours to process  ?

## 2021-12-12 LAB — NEUROPATHY PANEL
A/G Ratio: 1.2 (ref 0.7–1.7)
Albumin ELP: 3.9 g/dL (ref 2.9–4.4)
Alpha 1: 0.2 g/dL (ref 0.0–0.4)
Alpha 2: 0.9 g/dL (ref 0.4–1.0)
Angio Convert Enzyme: 39 U/L (ref 14–82)
Anti Nuclear Antibody (ANA): NEGATIVE
Beta: 1 g/dL (ref 0.7–1.3)
Gamma Globulin: 1.2 g/dL (ref 0.4–1.8)
Globulin, Total: 3.3 g/dL (ref 2.2–3.9)
M-Spike, %: 0.4 g/dL — ABNORMAL HIGH
Rheumatoid fact SerPl-aCnc: 20.8 IU/mL — ABNORMAL HIGH (ref <14.0)
Sed Rate: 2 mm/hr (ref 0–30)
TSH: 3.51 u[IU]/mL (ref 0.450–4.500)
Total Protein: 7.2 g/dL (ref 6.0–8.5)
Vit D, 25-Hydroxy: 35.3 ng/mL (ref 30.0–100.0)
Vitamin B-12: >2000 pg/mL — ABNORMAL HIGH (ref 232–1245)

## 2021-12-12 MED ORDER — MIDODRINE HCL 5 MG PO TABS: 5.0000 mg | ORAL_TABLET | Freq: Three times a day (TID) | ORAL | 1 refills | Status: DC

## 2021-12-18 ENCOUNTER — Other Ambulatory Visit: Payer: Self-pay | Admitting: Neurology

## 2021-12-18 DIAGNOSIS — D892 Hypergammaglobulinemia, unspecified: Secondary | ICD-10-CM

## 2021-12-18 NOTE — Progress Notes (Signed)
I returned the patient's phone call and spoke to him today.  I explained to him that his lab work suggests he has an abnormal M protein spike this may be nonspecific but needs further evaluation and I will refer him to hematology for the same.  He also has elevated rheumatoid factor but he denies any clinical symptoms suggestive of rheumatoid arthritis and she will not pursue this further at this time.  He was advised to keep his scheduled follow-up appointment with my nurse practitioner Janett Billow in June.  He voiced understanding ?

## 2021-12-20 ENCOUNTER — Telehealth: Payer: Self-pay | Admitting: Nurse Practitioner

## 2021-12-20 ENCOUNTER — Other Ambulatory Visit: Payer: Self-pay | Admitting: *Deleted

## 2021-12-20 ENCOUNTER — Other Ambulatory Visit: Payer: Self-pay | Admitting: Nurse Practitioner

## 2021-12-20 DIAGNOSIS — D472 Monoclonal gammopathy: Secondary | ICD-10-CM

## 2021-12-20 NOTE — Progress Notes (Signed)
New pt appt with Hematology for 4/18 with labs 1 week prior to appt. Lab orders entered ?

## 2021-12-20 NOTE — Telephone Encounter (Signed)
Attempted to contact patient to schedule initial visit from referral. No answer so left voicemail for patient to call back. ?

## 2022-01-01 ENCOUNTER — Ambulatory Visit: Payer: Medicare Other | Admitting: Neurology

## 2022-01-07 ENCOUNTER — Other Ambulatory Visit: Payer: Self-pay

## 2022-01-07 ENCOUNTER — Ambulatory Visit: Payer: Medicare Other | Admitting: Cardiovascular Disease

## 2022-01-07 ENCOUNTER — Encounter: Payer: Self-pay | Admitting: Cardiovascular Disease

## 2022-01-07 VITALS — BP 128/70 | HR 65 | Ht 71.0 in | Wt 213.0 lb

## 2022-01-07 DIAGNOSIS — I10 Essential (primary) hypertension: Secondary | ICD-10-CM

## 2022-01-07 DIAGNOSIS — I951 Orthostatic hypotension: Secondary | ICD-10-CM | POA: Diagnosis not present

## 2022-01-07 DIAGNOSIS — E782 Mixed hyperlipidemia: Secondary | ICD-10-CM | POA: Diagnosis not present

## 2022-01-07 DIAGNOSIS — I739 Peripheral vascular disease, unspecified: Secondary | ICD-10-CM | POA: Diagnosis not present

## 2022-01-07 NOTE — Patient Instructions (Signed)
Medication Instructions:  ?Your physician recommends that you continue on your current medications as directed. Please refer to the Current Medication list given to you today. ? ?*If you need a refill on your cardiac medications before your next appointment, please call your pharmacy* ? ? ?Lab Work: ?NONE ?If you have labs (blood work) drawn today and your tests are completely normal, you will receive your results only by: ?MyChart Message (if you have MyChart) OR ?A paper copy in the mail ?If you have any lab test that is abnormal or we need to change your treatment, we will call you to review the results. ? ? ?Testing/Procedures: ?NONE ? ? ?Follow-Up: ?At [facility], you and your health needs are our priority.  As part of our continuing mission to provide you with exceptional heart care, we have created designated Provider Care Teams.  These Care Teams include your primary Cardiologist (physician) and Advanced Practice Providers (APPs -  Physician Assistants and Nurse Practitioners) who all work together to provide you with the care you need, when you need it. ? ? ?Your next appointment:   ?1 year(s) ? ?The format for your next appointment:   ?In Person ? ?Provider:   ?Sherren Mocha, MD   ? ?  ?

## 2022-01-07 NOTE — Progress Notes (Signed)
Cardiology Office Note:    Date:  01/07/2022   ID:  Bryan Armstrong, DOB [birth year], MRN [id number]  PCP:  Ardith Dark, MD   [facility] Providers Cardiologist:  Tonny Bollman, MD     Referring MD: Ardith Dark, MD   Chief Complaint  Patient presents with   Hypertension    History of Present Illness:    Bryan Armstrong is a 87 y.o. male with a hx of hypertension, bradycardia, and orthostatic hypotension, presenting for follow-up evaluation.  The patient also has peripheral arterial disease status post femoral endarterectomy performed by Dr. Darrick Penna.  The patient has developed significant problems with orthostatic hypotension, requiring treatment over the past year.  He is now taking midodrine 3 times daily with significant improvement in symptoms.  The patient is here alone today.  He is doing pretty well and denies any symptoms of chest pain or shortness of breath.  He still having some issues with labile blood pressures, but doing much better on midodrine.  States that he does not take it 3 times every day but tries to do this on most days.  Past Medical History:  Diagnosis Date   BRADYCARDIA 06/01/2007   Cataract    bil cataracts removed   COPD (chronic obstructive pulmonary disease) (HCC)    EMPHYSEMA 03/28/2008   Emphysema of lung (HCC)    GERD (gastroesophageal reflux disease)    HYPERGLYCEMIA 06/01/2007   HYPERLIPIDEMIA 06/01/2007   Hypertension    HYPERTENSION 01/19/2009   Peripheral arterial disease (HCC)    TIA (transient ischemic attack)     Past Surgical History:  Procedure Laterality Date   ABDOMINAL AORTOGRAM W/LOWER EXTREMITY N/A 03/05/2020   Procedure: ABDOMINAL AORTOGRAM W/  LOWER EXTREMITY;  Surgeon: Runell Gess, MD;  Location: [facility] INVASIVE CV LAB;  Service: Cardiovascular;  Laterality: N/A;   CATARACT EXTRACTION, BILATERAL     COLONOSCOPY     ENDARTERECTOMY FEMORAL Right 04/03/2020   Procedure: Right Femoral Endarterectomy;  Surgeon: Sherren Kerns, MD;  Location[facility] OR;  Service: Vascular;  Laterality: Right;   ENDARTERECTOMY FEMORAL Left 09/10/2020   Procedure: ENDARTERECTOMY FEMORAL LEFT;  Surgeon: Sherren Kerns, MD;  Location[facility] OR;  Service: Vascular;  Laterality: Left;   EYE SURGERY     Bilaterl cataracts   PATCH ANGIOPLASTY Left 09/10/2020   Procedure: PATCH ANGIOPLASTY OF LEFT FEMORAL ARTERY USING HEMASHIELD PLATINUM FINESSE PATCH;  Surgeon: Sherren Kerns, MD;  Location: [facility] OR;  Service: Vascular;  Laterality: Left;   POLYPECTOMY     TONSILLECTOMY AND ADENOIDECTOMY      Current Medications: Current Meds  Medication Sig   aspirin 81 MG tablet Take 81 mg by mouth daily.    cyanocobalamin 2000 MCG tablet Take 2,000 mcg by mouth daily.   EPINEPHrine (EPIPEN 2-PAK) 0.3 mg/0.3 mL IJ SOAJ injection Inject 0.3 mLs (0.3 mg total) into the muscle as needed for anaphylaxis.   fluticasone (FLONASE) 50 MCG/ACT nasal spray Place 2 sprays into both nostrils daily as needed for allergies or rhinitis.   fluticasone-salmeterol (ADVAIR) 250-50 MCG/ACT AEPB Inhale 1 puff into the lungs every 12 (twelve) hours.   midodrine (PROAMATINE) 5 MG tablet Take 1 tablet (5 mg total) by mouth 3 (three) times daily with meals. Take 1 tablet at 8am, 12 noon and 4pm   Multiple Vitamins-Minerals (PRESERVISION AREDS 2 PO) Take 1 capsule by mouth daily.   rosuvastatin (CRESTOR) 20 MG tablet TAKE 1 TABLET(20 MG) BY MOUTH DAILY AT  6 PM     Allergies:   Bee venom and Meclizine   Social History   Socioeconomic History   Marital status: Married    Spouse name: Not on file   Number of children: Not on file   Years of education: Not on file   Highest education level: Not on file  Occupational History   Occupation: Retired   Tobacco Use   Smoking status: Former    Packs/day: 0.50    Years: 40.00    Pack years: 20.00    Types: Cigarettes    Quit date: 09/26/2017    Years since quitting: 4.2   Smokeless tobacco: Never   Tobacco  comments:    stopped 2018  Vaping Use   Vaping Use: Never used  Substance and Sexual Activity   Alcohol use: No   Drug use: No   Sexual activity: Not on file  Other Topics Concern   Not on file  Social History Narrative   Lives at home with wife   Right handed   Caffeine: drinks de-caf drinks, diet drinks if its a coke.   Social Determinants of Health   Financial Resource Strain: Not on file  Food Insecurity: Not on file  Transportation Needs: Not on file  Physical Activity: Not on file  Stress: Not on file  Social Connections: Not on file     Family History: The patient's family history includes Cancer in his mother; Heart disease in his father; Uterine cancer in his mother. There is no history of Colon cancer, Esophageal cancer, Rectal cancer, Stomach cancer, Liver cancer, Pancreatic cancer, Prostate cancer, or Stroke.  ROS:   Please see the history of present illness.    All other systems reviewed and are negative.  EKGs/Labs/Other Studies Reviewed:    The following studies were reviewed today: Stress Myoview 03/21/20: Nuclear stress EF: 59%. There was no ST segment deviation noted during stress. The study is normal. This is a low risk study. The left ventricular ejection fraction is normal (55-65%).  Echo 04/18/20: Study Result       IMPRESSIONS     1. Left ventricular ejection fraction, by estimation, is 60 to 65%. The  left ventricle has normal function. The left ventricle has no regional  wall motion abnormalities. Left ventricular diastolic parameters were  normal.   2. Right ventricular systolic function is normal. The right ventricular  size is normal.   3. Left atrial size was mildly dilated.   4. The mitral valve is normal in structure. Trivial mitral valve  regurgitation. No evidence of mitral stenosis.   5. The aortic valve is normal in structure. Aortic valve regurgitation is  not visualized. Mild aortic valve sclerosis is present, with no  evidence  of aortic valve stenosis.   6. The inferior vena cava is normal in size with greater than 50%  respiratory variability, suggesting right atrial pressure of 3 mmHg.    Monitor 03/21/21: SUMMARY: The basic rhythm is normal sinus with an average HR of 60 bpm No atrial fibrillation or flutter No high-grade heart block or pathologic pauses There are rare PVC's (<1% burden) and occasional supraventricular beats (1.8% burden) without sustained arrhythmias  EKG:  EKG is not ordered today.    Recent Labs: 02/19/2021: ALT 18; Hemoglobin 13.5; Platelets 184.0 03/13/2021: BUN 20; Creatinine, Ser 1.15; Potassium 4.3; Sodium 141 12/09/2021: TSH 3.510  Recent Lipid Panel    Component Value Date/Time   CHOL 114 02/19/2021 1452   TRIG 130.0 02/19/2021  1452   HDL 35.90 (L) 02/19/2021 1452   CHOLHDL 3 02/19/2021 1452   VLDL 26.0 02/19/2021 1452   LDLCALC 52 02/19/2021 1452     Risk Assessment/Calculations:           Physical Exam:    VS:  BP 128/70   Pulse 65   Ht 5\' 11"  (1.803 m)   Wt 213 lb (96.6 kg)   SpO2 96%   BMI 29.71 kg/m     Wt Readings from Last 3 Encounters:  01/07/22 213 lb (96.6 kg)  12/04/21 215 lb 8 oz (97.8 kg)  10/30/21 213 lb 3.2 oz (96.7 kg)     GEN:  Well nourished, well developed pleasant elderly male in no acute distress HEENT: Normal NECK: No JVD; No carotid bruits LYMPHATICS: No lymphadenopathy CARDIAC: RRR, no murmurs, rubs, gallops RESPIRATORY:  Clear to auscultation without rales, wheezing or rhonchi  ABDOMEN: Soft, non-tender, non-distended MUSCULOSKELETAL:  No edema; No deformity  SKIN: Warm and dry NEUROLOGIC:  Alert and oriented x 3 PSYCHIATRIC:  Normal affect   ASSESSMENT:    1. Orthostatic hypotension   2. Essential hypertension   3. Mixed hyperlipidemia   4. Peripheral arterial disease (HCC)    PLAN:    In order of problems listed above:  Improved with midodrine, under the care of Dr. Graciela Husbands.  Advised he can liberalize salt  as he is not having any problems with resting/supine hypertension.  He will continue to push fluids as tolerated No longer on any antihypertensive therapy Treated with rosuvastatin 20 mg daily.  LDL cholesterol 52 mg/dL. Followed by vascular surgery.  No claudication symptoms at rest.  Status post bilateral femoral endarterectomy.           Medication Adjustments/Labs and Tests Ordered: Current medicines are reviewed at length with the patient today.  Concerns regarding medicines are outlined above.  No orders of the defined types were placed in this encounter.  No orders of the defined types were placed in this encounter.   Patient Instructions  Medication Instructions:  Your physician recommends that you continue on your current medications as directed. Please refer to the Current Medication list given to you today.  *If you need a refill on your cardiac medications before your next appointment, please call your pharmacy*   Lab Work: NONE If you have labs (blood work) drawn today and your tests are completely normal, you will receive your results only by: MyChart Message (if you have MyChart) OR A paper copy in the mail If you have any lab test that is abnormal or we need to change your treatment, we will call you to review the results.   Testing/Procedures: NONE   Follow-Up: At [facility], you and your health needs are our priority.  As part of our continuing mission to provide you with exceptional heart care, we have created designated Provider Care Teams.  These Care Teams include your primary Cardiologist (physician) and Advanced Practice Providers (APPs -  Physician Assistants and Nurse Practitioners) who all work together to provide you with the care you need, when you need it.   Your next appointment:   1 year(s)  The format for your next appointment:   In Person  Provider:   Tonny Bollman, MD        Signed, Tonny Bollman, MD  01/07/2022 5:27 PM     [facility]

## 2022-01-09 ENCOUNTER — Telehealth: Payer: Self-pay | Admitting: *Deleted

## 2022-01-22 ENCOUNTER — Inpatient Hospital Stay: Payer: Medicare Other | Attending: Oncology

## 2022-01-22 DIAGNOSIS — I739 Peripheral vascular disease, unspecified: Secondary | ICD-10-CM | POA: Diagnosis not present

## 2022-01-22 DIAGNOSIS — Z87891 Personal history of nicotine dependence: Secondary | ICD-10-CM | POA: Diagnosis not present

## 2022-01-22 DIAGNOSIS — D472 Monoclonal gammopathy: Secondary | ICD-10-CM

## 2022-01-22 DIAGNOSIS — I951 Orthostatic hypotension: Secondary | ICD-10-CM | POA: Diagnosis not present

## 2022-01-22 DIAGNOSIS — D892 Hypergammaglobulinemia, unspecified: Secondary | ICD-10-CM | POA: Diagnosis not present

## 2022-01-22 DIAGNOSIS — Z79899 Other long term (current) drug therapy: Secondary | ICD-10-CM | POA: Diagnosis not present

## 2022-01-22 DIAGNOSIS — Z7982 Long term (current) use of aspirin: Secondary | ICD-10-CM | POA: Diagnosis not present

## 2022-01-22 DIAGNOSIS — E538 Deficiency of other specified B group vitamins: Secondary | ICD-10-CM | POA: Insufficient documentation

## 2022-01-22 LAB — CBC WITH DIFFERENTIAL (CANCER CENTER ONLY)
Abs Immature Granulocytes: 0.02 10*3/uL (ref 0.00–0.07)
Basophils Absolute: 0.1 10*3/uL (ref 0.0–0.1)
Basophils Relative: 1 %
Eosinophils Absolute: 0.3 10*3/uL (ref 0.0–0.5)
Eosinophils Relative: 4 %
HCT: 41.2 % (ref 39.0–52.0)
Hemoglobin: 13.3 g/dL (ref 13.0–17.0)
Immature Granulocytes: 0 %
Lymphocytes Relative: 19 %
Lymphs Abs: 1.4 10*3/uL (ref 0.7–4.0)
MCH: 29 pg (ref 26.0–34.0)
MCHC: 32.3 g/dL (ref 30.0–36.0)
MCV: 90 fL (ref 80.0–100.0)
Monocytes Absolute: 0.4 10*3/uL (ref 0.1–1.0)
Monocytes Relative: 6 %
Neutro Abs: 5.2 10*3/uL (ref 1.7–7.7)
Neutrophils Relative %: 70 %
Platelet Count: 158 10*3/uL (ref 150–400)
RBC: 4.58 MIL/uL (ref 4.22–5.81)
RDW: 13.4 % (ref 11.5–15.5)
WBC Count: 7.4 10*3/uL (ref 4.0–10.5)
nRBC: 0 % (ref 0.0–0.2)

## 2022-01-22 LAB — SAVE SMEAR(SSMR), FOR PROVIDER SLIDE REVIEW

## 2022-01-23 LAB — KAPPA/LAMBDA LIGHT CHAINS
Kappa free light chain: 31.8 mg/L — ABNORMAL HIGH (ref 3.3–19.4)
Kappa, lambda light chain ratio: 1.69 — ABNORMAL HIGH (ref 0.26–1.65)
Lambda free light chains: 18.8 mg/L (ref 5.7–26.3)

## 2022-01-23 LAB — IGG, IGA, IGM
IgA: 283 mg/dL (ref 61–437)
IgG (Immunoglobin G), Serum: 1003 mg/dL (ref 603–1613)
IgM (Immunoglobulin M), Srm: 67 mg/dL (ref 15–143)

## 2022-01-27 NOTE — Progress Notes (Signed)
New Hematology/Oncology Consult ? ? ?Requesting MD: Dr. Antony Contras ? ?[phone] ? ?   ? ?Reason for Consult: Paraproteinemia ? ?HPI: Bryan Armstrong is an 87 year old man referred for evaluation of paraproteinemia.  Due to gradual worsening of numbness/tingling in the toes a "neuropathy panel" was obtained on 12/09/2021.  B12 returned at greater than 2000; he was noted to have a 0.4 g/dL M spike; ANA negative; rheumatoid factor elevated at 20.8; ACE level normal at 39; sed rate normal at 2. ? ?Labs were obtained in our office on 01/22/2022 in anticipation of today's visit.  Hemoglobin returned at 13.3, white count 7.4, platelet count 158,000; quantitative immunoglobulins in normal range; serum free kappa light chains mildly elevated at 31.8 (3.3-19.4), lambda free light chains normal at 18.8, ratio mildly elevated 1.69. ? ?Review of labs in the EMR from 02/19/2021 show creatinine normal at 1.09, calcium normal at 8.6. ? ? ? ? ?Past Medical History:  ?Diagnosis Date  ? BRADYCARDIA 06/01/2007  ? Cataract   ? bil cataracts removed  ? COPD (chronic obstructive pulmonary disease) ([place])   ? EMPHYSEMA 03/28/2008  ? Emphysema of lung ([place])   ? GERD (gastroesophageal reflux disease)   ? HYPERGLYCEMIA 06/01/2007  ? HYPERLIPIDEMIA 06/01/2007  ? Hypertension   ? HYPERTENSION 01/19/2009  ? Peripheral arterial disease ([place])   ? TIA (transient ischemic attack)   ?: ? ? ?Past Surgical History:  ?Procedure Laterality Date  ? ABDOMINAL AORTOGRAM W/LOWER EXTREMITY N/A 03/05/2020  ? Procedure: ABDOMINAL AORTOGRAM W/  LOWER EXTREMITY;  Surgeon: Lorretta Harp, MD;  Location: [facility];  Service: Cardiovascular;  Laterality: N/A;  ? CATARACT EXTRACTION, BILATERAL    ? COLONOSCOPY    ? ENDARTERECTOMY FEMORAL Right 04/03/2020  ? Procedure: Right Femoral Endarterectomy;  Surgeon: Elam Dutch, MD;  Location: [facility];  Service: Vascular;  Laterality: Right;  ? ENDARTERECTOMY FEMORAL Left 09/10/2020  ? Procedure: ENDARTERECTOMY FEMORAL  LEFT;  Surgeon: Elam Dutch, MD;  Location: [facility];  Service: Vascular;  Laterality: Left;  ? EYE SURGERY    ? Bilaterl cataracts  ? PATCH ANGIOPLASTY Left 09/10/2020  ? Procedure: PATCH ANGIOPLASTY OF LEFT FEMORAL ARTERY USING HEMASHIELD PLATINUM FINESSE PATCH;  Surgeon: Elam Dutch, MD;  Location: [place];  Service: Vascular;  Laterality: Left;  ? POLYPECTOMY    ? TONSILLECTOMY AND ADENOIDECTOMY    ? ? ? ?Current Outpatient Medications:  ?  aspirin 81 MG tablet, Take 81 mg by mouth daily. , Disp: , Rfl:  ?  cyanocobalamin 2000 MCG tablet, Take 2,000 mcg by mouth daily., Disp: , Rfl:  ?  EPINEPHrine (EPIPEN 2-PAK) 0.3 mg/0.3 mL IJ SOAJ injection, Inject 0.3 mLs (0.3 mg total) into the muscle as needed for anaphylaxis., Disp: 1 each, Rfl: 0 ?  fluticasone (FLONASE) 50 MCG/ACT nasal spray, Place 2 sprays into both nostrils daily as needed for allergies or rhinitis., Disp: , Rfl:  ?  fluticasone-salmeterol (ADVAIR) 250-50 MCG/ACT AEPB, Inhale 1 puff into the lungs every 12 (twelve) hours., Disp: 60 each, Rfl: 11 ?  midodrine (PROAMATINE) 5 MG tablet, Take 1 tablet (5 mg total) by mouth 3 (three) times daily with meals. Take 1 tablet at 8am, 12 noon and 4pm, Disp: 270 tablet, Rfl: 1 ?  Multiple Vitamins-Minerals (PRESERVISION AREDS 2 PO), Take 1 capsule by mouth daily., Disp: , Rfl:  ?  rosuvastatin (CRESTOR) 20 MG tablet, TAKE 1 TABLET(20 MG) BY MOUTH DAILY AT 6 PM, Disp: 90 tablet, Rfl: 0: ? ? ? ?  Allergies  ?Allergen Reactions  ? Bee Venom Anaphylaxis  ?  Yellow jackets  ? Meclizine   ?  Fatigue   ? ? ?FH: Brother deceased with multiple myeloma ? ?SOCIAL HISTORY: He lives in [place].  He is widowed.  He is retired.  He previously worked as a Pharmacist, hospital, Leisure centre manager.  He quit smoking about 5 years ago.  No alcohol intake. ? ?Review of Systems: In general he feels well.  No fevers or sweats.  He has a good appetite.  No weight loss.  He denies bleeding.  No pain except the right knee.  No unusual headaches.  No  diplopia.  No dysphagia.  No nausea or vomiting.  No cough or shortness of breath.  No change in bowel habits.  No urinary symptoms.  He has mild numbness/tingling in the feet mainly at nighttime.  He noted improvement since beginning B12 replacement. ? ?Physical Exam: ? ?Blood pressure 130/78, pulse 63, temperature 98.2 ?F (36.8 ?C), temperature source Oral, resp. rate 18, height 5' 11"  (1.803 m), weight 212 lb (96.2 kg), SpO2 98 %. ? ?Lungs: Lungs clear bilaterally. ?Cardiac: Regular rate and rhythm. ?Abdomen: Abdomen soft and nontender.  No hepatosplenomegaly. ?Vascular: No leg edema.  Chronic stasis change lower leg bilaterally. ?Lymph nodes: No palpable cervical, supraclavicular, axillary or inguinal lymph nodes. ?Skin: No rash. ? ? ?LABS: ? ?Assessment and Plan:  ? ?Serum M spike 0.4 g/dL 12/09/2021 ?01/22/2022 hemoglobin 13.3, white count 7.4, platelet count 158,000; quantitative immunoglobulins in normal range; kappa free light chains 31.8 (3.3-19.4) ?B12 deficiency, on replacement ?PVD ?Orthostatic hypotension ?COPD ? ?Bryan Armstrong was referred for evaluation of a serum M spike identified on a neuropathy panel.  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  We are obtaining additional labs today to include immunofixation and a chemistry panel.  Plan at present is for routine monitoring.  He will return for lab and follow-up in approximately 8 months. ? ?Patient seen with Dr. Benay Spice. ? ?Ned Card, NP ?01/28/2022, 12:30 PM  ? ?This was a shared visit with Ned Card.  Bryan Armstrong was interviewed and examined.  He is referred for evaluation of a serum M spike noted on a serum protein electrophoresis 12/09/2021.  There is no clinical or laboratory evidence for multiple myeloma or another lymphoproliferative disorder.  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  We will obtain a serum immunofixation to confirm the presence of a monoclonal protein. ? ?We discussed  the differential diagnosis including medical conditions associated with monoclonal paraproteins. ? ?I was present greater than 50% of today's visit.  I performed medical decision making. ? ?Julieanne Manson, MD ?

## 2022-01-28 ENCOUNTER — Inpatient Hospital Stay: Payer: Medicare Other | Admitting: Nurse Practitioner

## 2022-01-28 ENCOUNTER — Encounter: Payer: Self-pay | Admitting: Nurse Practitioner

## 2022-01-28 ENCOUNTER — Inpatient Hospital Stay: Payer: Medicare Other

## 2022-01-28 VITALS — BP 130/78 | HR 63 | Temp 98.2°F | Resp 18 | Ht 71.0 in | Wt 212.0 lb

## 2022-01-28 DIAGNOSIS — D892 Hypergammaglobulinemia, unspecified: Secondary | ICD-10-CM | POA: Diagnosis not present

## 2022-01-28 DIAGNOSIS — I951 Orthostatic hypotension: Secondary | ICD-10-CM | POA: Diagnosis not present

## 2022-01-28 DIAGNOSIS — Z87891 Personal history of nicotine dependence: Secondary | ICD-10-CM | POA: Diagnosis not present

## 2022-01-28 DIAGNOSIS — D472 Monoclonal gammopathy: Secondary | ICD-10-CM

## 2022-01-28 DIAGNOSIS — Z7982 Long term (current) use of aspirin: Secondary | ICD-10-CM | POA: Diagnosis not present

## 2022-01-28 DIAGNOSIS — I739 Peripheral vascular disease, unspecified: Secondary | ICD-10-CM | POA: Diagnosis not present

## 2022-01-28 DIAGNOSIS — Z79899 Other long term (current) drug therapy: Secondary | ICD-10-CM | POA: Diagnosis not present

## 2022-01-28 DIAGNOSIS — E538 Deficiency of other specified B group vitamins: Secondary | ICD-10-CM | POA: Diagnosis not present

## 2022-01-28 LAB — CMP (CANCER CENTER ONLY)
ALT: 14 U/L (ref 0–44)
AST: 17 U/L (ref 15–41)
Albumin: 4.4 g/dL (ref 3.5–5.0)
Alkaline Phosphatase: 52 U/L (ref 38–126)
Anion gap: 7 (ref 5–15)
BUN: 22 mg/dL (ref 8–23)
CO2: 29 mmol/L (ref 22–32)
Calcium: 9.9 mg/dL (ref 8.9–10.3)
Chloride: 102 mmol/L (ref 98–111)
Creatinine: 1.13 mg/dL (ref 0.61–1.24)
GFR, Estimated: >60 mL/min (ref >60)
Glucose, Bld: 112 mg/dL — ABNORMAL HIGH (ref 70–99)
Potassium: 4.6 mmol/L (ref 3.5–5.1)
Sodium: 138 mmol/L (ref 135–145)
Total Bilirubin: 0.8 mg/dL (ref 0.3–1.2)
Total Protein: 7.2 g/dL (ref 6.5–8.1)

## 2022-01-30 ENCOUNTER — Telehealth: Payer: Self-pay | Admitting: Neurology

## 2022-01-30 DIAGNOSIS — R42 Dizziness and giddiness: Secondary | ICD-10-CM

## 2022-01-30 NOTE — Telephone Encounter (Signed)
Pt would like a call from the nurse to discuss getting therapy for vertigo. Having trouble with vertigo again ?

## 2022-01-30 NOTE — Telephone Encounter (Signed)
Spoke with pt regarding vertigo. He denies vertigo being an issue with inner ear, he believes it is caused by low blood pressure. He reports feeling "wobbly" and having to use his golf club as a cane. C/o double vision. He is requesting a new referral to vestibular rehab, which he states was helpful previously. ? ?Okay to send referral or follow up with ENT? ?

## 2022-01-31 LAB — PROTEIN ELECTROPHORESIS, SERUM, WITH REFLEX
A/G Ratio: 1.3 (ref 0.7–1.7)
Albumin ELP: 3.8 g/dL (ref 2.9–4.4)
Alpha-1-Globulin: 0.2 g/dL (ref 0.0–0.4)
Alpha-2-Globulin: 0.7 g/dL (ref 0.4–1.0)
Beta Globulin: 0.9 g/dL (ref 0.7–1.3)
Gamma Globulin: 1.1 g/dL (ref 0.4–1.8)
Globulin, Total: 2.9 g/dL (ref 2.2–3.9)
M-Spike, %: 0.4 g/dL — ABNORMAL HIGH
SPEP Interpretation: 0
Total Protein ELP: 6.7 g/dL (ref 6.0–8.5)

## 2022-01-31 LAB — IMMUNOFIXATION REFLEX, SERUM
IgA: 308 mg/dL (ref 61–437)
IgG (Immunoglobin G), Serum: 1074 mg/dL (ref 603–1613)
IgM (Immunoglobulin M), Srm: 72 mg/dL (ref 15–143)

## 2022-02-03 ENCOUNTER — Encounter: Payer: Self-pay | Admitting: Family Medicine

## 2022-02-03 ENCOUNTER — Ambulatory Visit (INDEPENDENT_AMBULATORY_CARE_PROVIDER_SITE_OTHER): Payer: Medicare Other | Admitting: Family Medicine

## 2022-02-03 VITALS — BP 136/78 | HR 58 | Temp 98.0°F | Ht 71.0 in | Wt 210.0 lb

## 2022-02-03 DIAGNOSIS — I952 Hypotension due to drugs: Secondary | ICD-10-CM

## 2022-02-03 DIAGNOSIS — E785 Hyperlipidemia, unspecified: Secondary | ICD-10-CM

## 2022-02-03 DIAGNOSIS — H8110 Benign paroxysmal vertigo, unspecified ear: Secondary | ICD-10-CM | POA: Diagnosis not present

## 2022-02-03 DIAGNOSIS — E538 Deficiency of other specified B group vitamins: Secondary | ICD-10-CM

## 2022-02-03 NOTE — Telephone Encounter (Signed)
Created referral. Left VM for pt to call back. Vestibular Rehab should be calling pt for appointment. ?

## 2022-02-03 NOTE — Telephone Encounter (Signed)
Called back to inform pt that referral has been sent. Pt verbalized appreciation for the call. ?

## 2022-02-03 NOTE — Assessment & Plan Note (Signed)
Doing well on midodrine 3 times daily per cardiology. ?

## 2022-02-03 NOTE — Assessment & Plan Note (Signed)
Follows with neurology.  He has done well with vestibular rehab in the past.  He would like to do this again.  Still has occasional mild flares.  Will place referral today. ?

## 2022-02-03 NOTE — Progress Notes (Signed)
? ?  Bryan Armstrong is a 87 y.o. male who presents today for an office visit. ? ?Assessment/Plan:  ?Chronic Problems Addressed Today: ?BPV (benign positional vertigo) ?Follows with neurology.  He has done well with vestibular rehab in the past.  He would like to do this again.  Still has occasional mild flares.  Will place referral today. ? ?Dyslipidemia ?On Crestor 20 mg daily.  We will check lipids when he comes back next months for CPE. ? ?Vitamin B 12 deficiency ?Check B12 with next blood draw.  He has done very well with B12 replacement. ? ?Hypotension ?Doing well on midodrine 3 times daily per cardiology. ? ?  ?Subjective:  ?HPI: ? ?See A/p for status of chronic conditions.   ? ?   ?  ?Objective:  ?Physical Exam: ?BP 136/78   Pulse (!) 58   Temp 98 ?F (36.7 ?C) (Temporal)   Ht '5\' 11"'$  (1.803 m)   Wt 210 lb (95.3 kg)   SpO2 96%   BMI 29.29 kg/m?   ?Gen: No acute distress, resting comfortably ?CV: Regular rate and rhythm with no murmurs appreciated ?Pulm: Normal work of breathing, clear to auscultation bilaterally with no crackles, wheezes, or rhonchi ?Neuro: Grossly normal, moves all extremities ?Psych: Normal affect and thought content ? ?   ? ?Algis Greenhouse. Jerline Pain, MD ?02/03/2022 11:38 AM  ?

## 2022-02-03 NOTE — Assessment & Plan Note (Signed)
>>  ASSESSMENT AND PLAN FOR HYPOTENSION WRITTEN ON 02/03/2022 11:38 AM BY KENNYTH, Donny Heffern M, MD  Doing well on midodrine  3 times daily per cardiology.

## 2022-02-03 NOTE — Telephone Encounter (Signed)
Pt returned phone call.  

## 2022-02-03 NOTE — Assessment & Plan Note (Signed)
Check B12 with next blood draw.  He has done very well with B12 replacement. ?

## 2022-02-03 NOTE — Assessment & Plan Note (Signed)
On Crestor 20 mg daily.  We will check lipids when he comes back next months for CPE. ?

## 2022-02-03 NOTE — Patient Instructions (Signed)
It was very nice to see you today! ? ?I will refer you to the your rehab center. ? ?I will see you back next month for your regular checkup with labs.  Come back sooner if needed. ? ?Take care, ?Dr Jerline Pain ? ?PLEASE NOTE: ? ?If you had any lab tests please let us know if you have not heard back within a few days. You may see your results on mychart before we have a chance to review them but we will give you a call once they are reviewed by [place]. If we ordered any referrals today, please let us know if you have not heard from their office within the next week.  ? ?Please try these tips to maintain a healthy lifestyle: ? ?Eat at least 3 REAL meals and 1-2 snacks per day.  Aim for no more than 5 hours between eating.  If you eat breakfast, please do so within one hour of getting up.  ? ?Each meal should contain half fruits/vegetables, one quarter protein, and one quarter carbs (no bigger than a computer mouse) ? ?Cut down on sweet beverages. This includes juice, soda, and sweet tea.  ? ?Drink at least 1 glass of water with each meal and aim for at least 8 glasses per day ? ?Exercise at least 150 minutes every week.   ?

## 2022-02-05 ENCOUNTER — Telehealth: Payer: Self-pay

## 2022-02-05 ENCOUNTER — Ambulatory Visit: Payer: Medicare Other | Attending: Family Medicine

## 2022-02-05 VITALS — BP 145/73 | HR 58

## 2022-02-05 DIAGNOSIS — R2681 Unsteadiness on feet: Secondary | ICD-10-CM | POA: Diagnosis not present

## 2022-02-05 DIAGNOSIS — M6281 Muscle weakness (generalized): Secondary | ICD-10-CM | POA: Diagnosis not present

## 2022-02-05 DIAGNOSIS — R262 Difficulty in walking, not elsewhere classified: Secondary | ICD-10-CM | POA: Insufficient documentation

## 2022-02-05 DIAGNOSIS — H8110 Benign paroxysmal vertigo, unspecified ear: Secondary | ICD-10-CM | POA: Diagnosis not present

## 2022-02-05 DIAGNOSIS — R42 Dizziness and giddiness: Secondary | ICD-10-CM | POA: Insufficient documentation

## 2022-02-05 NOTE — Telephone Encounter (Signed)
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  Follow-up as scheduled. ? ?

## 2022-02-05 NOTE — Therapy (Signed)
?OUTPATIENT PHYSICAL THERAPY VESTIBULAR EVALUATION ? ? ? ? ?Patient Name: Bryan Armstrong ?MRN: [id number] ?DOB:05-26-1935, 87 y.o., male ?Today's Date: 02/05/2022 ? ?PCP: Vivi Barrack, MD ?REFERRING PROVIDER: Vivi Barrack, MD ? ? PT End of Session - 02/05/22 1314   ? ? Visit Number 1   ? Number of Visits 13   ? Date for PT Re-Evaluation --   ? Authorization Type [employer] Medicare   ? Progress Note Due on Visit 10   ? PT Start Time 1315   ? PT Stop Time 9381   ? PT Time Calculation (min) 42 min   ? Activity Tolerance Patient tolerated treatment well   ? Behavior During Therapy [facility] for tasks assessed/performed   ? ?  ?  ? ?  ? ? ?Past Medical History:  ?Diagnosis Date  ? BRADYCARDIA 06/01/2007  ? Cataract   ? bil cataracts removed  ? COPD (chronic obstructive pulmonary disease) ([place])   ? EMPHYSEMA 03/28/2008  ? Emphysema of lung ([place])   ? GERD (gastroesophageal reflux disease)   ? HYPERGLYCEMIA 06/01/2007  ? HYPERLIPIDEMIA 06/01/2007  ? Hypertension   ? HYPERTENSION 01/19/2009  ? Peripheral arterial disease ([place])   ? TIA (transient ischemic attack)   ? ?Past Surgical History:  ?Procedure Laterality Date  ? ABDOMINAL AORTOGRAM W/LOWER EXTREMITY N/A 03/05/2020  ? Procedure: ABDOMINAL AORTOGRAM W/  LOWER EXTREMITY;  Surgeon: Lorretta Harp, MD;  Location: [place] [facility];  Service: Cardiovascular;  Laterality: N/A;  ? CATARACT EXTRACTION, BILATERAL    ? COLONOSCOPY    ? ENDARTERECTOMY FEMORAL Right 04/03/2020  ? Procedure: Right Femoral Endarterectomy;  Surgeon: Elam Dutch, MD;  Location: [place];  Service: Vascular;  Laterality: Right;  ? ENDARTERECTOMY FEMORAL Left 09/10/2020  ? Procedure: ENDARTERECTOMY FEMORAL LEFT;  Surgeon: Elam Dutch, MD;  Location: [place];  Service: Vascular;  Laterality: Left;  ? EYE SURGERY    ? Bilaterl cataracts  ? PATCH ANGIOPLASTY Left 09/10/2020  ? Procedure: PATCH ANGIOPLASTY OF LEFT FEMORAL ARTERY USING HEMASHIELD PLATINUM FINESSE PATCH;  Surgeon: Elam Dutch, MD;   Location: [place];  Service: Vascular;  Laterality: Left;  ? POLYPECTOMY    ? TONSILLECTOMY AND ADENOIDECTOMY    ? ?Patient Active Problem List  ? Diagnosis Date Noted  ? Vitamin B 12 deficiency 10/03/2021  ? Orthostatic hypotension 04/16/2021  ? Neuropathy 02/19/2021  ? Abnormal CXR 02/13/2021  ? BPV (benign positional vertigo) 02/13/2021  ? Hypotension 06/19/2020  ? TIA (transient ischemic attack) 04/18/2020  ? PAD (peripheral artery disease) ([place]) 04/03/2020  ? Neck pain 12/30/2018  ? GERD (gastroesophageal reflux disease) 09/28/2018  ? Peripheral arterial disease ([place]) 06/15/2018  ? Former smoker 06/15/2018  ? History of colonic polyps 01/07/2018  ? Allergic rhinitis 05/31/2013  ? Urticaria 11/02/2011  ? COPD, group A, by GOLD 2017 classification ([place]) 03/28/2008  ? Dyslipidemia 06/01/2007  ? Bradycardia 06/01/2007  ? HYPERGLYCEMIA 06/01/2007  ? ? ?ONSET DATE: 02/03/2022 ? ?REFERRING DIAG: H81.10 (ICD-10-CM) - Benign paroxysmal positional vertigo, unspecified laterality ? ?THERAPY DIAG:  ?Unsteadiness on feet ? ?Dizziness and giddiness ? ?Difficulty in walking, not elsewhere classified ? ?Muscle weakness (generalized) ? ?SUBJECTIVE:  ? ?SUBJECTIVE STATEMENT: ?Patient known to this clinic. 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 Also has had prior episode of BPPV in past plan of care. Patient comes in reporting still is dealing with the orthostatic blood pressure changes, and  has been placed on a medication for this. Reports this has been manageable. States that he feels swimmy headed. Reports he is having trouble playing golf after approx 9 holes, feels like his coordination is gone. Reports the balance starts to feel off, reports he has had to slowly lower himself. Patient reports that he has not had any of the spinning sensation laying back in bed.  ?Pt accompanied by: self ? ?PERTINENT HISTORY: Recent R femoral  endarterectomy, Bradycardia, COPD, Emphysema, GERD, HLD, HTN, PAD, TIA, Orthostatic Hypotension ? ?PAIN:  ?Are you having pain? No ? ?Today's Vitals  ? 02/05/22 1328  ?BP: (!) 145/73  ?Pulse: (!) 58  ? ?There is no height or weight on file to calculate BMI. ? ? ?PRECAUTIONS: Other: Orthostatics ? ?WEIGHT BEARING RESTRICTIONS No ? ?FALLS: Has patient fallen in last 6 months? No ? ?LIVING ENVIRONMENT: ?Lives with: lives with their spouse ?Lives in: House/apartment ?Stairs: yes; no difficulty with stairs  ?Has following equipment at home: None ? ?PLOF: Leisure: Golf. Patient is Retired.  ? ?PATIENT GOALS Improve Balance with Golf ? ?OBJECTIVE:  ? ?COGNITION: ?Overall cognitive status: Within functional limits for tasks assessed ?  ?SENSATION: ?Decreased sensation and mild numbness in BLE's (most notable at night) ? ? ?POSTURE: No Significant postural limitations ? ?STRENGTH: Mild weakness noted with functional mobility in BLE; no formal MMT completed in today's visit ? ?TRANSFERS: ?Assistive device utilized: None  ?Sit to stand: SBA ?Stand to sit: SBA ? ?GAIT: ?Gait pattern:  general unsteadiness, increased veering noted, decreased step length- Left, decreased stance time- Right, and wide BOS ?Distance walked: 115 ?Assistive device utilized: None ?Level of assistance: SBA ?Comments: mild unsteadiness noted with ambulation ? ?PATIENT SURVEYS:  ?Staff did not capture ? ? ?VESTIBULAR ASSESSMENT ? ?  ? SYMPTOM BEHAVIOR: ?  Subjective history: see subjective ?  Non-Vestibular symptoms: changes in vision ?  Type of dizziness: Blurred Vision, Diplopia, Imbalance (Disequilibrium), Unsteady with head/body turns, and Lightheadedness/Faint; reports more blurred vision in the R eye > L eye ?  Frequency: daily ?  Duration: minutes ?  Aggravating factors: Induced by motion: occur when walking, turning body quickly, turning head quickly, and activity in general and Worse in the morning ?  Relieving factors: rest ?  Progression of  symptoms: worse; gradually feel like they have worsened. ? ? OCULOMOTOR EXAM: ?  Ocular Alignment: normal ?  Ocular ROM: No Limitations ?  Spontaneous Nystagmus: absent ?  Gaze-Induced Nystagmus: absent ?  Smooth Pursuits: intact ?  Saccades: intact ? ? VESTIBULAR - OCULAR REFLEX:  ?  Slow VOR: Normal ?  VOR Cancellation: Normal ?  Head-Impulse Test: HIT Right: negative ?HIT Left: positive ?  Dynamic Visual Acuity: Static: 10 ?Dynamic: 7; reports things feel blurry/fuzzy after completing ?  ? POSITIONAL TESTING: Right Dix-Hallpike: none; Duration:0 ?Left Dix-Hallpike: none; Duration: 0 ?Right Roll Test: none; Duration: 0 ?Left Roll Test: none; Duration: 0 ?  ? ?MOTION SENSITIVITY: ? ?  Motion Sensitivity Quotient ? ?Intensity: 0 = none, 1 = Lightheaded, 2 = Mild, 3 = Moderate, 4 = Severe, 5 = Vomiting ? Intensity  ?1. Sitting to supine 0  ?2. Supine to L side 0  ?3. Supine to R side 0  ?4. Supine to sitting 1  ?5. L Hallpike-Dix 0  ?6. Up from L  1  ?7. R Hallpike-Dix 0  ?8. Up from R  1  ?9. Sitting, head  ?tipped to L knee 0  ?10. Head up from  L  ?knee 1  ?11. Sitting, head  ?tipped to R knee 0  ?12. Head up from R  ?knee 1  ?13. Sitting head turns x5 2  ?14.Sitting head nods x5 2  ?15. In stance, 180?  ?turn to L  1  ?16. In stance, 180?  ?turn to R 1  ?  ?OTHOSTATICS: history of orthostatic hypotension ? ?FUNCTIONAL GAIT: MCTSIB: Condition 1: Avg of 3 trials: 30 sec, Condition 2: Avg of 3 trials: 25 sec, Condition 3: Avg of 3 trials: 25 sec, and Condition 4: Avg of 3 trials: 3 sec ? ? ?PATIENT EDUCATION: ?Education details: Educated on POC/Eval Findings ?Person educated: Patient ?Education method: Explanation ?Education comprehension: verbalized understanding ? ? ?GOALS: ?Goals reviewed with patient? Yes ? ?SHORT TERM GOALS: Target date: 03/07/2022 ? ?Pt will be independent with initial HEP for improved balance and vestibular input ?Baseline: no HEP established ?Goal status: INITIAL ? ?2.  FGA TBA and LTG to be  set as applicable ?Baseline: TBA ?Goal status: INITIAL ? ?3.  Pt will be able to hold situation 4 of M-CTSIB for >/= 15 seconds ?Baseline: 3 secs ?Goal status: INITIAL ? ? ? ?LONG TERM GOALS: Target date: 6/16/202

## 2022-02-05 NOTE — Telephone Encounter (Signed)
Patient gave verbal understanding ?

## 2022-02-06 ENCOUNTER — Ambulatory Visit: Payer: Medicare Other | Admitting: Physical Therapy

## 2022-02-10 ENCOUNTER — Ambulatory Visit: Payer: Medicare Other | Attending: Family Medicine | Admitting: Physical Therapy

## 2022-02-10 DIAGNOSIS — R42 Dizziness and giddiness: Secondary | ICD-10-CM | POA: Diagnosis not present

## 2022-02-10 DIAGNOSIS — M6281 Muscle weakness (generalized): Secondary | ICD-10-CM | POA: Insufficient documentation

## 2022-02-10 DIAGNOSIS — R262 Difficulty in walking, not elsewhere classified: Secondary | ICD-10-CM | POA: Insufficient documentation

## 2022-02-10 DIAGNOSIS — R2681 Unsteadiness on feet: Secondary | ICD-10-CM | POA: Diagnosis not present

## 2022-02-10 NOTE — Therapy (Signed)
?OUTPATIENT PHYSICAL THERAPY VESTIBULAR TREATMENT NOTE ? ? ?Patient Name: Bryan Armstrong ?MRN: [id number] ?DOB:1935/10/04, 87 y.o., male ?Today's Date: 02/10/2022 ? ?PCP: Vivi Barrack, MD ?REFERRING PROVIDER: Vivi Barrack, MD ? ? PT End of Session - 02/10/22 1527   ? ? Visit Number 2   ? Number of Visits 13   ? Date for PT Re-Evaluation 03/28/22   ? Authorization Type [employer]HC Medicare   ? Progress Note Due on Visit 10   ? PT Start Time 1525   ? PT Stop Time 1194   ? PT Time Calculation (min) 42 min   ? Activity Tolerance Patient tolerated treatment well   ? Behavior During Therapy [facility] for tasks assessed/performed   ? ?  ?  ? ?  ? ? ?Past Medical History:  ?Diagnosis Date  ? BRADYCARDIA 06/01/2007  ? Cataract   ? bil cataracts removed  ? COPD (chronic obstructive pulmonary disease) ([place])   ? EMPHYSEMA 03/28/2008  ? Emphysema of lung ([place])   ? GERD (gastroesophageal reflux disease)   ? HYPERGLYCEMIA 06/01/2007  ? HYPERLIPIDEMIA 06/01/2007  ? Hypertension   ? HYPERTENSION 01/19/2009  ? Peripheral arterial disease ([place])   ? TIA (transient ischemic attack)   ? ?Past Surgical History:  ?Procedure Laterality Date  ? ABDOMINAL AORTOGRAM W/LOWER EXTREMITY N/A 03/05/2020  ? Procedure: ABDOMINAL AORTOGRAM W/  LOWER EXTREMITY;  Surgeon: Lorretta Harp, MD;  Location: [place] [facility];  Service: Cardiovascular;  Laterality: N/A;  ? CATARACT EXTRACTION, BILATERAL    ? COLONOSCOPY    ? ENDARTERECTOMY FEMORAL Right 04/03/2020  ? Procedure: Right Femoral Endarterectomy;  Surgeon: Elam Dutch, MD;  Location: [place];  Service: Vascular;  Laterality: Right;  ? ENDARTERECTOMY FEMORAL Left 09/10/2020  ? Procedure: ENDARTERECTOMY FEMORAL LEFT;  Surgeon: Elam Dutch, MD;  Location: [place];  Service: Vascular;  Laterality: Left;  ? EYE SURGERY    ? Bilaterl cataracts  ? PATCH ANGIOPLASTY Left 09/10/2020  ? Procedure: PATCH ANGIOPLASTY OF LEFT FEMORAL ARTERY USING HEMASHIELD PLATINUM FINESSE PATCH;  Surgeon: Elam Dutch, MD;   Location: [place];  Service: Vascular;  Laterality: Left;  ? POLYPECTOMY    ? TONSILLECTOMY AND ADENOIDECTOMY    ? ?Patient Active Problem List  ? Diagnosis Date Noted  ? Vitamin B 12 deficiency 10/03/2021  ? Orthostatic hypotension 04/16/2021  ? Neuropathy 02/19/2021  ? Abnormal CXR 02/13/2021  ? BPV (benign positional vertigo) 02/13/2021  ? Hypotension 06/19/2020  ? TIA (transient ischemic attack) 04/18/2020  ? PAD (peripheral artery disease) ([place]) 04/03/2020  ? Neck pain 12/30/2018  ? GERD (gastroesophageal reflux disease) 09/28/2018  ? Peripheral arterial disease ([place]) 06/15/2018  ? Former smoker 06/15/2018  ? History of colonic polyps 01/07/2018  ? Allergic rhinitis 05/31/2013  ? Urticaria 11/02/2011  ? COPD, group A, by GOLD 2017 classification ([place]) 03/28/2008  ? Dyslipidemia 06/01/2007  ? Bradycardia 06/01/2007  ? HYPERGLYCEMIA 06/01/2007  ? ? ?ONSET DATE: 02/03/2022 ? ?REFERRING DIAG: H81.10 (ICD-10-CM) - Benign paroxysmal positional vertigo, unspecified laterality ? ?THERAPY DIAG:  ?Unsteadiness on feet ? ?Dizziness and giddiness ? ?Muscle weakness (generalized) ? ?PERTINENT HISTORY: Recent R femoral endarterectomy, Bradycardia, COPD, Emphysema, GERD, HLD, HTN, PAD, TIA, Orthostatic Hypotension ? ?PRECAUTIONS: Other: Orthostatics ? ?SUBJECTIVE: Reports that his BP for orthostatics is doing better.  ? ?PAIN:  ?Are you having pain? No ? ? ? ?OBJECTIVE:  ?PT TREATMENT ? ? [facility] PT Assessment - 02/10/22 1534   ? ?  ? Functional Gait  Assessment  ? Gait assessed  Yes   ? Gait Level Surface Walks 20 ft, slow speed, abnormal gait pattern, evidence for imbalance or deviates 10-15 in outside of the 12 in walkway width. Requires more than 7 sec to ambulate 20 ft.   7.18  ? Change in Gait Speed Able to smoothly change walking speed without loss of balance or gait deviation. Deviate no more than 6 in outside of the 12 in walkway width.   ? Gait with Horizontal Head Turns Performs head turns smoothly with slight change in  gait velocity (eg, minor disruption to smooth gait path), deviates 6-10 in outside 12 in walkway width, or uses an assistive device.   ? Gait with Vertical Head Turns Performs task with slight change in gait velocity (eg, minor disruption to smooth gait path), deviates 6 - 10 in outside 12 in walkway width or uses assistive device   ? Gait and Pivot Turn Pivot turns safely within 3 sec and stops quickly with no loss of balance.   ? Step Over Obstacle Is able to step over one shoe box (4.5 in total height) without changing gait speed. No evidence of imbalance.   ? Gait with Narrow Base of Support Ambulates less than 4 steps heel to toe or cannot perform without assistance.   ? Gait with Eyes Closed Cannot walk 20 ft without assistance, severe gait deviations or imbalance, deviates greater than 15 in outside 12 in walkway width or will not attempt task.   ? Ambulating Backwards Walks 20 ft, uses assistive device, slower speed, mild gait deviations, deviates 6-10 in outside 12 in walkway width.   ? Steps Two feet to a stair, must use rail.   Performs 2 feet due to knee pain  ? Total Score 16   ? FGA comment: High fall risk   ? ?  ?  ? ?  ? ? ? ? ?VESTIBULAR TREATMENT: ? ? ?   ?Gaze Adaptation: ?x1 Viewing Horizontal: Position: standing with feet apart , Time: 30, Reps: 2, and Comment: mild sx afterwards and feeling unsteady.  ? ?and x1 Viewing Vertical:  Position: standing with feet apart , Time: 30, Reps: 2, and Comment: mild sx afterwards and feeling unsteady.  ? ?  Cues for continuous head motion. Reviewed from previous HEP from last bout of therapy >1 yr ago.  ? ? ?Reviewed/updated standing balance HEP from previous bout of therapy. Printed new handout. See [employer] for more details. ? ?Access Code: [email or web address] ?URL: [email or web address] ?Date: 02/10/2022 ?Prepared by: Chloe Gilgannon ? ?Exercises ?- Romberg Stance Eyes Closed on Foam Pad  - 1 x daily - 5 x weekly - 1 sets - 3 reps - 30 hold - with  slight space between feet ?- Romberg Stance with Head Nods on Foam Pad  - 1 x daily - 5 x weekly - 2 sets - 10 reps ?- Romberg Stance on Foam Pad with Head Rotation  - 1 x daily - 5 x weekly - 2 sets - 10 reps ?- Tandem Walking with Counter Support  - 1 x daily - 5 x weekly - 1 sets - 3 reps ?  ?PATIENT EDUCATION: ?Education details: Reviewed findings from eval, areas to work on in therapy, results of FGA, reviewed/updated pt's HEP from previous bout of therapy and provided handout.  ?Person educated: Patient ?Education method: Explanation ?Education comprehension: verbalized understanding ? ?HEP: ?Standing VOR x1 x30 seconds with feet apart, [email or web address] ?  ?  ?GOALS: ?Goals reviewed   with patient? Yes ?  ?SHORT TERM GOALS: Target date: 03/07/2022 ?  ?Pt will be independent with initial HEP for improved balance and vestibular input ?Baseline: no HEP established ?Goal status: INITIAL ?  ?2.  FGA TBA and LTG to be set as applicable ?Baseline: 16/30 on 02/10/22 ?Goal status: MET ?  ?3.  Pt will be able to hold situation 4 of M-CTSIB for >/= 15 seconds ?Baseline: 3 secs ?Goal status: INITIAL ?  ?  ?  ?LONG TERM GOALS: Target date: 03/28/2022 ?  ?Pt will be independent with final HEP for improved balance and vestibular input  ?Baseline: no HEP established ?Goal status: INITIAL ?  ?2.  Pt will improve FGA by 4 points from baseline to demonstrate improved balance and reduced fall risk ?Baseline: 16/30 ?Goal status: INITIAL ?  ?3.  Pt will improve DVA to </= 2 line difference to indicate improved VOR ?Baseline: 3 line difference ?Goal status: INITIAL ?  ?4.  Pt will report </= 1/5 for all movements on MSQ to indicate improvement in motion sensitivity and improved activity tolerance.  ?Baseline: 1-2/5 ?Goal status: INITIAL ?  ?5.  Pt will be able to hold situation 4 of M-CTSIB for >/= 20 seconds ?Baseline: 3 seconds ?Goal status: INITIAL ?  ?  ?  ?ASSESSMENT: ?  ?CLINICAL IMPRESSION: ?Performed the FGA with pt scoring a 16/30,  indicating a high fall risk. LTG updated. Remainder of session focused on reviewing/updating pt's HEP from previous POC to include tandem gait, balance with EC and head motions, and VOR x1. Pt tolerated session

## 2022-02-10 NOTE — Patient Instructions (Addendum)
Gaze Stabilization: Standing Feet Apart ? ? ? ?Feet shoulder width apart, keeping eyes on target on wall 3-4 feet away, tilt head down 15-30? and move head side to side for 30 seconds. Repeat while moving head up and down for 30 seconds. ?Do 2-3 sessions per day. ? ?Remember to keep a continuous head movement!  ? ? ?Access Code: [email or web address] ?URL: [email or web address] ?Date: 02/10/2022 ?Prepared by: Janann August ? ?Exercises ?- Romberg Stance Eyes Closed on Foam Pad  - 1 x daily - 5 x weekly - 1 sets - 3 reps - 30 hold ?- Romberg Stance with Head Nods on Foam Pad  - 1 x daily - 5 x weekly - 2 sets - 10 reps ?- Romberg Stance on Foam Pad with Head Rotation  - 1 x daily - 5 x weekly - 2 sets - 10 reps ?- Tandem Walking with Counter Support  - 1 x daily - 5 x weekly - 1 sets - 3 reps ? ? ? ? ?Copyright ? V[employer]. All rights reserved.  ? ?

## 2022-02-14 ENCOUNTER — Ambulatory Visit: Payer: Medicare Other

## 2022-02-17 ENCOUNTER — Encounter: Payer: Self-pay | Admitting: Physical Therapy

## 2022-02-17 ENCOUNTER — Ambulatory Visit: Payer: Medicare Other | Admitting: Physical Therapy

## 2022-02-17 VITALS — BP 127/70 | HR 61

## 2022-02-17 DIAGNOSIS — R2681 Unsteadiness on feet: Secondary | ICD-10-CM

## 2022-02-17 DIAGNOSIS — R42 Dizziness and giddiness: Secondary | ICD-10-CM

## 2022-02-17 DIAGNOSIS — R262 Difficulty in walking, not elsewhere classified: Secondary | ICD-10-CM | POA: Diagnosis not present

## 2022-02-17 DIAGNOSIS — M6281 Muscle weakness (generalized): Secondary | ICD-10-CM

## 2022-02-17 NOTE — Therapy (Signed)
?OUTPATIENT PHYSICAL THERAPY VESTIBULAR TREATMENT NOTE ? ? ?Patient Name: Bryan Armstrong ?MRN: [id number] ?DOB:[birth year], 87 y.o., male ?Today's Date: 02/17/2022 ? ?PCP: Vivi Barrack, MD ?REFERRING PROVIDER: Vivi Barrack, MD ? ? PT End of Session - 02/17/22 1231   ? ? Visit Number 3   ? Number of Visits 13   ? Date for PT Re-Evaluation 03/28/22   ? Authorization Type [employer] Medicare   ? Progress Note Due on Visit 10   ? PT Start Time 1233   ? Activity Tolerance Patient tolerated treatment well   ? Behavior During Therapy [facility] for tasks assessed/performed   ? ?  ?  ? ?  ? ? ? ?Past Medical History:  ?Diagnosis Date  ? BRADYCARDIA 06/01/2007  ? Cataract   ? bil cataracts removed  ? COPD (chronic obstructive pulmonary disease) ([place])   ? EMPHYSEMA 03/28/2008  ? Emphysema of lung ([place])   ? GERD (gastroesophageal reflux disease)   ? HYPERGLYCEMIA 06/01/2007  ? HYPERLIPIDEMIA 06/01/2007  ? Hypertension   ? HYPERTENSION 01/19/2009  ? Peripheral arterial disease ([place])   ? TIA (transient ischemic attack)   ? ?Past Surgical History:  ?Procedure Laterality Date  ? ABDOMINAL AORTOGRAM W/LOWER EXTREMITY N/A 03/05/2020  ? Procedure: ABDOMINAL AORTOGRAM W/  LOWER EXTREMITY;  Surgeon: Lorretta Harp, MD;  Location: [place] [facility];  Service: Cardiovascular;  Laterality: N/A;  ? CATARACT EXTRACTION, BILATERAL    ? COLONOSCOPY    ? ENDARTERECTOMY FEMORAL Right 04/03/2020  ? Procedure: Right Femoral Endarterectomy;  Surgeon: Elam Dutch, MD;  Location: [place];  Service: Vascular;  Laterality: Right;  ? ENDARTERECTOMY FEMORAL Left 09/10/2020  ? Procedure: ENDARTERECTOMY FEMORAL LEFT;  Surgeon: Elam Dutch, MD;  Location: [place];  Service: Vascular;  Laterality: Left;  ? EYE SURGERY    ? Bilaterl cataracts  ? PATCH ANGIOPLASTY Left 09/10/2020  ? Procedure: PATCH ANGIOPLASTY OF LEFT FEMORAL ARTERY USING HEMASHIELD PLATINUM FINESSE PATCH;  Surgeon: Elam Dutch, MD;  Location: [place];  Service: Vascular;  Laterality: Left;  ?  POLYPECTOMY    ? TONSILLECTOMY AND ADENOIDECTOMY    ? ?Patient Active Problem List  ? Diagnosis Date Noted  ? Vitamin B 12 deficiency 10/03/2021  ? Orthostatic hypotension 04/16/2021  ? Neuropathy 02/19/2021  ? Abnormal CXR 02/13/2021  ? BPV (benign positional vertigo) 02/13/2021  ? Hypotension 06/19/2020  ? TIA (transient ischemic attack) 04/18/2020  ? PAD (peripheral artery disease) ([place]) 04/03/2020  ? Neck pain 12/30/2018  ? GERD (gastroesophageal reflux disease) 09/28/2018  ? Peripheral arterial disease ([place]) 06/15/2018  ? Former smoker 06/15/2018  ? History of colonic polyps 01/07/2018  ? Allergic rhinitis 05/31/2013  ? Urticaria 11/02/2011  ? COPD, group A, by GOLD 2017 classification ([place]) 03/28/2008  ? Dyslipidemia 06/01/2007  ? Bradycardia 06/01/2007  ? HYPERGLYCEMIA 06/01/2007  ? ? ?ONSET DATE: 02/03/2022 ? ?REFERRING DIAG: H81.10 (ICD-10-CM) - Benign paroxysmal positional vertigo, unspecified laterality ? ?THERAPY DIAG:  ?Unsteadiness on feet ? ?Dizziness and giddiness ? ?Muscle weakness (generalized) ? ?PERTINENT HISTORY: Recent R femoral endarterectomy, Bradycardia, COPD, Emphysema, GERD, HLD, HTN, PAD, TIA, Orthostatic Hypotension ? ?PRECAUTIONS: Other: Orthostatics ? ?SUBJECTIVE: Feels like he is having some BP issues today. Have been doing the exercises at home and they are challenging.  ? ?PAIN:  ?Are you having pain? No ? ? ? ?PT TREATMENT ? ? ? ? ?VESTIBULAR TREATMENT: ? ? ?   ?Gaze Adaptation: ?x1 Viewing Horizontal: Position: standing with feet apart ,  Time: 60 seconds , Reps: 1, and Comment: pt feeling unsteady.  ? ?and x1 Viewing Vertical:  Position: standing with feet apart , Time: 60 seconds. Reps: 1, and Comment: feeling unsteady.  ? ?  Discussed progressing to 60 seconds from 30 seconds at home.  ? ?Standing Balance: ?Surface: Airex ?Position: Narrow Base of Support -slight space between feet ?Completed with: Eyes Closed; 3 x 30 seconds, intermittent taps to chair for balance.  ? ? Eyes  Closed with wider BOS; 2 x 5 reps head turns, 2 x 5 reps head nods, incr forward lean at times.  ? ?Single Leg Stance:  ? Surface: Airex ? Lower Extremity: BLE ? At bottom of staircase; alternating SLS taps to 6" step x10 reps each side then progressing to 2nd step x10 reps ? ?Tandem Stance: ? Surface: Floor ?Completed with: Eyes Open;  ? Time: x30 seconds bilat 3/4 tandem, then 2 x 30 seconds bilat full tandem with intermittent UE support  ? ? ?On rockerboard in A/P direction: x15 reps weight shifting w/ intermittent taps for balance - incr difficulty with shifting posteriorly, holding board steady EO 2 x 30 seconds. ?  ? ?  ?PATIENT EDUCATION: ?Education details: Continued to educate on vestibular/balance deficits to work on in therapy. Progressing VOR x1 to 60 seconds at home.  ?Person educated: Patient ?Education method: Explanation ?Education comprehension: verbalized understanding ? ?HEP: ?Standing VOR x1 x30 seconds with feet apart, [id number] ?  ?  ?GOALS: ?Goals reviewed with patient? Yes ?  ?SHORT TERM GOALS: Target date: 03/07/2022 ?  ?Pt will be independent with initial HEP for improved balance and vestibular input ?Baseline: no HEP established ?Goal status: INITIAL ?  ?2.  FGA TBA and LTG to be set as applicable ?Baseline: 16/30 on 02/10/22 ?Goal status: MET ?  ?3.  Pt will be able to hold situation 4 of M-CTSIB for >/= 15 seconds ?Baseline: 3 secs ?Goal status: INITIAL ?  ?  ?  ?LONG TERM GOALS: Target date: 03/28/2022 ?  ?Pt will be independent with final HEP for improved balance and vestibular input  ?Baseline: no HEP established ?Goal status: INITIAL ?  ?2.  Pt will improve FGA by 4 points from baseline to demonstrate improved balance and reduced fall risk ?Baseline: 16/30 ?Goal status: INITIAL ?  ?3.  Pt will improve DVA to </= 2 line difference to indicate improved VOR ?Baseline: 3 line difference ?Goal status: INITIAL ?  ?4.  Pt will report </= 1/5 for all movements on MSQ to indicate improvement in  motion sensitivity and improved activity tolerance.  ?Baseline: 1-2/5 ?Goal status: INITIAL ?  ?5.  Pt will be able to hold situation 4 of M-CTSIB for >/= 20 seconds ?Baseline: 3 seconds ?Goal status: INITIAL ?  ?  ?  ?ASSESSMENT: ?  ?CLINICAL IMPRESSION: ?Progressed VOR x1 in standing to 60 seconds with pt tolerating well with no reports of dizziness, just unsteadiness. Pt able to maintain continuous head motion when performing. Remainder of session focused on SLS tasks, balance on compliant surfaces, EC and head motions. Pt challenged by wide BOS, EC and performing head motions. Pt with tendency to almost lose balance anteriorly. Pt tolerated session well. Will continue per POC.  ?  ?  ?  ?OBJECTIVE IMPAIRMENTS Abnormal gait, decreased activity tolerance, decreased balance, decreased endurance, difficulty walking, decreased strength, dizziness, postural dysfunction, and pain.  ?  ?ACTIVITY LIMITATIONS cleaning, community activity, yard work, and Office manager .  ?  ?PERSONAL FACTORS Age, Time since  onset of injury/illness/exacerbation, and 3+ comorbidities: Recent R femoral endarterectomy, Bradycardia, COPD, Emphysema, GERD, HLD, HTN, PAD, TIA  are also affecting patient's functional outcome.  ?  ?  ?REHAB POTENTIAL: Good ?  ?CLINICAL DECISION MAKING: Stable/uncomplicated ?  ?EVALUATION COMPLEXITY: Low ?  ?  ?PLAN: ?PT FREQUENCY: 2x/week ?  ?PT DURATION: 6 weeks ?  ?PLANNED INTERVENTIONS: Therapeutic exercises, Therapeutic activity, Neuromuscular re-education, Balance training, Gait training, Patient/Family education, Joint manipulation, Joint mobilization, Stair training, Vestibular training, Canalith repositioning, DME instructions, Cryotherapy, Moist heat, and Manual therapy ?  ?PLAN FOR NEXT SESSION: Monitor for BPPV if symptoms return. Progress VOR x1, balance with head motions, eyes closed, narrow BOS/tandem, and SLS tasks. Tasks to simulate golf and on unlevel surfaces.  ?  ? ? ? ?Arliss Journey, PT, DPT   ?02/17/2022, 12:35 PM ? ? ?   ? ?

## 2022-02-20 ENCOUNTER — Encounter: Payer: Self-pay | Admitting: Physical Therapy

## 2022-02-20 ENCOUNTER — Ambulatory Visit: Payer: Medicare Other | Admitting: Physical Therapy

## 2022-02-20 VITALS — BP 146/71 | HR 58

## 2022-02-20 DIAGNOSIS — R42 Dizziness and giddiness: Secondary | ICD-10-CM | POA: Diagnosis not present

## 2022-02-20 DIAGNOSIS — M6281 Muscle weakness (generalized): Secondary | ICD-10-CM

## 2022-02-20 DIAGNOSIS — R2681 Unsteadiness on feet: Secondary | ICD-10-CM

## 2022-02-20 DIAGNOSIS — R262 Difficulty in walking, not elsewhere classified: Secondary | ICD-10-CM | POA: Diagnosis not present

## 2022-02-20 NOTE — Therapy (Signed)
?OUTPATIENT PHYSICAL THERAPY VESTIBULAR TREATMENT NOTE ? ? ?Patient Name: Bryan Armstrong ?MRN: [id number] ?DOB:1934/11/18, 87 y.o., male ?Today's Date: 02/20/2022 ? ?PCP: Vivi Barrack, MD ?REFERRING PROVIDER: Vivi Barrack, MD ? ? PT End of Session - 02/20/22 1019   ? ? Visit Number 4   ? Number of Visits 13   ? Date for PT Re-Evaluation 03/28/22   ? Authorization Type [employer] Medicare   ? Progress Note Due on Visit 10   ? PT Start Time 1018   ? Equipment Utilized During Treatment Gait belt   ? Activity Tolerance Patient tolerated treatment well   ? Behavior During Therapy [facility] for tasks assessed/performed   ? ?  ?  ? ?  ? ? ? ?Past Medical History:  ?Diagnosis Date  ? BRADYCARDIA 06/01/2007  ? Cataract   ? bil cataracts removed  ? COPD (chronic obstructive pulmonary disease) ([place])   ? EMPHYSEMA 03/28/2008  ? Emphysema of lung ([place])   ? GERD (gastroesophageal reflux disease)   ? HYPERGLYCEMIA 06/01/2007  ? HYPERLIPIDEMIA 06/01/2007  ? Hypertension   ? HYPERTENSION 01/19/2009  ? Peripheral arterial disease ([place])   ? TIA (transient ischemic attack)   ? ?Past Surgical History:  ?Procedure Laterality Date  ? ABDOMINAL AORTOGRAM W/LOWER EXTREMITY N/A 03/05/2020  ? Procedure: ABDOMINAL AORTOGRAM W/  LOWER EXTREMITY;  Surgeon: Lorretta Harp, MD;  Location: [place] [facility];  Service: Cardiovascular;  Laterality: N/A;  ? CATARACT EXTRACTION, BILATERAL    ? COLONOSCOPY    ? ENDARTERECTOMY FEMORAL Right 04/03/2020  ? Procedure: Right Femoral Endarterectomy;  Surgeon: Elam Dutch, MD;  Location: [place];  Service: Vascular;  Laterality: Right;  ? ENDARTERECTOMY FEMORAL Left 09/10/2020  ? Procedure: ENDARTERECTOMY FEMORAL LEFT;  Surgeon: Elam Dutch, MD;  Location: [place];  Service: Vascular;  Laterality: Left;  ? EYE SURGERY    ? Bilaterl cataracts  ? PATCH ANGIOPLASTY Left 09/10/2020  ? Procedure: PATCH ANGIOPLASTY OF LEFT FEMORAL ARTERY USING HEMASHIELD PLATINUM FINESSE PATCH;  Surgeon: Elam Dutch, MD;   Location: [place];  Service: Vascular;  Laterality: Left;  ? POLYPECTOMY    ? TONSILLECTOMY AND ADENOIDECTOMY    ? ?Patient Active Problem List  ? Diagnosis Date Noted  ? Vitamin B 12 deficiency 10/03/2021  ? Orthostatic hypotension 04/16/2021  ? Neuropathy 02/19/2021  ? Abnormal CXR 02/13/2021  ? BPV (benign positional vertigo) 02/13/2021  ? Hypotension 06/19/2020  ? TIA (transient ischemic attack) 04/18/2020  ? PAD (peripheral artery disease) ([place]) 04/03/2020  ? Neck pain 12/30/2018  ? GERD (gastroesophageal reflux disease) 09/28/2018  ? Peripheral arterial disease ([place]) 06/15/2018  ? Former smoker 06/15/2018  ? History of colonic polyps 01/07/2018  ? Allergic rhinitis 05/31/2013  ? Urticaria 11/02/2011  ? COPD, group A, by GOLD 2017 classification ([place]) 03/28/2008  ? Dyslipidemia 06/01/2007  ? Bradycardia 06/01/2007  ? HYPERGLYCEMIA 06/01/2007  ? ? ?ONSET DATE: 02/03/2022 ? ?REFERRING DIAG: H81.10 (ICD-10-CM) - Benign paroxysmal positional vertigo, unspecified laterality ? ?THERAPY DIAG:  ?Unsteadiness on feet ? ?Dizziness and giddiness ? ?Muscle weakness (generalized) ? ?PERTINENT HISTORY: Recent R femoral endarterectomy, Bradycardia, COPD, Emphysema, GERD, HLD, HTN, PAD, TIA, Orthostatic Hypotension ? ?PRECAUTIONS: Other: Orthostatics ? ?SUBJECTIVE: Feeling a little lightheaded today - reports it hangs around until early afternoon.  ? ?PAIN:  ?Are you having pain? No ? ?Vitals:  ? 02/20/22 1021  ?BP: (!) 146/71  ?Pulse: (!) 58  ? ? ? ?VESTIBULAR TREATMENT: ? ? ? ?Standing Balance: ?  Surface: Airex ?Position: Narrow Base of Support -feet together  ?Completed with: Eyes Closed; 3 x 30 seconds, intermittent taps to chair for fingertips for balance ? ? ?NMR:  ?On air ex in corner;  ?-wide BOS > more narrow BOS, clockwise and counterclockwise circles with ball 2 sets of 5 reps each direction, mild postural sway when performing with feet closer together.  ?-wide BOS trunk rotations - with cues to pivot on feet and reach  superiorly and across to tap target to mimic trunk rotation while playing golf x10 reps each side, intermittent taps for balance, esp when coming back to midline.  ? ?Next to countertop: UE support as needed for balance.  ? -slow tandem gait down and back x2 reps, cues to hold each position for 5 seconds, incr difficulty when slowing down.  ? -EC gait, forwards and then retro gait x4 reps, pt with smaller steps going backwards and tends to veer to the L.  ? ? In // bars on rockerboard in M/L direction ? -weight shifting x15 reps, with cues for 3-5 second holds in each position, reaching outside of BOS to tap cone x8 reps each side, incr difficulty with reaching towards L side. Cues to look at cone with head/eyes.  ? -EO: x10 reps head turns and x10 reps head nods, intermittent taps to bars as needed.  ? ? ? ? ? ?  ?PATIENT EDUCATION: ?Education details: Continue with HEP.  ?Person educated: Patient ?Education method: Explanation ?Education comprehension: verbalized understanding ? ?HEP: ?Standing VOR x1 x30 seconds with feet apart, [id number] ?  ?  ?GOALS: ?Goals reviewed with patient? Yes ?  ?SHORT TERM GOALS: Target date: 03/07/2022 ?  ?Pt will be independent with initial HEP for improved balance and vestibular input ?Baseline: no HEP established ?Goal status: INITIAL ?  ?2.  FGA TBA and LTG to be set as applicable ?Baseline: 16/30 on 02/10/22 ?Goal status: MET ?  ?3.  Pt will be able to hold situation 4 of M-CTSIB for >/= 15 seconds ?Baseline: 3 secs ?Goal status: INITIAL ?  ?  ?  ?LONG TERM GOALS: Target date: 03/28/2022 ?  ?Pt will be independent with final HEP for improved balance and vestibular input  ?Baseline: no HEP established ?Goal status: INITIAL ?  ?2.  Pt will improve FGA by 4 points from baseline to demonstrate improved balance and reduced fall risk ?Baseline: 16/30 ?Goal status: INITIAL ?  ?3.  Pt will improve DVA to </= 2 line difference to indicate improved VOR ?Baseline: 3 line difference ?Goal status:  INITIAL ?  ?4.  Pt will report </= 1/5 for all movements on MSQ to indicate improvement in motion sensitivity and improved activity tolerance.  ?Baseline: 1-2/5 ?Goal status: INITIAL ?  ?5.  Pt will be able to hold situation 4 of M-CTSIB for >/= 20 seconds ?Baseline: 3 seconds ?Goal status: INITIAL ?  ?  ?  ?ASSESSMENT: ?  ?CLINICAL IMPRESSION: ?Today's skilled session continued to focus on balance with narrow BOS, eyes closed, head motions, and on compliant surfaces. Pt initially feeling more lightheaded at start of session but BP WNL. Pt reporting feeling better at end of session w/ no reports of lightheadedness at end of session. Pt tolerated session well. Will continue per POC.  ?  ?  ?  ?OBJECTIVE IMPAIRMENTS Abnormal gait, decreased activity tolerance, decreased balance, decreased endurance, difficulty walking, decreased strength, dizziness, postural dysfunction, and pain.  ?  ?ACTIVITY LIMITATIONS cleaning, community activity, yard work, and Office manager .  ?  ?  PERSONAL FACTORS Age, Time since onset of injury/illness/exacerbation, and 3+ comorbidities: Recent R femoral endarterectomy, Bradycardia, COPD, Emphysema, GERD, HLD, HTN, PAD, TIA  are also affecting patient's functional outcome.  ?  ?  ?REHAB POTENTIAL: Good ?  ?CLINICAL DECISION MAKING: Stable/uncomplicated ?  ?EVALUATION COMPLEXITY: Low ?  ?  ?PLAN: ?PT FREQUENCY: 2x/week ?  ?PT DURATION: 6 weeks ?  ?PLANNED INTERVENTIONS: Therapeutic exercises, Therapeutic activity, Neuromuscular re-education, Balance training, Gait training, Patient/Family education, Joint manipulation, Joint mobilization, Stair training, Vestibular training, Canalith repositioning, DME instructions, Cryotherapy, Moist heat, and Manual therapy ?  ?PLAN FOR NEXT SESSION: Monitor for BPPV if symptoms return. Progress VOR x1, balance with head motions, eyes closed, narrow BOS/tandem, and SLS tasks. Tasks to simulate golf and on unlevel surfaces.  ?  ? ? ? ?Arliss Journey, PT, DPT   ?02/20/2022, 10:19 AM ? ? ?   ? ?

## 2022-02-24 ENCOUNTER — Ambulatory Visit (INDEPENDENT_AMBULATORY_CARE_PROVIDER_SITE_OTHER): Payer: Medicare Other | Admitting: Family Medicine

## 2022-02-24 ENCOUNTER — Ambulatory Visit: Payer: Medicare Other | Admitting: Physical Therapy

## 2022-02-24 ENCOUNTER — Encounter: Payer: Self-pay | Admitting: Physical Therapy

## 2022-02-24 VITALS — BP 129/74 | HR 59 | Temp 98.1°F | Ht 71.0 in | Wt 208.8 lb

## 2022-02-24 VITALS — BP 137/70 | HR 62

## 2022-02-24 DIAGNOSIS — Z0001 Encounter for general adult medical examination with abnormal findings: Secondary | ICD-10-CM

## 2022-02-24 DIAGNOSIS — I951 Orthostatic hypotension: Secondary | ICD-10-CM

## 2022-02-24 DIAGNOSIS — M6281 Muscle weakness (generalized): Secondary | ICD-10-CM | POA: Diagnosis not present

## 2022-02-24 DIAGNOSIS — M1990 Unspecified osteoarthritis, unspecified site: Secondary | ICD-10-CM | POA: Diagnosis not present

## 2022-02-24 DIAGNOSIS — H8110 Benign paroxysmal vertigo, unspecified ear: Secondary | ICD-10-CM

## 2022-02-24 DIAGNOSIS — I952 Hypotension due to drugs: Secondary | ICD-10-CM

## 2022-02-24 DIAGNOSIS — E785 Hyperlipidemia, unspecified: Secondary | ICD-10-CM

## 2022-02-24 DIAGNOSIS — E538 Deficiency of other specified B group vitamins: Secondary | ICD-10-CM

## 2022-02-24 DIAGNOSIS — R262 Difficulty in walking, not elsewhere classified: Secondary | ICD-10-CM | POA: Diagnosis not present

## 2022-02-24 DIAGNOSIS — R739 Hyperglycemia, unspecified: Secondary | ICD-10-CM

## 2022-02-24 DIAGNOSIS — R2681 Unsteadiness on feet: Secondary | ICD-10-CM

## 2022-02-24 DIAGNOSIS — R42 Dizziness and giddiness: Secondary | ICD-10-CM

## 2022-02-24 LAB — CBC
HCT: 40.9 % (ref 39.0–52.0)
Hemoglobin: 13.8 g/dL (ref 13.0–17.0)
MCHC: 33.8 g/dL (ref 30.0–36.0)
MCV: 88.3 fl (ref 78.0–100.0)
Platelets: 192 10*3/uL (ref 150.0–400.0)
RBC: 4.63 Mil/uL (ref 4.22–5.81)
RDW: 13.7 % (ref 11.5–15.5)
WBC: 8.4 10*3/uL (ref 4.0–10.5)

## 2022-02-24 LAB — TSH: TSH: 2.5 u[IU]/mL (ref 0.35–5.50)

## 2022-02-24 LAB — VITAMIN B12: Vitamin B-12: 1503 pg/mL — ABNORMAL HIGH (ref 211–911)

## 2022-02-24 MED ORDER — CELECOXIB 200 MG PO CAPS: 200.0000 mg | ORAL_CAPSULE | Freq: Two times a day (BID) | ORAL | 5 refills | Status: DC | PRN

## 2022-02-24 NOTE — Assessment & Plan Note (Signed)
>>  ASSESSMENT AND PLAN FOR HYPOTENSION WRITTEN ON 02/24/2022  2:15 PM BY Sully Manzi M, MD  Continue midodrine  5 mg 3 times daily per cardiology.

## 2022-02-24 NOTE — Progress Notes (Signed)
? ?Chief Complaint:  ?Bryan Armstrong is a 87 y.o. male who presents today for his annual comprehensive physical exam.   ? ?Assessment/Plan:  ?Chronic Problems Addressed Today: ?Dyslipidemia ?On crestor '20mg'$  daily. Tolerating well. Check lipids day.  ? ?Osteoarthritis ?Follows with orthopedics.  Occasionally gets cortisone injections done.  He would like to have medication to use as needed for flares.  We will start Celebrex 200 mg twice daily.  He will use sparingly as needed to stay active.  We discussed potential side effects.  He will follow-up with orthopedic soon. ? ?Vitamin B 12 deficiency ?Check B12.  He is on 2000 mcg daily. ? ?BPV (benign positional vertigo) ?Doing well.  No recurrences.  He will continue working vestibular rehab. ? ?Hypotension ?Continue midodrine 5 mg 3 times daily per cardiology. ? ?Preventative Healthcare: ?Up-to-date on vaccines and cancer screening.  We will check labs today. ? ?Patient Counseling(The following topics were reviewed and/or handout was given): ? -Nutrition: Stressed importance of moderation in sodium/caffeine intake, saturated fat and cholesterol, caloric balance, sufficient intake of fresh fruits, vegetables, and fiber. ? -Stressed the importance of regular exercise.  ? -Substance Abuse: Discussed cessation/primary prevention of tobacco, alcohol, or other drug use; driving or other dangerous activities under the influence; availability of treatment for abuse.  ? -Injury prevention: Discussed safety belts, safety helmets, smoke detector, smoking near bedding or upholstery.  ? -Sexuality: Discussed sexually transmitted diseases, partner selection, use of condoms, avoidance of unintended pregnancy and contraceptive alternatives.  ? -Dental health: Discussed importance of regular tooth brushing, flossing, and dental visits. ? -Health maintenance and immunizations reviewed. Please refer to Health maintenance section. ? ?Return to care in 1 year for next preventative  visit.  ? ?  ?Subjective:  ?HPI: ? ?He has no acute complaints today. See A/p for status of chronic conditions.  ? ?Lifestyle ?Diet: Balanced. Plenty of fruits and vegetables.  ?Exercise: Goes 4 times per week.  ? ? ?  02/03/2022  ? 11:13 AM  ?Depression screen PHQ 2/9  ?Decreased Interest 0  ?Down, Depressed, Hopeless 0  ?PHQ - 2 Score 0  ? ? ?Health Maintenance Due  ?Topic Date Due  ? URINE MICROALBUMIN  Never done  ?  ? ?ROS: Per HPI, otherwise a complete review of systems was negative.  ? ?PMH: ? ?The following were reviewed and entered/updated in epic: ?Past Medical History:  ?Diagnosis Date  ? BRADYCARDIA 06/01/2007  ? Cataract   ? bil cataracts removed  ? COPD (chronic obstructive pulmonary disease) ([place])   ? EMPHYSEMA 03/28/2008  ? Emphysema of lung ([place])   ? GERD (gastroesophageal reflux disease)   ? HYPERGLYCEMIA 06/01/2007  ? HYPERLIPIDEMIA 06/01/2007  ? Hypertension   ? HYPERTENSION 01/19/2009  ? Peripheral arterial disease ([place])   ? TIA (transient ischemic attack)   ? ?Patient Active Problem List  ? Diagnosis Date Noted  ? Osteoarthritis 02/24/2022  ? Vitamin B 12 deficiency 10/03/2021  ? Neuropathy 02/19/2021  ? Abnormal CXR 02/13/2021  ? BPV (benign positional vertigo) 02/13/2021  ? Hypotension 06/19/2020  ? TIA (transient ischemic attack) 04/18/2020  ? PAD (peripheral artery disease) ([place]) 04/03/2020  ? Neck pain 12/30/2018  ? GERD (gastroesophageal reflux disease) 09/28/2018  ? Peripheral arterial disease ([place]) 06/15/2018  ? Former smoker 06/15/2018  ? History of colonic polyps 01/07/2018  ? Allergic rhinitis 05/31/2013  ? Urticaria 11/02/2011  ? COPD, group A, by GOLD 2017 classification ([place]) 03/28/2008  ? Dyslipidemia 06/01/2007  ? Bradycardia 06/01/2007  ?  HYPERGLYCEMIA 06/01/2007  ? ?Past Surgical History:  ?Procedure Laterality Date  ? ABDOMINAL AORTOGRAM W/LOWER EXTREMITY N/A 03/05/2020  ? Procedure: ABDOMINAL AORTOGRAM W/  LOWER EXTREMITY;  Surgeon: Lorretta Harp, MD;  Location: [facility];  Service: Cardiovascular;  Laterality: N/A;  ? CATARACT EXTRACTION, BILATERAL    ? COLONOSCOPY    ? ENDARTERECTOMY FEMORAL Right 04/03/2020  ? Procedure: Right Femoral Endarterectomy;  Surgeon: Elam Dutch, MD;  Location: [facility];  Service: Vascular;  Laterality: Right;  ? ENDARTERECTOMY FEMORAL Left 09/10/2020  ? Procedure: ENDARTERECTOMY FEMORAL LEFT;  Surgeon: Elam Dutch, MD;  Location: [facility];  Service: Vascular;  Laterality: Left;  ? EYE SURGERY    ? Bilaterl cataracts  ? PATCH ANGIOPLASTY Left 09/10/2020  ? Procedure: PATCH ANGIOPLASTY OF LEFT FEMORAL ARTERY USING HEMASHIELD PLATINUM FINESSE PATCH;  Surgeon: Elam Dutch, MD;  Location: [facility];  Service: Vascular;  Laterality: Left;  ? POLYPECTOMY    ? TONSILLECTOMY AND ADENOIDECTOMY    ? ? ?Family History  ?Problem Relation Age of Onset  ? Cancer Mother   ?     "Male" cancer  ? Uterine cancer Mother   ? Heart disease Father   ? Colon cancer Neg Hx   ? Esophageal cancer Neg Hx   ? Rectal cancer Neg Hx   ? Stomach cancer Neg Hx   ? Liver cancer Neg Hx   ? Pancreatic cancer Neg Hx   ? Prostate cancer Neg Hx   ? Stroke Neg Hx   ? ? ?Medications- reviewed and updated ?Current Outpatient Medications  ?Medication Sig Dispense Refill  ? aspirin 81 MG tablet Take 81 mg by mouth daily.     ? celecoxib (CELEBREX) 200 MG capsule Take 1 capsule (200 mg total) by mouth 2 (two) times daily as needed. 60 capsule 5  ? cyanocobalamin 2000 MCG tablet Take 2,000 mcg by mouth daily.    ? EPINEPHrine (EPIPEN 2-PAK) 0.3 mg/0.3 mL IJ SOAJ injection Inject 0.3 mLs (0.3 mg total) into the muscle as needed for anaphylaxis. 1 each 0  ? fluticasone (FLONASE) 50 MCG/ACT nasal spray Place 2 sprays into both nostrils daily as needed for allergies or rhinitis.    ? fluticasone-salmeterol (ADVAIR) 250-50 MCG/ACT AEPB Inhale 1 puff into the lungs every 12 (twelve) hours. 60 each 11  ? midodrine (PROAMATINE) 5 MG tablet Take 1 tablet (5 mg total) by mouth 3 (three) times  daily with meals. Take 1 tablet at 8am, 12 noon and 4pm 270 tablet 1  ? rosuvastatin (CRESTOR) 20 MG tablet TAKE 1 TABLET(20 MG) BY MOUTH DAILY AT 6 PM 90 tablet 0  ? Multiple Vitamins-Minerals (PRESERVISION AREDS 2 PO) Take 1 capsule by mouth daily. (Patient not taking: Reported on 02/24/2022)    ? ?No current facility-administered medications for this visit.  ? ? ?Allergies-reviewed and updated ?Allergies  ?Allergen Reactions  ? Bee Venom Anaphylaxis  ?  Yellow jackets  ? Meclizine   ?  Fatigue   ? ? ?Social History  ? ?Socioeconomic History  ? Marital status: Married  ?  Spouse name: Not on file  ? Number of children: Not on file  ? Years of education: Not on file  ? Highest education level: Not on file  ?Occupational History  ? Occupation: Retired   ?Tobacco Use  ? Smoking status: Former  ?  Packs/day: 0.50  ?  Years: 40.00  ?  Pack years: 20.00  ?  Types: Cigarettes  ?  Quit date: 09/26/2017  ?  Years since quitting: 4.4  ? Smokeless tobacco: Never  ? Tobacco comments:  ?  stopped 2018  ?Vaping Use  ? Vaping Use: Never used  ?Substance and Sexual Activity  ? Alcohol use: No  ? Drug use: No  ? Sexual activity: Not on file  ?Other Topics Concern  ? Not on file  ?Social History Narrative  ? Lives at home with wife  ? Right handed  ? Caffeine: drinks de-caf drinks, diet drinks if its a coke.  ? ?Social Determinants of Health  ? ?Financial Resource Strain: Not on file  ?Food Insecurity: Not on file  ?Transportation Needs: Not on file  ?Physical Activity: Not on file  ?Stress: Not on file  ?Social Connections: Not on file  ? ?   ?  ?Objective:  ?Physical Exam: ?BP 129/74 (BP Location: Left Arm)   Pulse (!) 59   Temp 98.1 ?F (36.7 ?C) (Temporal)   Ht '5\' 11"'$  (1.803 m)   Wt 208 lb 12.8 oz (94.7 kg)   SpO2 95%   BMI 29.12 kg/m?   ?Body mass index is 29.12 kg/m?. ?Wt Readings from Last 3 Encounters:  ?02/24/22 208 lb 12.8 oz (94.7 kg)  ?02/03/22 210 lb (95.3 kg)  ?01/28/22 212 lb (96.2 kg)  ? ?Gen: NAD, resting  comfortably ?HEENT: TMs normal bilaterally. OP clear. No thyromegaly noted.  ?CV: RRR with no murmurs appreciated ?Pulm: NWOB, CTAB with no crackles, wheezes, or rhonchi ?GI: Normal bowel sounds present. Soft, Nontender,

## 2022-02-24 NOTE — Assessment & Plan Note (Signed)
Check B12.  He is on 2000 mcg daily. ?

## 2022-02-24 NOTE — Assessment & Plan Note (Signed)
On crestor '20mg'$  daily. Tolerating well. Check lipids day.  ?

## 2022-02-24 NOTE — Assessment & Plan Note (Signed)
Doing well.  No recurrences.  He will continue working vestibular rehab. ?

## 2022-02-24 NOTE — Assessment & Plan Note (Signed)
Follows with orthopedics.  Occasionally gets cortisone injections done.  He would like to have medication to use as needed for flares.  We will start Celebrex 200 mg twice daily.  He will use sparingly as needed to stay active.  We discussed potential side effects.  He will follow-up with orthopedic soon. ?

## 2022-02-24 NOTE — Assessment & Plan Note (Signed)
Continue midodrine 5 mg 3 times daily per cardiology. ?

## 2022-02-24 NOTE — Therapy (Signed)
?OUTPATIENT PHYSICAL THERAPY VESTIBULAR TREATMENT NOTE ? ? ?Patient Name: Bryan Armstrong ?MRN: [id number] ?DOB:05-Jan-1935, 87 y.o., male ?Today's Date: 02/24/2022 ? ?PCP: Vivi Barrack, MD ?REFERRING PROVIDER: Vivi Barrack, MD ? ? PT End of Session - 02/24/22 1104   ? ? Visit Number 5   ? Number of Visits 13   ? Date for PT Re-Evaluation 03/28/22   ? Authorization Type [employer] Medicare   ? Progress Note Due on Visit 10   ? PT Start Time 1102   ? PT Stop Time 6269   ? PT Time Calculation (min) 41 min   ? Equipment Utilized During Treatment Gait belt   ? Activity Tolerance Patient tolerated treatment well   ? Behavior During Therapy [facility] for tasks assessed/performed   ? ?  ?  ? ?  ? ? ? ?Past Medical History:  ?Diagnosis Date  ? BRADYCARDIA 06/01/2007  ? Cataract   ? bil cataracts removed  ? COPD (chronic obstructive pulmonary disease) ([place])   ? EMPHYSEMA 03/28/2008  ? Emphysema of lung ([place])   ? GERD (gastroesophageal reflux disease)   ? HYPERGLYCEMIA 06/01/2007  ? HYPERLIPIDEMIA 06/01/2007  ? Hypertension   ? HYPERTENSION 01/19/2009  ? Peripheral arterial disease ([place])   ? TIA (transient ischemic attack)   ? ?Past Surgical History:  ?Procedure Laterality Date  ? ABDOMINAL AORTOGRAM W/LOWER EXTREMITY N/A 03/05/2020  ? Procedure: ABDOMINAL AORTOGRAM W/  LOWER EXTREMITY;  Surgeon: Lorretta Harp, MD;  Location: [facility];  Service: Cardiovascular;  Laterality: N/A;  ? CATARACT EXTRACTION, BILATERAL    ? COLONOSCOPY    ? ENDARTERECTOMY FEMORAL Right 04/03/2020  ? Procedure: Right Femoral Endarterectomy;  Surgeon: Elam Dutch, MD;  Location: [facility];  Service: Vascular;  Laterality: Right;  ? ENDARTERECTOMY FEMORAL Left 09/10/2020  ? Procedure: ENDARTERECTOMY FEMORAL LEFT;  Surgeon: Elam Dutch, MD;  Location: [facility];  Service: Vascular;  Laterality: Left;  ? EYE SURGERY    ? Bilaterl cataracts  ? PATCH ANGIOPLASTY Left 09/10/2020  ? Procedure: PATCH ANGIOPLASTY OF LEFT FEMORAL ARTERY USING HEMASHIELD  PLATINUM FINESSE PATCH;  Surgeon: Elam Dutch, MD;  Location: [facility];  Service: Vascular;  Laterality: Left;  ? POLYPECTOMY    ? TONSILLECTOMY AND ADENOIDECTOMY    ? ?Patient Active Problem List  ? Diagnosis Date Noted  ? Vitamin B 12 deficiency 10/03/2021  ? Orthostatic hypotension 04/16/2021  ? Neuropathy 02/19/2021  ? Abnormal CXR 02/13/2021  ? BPV (benign positional vertigo) 02/13/2021  ? Hypotension 06/19/2020  ? TIA (transient ischemic attack) 04/18/2020  ? PAD (peripheral artery disease) ([place]) 04/03/2020  ? Neck pain 12/30/2018  ? GERD (gastroesophageal reflux disease) 09/28/2018  ? Peripheral arterial disease ([place]) 06/15/2018  ? Former smoker 06/15/2018  ? History of colonic polyps 01/07/2018  ? Allergic rhinitis 05/31/2013  ? Urticaria 11/02/2011  ? COPD, group A, by GOLD 2017 classification ([place]) 03/28/2008  ? Dyslipidemia 06/01/2007  ? Bradycardia 06/01/2007  ? HYPERGLYCEMIA 06/01/2007  ? ? ?ONSET DATE: 02/03/2022 ? ?REFERRING DIAG: H81.10 (ICD-10-CM) - Benign paroxysmal positional vertigo, unspecified laterality ? ?THERAPY DIAG:  ?Unsteadiness on feet ? ?Dizziness and giddiness ? ?Muscle weakness (generalized) ? ?Difficulty in walking, not elsewhere classified ? ?PERTINENT HISTORY: Recent R femoral endarterectomy, Bradycardia, COPD, Emphysema, GERD, HLD, HTN, PAD, TIA, Orthostatic Hypotension ? ?PRECAUTIONS: Other: Orthostatics ? ?SUBJECTIVE: Did the Epley over the weekend cause he feels the BPPV came back (not as strong). Reports he felt better afterwards. Feels he has  also improved from coming here the past 2 weeks.  ? ?PAIN:  ?Are you having pain? No ? ?Vitals:  ? 02/24/22 1127  ?BP: 137/70  ?Pulse: 62  ? ? ? ? ?VESTIBULAR TREATMENT: ? ?POSITIONAL TESTING:  ?Right Dix-Hallpike: none; Duration:0 ?Left Dix-Hallpike: none; Duration: 0 ? ?                         ?Gaze Adaptation: ?x1 Viewing Horizontal: Position: standing with feet apart (slightly narrow), Time: 60 seconds , Reps: 1. Performed 2 sets  of 30 seconds each with feet apart on air ex. No dizziness, just mild postural sway.  ?  ?and x1 Viewing Vertical:  Position: standing with feet apart (slightly narrow), Time: 60 seconds. Reps: 1. Performed 2 sets of 30 seconds each with feet apart on air ex. No dizziness, just mild postural sway.Incr difficulty with head nods.  ?  ?                      Reviewed progressing in standing at home to 60 seconds as pt had still been performing for 30 seconds.  ? ?Standing Balance: ?Surface: Airex ?Position: Feet Hip Width Apart Wide Base of Support  ?Completed with: Eyes Closed; Wide BOS - x10 reps head turns, x10 reps head nods, repeated with feet hip width with pt needing intermittent UE support  ? ? ?NMR:  ?- 2 sets of 5 reps, sit <> stands with EC from mat table for incr vestibular input, cues for incr forward lean from mat table as pt was initially performing with BLE bracing against mat table for balance.  ? ?-230' of gait with head motions over level indoor surfaces with supervision, pt with incr sway at times with head turns, but pt able to maintain balance. When pt went to turn to head over to the //bars for exercises, pt did not pick his L foot up high enough and tripped on his foot and almost fell forwards, pt needing to brace self on NuStep and therapist needing to provide mod/max A at gait belt to prevent pt from falling. Pt reports that this happens 1-2x a month where he doesn't pick his L foot up high enough and it gets caught. Discussed possibility of using foot up brace and trialing it in a future session to see if it would help with foot clearance. ? ?-in // bars: tandem forward gait with head turns down and back x2 reps then repeated with head nods down and back x2 reps, pt needing intermittent UE support for balance esp with head turns  ? ?-attempted alternating forward stepping strategy on air ex x8 reps with BUE support, when attempting without UE support pt unable to tolerate due to incr R knee  pain ? ?-on air ex; for SLS alternating forward cone taps x10 reps each leg, needing intermittent UE support for balance.  ? ? ? ? ? ? ?  ?PATIENT EDUCATION: ?Education details: Not worrying about doing the Epley at home since BPPV is negative, reviewed standing VOR x1 and doing it for 60 seconds at home.  ?Person educated: Patient ?Education method: Explanation ?Education comprehension: verbalized understanding ? ?HEP: ?Standing VOR x1 x60 seconds with feet apart, [id number] ?  ?  ?GOALS: ?Goals reviewed with patient? Yes ?  ?SHORT TERM GOALS: Target date: 03/07/2022 ?  ?Pt will be independent with initial HEP for improved balance and vestibular input ?Baseline: no HEP established ?Goal status: INITIAL ?  ?  2.  FGA TBA and LTG to be set as applicable ?Baseline: 16/30 on 02/10/22 ?Goal status: MET ?  ?3.  Pt will be able to hold situation 4 of M-CTSIB for >/= 15 seconds ?Baseline: 3 secs ?Goal status: INITIAL ?  ?  ?  ?LONG TERM GOALS: Target date: 03/28/2022 ?  ?Pt will be independent with final HEP for improved balance and vestibular input  ?Baseline: no HEP established ?Goal status: INITIAL ?  ?2.  Pt will improve FGA by 4 points from baseline to demonstrate improved balance and reduced fall risk ?Baseline: 16/30 ?Goal status: INITIAL ?  ?3.  Pt will improve DVA to </= 2 line difference to indicate improved VOR ?Baseline: 3 line difference ?Goal status: INITIAL ?  ?4.  Pt will report </= 1/5 for all movements on MSQ to indicate improvement in motion sensitivity and improved activity tolerance.  ?Baseline: 1-2/5 ?Goal status: INITIAL ?  ?5.  Pt will be able to hold situation 4 of M-CTSIB for >/= 20 seconds ?Baseline: 3 seconds ?Goal status: INITIAL ?  ?  ?  ?ASSESSMENT: ?  ?CLINICAL IMPRESSION: ?Assessed positional testing as pt reports he feels like his BPPV might have returned and he performed the Epley and he felt better afterwards. Pt negative for positional testing today and educated pt that he does not need to  perform the Epley at home. When pt turned today, he got his L toe caught on the floor and needing mod/max A from therapist and pt reaching out to grab NuStep to prevent a fall. Pt reports that this will rarely ha

## 2022-02-24 NOTE — Patient Instructions (Signed)
It was very nice to see you today! ? ?We wll check blood work today. ? ?Please use the Celebrex as needed. ? ?Please keep up the good work with your diet and exercise. ? ?I will see back in a year for your next physical.  Come back sooner if needed. ? ?Take care, ?Dr Jerline Pain ? ?PLEASE NOTE: ? ?If you had any lab tests please let us know if you have not heard back within a few days. You may see your results on mychart before we have a chance to review them but we will give you a call once they are reviewed by [place]. If we ordered any referrals today, please let us know if you have not heard from their office within the next week.  ? ?Please try these tips to maintain a healthy lifestyle: ? ?Eat at least 3 REAL meals and 1-2 snacks per day.  Aim for no more than 5 hours between eating.  If you eat breakfast, please do so within one hour of getting up.  ? ?Each meal should contain half fruits/vegetables, one quarter protein, and one quarter carbs (no bigger than a computer mouse) ? ?Cut down on sweet beverages. This includes juice, soda, and sweet tea.  ? ?Drink at least 1 glass of water with each meal and aim for at least 8 glasses per day ? ?Exercise at least 150 minutes every week.   ? ?Preventive Care 27 Years and Older, Male ?Preventive care refers to lifestyle choices and visits with your health care provider that can promote health and wellness. Preventive care visits are also called wellness exams. ?What can I expect for my preventive care visit? ?Counseling ?During your preventive care visit, your health care provider may ask about your: ?Medical history, including: ?Past medical problems. ?Family medical history. ?History of falls. ?Current health, including: ?Emotional well-being. ?Home life and relationship well-being. ?Sexual activity. ?Memory and ability to understand (cognition). ?Lifestyle, including: ?Alcohol, nicotine or tobacco, and drug use. ?Access to firearms. ?Diet, exercise, and sleep habits. ?Work  and work Statistician. ?Sunscreen use. ?Safety issues such as seatbelt and bike helmet use. ?Physical exam ?Your health care provider will check your: ?Height and weight. These may be used to calculate your BMI (body mass index). BMI is a measurement that tells if you are at a healthy weight. ?Waist circumference. This measures the distance around your waistline. This measurement also tells if you are at a healthy weight and may help predict your risk of certain diseases, such as type 2 diabetes and high blood pressure. ?Heart rate and blood pressure. ?Body temperature. ?Skin for abnormal spots. ?What immunizations do I need? ? ?Vaccines are usually given at various ages, according to a schedule. Your health care provider will recommend vaccines for you based on your age, medical history, and lifestyle or other factors, such as travel or where you work. ?What tests do I need? ?Screening ?Your health care provider may recommend screening tests for certain conditions. This may include: ?Lipid and cholesterol levels. ?Diabetes screening. This is done by checking your blood sugar (glucose) after you have not eaten for a while (fasting). ?Hepatitis C test. ?Hepatitis B test. ?HIV (human immunodeficiency virus) test. ?STI (sexually transmitted infection) testing, if you are at risk. ?Lung cancer screening. ?Colorectal cancer screening. ?Prostate cancer screening. ?Abdominal aortic aneurysm (AAA) screening. You may need this if you are a current or former smoker. ?Talk with your health care provider about your test results, treatment options, and if necessary, the  need for more tests. ?Follow these instructions at home: ?Eating and drinking ? ?Eat a diet that includes fresh fruits and vegetables, whole grains, lean protein, and low-fat dairy products. Limit your intake of foods with high amounts of sugar, saturated fats, and salt. ?Take vitamin and mineral supplements as recommended by your health care provider. ?Do not  drink alcohol if your health care provider tells you not to drink. ?If you drink alcohol: ?Limit how much you have to 0-2 drinks a day. ?Know how much alcohol is in your drink. In the [place] one drink equals one 12 oz bottle of beer (355 mL), one 5 oz glass of wine (148 mL), or one 1? oz glass of hard liquor (44 mL). ?Lifestyle ?Brush your teeth every morning and night with fluoride toothpaste. Floss one time each day. ?Exercise for at least 30 minutes 5 or more days each week. ?Do not use any products that contain nicotine or tobacco. These products include cigarettes, chewing tobacco, and vaping devices, such as e-cigarettes. If you need help quitting, ask your health care provider. ?Do not use drugs. ?If you are sexually active, practice safe sex. Use a condom or other form of protection to prevent STIs. ?Take aspirin only as told by your health care provider. Make sure that you understand how much to take and what form to take. Work with your health care provider to find out whether it is safe and beneficial for you to take aspirin daily. ?Ask your health care provider if you need to take a cholesterol-lowering medicine (statin). ?Find healthy ways to manage stress, such as: ?Meditation, yoga, or listening to music. ?Journaling. ?Talking to a trusted person. ?Spending time with friends and family. ?Safety ?Always wear your seat belt while driving or riding in a vehicle. ?Do not drive: ?If you have been drinking alcohol. Do not ride with someone who has been drinking. ?When you are tired or distracted. ?While texting. ?If you have been using any mind-altering substances or drugs. ?Wear a helmet and other protective equipment during sports activities. ?If you have firearms in your house, make sure you follow all gun safety procedures. ?Minimize exposure to UV radiation to reduce your risk of skin cancer. ?What's next? ?Visit your health care provider once a year for an annual wellness visit. ?Ask your health care  provider how often you should have your eyes and teeth checked. ?Stay up to date on all vaccines. ?This information is not intended to replace advice given to you by your health care provider. Make sure you discuss any questions you have with your health care provider. ?Document Revised: 03/27/2021 Document Reviewed: 03/27/2021 ?Elsevier Patient Education ? [place]. ? ?

## 2022-02-25 LAB — LIPID PANEL
Cholesterol: 105 mg/dL (ref 0–200)
HDL: 33.4 mg/dL — ABNORMAL LOW (ref >39.00)
LDL Cholesterol: 44 mg/dL (ref 0–99)
NonHDL: 71.71
Total CHOL/HDL Ratio: 3
Triglycerides: 137 mg/dL (ref 0.0–149.0)
VLDL: 27.4 mg/dL (ref 0.0–40.0)

## 2022-02-25 LAB — COMPREHENSIVE METABOLIC PANEL
ALT: 16 U/L (ref 0–53)
AST: 18 U/L (ref 0–37)
Albumin: 4.3 g/dL (ref 3.5–5.2)
Alkaline Phosphatase: 54 U/L (ref 39–117)
BUN: 27 mg/dL — ABNORMAL HIGH (ref 6–23)
CO2: 24 mEq/L (ref 19–32)
Calcium: 9.2 mg/dL (ref 8.4–10.5)
Chloride: 104 mEq/L (ref 96–112)
Creatinine, Ser: 1.14 mg/dL (ref 0.40–1.50)
GFR: 58.76 mL/min — ABNORMAL LOW (ref >60.00)
Glucose, Bld: 108 mg/dL — ABNORMAL HIGH (ref 70–99)
Potassium: 4.2 mEq/L (ref 3.5–5.1)
Sodium: 139 mEq/L (ref 135–145)
Total Bilirubin: 0.6 mg/dL (ref 0.2–1.2)
Total Protein: 7.1 g/dL (ref 6.0–8.3)

## 2022-02-25 LAB — HEMOGLOBIN A1C: Hgb A1c MFr Bld: 5.7 % (ref 4.6–6.5)

## 2022-02-27 NOTE — Progress Notes (Signed)
Please inform patient of the following:  His labs are all stable. Do not need to make any changes to his treatment plan at this time. We can recheck in 1 year.  Bryan Armstrong. Jerline Pain, MD 02/27/2022 10:12 AM

## 2022-02-28 ENCOUNTER — Encounter: Payer: Self-pay | Admitting: Physical Therapy

## 2022-02-28 ENCOUNTER — Ambulatory Visit: Payer: Medicare Other | Admitting: Physical Therapy

## 2022-02-28 DIAGNOSIS — R262 Difficulty in walking, not elsewhere classified: Secondary | ICD-10-CM | POA: Diagnosis not present

## 2022-02-28 DIAGNOSIS — M6281 Muscle weakness (generalized): Secondary | ICD-10-CM | POA: Diagnosis not present

## 2022-02-28 DIAGNOSIS — R42 Dizziness and giddiness: Secondary | ICD-10-CM

## 2022-02-28 DIAGNOSIS — R2681 Unsteadiness on feet: Secondary | ICD-10-CM

## 2022-02-28 NOTE — Therapy (Addendum)
OUTPATIENT PHYSICAL THERAPY VESTIBULAR TREATMENT NOTE   Patient Name: Bryan Armstrong MRN: [id number] DOB:[birth year], 87 y.o., male Today's Date: 02/28/2022  PCP: Vivi Barrack, MD REFERRING PROVIDER: Vivi Barrack, MD   PT End of Session - 02/28/22 1321     Visit Number 6    Number of Visits 13    Date for PT Re-Evaluation 03/28/22    Authorization Type [employer] Medicare    Progress Note Due on Visit 10    PT Start Time 1320    PT Stop Time 1400    PT Time Calculation (min) 40 min    Equipment Utilized During Treatment Gait belt    Activity Tolerance Patient tolerated treatment well    Behavior During Therapy [facility] for tasks assessed/performed              Past Medical History:  Diagnosis Date   BRADYCARDIA 06/01/2007   Cataract    bil cataracts removed   COPD (chronic obstructive pulmonary disease) ([place])    EMPHYSEMA 03/28/2008   Emphysema of lung ([place])    GERD (gastroesophageal reflux disease)    HYPERGLYCEMIA 06/01/2007   HYPERLIPIDEMIA 06/01/2007   Hypertension    HYPERTENSION 01/19/2009   Peripheral arterial disease ([place])    TIA (transient ischemic attack)    Past Surgical History:  Procedure Laterality Date   ABDOMINAL AORTOGRAM W/LOWER EXTREMITY N/A 03/05/2020   Procedure: ABDOMINAL AORTOGRAM W/  LOWER EXTREMITY;  Surgeon: Lorretta Harp, MD;  Location: [place] [facility];  Service: Cardiovascular;  Laterality: N/A;   CATARACT EXTRACTION, BILATERAL     COLONOSCOPY     ENDARTERECTOMY FEMORAL Right 04/03/2020   Procedure: Right Femoral Endarterectomy;  Surgeon: Elam Dutch, MD;  Location: [facility] OR;  Service: Vascular;  Laterality: Right;   ENDARTERECTOMY FEMORAL Left 09/10/2020   Procedure: ENDARTERECTOMY FEMORAL LEFT;  Surgeon: Elam Dutch, MD;  Location: [facility] OR;  Service: Vascular;  Laterality: Left;   EYE SURGERY     Bilaterl cataracts   PATCH ANGIOPLASTY Left 09/10/2020   Procedure: PATCH ANGIOPLASTY OF LEFT FEMORAL ARTERY USING HEMASHIELD  PLATINUM FINESSE PATCH;  Surgeon: Elam Dutch, MD;  Location: [place];  Service: Vascular;  Laterality: Left;   POLYPECTOMY     TONSILLECTOMY AND ADENOIDECTOMY     Patient Active Problem List   Diagnosis Date Noted   Osteoarthritis 02/24/2022   Vitamin B 12 deficiency 10/03/2021   Neuropathy 02/19/2021   Abnormal CXR 02/13/2021   BPV (benign positional vertigo) 02/13/2021   Hypotension 06/19/2020   TIA (transient ischemic attack) 04/18/2020   PAD (peripheral artery disease) ([place]) 04/03/2020   Neck pain 12/30/2018   GERD (gastroesophageal reflux disease) 09/28/2018   Peripheral arterial disease ([place]) 06/15/2018   Former smoker 06/15/2018   History of colonic polyps 01/07/2018   Allergic rhinitis 05/31/2013   Urticaria 11/02/2011   COPD, group A, by GOLD 2017 classification ([place]) 03/28/2008   Dyslipidemia 06/01/2007   Bradycardia 06/01/2007   HYPERGLYCEMIA 06/01/2007    ONSET DATE: 02/03/2022  REFERRING DIAG: H81.10 (ICD-10-CM) - Benign paroxysmal positional vertigo, unspecified laterality  THERAPY DIAG:  Unsteadiness on feet  Dizziness and giddiness  Muscle weakness (generalized)  Difficulty in walking, not elsewhere classified  PERTINENT HISTORY: Recent R femoral endarterectomy, Bradycardia, COPD, Emphysema, GERD, HLD, HTN, PAD, TIA, Orthostatic Hypotension  PRECAUTIONS: Other: Orthostatics  SUBJECTIVE: Nothing new since he was last here, just feeling a little foggy.   PAIN:  Are you having pain? No  There were no  vitals filed for this visit.     VESTIBULAR TREATMENT:                           Gaze Adaptation: x1 Viewing Horizontal: Position: standing with feet apart and busy background. Time: x30 seconds, 60 seconds. No dizziness, cues for continuous head movements.    and x1 Viewing Vertical:  Position: standing with feet apart and busy background. Time: x30 seconds, 60 seconds. No dizziness, pt feeling slightly more off balance with head nods.    Tandem Stance:  Surface: Airex Completed with: Eyes Open;   1/2 tandem static stance x30 seconds bilat, then x5 reps head turns and x5 reps head nods with each leg posteriorly, mild postural sway.      NMR:   Gait with ball toss vertically in the air 115', cues to look at ball with head/eyes, pt with more slowed pace when tossing ball.  Gait 115' with holding ball and moving it clockwise and then counterclockwise with cues to track with head and eyes vs, just eyes, pt stumbling a couple times but able to maintain balance.  On blue mat double folded over: holding ball and performing trunk rotations x12 reps to R and x12 reps to L to simulate swinging a golf club, cues to look at ball with head and eyes, pt more off balance when initially going to the L. Pt has reported since starting therapy that his balance with golf has improved  On blue mat; forward and retro walking with eyes closed down and back x4 reps, pt with UE support > fingertips, pt initially with veering to the L with going backwards.  On blue foam beam; wide BOS with reaching and looking up towards hands x10 reps each side, pt with incr postural sway when getting his balance in midline before going to the other side.  Min guard for balance throughout as needed.           PATIENT EDUCATION: Education details: Continue with HEP.  Person educated: Patient Education method: Explanation Education comprehension: verbalized understanding  HEP: Standing VOR x1 x60 seconds with feet apart, [id number]     GOALS: Goals reviewed with patient? Yes   SHORT TERM GOALS: Target date: 03/07/2022   Pt will be independent with initial HEP for improved balance and vestibular input Baseline: no HEP established Goal status: INITIAL   2.  FGA TBA and LTG to be set as applicable Baseline: 27/07 on 02/10/22 Goal status: MET   3.  Pt will be able to hold situation 4 of M-CTSIB for >/= 15 seconds Baseline: 3 secs Goal status:  INITIAL       LONG TERM GOALS: Target date: 03/28/2022   Pt will be independent with final HEP for improved balance and vestibular input  Baseline: no HEP established Goal status: INITIAL   2.  Pt will improve FGA by 4 points from baseline to demonstrate improved balance and reduced fall risk Baseline: 16/30 Goal status: INITIAL   3.  Pt will improve DVA to </= 2 line difference to indicate improved VOR Baseline: 3 line difference Goal status: INITIAL   4.  Pt will report </= 1/5 for all movements on MSQ to indicate improvement in motion sensitivity and improved activity tolerance.  Baseline: 1-2/5 Goal status: INITIAL   5.  Pt will be able to hold situation 4 of M-CTSIB for >/= 20 seconds Baseline: 3 seconds Goal status: INITIAL  ASSESSMENT:   CLINICAL IMPRESSION: Today's skilled session focused on VOR x1, balance on compliant surfaces, dynamic gait with head and eye motions. Pt tolerated session well with no reports of dizziness with VOR x1 with a busy background. Pt reports improvement in balance with golf since starting therapy. Will continue per POC.        OBJECTIVE IMPAIRMENTS Abnormal gait, decreased activity tolerance, decreased balance, decreased endurance, difficulty walking, decreased strength, dizziness, postural dysfunction, and pain.    ACTIVITY LIMITATIONS cleaning, community activity, yard work, and Office manager .    PERSONAL FACTORS Age, Time since onset of injury/illness/exacerbation, and 3+ comorbidities: Recent R femoral endarterectomy, Bradycardia, COPD, Emphysema, GERD, HLD, HTN, PAD, TIA  are also affecting patient's functional outcome.      REHAB POTENTIAL: Good   CLINICAL DECISION MAKING: Stable/uncomplicated   EVALUATION COMPLEXITY: Low     PLAN: PT FREQUENCY: 2x/week   PT DURATION: 6 weeks   PLANNED INTERVENTIONS: Therapeutic exercises, Therapeutic activity, Neuromuscular re-education, Balance training, Gait training, Patient/Family  education, Joint manipulation, Joint mobilization, Stair training, Vestibular training, Canalith repositioning, DME instructions, Cryotherapy, Moist heat, and Manual therapy   PLAN FOR NEXT SESSION: Monitor for BPPV if symptoms return. Progress VOR x1, balance with head motions, eyes closed, narrow BOS/tandem, and SLS tasks. Tasks to simulate golf and on unlevel surfaces. Update HEP as needed.      Arliss Journey, PT, DPT  02/28/2022, 2:00 PM

## 2022-03-03 ENCOUNTER — Ambulatory Visit: Payer: Medicare Other | Admitting: Physical Therapy

## 2022-03-03 VITALS — BP 101/53 | HR 63

## 2022-03-03 DIAGNOSIS — M6281 Muscle weakness (generalized): Secondary | ICD-10-CM | POA: Diagnosis not present

## 2022-03-03 DIAGNOSIS — R262 Difficulty in walking, not elsewhere classified: Secondary | ICD-10-CM | POA: Diagnosis not present

## 2022-03-03 DIAGNOSIS — R42 Dizziness and giddiness: Secondary | ICD-10-CM | POA: Diagnosis not present

## 2022-03-03 DIAGNOSIS — R2681 Unsteadiness on feet: Secondary | ICD-10-CM | POA: Diagnosis not present

## 2022-03-03 NOTE — Therapy (Signed)
OUTPATIENT PHYSICAL THERAPY VESTIBULAR TREATMENT NOTE   Patient Name: Bryan Armstrong MRN: [id number] DOB:08/28/1935, 87 y.o., male Today's Date: 03/03/2022  PCP: Vivi Barrack, MD REFERRING PROVIDER: Vivi Barrack, MD   PT End of Session - 03/03/22 1406     Visit Number 7    Number of Visits 13    Date for PT Re-Evaluation 03/28/22    Authorization Type [employer] Medicare    Progress Note Due on Visit 10    PT Start Time 1404    PT Stop Time 1444    PT Time Calculation (min) 40 min    Equipment Utilized During Treatment Gait belt    Activity Tolerance Patient tolerated treatment well    Behavior During Therapy [facility] for tasks assessed/performed              Past Medical History:  Diagnosis Date   BRADYCARDIA 06/01/2007   Cataract    bil cataracts removed   COPD (chronic obstructive pulmonary disease) ([place])    EMPHYSEMA 03/28/2008   Emphysema of lung ([place])    GERD (gastroesophageal reflux disease)    HYPERGLYCEMIA 06/01/2007   HYPERLIPIDEMIA 06/01/2007   Hypertension    HYPERTENSION 01/19/2009   Peripheral arterial disease ([place])    TIA (transient ischemic attack)    Past Surgical History:  Procedure Laterality Date   ABDOMINAL AORTOGRAM W/LOWER EXTREMITY N/A 03/05/2020   Procedure: ABDOMINAL AORTOGRAM W/  LOWER EXTREMITY;  Surgeon: Lorretta Harp, MD;  Location: [place] [facility];  Service: Cardiovascular;  Laterality: N/A;   CATARACT EXTRACTION, BILATERAL     COLONOSCOPY     ENDARTERECTOMY FEMORAL Right 04/03/2020   Procedure: Right Femoral Endarterectomy;  Surgeon: Elam Dutch, MD;  Location[facility] OR;  Service: Vascular;  Laterality: Right;   ENDARTERECTOMY FEMORAL Left 09/10/2020   Procedure: ENDARTERECTOMY FEMORAL LEFT;  Surgeon: Elam Dutch, MD;  Location: [facility] OR;  Service: Vascular;  Laterality: Left;   EYE SURGERY     Bilaterl cataracts   PATCH ANGIOPLASTY Left 09/10/2020   Procedure: PATCH ANGIOPLASTY OF LEFT FEMORAL ARTERY USING HEMASHIELD  PLATINUM FINESSE PATCH;  Surgeon: Elam Dutch, MD;  Location: [place];  Service: Vascular;  Laterality: Left;   POLYPECTOMY     TONSILLECTOMY AND ADENOIDECTOMY     Patient Active Problem List   Diagnosis Date Noted   Osteoarthritis 02/24/2022   Vitamin B 12 deficiency 10/03/2021   Neuropathy 02/19/2021   Abnormal CXR 02/13/2021   BPV (benign positional vertigo) 02/13/2021   Hypotension 06/19/2020   TIA (transient ischemic attack) 04/18/2020   PAD (peripheral artery disease) ([place]) 04/03/2020   Neck pain 12/30/2018   GERD (gastroesophageal reflux disease) 09/28/2018   Peripheral arterial disease ([place]) 06/15/2018   Former smoker 06/15/2018   History of colonic polyps 01/07/2018   Allergic rhinitis 05/31/2013   Urticaria 11/02/2011   COPD, group A, by GOLD 2017 classification ([place]) 03/28/2008   Dyslipidemia 06/01/2007   Bradycardia 06/01/2007   HYPERGLYCEMIA 06/01/2007    ONSET DATE: 02/03/2022  REFERRING DIAG: H81.10 (ICD-10-CM) - Benign paroxysmal positional vertigo, unspecified laterality  THERAPY DIAG:  Unsteadiness on feet  Dizziness and giddiness  Muscle weakness (generalized)  PERTINENT HISTORY: Recent R femoral endarterectomy, Bradycardia, COPD, Emphysema, GERD, HLD, HTN, PAD, TIA, Orthostatic Hypotension  PRECAUTIONS: Other: Orthostatics  SUBJECTIVE: Feeling a little bit fuzzy today. Had a good weekend.   PAIN:  Are you having pain? No  Vitals:   03/03/22 1410 03/03/22 1427  BP: (!) 98/52 Marland Kitchen)  101/53  Pulse: 62 63       VESTIBULAR TREATMENT:  Standing Balance: Surface: Airex Position: Narrow Base of Support - feet together  Completed with: Eyes Closed; 2 x 30 seconds, x15 seconds during last rep, pt's legs were more fatigued. Cues for posture as pt with tendency to lean forwards.   Tandem Stance:  Surface:  Blue mat Completed with: Eyes Open;  Time: 3/4 tandem 2 x 30 seconds bilat, full tandem x20 seconds each leg with taps for balance.         NMR:   On blue mat: tandem gait forwards down and back x3 reps, cues for slowed pace. Light fingertips for balance.  On Rockerboard in A/P direction: weight shifting x15 reps, pt with more difficulty shifting weight anteriorly. Keeping board steady 2 x10 reps head turns, 2 x 10 reps head nods. Pt needing UE support as needed for balance esp with head motions On air ex; marching with eyes closed 2 sets of 5 reps bilat - needing UE support for balance, EO x10 reps each leg no UE support.  Standing up incline; alternating SLS taps to 2 cones x12 reps each leg, then repeated with standing down incline x12 reps each leg, starting with UE support > none. Cues for gentle tap to cone for incr SLS time.   Min guard for balance throughout as needed.           PATIENT EDUCATION: Education details: Continue with HEP.  Person educated: Patient Education method: Explanation Education comprehension: verbalized understanding  HEP: Standing VOR x1 x60 seconds with feet apart, [id number]     GOALS: Goals reviewed with patient? Yes   SHORT TERM GOALS: Target date: 03/07/2022   Pt will be independent with initial HEP for improved balance and vestibular input Baseline: no HEP established Goal status: INITIAL   2.  FGA TBA and LTG to be set as applicable Baseline: 75/64 on 02/10/22 Goal status: MET   3.  Pt will be able to hold situation 4 of M-CTSIB for >/= 15 seconds Baseline: 3 secs Goal status: INITIAL       LONG TERM GOALS: Target date: 03/28/2022   Pt will be independent with final HEP for improved balance and vestibular input  Baseline: no HEP established Goal status: INITIAL   2.  Pt will improve FGA by 4 points from baseline to demonstrate improved balance and reduced fall risk Baseline: 16/30 Goal status: INITIAL   3.  Pt will improve DVA to </= 2 line difference to indicate improved VOR Baseline: 3 line difference Goal status: INITIAL   4.  Pt will report </= 1/5  for all movements on MSQ to indicate improvement in motion sensitivity and improved activity tolerance.  Baseline: 1-2/5 Goal status: INITIAL   5.  Pt will be able to hold situation 4 of M-CTSIB for >/= 20 seconds Baseline: 3 seconds Goal status: INITIAL       ASSESSMENT:   CLINICAL IMPRESSION: Pt's BP on the lower side at start of session. Pt reporting feeling better with exercise/balance tasks and pt's BP did not drop throughout session. Continued to focus on balance on compliant surfaces, SLS tasks, EC, and head motions. Pt able to perform feet together with eyes closed on foam today for 30 seconds indicating improved vestibular input for balance.  Will continue per POC.        OBJECTIVE IMPAIRMENTS Abnormal gait, decreased activity tolerance, decreased balance, decreased endurance, difficulty walking, decreased strength, dizziness, postural dysfunction,  and pain.    ACTIVITY LIMITATIONS cleaning, community activity, yard work, and Office manager .    PERSONAL FACTORS Age, Time since onset of injury/illness/exacerbation, and 3+ comorbidities: Recent R femoral endarterectomy, Bradycardia, COPD, Emphysema, GERD, HLD, HTN, PAD, TIA  are also affecting patient's functional outcome.      REHAB POTENTIAL: Good   CLINICAL DECISION MAKING: Stable/uncomplicated   EVALUATION COMPLEXITY: Low     PLAN: PT FREQUENCY: 2x/week   PT DURATION: 6 weeks   PLANNED INTERVENTIONS: Therapeutic exercises, Therapeutic activity, Neuromuscular re-education, Balance training, Gait training, Patient/Family education, Joint manipulation, Joint mobilization, Stair training, Vestibular training, Canalith repositioning, DME instructions, Cryotherapy, Moist heat, and Manual therapy   PLAN FOR NEXT SESSION: Monitor for BPPV if symptoms return. Check STGs. Probably need to update pt's HEP. Progress VOR x1, balance with head motions, eyes closed, narrow BOS/tandem, and SLS tasks. Tasks to simulate golf and on unlevel  surfaces.       Arliss Journey, PT, DPT  03/03/2022, 2:45 PM

## 2022-03-07 ENCOUNTER — Ambulatory Visit: Payer: Medicare Other

## 2022-03-07 ENCOUNTER — Ambulatory Visit: Payer: Medicare Other | Admitting: Physical Therapy

## 2022-03-07 VITALS — BP 148/90 | HR 58

## 2022-03-07 DIAGNOSIS — R262 Difficulty in walking, not elsewhere classified: Secondary | ICD-10-CM

## 2022-03-07 DIAGNOSIS — M6281 Muscle weakness (generalized): Secondary | ICD-10-CM

## 2022-03-07 DIAGNOSIS — R2681 Unsteadiness on feet: Secondary | ICD-10-CM | POA: Diagnosis not present

## 2022-03-07 DIAGNOSIS — R42 Dizziness and giddiness: Secondary | ICD-10-CM | POA: Diagnosis not present

## 2022-03-07 NOTE — Therapy (Signed)
OUTPATIENT PHYSICAL THERAPY VESTIBULAR TREATMENT NOTE   Patient Name: Bryan Armstrong MRN: [id number] DOB:[birth year], 87 y.o., male Today's Date: 03/07/2022  PCP: Vivi Barrack, MD REFERRING PROVIDER: Vivi Barrack, MD   PT End of Session - 03/07/22 1441     Visit Number 8    Number of Visits 13    Date for PT Re-Evaluation 03/28/22    Authorization Type [employer] Medicare    Progress Note Due on Visit 10    PT Start Time 1445    PT Stop Time 1530    PT Time Calculation (min) 45 min    Equipment Utilized During Treatment Gait belt    Activity Tolerance Patient tolerated treatment well    Behavior During Therapy [facility] for tasks assessed/performed               Past Medical History:  Diagnosis Date   BRADYCARDIA 06/01/2007   Cataract    bil cataracts removed   COPD (chronic obstructive pulmonary disease) ([place])    EMPHYSEMA 03/28/2008   Emphysema of lung ([place])    GERD (gastroesophageal reflux disease)    HYPERGLYCEMIA 06/01/2007   HYPERLIPIDEMIA 06/01/2007   Hypertension    HYPERTENSION 01/19/2009   Peripheral arterial disease ([place])    TIA (transient ischemic attack)    Past Surgical History:  Procedure Laterality Date   ABDOMINAL AORTOGRAM W/LOWER EXTREMITY N/A 03/05/2020   Procedure: ABDOMINAL AORTOGRAM W/  LOWER EXTREMITY;  Surgeon: Lorretta Harp, MD;  Location: [place] [facility];  Service: Cardiovascular;  Laterality: N/A;   CATARACT EXTRACTION, BILATERAL     COLONOSCOPY     ENDARTERECTOMY FEMORAL Right 04/03/2020   Procedure: Right Femoral Endarterectomy;  Surgeon: Elam Dutch, MD;  Location: [facility] OR;  Service: Vascular;  Laterality: Right;   ENDARTERECTOMY FEMORAL Left 09/10/2020   Procedure: ENDARTERECTOMY FEMORAL LEFT;  Surgeon: Elam Dutch, MD;  Location: [facility] OR;  Service: Vascular;  Laterality: Left;   EYE SURGERY     Bilaterl cataracts   PATCH ANGIOPLASTY Left 09/10/2020   Procedure: PATCH ANGIOPLASTY OF LEFT FEMORAL ARTERY USING HEMASHIELD  PLATINUM FINESSE PATCH;  Surgeon: Elam Dutch, MD;  Location: [place];  Service: Vascular;  Laterality: Left;   POLYPECTOMY     TONSILLECTOMY AND ADENOIDECTOMY     Patient Active Problem List   Diagnosis Date Noted   Osteoarthritis 02/24/2022   Vitamin B 12 deficiency 10/03/2021   Neuropathy 02/19/2021   Abnormal CXR 02/13/2021   BPV (benign positional vertigo) 02/13/2021   Hypotension 06/19/2020   TIA (transient ischemic attack) 04/18/2020   PAD (peripheral artery disease) ([place]) 04/03/2020   Neck pain 12/30/2018   GERD (gastroesophageal reflux disease) 09/28/2018   Peripheral arterial disease ([place]) 06/15/2018   Former smoker 06/15/2018   History of colonic polyps 01/07/2018   Allergic rhinitis 05/31/2013   Urticaria 11/02/2011   COPD, group A, by GOLD 2017 classification ([place]) 03/28/2008   Dyslipidemia 06/01/2007   Bradycardia 06/01/2007   HYPERGLYCEMIA 06/01/2007    ONSET DATE: 02/03/2022  REFERRING DIAG: H81.10 (ICD-10-CM) - Benign paroxysmal positional vertigo, unspecified laterality  THERAPY DIAG:  Unsteadiness on feet  Muscle weakness (generalized)  Dizziness and giddiness  Difficulty in walking, not elsewhere classified  PERTINENT HISTORY: Recent R femoral endarterectomy, Bradycardia, COPD, Emphysema, GERD, HLD, HTN, PAD, TIA, Orthostatic Hypotension  PRECAUTIONS: Other: Orthostatics  SUBJECTIVE: Patient reports feels like that the dizziness feels is due to the dizziness. No new changes/complaints.  PAIN:  Are you having pain? No  Today's Vitals   03/07/22 1452 03/07/22 1457 03/07/22 1521  BP: (!) 180/102 (!) 158/98 (!) 148/90  Pulse: (!) 58     There is no height or weight on file to calculate BMI.   VESTIBULAR TREATMENT: Session limited at start due to elevated BP readings (see above for readings). Seated rest break with reassessment with improvements noted. PT educating to monitor at home.   M-CTSIB: Situation 1 30 secs; Situation 2 30 secs;  Situation 3 25 secs; Situation 4 26 secs Patient demo significant improvements with patient outside of // bars, as has tendency to rely on support if it is near. Educated on continuining HEP at home but trialing to complete without use of support, only use AS NEEDED.   Reviewed the following HEP. No progressions made due to continue balance challenge Access Code: [email or web address] URL: [email or web address] Date: 03/07/2022 Prepared by: Baldomero Lamy  Exercises - Romberg Stance Eyes Closed on Foam Pad  - 1 x daily - 5 x weekly - 1 sets - 3 reps - 30 hold - Romberg Stance with Head Nods on Foam Pad  - 1 x daily - 5 x weekly - 2 sets - 10 reps - Romberg Stance on Foam Pad with Head Rotation  - 1 x daily - 5 x weekly - 2 sets - 10 reps - Tandem Walking with Counter Support  - 1 x daily - 5 x weekly - 1 sets - 3 reps    Gaze Stabilization:  VOR x 1 Horizontal: standing bil stance, then progressed to narrow BOS. Completed x 60 seconds. No Symptoms but increased postural sway noted VOR x 1 Vertical: standing bil stance, then progressed to narrow BOS. Completed x 60 seconds. No Symptoms but increased postural sway noted  Updated HEP:  Gaze Stabilization: Standing Feet Together    Feet together, keeping eyes on target on wall 3-4 feet away, tilt head down 15-30 and move head side to side for 60 seconds. Repeat while moving head up and down for 60 seconds. Do 2-3 sessions per day.  Copyright  VHI. All rights reserved.    PATIENT EDUCATION: Education details: Progress toward STGs; Updated HEP Person educated: Patient Education method: Explanation Education comprehension: verbalized understanding  HEP: Standing VOR x1 x60 seconds with feet apart, [id number]     GOALS: Goals reviewed with patient? Yes   SHORT TERM GOALS: Target date: 03/07/2022   Pt will be independent with initial HEP for improved balance and vestibular input Baseline: no HEP established; HEP established  reports independence Goal status:MET   2.  FGA TBA and LTG to be set as applicable Baseline: 35/36 on 02/10/22 Goal status: MET   3.  Pt will be able to hold situation 4 of M-CTSIB for >/= 15 seconds Baseline: 3 secs; 26 seconds Goal status: MET       LONG TERM GOALS: Target date: 03/28/2022   Pt will be independent with final HEP for improved balance and vestibular input  Baseline: no HEP established Goal status: INITIAL   2.  Pt will improve FGA by 4 points from baseline to demonstrate improved balance and reduced fall risk Baseline: 16/30 Goal status: INITIAL   3.  Pt will improve DVA to </= 2 line difference to indicate improved VOR Baseline: 3 line difference Goal status: INITIAL   4.  Pt will report </= 1/5 for all movements on MSQ to indicate improvement in motion sensitivity and improved activity tolerance.  Baseline: 1-2/5 Goal status: INITIAL   5.  Pt will be able to hold situation 4 of M-CTSIB for >/= 20 seconds Baseline: 3 seconds Goal status: INITIAL       ASSESSMENT:   CLINICAL IMPRESSION: Today's skilled PT session focused on assessment of patient's progress toward STGs. Patient able to meet all STGs. Demonstrating improved balance with situation 4 of M-CTSIB able to hold on average for 26 seconds outside of // bars. Session was limited initially due to elevated BP, educated to monitor at home. Will continue per POC.        OBJECTIVE IMPAIRMENTS Abnormal gait, decreased activity tolerance, decreased balance, decreased endurance, difficulty walking, decreased strength, dizziness, postural dysfunction, and pain.    ACTIVITY LIMITATIONS cleaning, community activity, yard work, and Office manager .    PERSONAL FACTORS Age, Time since onset of injury/illness/exacerbation, and 3+ comorbidities: Recent R femoral endarterectomy, Bradycardia, COPD, Emphysema, GERD, HLD, HTN, PAD, TIA  are also affecting patient's functional outcome.      REHAB POTENTIAL: Good   CLINICAL  DECISION MAKING: Stable/uncomplicated   EVALUATION COMPLEXITY: Low     PLAN: PT FREQUENCY: 2x/week   PT DURATION: 6 weeks   PLANNED INTERVENTIONS: Therapeutic exercises, Therapeutic activity, Neuromuscular re-education, Balance training, Gait training, Patient/Family education, Joint manipulation, Joint mobilization, Stair training, Vestibular training, Canalith repositioning, DME instructions, Cryotherapy, Moist heat, and Manual therapy   PLAN FOR NEXT SESSION: Monitor for BPPV if symptoms return. Progress VOR x1, balance with head motions, eyes closed, narrow BOS/tandem, and SLS tasks. Tasks to simulate golf and on unlevel surfaces.       Jones Bales, PT, DPT  03/07/2022, 3:39 PM

## 2022-03-07 NOTE — Patient Instructions (Signed)
Gaze Stabilization: Standing Feet Together    Feet together, keeping eyes on target on wall 3-4 feet away, tilt head down 15-30 and move head side to side for 60 seconds. Repeat while moving head up and down for 60 seconds. Do 2-3 sessions per day.  Copyright  VHI. All rights reserved.

## 2022-03-11 ENCOUNTER — Ambulatory Visit: Payer: Medicare Other

## 2022-03-11 VITALS — BP 150/86 | HR 67

## 2022-03-11 DIAGNOSIS — R42 Dizziness and giddiness: Secondary | ICD-10-CM

## 2022-03-11 DIAGNOSIS — M6281 Muscle weakness (generalized): Secondary | ICD-10-CM | POA: Diagnosis not present

## 2022-03-11 DIAGNOSIS — R262 Difficulty in walking, not elsewhere classified: Secondary | ICD-10-CM | POA: Diagnosis not present

## 2022-03-11 DIAGNOSIS — R2681 Unsteadiness on feet: Secondary | ICD-10-CM

## 2022-03-11 NOTE — Therapy (Signed)
OUTPATIENT PHYSICAL THERAPY VESTIBULAR TREATMENT NOTE   Patient Name: Bryan Armstrong MRN: [id number] DOB:07/21/1935, 87 y.o., male Today's Date: 03/11/2022  PCP: Vivi Barrack, MD REFERRING PROVIDER: Vivi Barrack, MD   PT End of Session - 03/11/22 1525     Visit Number 9    Number of Visits 13    Date for PT Re-Evaluation 03/28/22    Authorization Type [employer] Medicare    Progress Note Due on Visit 10    PT Start Time 1525    PT Stop Time 1610    PT Time Calculation (min) 45 min    Equipment Utilized During Treatment --    Activity Tolerance Patient tolerated treatment well    Behavior During Therapy [facility] for tasks assessed/performed              Past Medical History:  Diagnosis Date   BRADYCARDIA 06/01/2007   Cataract    bil cataracts removed   COPD (chronic obstructive pulmonary disease) ([place])    EMPHYSEMA 03/28/2008   Emphysema of lung ([place])    GERD (gastroesophageal reflux disease)    HYPERGLYCEMIA 06/01/2007   HYPERLIPIDEMIA 06/01/2007   Hypertension    HYPERTENSION 01/19/2009   Peripheral arterial disease ([place])    TIA (transient ischemic attack)    Past Surgical History:  Procedure Laterality Date   ABDOMINAL AORTOGRAM W/LOWER EXTREMITY N/A 03/05/2020   Procedure: ABDOMINAL AORTOGRAM W/  LOWER EXTREMITY;  Surgeon: Lorretta Harp, MD;  Location: [place] [facility];  Service: Cardiovascular;  Laterality: N/A;   CATARACT EXTRACTION, BILATERAL     COLONOSCOPY     ENDARTERECTOMY FEMORAL Right 04/03/2020   Procedure: Right Femoral Endarterectomy;  Surgeon: Elam Dutch, MD;  Location: [facility] OR;  Service: Vascular;  Laterality: Right;   ENDARTERECTOMY FEMORAL Left 09/10/2020   Procedure: ENDARTERECTOMY FEMORAL LEFT;  Surgeon: Elam Dutch, MD;  Location: [facility] OR;  Service: Vascular;  Laterality: Left;   EYE SURGERY     Bilaterl cataracts   PATCH ANGIOPLASTY Left 09/10/2020   Procedure: PATCH ANGIOPLASTY OF LEFT FEMORAL ARTERY USING HEMASHIELD PLATINUM  FINESSE PATCH;  Surgeon: Elam Dutch, MD;  Location: [place];  Service: Vascular;  Laterality: Left;   POLYPECTOMY     TONSILLECTOMY AND ADENOIDECTOMY     Patient Active Problem List   Diagnosis Date Noted   Osteoarthritis 02/24/2022   Vitamin B 12 deficiency 10/03/2021   Neuropathy 02/19/2021   Abnormal CXR 02/13/2021   BPV (benign positional vertigo) 02/13/2021   Hypotension 06/19/2020   TIA (transient ischemic attack) 04/18/2020   PAD (peripheral artery disease) ([place]) 04/03/2020   Neck pain 12/30/2018   GERD (gastroesophageal reflux disease) 09/28/2018   Peripheral arterial disease ([place]) 06/15/2018   Former smoker 06/15/2018   History of colonic polyps 01/07/2018   Allergic rhinitis 05/31/2013   Urticaria 11/02/2011   COPD, group A, by GOLD 2017 classification ([place]) 03/28/2008   Dyslipidemia 06/01/2007   Bradycardia 06/01/2007   HYPERGLYCEMIA 06/01/2007    ONSET DATE: 02/03/2022  REFERRING DIAG: H81.10 (ICD-10-CM) - Benign paroxysmal positional vertigo, unspecified laterality  THERAPY DIAG:  Unsteadiness on feet  Muscle weakness (generalized)  Dizziness and giddiness  Difficulty in walking, not elsewhere classified  PERTINENT HISTORY: Recent R femoral endarterectomy, Bradycardia, COPD, Emphysema, GERD, HLD, HTN, PAD, TIA, Orthostatic Hypotension  PRECAUTIONS: Other: Orthostatics  SUBJECTIVE: Reports he thought that the elevated BP readings at last session may have been due to the Celebrex. Reports no other new changes/complaints.   PAIN:  Are you having pain? No  Today's Vitals   03/11/22 1537  BP: (!) 150/86  Pulse: 67    There is no height or weight on file to calculate BMI.   VESTIBULAR TREATMENT: Standing Balance: Surface: [employer]ire[employer] Position: Narrow Base of Support Feet Hip Width Apart Completed with: Eyes Open; eyes open with narrow BOS x 30 seconds, then narrow BOS and horiz/vertical head turns x 10 reps each.   Then with feet hip width  completed eyes closed with Head Turns x 10 Reps and Head Nods x 10 Reps. Then with romberg completed eyes closed, 3 x 30 seconds. CGA. More challenge noted with vertical > horizontal.   Rockerboard: Standing on Rockerboard A/P: Completed Eyes Open Maintaining board steady x 30 seconds, then with eyes open completed horizontal/vertical head turns x 10 reps each direction. Then completed eyes closed without head movement, completed 3 x 30 seconds. Increased posterior sway but improved balance reactions noted today  Then completed anterior/posterior weight shift x 15 reps working on improved balance strategies, increased challenge with anterior as patient demo increased posterior sway and weight shift. CGA intermittent.    Ambulation with Head Turns and Obstacle Negotiation: Completed ambulation with addition of horizontal/vertical head movement upon command, along with cone obstacle negotiation with toe tap to cone prior to step over to promote SLS, completed x 345'. Increased SLS challenge noted.   Intermittent rest breaks. Vitals monitored with them maintaining steady with activity.    PATIENT EDUCATION: Education details: Continue HEP Person educated: Patient Education method: Explanation Education comprehension: verbalized understanding  HEP: Standing VOR x1 x60 seconds with feet apart, [id number]     GOALS: Goals reviewed with patient? Yes   SHORT TERM GOALS: Target date: 03/07/2022   Pt will be independent with initial HEP for improved balance and vestibular input Baseline: no HEP established; HEP established reports independence Goal status:MET   2.  FGA TBA and LTG to be set as applicable Baseline: 97/67 on 02/10/22 Goal status: MET   3.  Pt will be able to hold situation 4 of M-CTSIB for >/= 15 seconds Baseline: 3 secs; 26 seconds Goal status: MET       LONG TERM GOALS: Target date: 03/28/2022   Pt will be independent with final HEP for improved balance and vestibular  input  Baseline: no HEP established Goal status: INITIAL   2.  Pt will improve FGA by 4 points from baseline to demonstrate improved balance and reduced fall risk Baseline: 16/30 Goal status: INITIAL   3.  Pt will improve DVA to </= 2 line difference to indicate improved VOR Baseline: 3 line difference Goal status: INITIAL   4.  Pt will report </= 1/5 for all movements on MSQ to indicate improvement in motion sensitivity and improved activity tolerance.  Baseline: 1-2/5 Goal status: INITIAL   5.  Pt will be able to hold situation 4 of M-CTSIB for >/= 20 seconds Baseline: 3 seconds Goal status: INITIAL       ASSESSMENT:   CLINICAL IMPRESSION: Today's skilled PT session focused on continued balance activities on complaint surfaces and activities to promote improved balance strategies. Continue to demo increased posterior sway but improved balance reaction noted today. Vitals stable. Will continue per POC.    OBJECTIVE IMPAIRMENTS Abnormal gait, decreased activity tolerance, decreased balance, decreased endurance, difficulty walking, decreased strength, dizziness, postural dysfunction, and pain.    ACTIVITY LIMITATIONS cleaning, community activity, yard work, and Office manager .    PERSONAL FACTORS Age, Time  since onset of injury/illness/exacerbation, and 3+ comorbidities: Recent R femoral endarterectomy, Bradycardia, COPD, Emphysema, GERD, HLD, HTN, PAD, TIA  are also affecting patient's functional outcome.      REHAB POTENTIAL: Good   CLINICAL DECISION MAKING: Stable/uncomplicated   EVALUATION COMPLEXITY: Low     PLAN: PT FREQUENCY: 2x/week   PT DURATION: 6 weeks   PLANNED INTERVENTIONS: Therapeutic exercises, Therapeutic activity, Neuromuscular re-education, Balance training, Gait training, Patient/Family education, Joint manipulation, Joint mobilization, Stair training, Vestibular training, Canalith repositioning, DME instructions, Cryotherapy, Moist heat, and Manual therapy    PLAN FOR NEXT SESSION: Monitor for BPPV if symptoms return. Progress VOR x1, balance with head motions, eyes closed, narrow BOS/tandem, and SLS tasks. Tasks to simulate golf and on unlevel surfaces.       Jones Bales, PT, DPT  03/11/2022, 4:16 PM

## 2022-03-14 ENCOUNTER — Ambulatory Visit: Payer: Medicare Other | Attending: Family Medicine

## 2022-03-14 VITALS — BP 136/68 | HR 64

## 2022-03-14 DIAGNOSIS — R262 Difficulty in walking, not elsewhere classified: Secondary | ICD-10-CM | POA: Insufficient documentation

## 2022-03-14 DIAGNOSIS — R2681 Unsteadiness on feet: Secondary | ICD-10-CM | POA: Insufficient documentation

## 2022-03-14 DIAGNOSIS — M6281 Muscle weakness (generalized): Secondary | ICD-10-CM | POA: Insufficient documentation

## 2022-03-14 DIAGNOSIS — R42 Dizziness and giddiness: Secondary | ICD-10-CM | POA: Diagnosis not present

## 2022-03-14 NOTE — Therapy (Signed)
OUTPATIENT PHYSICAL THERAPY VESTIBULAR TREATMENT NOTE   Patient Name: Bryan Armstrong MRN: [id number] DOB:Dec 05, 1934, 87 y.o., male Today's Date: 03/14/2022  PCP: Vivi Barrack, MD REFERRING PROVIDER: Vivi Barrack, MD   PT End of Session - 03/14/22 1403     Visit Number 10    Number of Visits 13    Date for PT Re-Evaluation 03/28/22    Authorization Type [employer] Medicare    Progress Note Due on Visit 10    PT Start Time 1403    PT Stop Time 9211    PT Time Calculation (min) 42 min    Activity Tolerance Patient tolerated treatment well    Behavior During Therapy [facility] for tasks assessed/performed              Past Medical History:  Diagnosis Date   BRADYCARDIA 06/01/2007   Cataract    bil cataracts removed   COPD (chronic obstructive pulmonary disease) ([place])    EMPHYSEMA 03/28/2008   Emphysema of lung ([place])    GERD (gastroesophageal reflux disease)    HYPERGLYCEMIA 06/01/2007   HYPERLIPIDEMIA 06/01/2007   Hypertension    HYPERTENSION 01/19/2009   Peripheral arterial disease ([place])    TIA (transient ischemic attack)    Past Surgical History:  Procedure Laterality Date   ABDOMINAL AORTOGRAM W/LOWER EXTREMITY N/A 03/05/2020   Procedure: ABDOMINAL AORTOGRAM W/  LOWER EXTREMITY;  Surgeon: Lorretta Harp, MD;  Location: [place] [facility];  Service: Cardiovascular;  Laterality: N/A;   CATARACT EXTRACTION, BILATERAL     COLONOSCOPY     ENDARTERECTOMY FEMORAL Right 04/03/2020   Procedure: Right Femoral Endarterectomy;  Surgeon: Elam Dutch, MD;  Location: [facility] OR;  Service: Vascular;  Laterality: Right;   ENDARTERECTOMY FEMORAL Left 09/10/2020   Procedure: ENDARTERECTOMY FEMORAL LEFT;  Surgeon: Elam Dutch, MD;  Location: [facility] OR;  Service: Vascular;  Laterality: Left;   EYE SURGERY     Bilaterl cataracts   PATCH ANGIOPLASTY Left 09/10/2020   Procedure: PATCH ANGIOPLASTY OF LEFT FEMORAL ARTERY USING HEMASHIELD PLATINUM FINESSE PATCH;  Surgeon: Elam Dutch, MD;   Location: [place];  Service: Vascular;  Laterality: Left;   POLYPECTOMY     TONSILLECTOMY AND ADENOIDECTOMY     Patient Active Problem List   Diagnosis Date Noted   Osteoarthritis 02/24/2022   Vitamin B 12 deficiency 10/03/2021   Neuropathy 02/19/2021   Abnormal CXR 02/13/2021   BPV (benign positional vertigo) 02/13/2021   Hypotension 06/19/2020   TIA (transient ischemic attack) 04/18/2020   PAD (peripheral artery disease) ([place]) 04/03/2020   Neck pain 12/30/2018   GERD (gastroesophageal reflux disease) 09/28/2018   Peripheral arterial disease ([place]) 06/15/2018   Former smoker 06/15/2018   History of colonic polyps 01/07/2018   Allergic rhinitis 05/31/2013   Urticaria 11/02/2011   COPD, group A, by GOLD 2017 classification ([place]) 03/28/2008   Dyslipidemia 06/01/2007   Bradycardia 06/01/2007   HYPERGLYCEMIA 06/01/2007    ONSET DATE: 02/03/2022  REFERRING DIAG: H81.10 (ICD-10-CM) - Benign paroxysmal positional vertigo, unspecified laterality  THERAPY DIAG:  Unsteadiness on feet  Muscle weakness (generalized)  Dizziness and giddiness  Difficulty in walking, not elsewhere classified  PERTINENT HISTORY: Recent R femoral endarterectomy, Bradycardia, COPD, Emphysema, GERD, HLD, HTN, PAD, TIA, Orthostatic Hypotension  PRECAUTIONS: Other: Orthostatics  SUBJECTIVE: Patient reports no new changes/complaints. No  PAIN:  Are you having pain? No  Today's Vitals   03/14/22 1407  BP: 136/68  Pulse: 64   There is no height or  weight on file to calculate BMI.   VESTIBULAR TREATMENT: GAIT: Gait pattern:  general unsteadiness, increased veering noted, decreased step length- Left, decreased stance time- Right, and wide BOS Distance walked: 600 ft Assistive device utilized: None Level of assistance: SBA/CGA Comments: mild unsteadiness noted with ambulation on grass surfaces   Completed gait outdoors on unlevel grass surfaces, completed ambulation with horizontal/vertical head  turns x 50'. Unsteadiness noted, CGA required. Pt reporting increased pain/discomfort. Pt required seated rest break after ambulation outdoors due to fatigue and cramping/discomfort reported in BLE (reports mainly occurs in calf region vs knee).    NMR: Standing on Foam Balance Beam: Completed static standing eyes open with horizontal/vertical head turns x 10 reps each direction. Then completed eyes closed without head movement, completed 3 x 30 seconds. Increased posterior sway but improved balance reactions noted.   Stepping Strategy: Standing on airex completed alternating step forward/back onto mat, increased challenge with SLS on RLE requiring UE support. Completed x 10 reps with UE support. Then added in horizontal head turns with step forward, completed x 10 reps bilat.   Side Stepping: On foam blue balance beam completed lateral side stepping down and back x 3 laps, progressing from light touch to no UE support. CGA   PATIENT EDUCATION: Education details: Continue HEP Person educated: Patient Education method: Explanation Education comprehension: verbalized understanding  HEP: Standing VOR x1 x60 seconds with feet apart, [id number]     GOALS: Goals reviewed with patient? Yes   SHORT TERM GOALS: Target date: 03/07/2022   Pt will be independent with initial HEP for improved balance and vestibular input Baseline: no HEP established; HEP established reports independence Goal status:MET   2.  FGA TBA and LTG to be set as applicable Baseline: 20/60 on 02/10/22 Goal status: MET   3.  Pt will be able to hold situation 4 of M-CTSIB for >/= 15 seconds Baseline: 3 secs; 26 seconds Goal status: MET       LONG TERM GOALS: Target date: 03/28/2022   Pt will be independent with final HEP for improved balance and vestibular input  Baseline: no HEP established Goal status: INITIAL   2.  Pt will improve FGA by 4 points from baseline to demonstrate improved balance and reduced fall  risk Baseline: 16/30 Goal status: INITIAL   3.  Pt will improve DVA to </= 2 line difference to indicate improved VOR Baseline: 3 line difference Goal status: INITIAL   4.  Pt will report </= 1/5 for all movements on MSQ to indicate improvement in motion sensitivity and improved activity tolerance.  Baseline: 1-2/5 Goal status: INITIAL   5.  Pt will be able to hold situation 4 of M-CTSIB for >/= 20 seconds Baseline: 3 seconds Goal status: INITIAL       ASSESSMENT:   CLINICAL IMPRESSION: Completed gait outdoors, with increased fatigue noted with longer distance ambulation due to discomfort in BLE's. Did completed some balance exercises outdoors but limited due to fatigue/pain. Rest of session spent working on balance activities on complaint surfaces with head turns to further improve vestibular input.    OBJECTIVE IMPAIRMENTS Abnormal gait, decreased activity tolerance, decreased balance, decreased endurance, difficulty walking, decreased strength, dizziness, postural dysfunction, and pain.    ACTIVITY LIMITATIONS cleaning, community activity, yard work, and Office manager .    PERSONAL FACTORS Age, Time since onset of injury/illness/exacerbation, and 3+ comorbidities: Recent R femoral endarterectomy, Bradycardia, COPD, Emphysema, GERD, HLD, HTN, PAD, TIA  are also affecting patient's functional outcome.  REHAB POTENTIAL: Good   CLINICAL DECISION MAKING: Stable/uncomplicated   EVALUATION COMPLEXITY: Low     PLAN: PT FREQUENCY: 2x/week   PT DURATION: 6 weeks   PLANNED INTERVENTIONS: Therapeutic exercises, Therapeutic activity, Neuromuscular re-education, Balance training, Gait training, Patient/Family education, Joint manipulation, Joint mobilization, Stair training, Vestibular training, Canalith repositioning, DME instructions, Cryotherapy, Moist heat, and Manual therapy   PLAN FOR NEXT SESSION: Monitor for BPPV if symptoms return. Progress VOR x1, balance with head motions, eyes  closed, narrow BOS/tandem, and SLS tasks. Tasks to simulate golf and on unlevel surfaces.     Jones Bales, PT, DPT  03/14/2022, 3:08 PM

## 2022-03-17 ENCOUNTER — Ambulatory Visit: Payer: Medicare Other

## 2022-03-17 VITALS — BP 130/74

## 2022-03-17 DIAGNOSIS — M6281 Muscle weakness (generalized): Secondary | ICD-10-CM

## 2022-03-17 DIAGNOSIS — R262 Difficulty in walking, not elsewhere classified: Secondary | ICD-10-CM | POA: Diagnosis not present

## 2022-03-17 DIAGNOSIS — R2681 Unsteadiness on feet: Secondary | ICD-10-CM

## 2022-03-17 DIAGNOSIS — R42 Dizziness and giddiness: Secondary | ICD-10-CM | POA: Diagnosis not present

## 2022-03-17 NOTE — Therapy (Signed)
OUTPATIENT PHYSICAL THERAPY VESTIBULAR TREATMENT NOTE   Patient Name: Bryan Armstrong MRN: [id number] DOB:January 05, 1935, 87 y.o., male Today's Date: 03/17/2022  PCP: Vivi Barrack, MD REFERRING PROVIDER: Vivi Barrack, MD   PT End of Session - 03/17/22 1403     Visit Number 11    Number of Visits 13    Date for PT Re-Evaluation 03/28/22    Authorization Type [employer] Medicare    Progress Note Due on Visit 10    PT Start Time 1401    PT Stop Time 1443    PT Time Calculation (min) 42 min    Activity Tolerance Patient tolerated treatment well    Behavior During Therapy [facility] for tasks assessed/performed              Past Medical History:  Diagnosis Date   BRADYCARDIA 06/01/2007   Cataract    bil cataracts removed   COPD (chronic obstructive pulmonary disease) ([place])    EMPHYSEMA 03/28/2008   Emphysema of lung ([place])    GERD (gastroesophageal reflux disease)    HYPERGLYCEMIA 06/01/2007   HYPERLIPIDEMIA 06/01/2007   Hypertension    HYPERTENSION 01/19/2009   Peripheral arterial disease ([place])    TIA (transient ischemic attack)    Past Surgical History:  Procedure Laterality Date   ABDOMINAL AORTOGRAM W/LOWER EXTREMITY N/A 03/05/2020   Procedure: ABDOMINAL AORTOGRAM W/  LOWER EXTREMITY;  Surgeon: Lorretta Harp, MD;  Location: [place] [facility];  Service: Cardiovascular;  Laterality: N/A;   CATARACT EXTRACTION, BILATERAL     COLONOSCOPY     ENDARTERECTOMY FEMORAL Right 04/03/2020   Procedure: Right Femoral Endarterectomy;  Surgeon: Elam Dutch, MD;  Location: [facility] OR;  Service: Vascular;  Laterality: Right;   ENDARTERECTOMY FEMORAL Left 09/10/2020   Procedure: ENDARTERECTOMY FEMORAL LEFT;  Surgeon: Elam Dutch, MD;  Location: [facility] OR;  Service: Vascular;  Laterality: Left;   EYE SURGERY     Bilaterl cataracts   PATCH ANGIOPLASTY Left 09/10/2020   Procedure: PATCH ANGIOPLASTY OF LEFT FEMORAL ARTERY USING HEMASHIELD PLATINUM FINESSE PATCH;  Surgeon: Elam Dutch, MD;   Location: [place];  Service: Vascular;  Laterality: Left;   POLYPECTOMY     TONSILLECTOMY AND ADENOIDECTOMY     Patient Active Problem List   Diagnosis Date Noted   Osteoarthritis 02/24/2022   Vitamin B 12 deficiency 10/03/2021   Neuropathy 02/19/2021   Abnormal CXR 02/13/2021   BPV (benign positional vertigo) 02/13/2021   Hypotension 06/19/2020   TIA (transient ischemic attack) 04/18/2020   PAD (peripheral artery disease) ([place]) 04/03/2020   Neck pain 12/30/2018   GERD (gastroesophageal reflux disease) 09/28/2018   Peripheral arterial disease ([place]) 06/15/2018   Former smoker 06/15/2018   History of colonic polyps 01/07/2018   Allergic rhinitis 05/31/2013   Urticaria 11/02/2011   COPD, group A, by GOLD 2017 classification ([place]) 03/28/2008   Dyslipidemia 06/01/2007   Bradycardia 06/01/2007   HYPERGLYCEMIA 06/01/2007    ONSET DATE: 02/03/2022  REFERRING DIAG: H81.10 (ICD-10-CM) - Benign paroxysmal positional vertigo, unspecified laterality  THERAPY DIAG:  Unsteadiness on feet  Muscle weakness (generalized)  Dizziness and giddiness  Difficulty in walking, not elsewhere classified  PERTINENT HISTORY: Recent R femoral endarterectomy, Bradycardia, COPD, Emphysema, GERD, HLD, HTN, PAD, TIA, Orthostatic Hypotension  PRECAUTIONS: Other: Orthostatics  SUBJECTIVE: Patient reports has felt funny headed in the head, denies any other abnormal symptoms. No new changes/complaints. No falls.   PAIN:  Are you having pain? No  Today's Vitals   03/17/22  1408 03/17/22 1440  BP: 138/72 130/74    There is no height or weight on file to calculate BMI.   VESTIBULAR TREATMENT: GAIT: Gait pattern:  general unsteadiness, increased veering noted, decreased step length- Left, decreased stance time- Right, and wide BOS Distance walked: clinic distance with activities Assistive device utilized: None Level of assistance: SBA/CGA   NMR: Standing Balance: Surface: Airex Position: Feet  Hip Width Apart Completed with: Eyes Closed; Head Turns x 10 Reps and Head Nods x 10 Reps. Completed x 2 sets, CGA and intermittent touch A as needed.   Tandem Stance:  Surface: Airex Completed with: Eyes Open;   Time: partial tandem 2 x 30 seconds each, more challenge with RLE posterior noted   Tandem Gait on Blue Mat: completed tandem gait forwards on blue mat with intermittent UE support x 3 laps down and back. Intermittent CGA due to increased sway noted on blue mat.  Standing on Rockerboard (Anterior/Posterior): Completed static standing with eyes open x 30 seconds, then added in horizontal/vertical head turns x 10 reps. More challenge with vertical > horizontal.     Gaze Stabilization:  VOR x 1 Horizontal: narrow BOS. Completed x 60 seconds. No Symptoms but increased postural sway noted.  VOR x 1 Vertical: narrow BOS. Completed x 60 seconds. No Symptoms but increased postural sway noted.  PATIENT EDUCATION: Education details: Continue HEP Person educated: Patient Education method: Explanation Education comprehension: verbalized understanding  HEP: Standing VOR x1 x60 seconds with feet apart, [id number]     GOALS: Goals reviewed with patient? Yes   SHORT TERM GOALS: Target date: 03/07/2022   Pt will be independent with initial HEP for improved balance and vestibular input Baseline: no HEP established; HEP established reports independence Goal status:MET   2.  FGA TBA and LTG to be set as applicable Baseline: 38/33 on 02/10/22 Goal status: MET   3.  Pt will be able to hold situation 4 of M-CTSIB for >/= 15 seconds Baseline: 3 secs; 26 seconds Goal status: MET       LONG TERM GOALS: Target date: 03/28/2022   Pt will be independent with final HEP for improved balance and vestibular input  Baseline: no HEP established Goal status: INITIAL   2.  Pt will improve FGA by 4 points from baseline to demonstrate improved balance and reduced fall risk Baseline: 16/30 Goal  status: INITIAL   3.  Pt will improve DVA to </= 2 line difference to indicate improved VOR Baseline: 3 line difference Goal status: INITIAL   4.  Pt will report </= 1/5 for all movements on MSQ to indicate improvement in motion sensitivity and improved activity tolerance.  Baseline: 1-2/5 Goal status: INITIAL   5.  Pt will be able to hold situation 4 of M-CTSIB for >/= 20 seconds Baseline: 3 seconds Goal status: INITIAL       ASSESSMENT:   CLINICAL IMPRESSION: Continued gaze stabilization, with patient able to tolerate well and mild postural sway. Continued rest of session of balance activities with narrow BOS and vision removed. Planned d/c at end of POC.   OBJECTIVE IMPAIRMENTS Abnormal gait, decreased activity tolerance, decreased balance, decreased endurance, difficulty walking, decreased strength, dizziness, postural dysfunction, and pain.    ACTIVITY LIMITATIONS cleaning, community activity, yard work, and Office manager .    PERSONAL FACTORS Age, Time since onset of injury/illness/exacerbation, and 3+ comorbidities: Recent R femoral endarterectomy, Bradycardia, COPD, Emphysema, GERD, HLD, HTN, PAD, TIA  are also affecting patient's functional outcome.  REHAB POTENTIAL: Good   CLINICAL DECISION MAKING: Stable/uncomplicated   EVALUATION COMPLEXITY: Low     PLAN: PT FREQUENCY: 2x/week   PT DURATION: 6 weeks   PLANNED INTERVENTIONS: Therapeutic exercises, Therapeutic activity, Neuromuscular re-education, Balance training, Gait training, Patient/Family education, Joint manipulation, Joint mobilization, Stair training, Vestibular training, Canalith repositioning, DME instructions, Cryotherapy, Moist heat, and Manual therapy   PLAN FOR NEXT SESSION: Monitor for BPPV if symptoms return. Progress VOR x1, balance with head motions, eyes closed, narrow BOS/tandem, and SLS tasks. Tasks to simulate golf and on unlevel surfaces.     Jones Bales, PT, DPT  03/17/2022, 2:53  PM

## 2022-03-18 ENCOUNTER — Encounter: Payer: Self-pay | Admitting: Adult Health

## 2022-03-18 ENCOUNTER — Telehealth: Payer: Self-pay | Admitting: Neurology

## 2022-03-18 ENCOUNTER — Telehealth: Payer: Self-pay | Admitting: Adult Health

## 2022-03-18 ENCOUNTER — Ambulatory Visit: Payer: Medicare Other | Admitting: Adult Health

## 2022-03-18 VITALS — BP 119/73 | HR 70 | Ht 71.0 in | Wt 210.0 lb

## 2022-03-18 DIAGNOSIS — G629 Polyneuropathy, unspecified: Secondary | ICD-10-CM | POA: Diagnosis not present

## 2022-03-18 DIAGNOSIS — E538 Deficiency of other specified B group vitamins: Secondary | ICD-10-CM

## 2022-03-18 DIAGNOSIS — H539 Unspecified visual disturbance: Secondary | ICD-10-CM | POA: Diagnosis not present

## 2022-03-18 DIAGNOSIS — H02401 Unspecified ptosis of right eyelid: Secondary | ICD-10-CM | POA: Diagnosis not present

## 2022-03-18 DIAGNOSIS — I951 Orthostatic hypotension: Secondary | ICD-10-CM | POA: Diagnosis not present

## 2022-03-18 DIAGNOSIS — G459 Transient cerebral ischemic attack, unspecified: Secondary | ICD-10-CM | POA: Diagnosis not present

## 2022-03-18 DIAGNOSIS — R2689 Other abnormalities of gait and mobility: Secondary | ICD-10-CM

## 2022-03-18 NOTE — Patient Instructions (Addendum)
Your Plan:  Continue working with PT for balance impairment  You will be called by Dr. Zenia Resides office for vision concerns  Continue to follow with cardiology for hypotension concerns  Continue to follow with your PCP for B12 monitoring     Follow up in 6 months or call earlier if needed      Thank you for coming to see [place] at [facility]. I hope we have been able to provide you high quality care today.  You may receive a patient satisfaction survey over the next few weeks. We would appreciate your feedback and comments so that we may continue to improve ourselves and the health of our patients.

## 2022-03-18 NOTE — Telephone Encounter (Signed)
Pt is stuck in traffic, wants to know if he can be seen. Will be here in 10 mins. Pt did not want to reschedule wanted to continue to appt. Informed pt may have to reschedule appt

## 2022-03-18 NOTE — Progress Notes (Signed)
[facility] [address] [place]. [place] [address] [phone]       OFFICE FOLLOW-UP NOTE  Bryan Armstrong Date of Birth:  June 07, 1935 Medical Record Number:  [id number]    Chief Complaint  Patient presents with   Follow-up    RM 2 alone  Pt is well, states he believes his neuropathy has improved since starting B12.       HPI:   Update 03/18/2022 Bryan Armstrong: Patient returns for follow-up visit after prior visit with Bryan Armstrong approximately 4 months ago.  Since prior visit, reports neuropathy continues to improve since correcting B12 deficiency.  Recent B12 level satisfactory, recently monitored by PCP.  Neuropathy panel checked at prior visit which showed abnormal M protein spike and referred to hematology for further evaluation, also noted elevated rheumatoid factor but as asymptomatic, further evaluation not pursued.    He has also been having issues with imbalance/unsteadiness.  Currently working with PT with continued improvement.  He will have imbalance at times with hypotension but rare occurrence is relatively well controlled on midodrine.  Also mentions worsening imbalance with vision fluctuation.  Reports seeing objects smaller from right eye with left eye closed which is not new.  Also mentions ongoing transient right eye altitudinal visual loss (previously discussed with Bryan Armstrong) but has been improving.  He questions further evaluation with ophthalmologist.  No new concerns at this time.      History provided for reference purposes only Update 12/04/2021 Bryan Armstrong He returns for follow-up after last visit 3 months ago.  Patient states has noticed improvement in the tingling numbness in his feet after starting vitamin B-12 replacement for a month.  His vitamin B12 was found to be low at 212.  TSH and hemoglobin A1c were normal.  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  Patient was started on B12  replacement injections and now has been switched to tablets and has noticed a distinct improvement in the numbness which used to be constant every time he rested on laydown is now on intermittent and is not as bothersome.  He also is to have some mental fogging and lack of thought clarity which also seems to be improving.  He complains of occasional transient right altitudinal vision loss for 5 to 10 seconds of especially in his standing or sitting this may be related to his low blood pressure resume at baseline returns below.  Recently his blood pressure medications were reduced.  He has been started on midodrine for orthostatic hypotension.  He also has some difficulty with activities like playing golf for he feels he is slightly off balance when when he is completing the swing he is not sure if he will fall.   Update 08/26/2021 Bryan Armstrong: Returns for 67-monthstroke follow-up.  Stable from stroke standpoint without new stroke/TIA symptoms.  Compliant on aspirin and Crestor -denies side effects.  Blood pressure today 154/82.  Continues to experience bilateral toe numbness as well as imbalance (discussed at prior visit). Denies worsening since prior visit. B/l toe numbness/tingling worse at night but denies associated pain.  Has been taking B12 supplement. Continues on midodrine 2.'5mg'$  TID which has been helping BP and preventing orthostatic hypotension. Feels like legs may be weaker since prior visit. Occasional vertigo - quick lasting.  [facility] and playing [facility].  No further concerns at this time.  Update 04/16/2021 Bryan Armstrong: Mr. CRiemanreturns for 459-monthollow-up.  Stable from stroke standpoint without new stroke/TIA symptoms.  He did have 1 episode of vertigo back in April which subsided after performing Epley maneuver -no additional episodes since that time.  His greatest concern today is in regards to gradually worsening BLE numbness/tingling present at nighttime -denies associated pain.  PCP recently checked lab work (02/19/2021) with B12 212 and started on monthly injections with last injection 5/27.  He believes he may have had some benefit of symptoms after B12 injection.  Continues to follow with cardiology for symptomatic hypotension and started on midodrine on 5/27 - he reports taking approx 6-7 pills since starting using more frequently in the first 2 weeks. He does have follow up with Bryan Armstrong 7/7. BP has been more regulated where he has not been experiencing low blood pressure.  Blood pressure today 102/60.  Compliant on aspirin and Crestor without associated side effects.  No further concerns at this time.  Update 11/19/2020 Bryan Armstrong: Bryan Armstrong returns for TIA follow-up as well as concerns of dizziness/vertigo unaccompanied.  Reports episode of vertigo 1 week ago where he felt as though he was getting pulled towards the right side and was able to lower himself to his knees as he felt as though he was going to fall.  This sensation lasted for 2 to 3 minutes and then subsided.  He laid down in his bed and took a nap for approximately 20 minutes with resolution of symptoms upon awakening.  Denies any other associated symptoms such as weakness, numbness/tingling, visual changes, speech changes, facial weakness or headaches.  He was evaluated by ENT for continued vertigo (unable to view via epic) - per patient, told left inner ear issue and referred to PT for vestibular rehab.  He has been working with neuro rehab PT with improvement of symptoms.  He is able to turn his head more without provoking symptoms and symptoms typically progress with quick head movements.  He did trial meclizine x1 with increased fatigue without noticeable benefit.  Otherwise, he has been stable from a stroke standpoint.  Reports ongoing compliance with aspirin 81 mg daily and Crestor 20 mg daily and denies side effects.  Blood pressure today 127/77.  Previously having issues with hypotension therefore PCP discontinued  Maxzide and blood pressure has been slowly stabilizing.  He will occasionally have low blood pressure still where he will feel lightheaded in 1 to 2 seconds OD visual change which are typical symptoms of low blood pressure for patient.  No further concerns at this time.  Initial visit 06/19/2020 Bryan Armstrong: Mr. Kupper is a 87 year old Caucasian male seen today for initial office follow-up visit following hospital consultation for TIA in July 2021.  History is obtained from the patient, review of electronic medical records and I personally reviewed available imaging films in PACS.  He has past medical history for hyperlipidemia, hypertension, peripheral arterial disease, COPD, bradycardia with history of syncope who presented to [facility] on 04/17/2020 with sudden onset of gait instability.  He felt suddenly off balance and was leaning to the right side and slammed into a wall.  He denied any accompanying vertigo, room spinning, lightheadedness blurred vision no slurred speech or double vision.  This lasted about 5 minutes and then gradually returned back to normal.  He stated he had a previous episode 10 years ago of suddenly collapsing after getting up from the dining table at a restaurant and was felt to have had a vasovagal syncope at that time.  He does  have history of peripheral arterial disease and underwent right femoral endarterectomy on 04/03/2020 with Dr. Oneida Alar and left-sided endarterectomy is also planned.  MRI scan of the brain obtained in the hospital was negative for acute infarct and showed small changes of small vessel disease CT angiogram showed no significant extracranial intracranial large vessel stenosis or occlusion.  LDL cholesterol 57 mg percent and hemoglobin A1c was 5.7.  Transthoracic echo showed normal ejection fraction without cardiac source of embolism.  Outpatient cardiac Holter monitoring showed no significant cardiac arrhythmias.  Patient states he has had episodes of  hypotension which are manifested with blurred vision and feeling tired and fuzzy headed.  The has noticed this happen multiple times when his blood pressure has been below 852 systolic.  He does take Maxide about 2 or 3 days a week when his blood pressure is running in the 150s.  He seems to be exquisitely sensitive to this.  He plans to discuss with his primary care physician alternative blood pressure management strategies.  He admits to not drinking enough fluids.  He was scheduled to undergo dental surgery soon which she hopes will solve his problems.  He has had no recurrent stroke or TIA symptoms.  He remains on Crestor 20 mg he is tolerating well without muscle aches and pains.     ROS:   14 system review of systems is positive for those listed in HPI and all other systems negative  PMH:  Past Medical History:  Diagnosis Date   BRADYCARDIA 06/01/2007   Cataract    bil cataracts removed   COPD (chronic obstructive pulmonary disease) ([place])    EMPHYSEMA 03/28/2008   Emphysema of lung (HCC)    GERD (gastroesophageal reflux disease)    HYPERGLYCEMIA 06/01/2007   HYPERLIPIDEMIA 06/01/2007   Hypertension    HYPERTENSION 01/19/2009   Peripheral arterial disease (HCC)    TIA (transient ischemic attack)     Social History:  Social History   Socioeconomic History   Marital status: Married    Spouse name: Not on file   Number of children: Not on file   Years of education: Not on file   Highest education level: Not on file  Occupational History   Occupation: Retired   Tobacco Use   Smoking status: Former    Packs/day: 0.50    Years: 40.00    Pack years: 20.00    Types: Cigarettes    Quit date: 09/26/2017    Years since quitting: 4.4   Smokeless tobacco: Never   Tobacco comments:    stopped 2018  Vaping Use   Vaping Use: Never used  Substance and Sexual Activity   Alcohol use: No   Drug use: No   Sexual activity: Not on file  Other Topics Concern   Not on file  Social  History Narrative   Lives at home with wife   Right handed   Caffeine: drinks de-caf drinks, diet drinks if its a coke.   Social Determinants of Health   Financial Resource Strain: Not on file  Food Insecurity: Not on file  Transportation Needs: Not on file  Physical Activity: Not on file  Stress: Not on file  Social Connections: Not on file  Intimate Partner Violence: Not on file    Medications:   Current Outpatient Medications on File Prior to Visit  Medication Sig Dispense Refill   aspirin 81 MG tablet Take 81 mg by mouth daily.      celecoxib (CELEBREX) 200 MG capsule Take  1 capsule (200 mg total) by mouth 2 (two) times daily as needed. 60 capsule 5   cyanocobalamin 2000 MCG tablet Take 2,000 mcg by mouth daily.     EPINEPHrine (EPIPEN 2-PAK) 0.3 mg/0.3 mL IJ SOAJ injection Inject 0.3 mLs (0.3 mg total) into the muscle as needed for anaphylaxis. 1 each 0   fluticasone (FLONASE) 50 MCG/ACT nasal spray Place 2 sprays into both nostrils daily as needed for allergies or rhinitis.     fluticasone-salmeterol (ADVAIR) 250-50 MCG/ACT AEPB Inhale 1 puff into the lungs every 12 (twelve) hours. 60 each 11   midodrine (PROAMATINE) 5 MG tablet Take 1 tablet (5 mg total) by mouth 3 (three) times daily with meals. Take 1 tablet at 8am, 12 noon and 4pm 270 tablet 1   rosuvastatin (CRESTOR) 20 MG tablet TAKE 1 TABLET(20 MG) BY MOUTH DAILY AT 6 PM 90 tablet 0   No current facility-administered medications on file prior to visit.    Allergies:   Allergies  Allergen Reactions   Bee Venom Anaphylaxis    Yellow jackets   Meclizine     Fatigue     Physical Exam Today's Vitals   03/18/22 1437  BP: 119/73  Pulse: 70  Weight: 210 lb (95.3 kg)  Height: '5\' 11"'$  (1.803 m)   Body mass index is 29.29 kg/m.   General: well developed, well nourished pleasant elderly Caucasian male, seated, in no evident distress Head: head normocephalic and atraumatic.  Neck: supple with no carotid or  supraclavicular bruits Cardiovascular: regular rate and rhythm, no murmurs Musculoskeletal: no deformity Skin:  no rash/petichiae Vascular:  Normal pulses all extremities  Neurologic Exam Mental Status: Awake and fully alert.  Fluent speech and language.  Oriented to place and time. Recent and remote memory intact. Attention span, concentration and fund of knowledge appropriate. Mood and affect appropriate.  Cranial Nerves: Pupils equal, briskly reactive to light. Right upper eyelid ptosis noted towards end of visit. Extraocular movements full without nystagmus. Visual fields full to confrontation. Hearing slightly diminished bilaterally. Facial sensation intact. Face, tongue, palate moves normally and symmetrically.  Motor: Normal bulk and tone. Normal strength in all tested extremity muscles except mild left ADF weakness Sensory.: intact to touch ,pinprick .position and vibratory sensation.  Coordination: Rapid alternating movements normal in all extremities. Finger-to-nose and heel-to-shin performed accurately bilaterally. Gait and Station: Arises from chair without difficulty. Stance is normal. Gait demonstrates broad-based gait with slightly decreased stride length and unsteadiness without use of assistive device.  Tandem walk and heel toe not attempted Reflexes: 1+ and symmetric. Toes downgoing.       ASSESSMENT/PLAN: 87 year old male with posterior circulation TIA in July 2021 from small vessel disease.  Vascular risk factors of hypertension hyperlipidemia, peripheral arterial disease, BPPV and age.  Longstanding history of BPPV which has been stable.  New complaint of gradual worsening bilateral toe numbness/tingling in setting of neuropathy possibly from B12 deficiency and gait impairment/imbalance    TIA : Continue aspirin 81 mg daily  and Crestor for secondary stroke prevention.  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  Did have significant B12 deficiency now corrected which may be contributing.  Notes continued improvement since correction of B12 deficiency.  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  Continue  to work with PT as this has been providing benefit Hypotension w/ hx of HTN: Continue midodrine 5 mg daily for symptomatic hypotension managed by cardiology.    Follow-up in 6 months or call earlier if needed    CC:  Vivi Barrack, MD   I spent 34 minutes of face-to-face and non-face-to-face time with patient.  This included previsit chart review, lab review, study review, order entry, electronic health record documentation, patient education and discussion regarding hx of TIA and secondary stroke prevention measures and importance of aggressive stroke risk factor management, hx of BPPV, severe neuropathy, gait impairment, and answered all other questions to patients satisfaction  Frann Rider, AGNP-BC  [facility] [address] [place], [place] [address]  Phone [phone] Fax [phone] Note: This document was prepared with digital dictation and possible smart phrase technology. Any transcriptional errors that result from this process are unintentional.

## 2022-03-18 NOTE — Telephone Encounter (Signed)
Referral for Ophthalmology sent to [facility] [phone].

## 2022-03-19 ENCOUNTER — Ambulatory Visit: Payer: Medicare Other

## 2022-03-19 VITALS — BP 106/56 | HR 69

## 2022-03-19 DIAGNOSIS — R42 Dizziness and giddiness: Secondary | ICD-10-CM | POA: Diagnosis not present

## 2022-03-19 DIAGNOSIS — R2681 Unsteadiness on feet: Secondary | ICD-10-CM | POA: Diagnosis not present

## 2022-03-19 DIAGNOSIS — R262 Difficulty in walking, not elsewhere classified: Secondary | ICD-10-CM

## 2022-03-19 DIAGNOSIS — M6281 Muscle weakness (generalized): Secondary | ICD-10-CM | POA: Diagnosis not present

## 2022-03-19 NOTE — Patient Instructions (Signed)
Gaze Stabilization: Standing Feet Together    Feet together, keeping eyes on target on wall 3-4 feet away, tilt head down 15-30 and move head side to side for 60 seconds. Repeat while moving head up and down for 60 seconds. Do 2-3 sessions per day.

## 2022-03-19 NOTE — Therapy (Signed)
OUTPATIENT PHYSICAL THERAPY VESTIBULAR TREATMENT NOTE   Patient Name: Bryan Armstrong MRN: [id number] DOB:1935-09-20, 87 y.o., male Today's Date: 03/19/2022  PCP: Vivi Barrack, MD REFERRING PROVIDER: Vivi Barrack, MD   PT End of Session - 03/19/22 1359     Visit Number 12    Number of Visits 13    Date for PT Re-Evaluation 03/28/22    Authorization Type U[employer] Medicare    Progress Note Due on Visit 10    PT Start Time 1400    PT Stop Time 1443    PT Time Calculation (min) 43 min    Activity Tolerance Patient tolerated treatment well    Behavior During Therapy [facility] for tasks assessed/performed               Past Medical History:  Diagnosis Date   BRADYCARDIA 06/01/2007   Cataract    bil cataracts removed   COPD (chronic obstructive pulmonary disease) ([place])    EMPHYSEMA 03/28/2008   Emphysema of lung ([place])    GERD (gastroesophageal reflux disease)    HYPERGLYCEMIA 06/01/2007   HYPERLIPIDEMIA 06/01/2007   Hypertension    HYPERTENSION 01/19/2009   Peripheral arterial disease ([place])    TIA (transient ischemic attack)    Past Surgical History:  Procedure Laterality Date   ABDOMINAL AORTOGRAM W/LOWER EXTREMITY N/A 03/05/2020   Procedure: ABDOMINAL AORTOGRAM W/  LOWER EXTREMITY;  Surgeon: Lorretta Harp, MD;  Location: [place] [facility];  Service: Cardiovascular;  Laterality: N/A;   CATARACT EXTRACTION, BILATERAL     COLONOSCOPY     ENDARTERECTOMY FEMORAL Right 04/03/2020   Procedure: Right Femoral Endarterectomy;  Surgeon: Elam Dutch, MD;  Location: [facility];  Service: Vascular;  Laterality: Right;   ENDARTERECTOMY FEMORAL Left 09/10/2020   Procedure: ENDARTERECTOMY FEMORAL LEFT;  Surgeon: Elam Dutch, MD;  Location: [facility] OR;  Service: Vascular;  Laterality: Left;   EYE SURGERY     Bilaterl cataracts   PATCH ANGIOPLASTY Left 09/10/2020   Procedure: PATCH ANGIOPLASTY OF LEFT FEMORAL ARTERY USING HEMASHIELD PLATINUM FINESSE PATCH;  Surgeon: Elam Dutch,  MD;  Location: [place];  Service: Vascular;  Laterality: Left;   POLYPECTOMY     TONSILLECTOMY AND ADENOIDECTOMY     Patient Active Problem List   Diagnosis Date Noted   Osteoarthritis 02/24/2022   Vitamin B 12 deficiency 10/03/2021   Neuropathy 02/19/2021   Abnormal CXR 02/13/2021   BPV (benign positional vertigo) 02/13/2021   Hypotension 06/19/2020   TIA (transient ischemic attack) 04/18/2020   PAD (peripheral artery disease) ([place]) 04/03/2020   Neck pain 12/30/2018   GERD (gastroesophageal reflux disease) 09/28/2018   Peripheral arterial disease ([place]) 06/15/2018   Former smoker 06/15/2018   History of colonic polyps 01/07/2018   Allergic rhinitis 05/31/2013   Urticaria 11/02/2011   COPD, group A, by GOLD 2017 classification ([place]) 03/28/2008   Dyslipidemia 06/01/2007   Bradycardia 06/01/2007   HYPERGLYCEMIA 06/01/2007    ONSET DATE: 02/03/2022  REFERRING DIAG: H81.10 (ICD-10-CM) - Benign paroxysmal positional vertigo, unspecified laterality  THERAPY DIAG:  Unsteadiness on feet  Muscle weakness (generalized)  Dizziness and giddiness  Difficulty in walking, not elsewhere classified  PERTINENT HISTORY: Recent R femoral endarterectomy, Bradycardia, COPD, Emphysema, GERD, HLD, HTN, PAD, TIA, Orthostatic Hypotension  PRECAUTIONS: Other: Orthostatics  SUBJECTIVE: Patient reports appt with [facility] went well, no new changes to medications or issues. Did get referral to opthalmology. Patient has not got appt scheduled yet. No other changes.   PAIN:  Are you having pain? No  Today's Vitals   03/19/22 1403 03/19/22 1437  BP: 113/63 (!) 106/56  Pulse: 63 69    There is no height or weight on file to calculate BMI.   VESTIBULAR TREATMENT:  GAIT: Gait pattern:  general unsteadiness, increased veering noted, decreased step length- Left, decreased stance time- Right, and wide BOS Distance walked: clinic distance with activities Assistive device utilized: None Level of  assistance: SBA   NMR: Completed entire review of current HEP and updated to patient's current progress:   Access Code: [email or web address] URL: [email or web address] Date: 03/19/2022 Prepared by: Jethro Bastos  Exercises - Romberg Stance Eyes Closed on Foam Pad  - 1 x daily - 5 x weekly - 1 sets - 3 reps - 30 hold - Romberg Stance with Head Nods on Foam Pad  - 1 x daily - 5 x weekly - 2 sets - 10 reps - Romberg Stance on Foam Pad with Head Rotation  - 1 x daily - 5 x weekly - 2 sets - 10 reps - Standing Romberg to 3/4 Tandem Stance  - 1 x daily - 5 x weekly - 1 sets - 4 reps - 30 hold - Tandem Walking with Counter Support  - 1 x daily - 5 x weekly - 1 sets - 3 reps - Walking with Head Rotation  - 1 x daily - 5 x weekly - 1 sets - 3 reps     Gaze Stabilization:  VOR x 1 Horizontal: narrow BOS on firm surface. Completed x 60 seconds. x 2 Reps. No Symptoms but increased postural sway noted.  VOR x 1 Vertical: narrow BOS on firm surface. Completed x 60 seconds. x 2 Reps. No Symptoms but increased postural sway noted.  Educated to continue narrow BOS with Gaze Stabilization. See Below for Details.   Gaze Stabilization: Standing Feet Together    Feet together, keeping eyes on target on wall 3-4 feet away, tilt head down 15-30 and move head side to side for 60 seconds. Repeat while moving head up and down for 60 seconds. Do 2-3 sessions per day.  PATIENT EDUCATION: Education details: Updated HEP Person educated: Patient Education method: Explanation Education comprehension: verbalized understanding    HEP: Standing VOR x1 x60 seconds with feet apart, [id number]     GOALS: Goals reviewed with patient? Yes   SHORT TERM GOALS: Target date: 03/07/2022   Pt will be independent with initial HEP for improved balance and vestibular input Baseline: no HEP established; HEP established reports independence Goal status:MET   2.  FGA TBA and LTG to be set as  applicable Baseline: 16/30 on 02/10/22 Goal status: MET   3.  Pt will be able to hold situation 4 of M-CTSIB for >/= 15 seconds Baseline: 3 secs; 26 seconds Goal status: MET       LONG TERM GOALS: Target date: 03/28/2022   Pt will be independent with final HEP for improved balance and vestibular input  Baseline: no HEP established Goal status: INITIAL   2.  Pt will improve FGA by 4 points from baseline to demonstrate improved balance and reduced fall risk Baseline: 16/30 Goal status: INITIAL   3.  Pt will improve DVA to </= 2 line difference to indicate improved VOR Baseline: 3 line difference Goal status: INITIAL   4.  Pt will report </= 1/5 for all movements on MSQ to indicate improvement in motion sensitivity and improved activity tolerance.  Baseline: 1-2/5 Goal status: INITIAL  5.  Pt will be able to hold situation 4 of M-CTSIB for >/= 20 seconds Baseline: 3 seconds Goal status: INITIAL       ASSESSMENT:   CLINICAL IMPRESSION: Today's session focused on review and updating current HEP to patient's progress. Patient will continue gaze stabilization with narrow BOS due to postural sway noted. With balance HEP added in tandem stance and gait with head turns due to most challenge with these activities. Will assess progress toward LTGs with planned d/c at next visit.   OBJECTIVE IMPAIRMENTS Abnormal gait, decreased activity tolerance, decreased balance, decreased endurance, difficulty walking, decreased strength, dizziness, postural dysfunction, and pain.    ACTIVITY LIMITATIONS cleaning, community activity, yard work, and Office manager .    PERSONAL FACTORS Age, Time since onset of injury/illness/exacerbation, and 3+ comorbidities: Recent R femoral endarterectomy, Bradycardia, COPD, Emphysema, GERD, HLD, HTN, PAD, TIA  are also affecting patient's functional outcome.      REHAB POTENTIAL: Good   CLINICAL DECISION MAKING: Stable/uncomplicated   EVALUATION COMPLEXITY: Low      PLAN: PT FREQUENCY: 2x/week   PT DURATION: 6 weeks   PLANNED INTERVENTIONS: Therapeutic exercises, Therapeutic activity, Neuromuscular re-education, Balance training, Gait training, Patient/Family education, Joint manipulation, Joint mobilization, Stair training, Vestibular training, Canalith repositioning, DME instructions, Cryotherapy, Moist heat, and Manual therapy   PLAN FOR NEXT SESSION: Monitor for BPPV if symptoms return. Progress VOR x1, balance with head motions, eyes closed, narrow BOS/tandem, and SLS tasks. Tasks to simulate golf and on unlevel surfaces. Check goals + d/c.     Jones Bales, PT, DPT  03/19/2022, 2:43 PM

## 2022-03-22 ENCOUNTER — Other Ambulatory Visit: Payer: Self-pay | Admitting: Vascular Surgery

## 2022-03-24 ENCOUNTER — Ambulatory Visit: Payer: Medicare Other

## 2022-03-24 VITALS — BP 168/93 | HR 69

## 2022-03-24 DIAGNOSIS — R2681 Unsteadiness on feet: Secondary | ICD-10-CM | POA: Diagnosis not present

## 2022-03-24 DIAGNOSIS — R262 Difficulty in walking, not elsewhere classified: Secondary | ICD-10-CM | POA: Diagnosis not present

## 2022-03-24 DIAGNOSIS — R42 Dizziness and giddiness: Secondary | ICD-10-CM | POA: Diagnosis not present

## 2022-03-24 DIAGNOSIS — M6281 Muscle weakness (generalized): Secondary | ICD-10-CM

## 2022-03-24 NOTE — Therapy (Signed)
OUTPATIENT PHYSICAL THERAPY VESTIBULAR TREATMENT NOTE/DISCHARGE SUMMARY   Patient Name: Bryan Armstrong MRN: [id number] DOB:05/05/1935, 87 y.o., male Today's Date: 03/24/2022  PCP: Vivi Barrack, MD REFERRING PROVIDER: Vivi Barrack, MD  PHYSICAL THERAPY DISCHARGE SUMMARY  Visits from Start of Care: 13  Current functional level related to goals / functional outcomes: See Clinical Impression Statement   Remaining deficits: Low Fall Risk; Mild Imbalance and Lightheadedness   Education / Equipment: HEP provided   Patient agrees to discharge. Patient goals were met. Patient is being discharged due to meeting the stated rehab goals.    PT End of Session - 03/24/22 1401     Visit Number 13    Number of Visits 13    Date for PT Re-Evaluation 03/28/22    Authorization Type UHC Medicare    Progress Note Due on Visit 10    PT Start Time 1401    PT Stop Time 1434    PT Time Calculation (min) 33 min    Activity Tolerance Patient tolerated treatment well    Behavior During Therapy [facility] for tasks assessed/performed              Past Medical History:  Diagnosis Date   BRADYCARDIA 06/01/2007   Cataract    bil cataracts removed   COPD (chronic obstructive pulmonary disease) ([place])    EMPHYSEMA 03/28/2008   Emphysema of lung ([place])    GERD (gastroesophageal reflux disease)    HYPERGLYCEMIA 06/01/2007   HYPERLIPIDEMIA 06/01/2007   Hypertension    HYPERTENSION 01/19/2009   Peripheral arterial disease ([place])    TIA (transient ischemic attack)    Past Surgical History:  Procedure Laterality Date   ABDOMINAL AORTOGRAM W/LOWER EXTREMITY N/A 03/05/2020   Procedure: ABDOMINAL AORTOGRAM W/  LOWER EXTREMITY;  Surgeon: Lorretta Harp, MD;  Location: [place] [facility];  Service: Cardiovascular;  Laterality: N/A;   CATARACT EXTRACTION, BILATERAL     COLONOSCOPY     ENDARTERECTOMY FEMORAL Right 04/03/2020   Procedure: Right Femoral Endarterectomy;  Surgeon: Elam Dutch, MD;   Location: [facility] OR;  Service: Vascular;  Laterality: Right;   ENDARTERECTOMY FEMORAL Left 09/10/2020   Procedure: ENDARTERECTOMY FEMORAL LEFT;  Surgeon: Elam Dutch, MD;  Location: [facility] OR;  Service: Vascular;  Laterality: Left;   EYE SURGERY     Bilaterl cataracts   PATCH ANGIOPLASTY Left 09/10/2020   Procedure: PATCH ANGIOPLASTY OF LEFT FEMORAL ARTERY USING HEMASHIELD PLATINUM FINESSE PATCH;  Surgeon: Elam Dutch, MD;  Location: [place];  Service: Vascular;  Laterality: Left;   POLYPECTOMY     TONSILLECTOMY AND ADENOIDECTOMY     Patient Active Problem List   Diagnosis Date Noted   Osteoarthritis 02/24/2022   Vitamin B 12 deficiency 10/03/2021   Neuropathy 02/19/2021   Abnormal CXR 02/13/2021   BPV (benign positional vertigo) 02/13/2021   Hypotension 06/19/2020   TIA (transient ischemic attack) 04/18/2020   PAD (peripheral artery disease) ([place]) 04/03/2020   Neck pain 12/30/2018   GERD (gastroesophageal reflux disease) 09/28/2018   Peripheral arterial disease ([place]) 06/15/2018   Former smoker 06/15/2018   History of colonic polyps 01/07/2018   Allergic rhinitis 05/31/2013   Urticaria 11/02/2011   COPD, group A, by GOLD 2017 classification ([place]) 03/28/2008   Dyslipidemia 06/01/2007   Bradycardia 06/01/2007   HYPERGLYCEMIA 06/01/2007    ONSET DATE: 02/03/2022  REFERRING DIAG: H81.10 (ICD-10-CM) - Benign paroxysmal positional vertigo, unspecified laterality  THERAPY DIAG:  Unsteadiness on feet  Muscle weakness (generalized)  Dizziness and giddiness  Difficulty in walking, not elsewhere classified  PERTINENT HISTORY: Recent R femoral endarterectomy, Bradycardia, COPD, Emphysema, GERD, HLD, HTN, PAD, TIA, Orthostatic Hypotension  PRECAUTIONS: Other: Orthostatics  SUBJECTIVE: Patient reports no new changes, feeling lightheaded this afternoon.  PAIN:  Are you having pain? No  Today's Vitals   03/24/22 1409  BP: (!) 168/93  Pulse: 69   There is no height or  weight on file to calculate BMI.   VESTIBULAR TREATMENT: Dynamic Visual Acuity:  Static: Line 9  Dynamic: Line 8    Motion Sensitivity Quotient Intensity: 0 = none, 1 = Lightheaded, 2 = Mild, 3 = Moderate, 4 = Severe, 5 = Vomiting  Intensity  1. Sitting to supine 0  2. Supine to L side 0  3. Supine to R side 0  4. Supine to sitting 1  5. L Hallpike-Dix 0  6. Up from L  1  7. R Hallpike-Dix 0  8. Up from R  1  9. Sitting, head  tipped to L knee 0  10. Head up from L  knee 1  11. Sitting, head  tipped to R knee 0  12. Head up from R  knee 1  13. Sitting head turns x5 0  14.Sitting head nods x5 0  15. In stance, 180  turn to L  0  16. In stance, 180  turn to R 0    GAIT: Gait pattern:  general unsteadiness, increased veering noted, decreased step length- Left, decreased stance time- Right, and wide BOS Distance walked: clinic distance with activities Assistive device utilized: None Level of assistance: SBA    [facility] PT Assessment - 03/24/22 0001       Functional Gait  Assessment   Gait assessed  Yes    Gait Level Surface Walks 20 ft in less than 7 sec but greater than 5.5 sec, uses assistive device, slower speed, mild gait deviations, or deviates 6-10 in outside of the 12 in walkway width.    Change in Gait Speed Able to smoothly change walking speed without loss of balance or gait deviation. Deviate no more than 6 in outside of the 12 in walkway width.    Gait with Horizontal Head Turns Performs head turns smoothly with no change in gait. Deviates no more than 6 in outside 12 in walkway width    Gait with Vertical Head Turns Performs task with slight change in gait velocity (eg, minor disruption to smooth gait path), deviates 6 - 10 in outside 12 in walkway width or uses assistive device    Gait and Pivot Turn Pivot turns safely within 3 sec and stops quickly with no loss of balance.    Step Over Obstacle Is able to step over one shoe box (4.5 in total height)  without changing gait speed. No evidence of imbalance.    Gait with Narrow Base of Support Ambulates 4-7 steps.    Gait with Eyes Closed Walks 20 ft, slow speed, abnormal gait pattern, evidence for imbalance, deviates 10-15 in outside 12 in walkway width. Requires more than 9 sec to ambulate 20 ft.    Ambulating Backwards Walks 20 ft, uses assistive device, slower speed, mild gait deviations, deviates 6-10 in outside 12 in walkway width.    Steps Alternating feet, must use rail.    Total Score 21    FGA comment: 20/30              PATIENT EDUCATION: Education details: Progress toward LTGs  Person educated: Patient Education method: Explanation Education comprehension: verbalized understanding    HEP: Standing VOR x1 x60 seconds with feet apart, [id number]     GOALS: Goals reviewed with patient? Yes   SHORT TERM GOALS: Target date: 03/07/2022   Pt will be independent with initial HEP for improved balance and vestibular input Baseline: no HEP established; HEP established reports independence Goal status:MET   2.  FGA TBA and LTG to be set as applicable Baseline: 96/22 on 02/10/22 Goal status: MET   3.  Pt will be able to hold situation 4 of M-CTSIB for >/= 15 seconds Baseline: 3 secs; 26 seconds Goal status: MET       LONG TERM GOALS: Target date: 03/28/2022   Pt will be independent with final HEP for improved balance and vestibular input  Baseline: no HEP established; reports independence with updated/progressive HEP Goal status: IMET   2.  Pt will improve FGA by 4 points from baseline to demonstrate improved balance and reduced fall risk Baseline: 16/30; 21/30 Goal status: MET   3.  Pt will improve DVA to </= 2 line difference to indicate improved VOR Baseline: 3 line difference; 1 Line Difference Goal status: MET   4.  Pt will report </= 1/5 for all movements on MSQ to indicate improvement in motion sensitivity and improved activity tolerance.  Baseline: 1-2/5;  0-1/5 for all components of MSQ Goal status: MET   5.  Pt will be able to hold situation 4 of M-CTSIB for >/= 20 seconds Baseline: 3 seconds; 26 seconds Goal status: MET       ASSESSMENT:   CLINICAL IMPRESSION: Completed assessment of patient's progress toward LTGs. Patient able to meet all LTG demonstrating improved balance and low fall risk, and improved dizziness. Patient also had 1 line difference on DVA indicating improved VOR. Patient demo readiness to d/c from PT services at this time, with patient agreeable. Patient has demonstrated significant progress with PT services. No questions/concerns about finalized HEP.   OBJECTIVE IMPAIRMENTS Abnormal gait, decreased activity tolerance, decreased balance, decreased endurance, difficulty walking, decreased strength, dizziness, postural dysfunction, and pain.    ACTIVITY LIMITATIONS cleaning, community activity, yard work, and Office manager .    PERSONAL FACTORS Age, Time since onset of injury/illness/exacerbation, and 3+ comorbidities: Recent R femoral endarterectomy, Bradycardia, COPD, Emphysema, GERD, HLD, HTN, PAD, TIA  are also affecting patient's functional outcome.      REHAB POTENTIAL: Good   CLINICAL DECISION MAKING: Stable/uncomplicated   EVALUATION COMPLEXITY: Low     PLAN: PT FREQUENCY: 2x/week   PT DURATION: 6 weeks   PLANNED INTERVENTIONS: Therapeutic exercises, Therapeutic activity, Neuromuscular re-education, Balance training, Gait training, Patient/Family education, Joint manipulation, Joint mobilization, Stair training, Vestibular training, Canalith repositioning, DME instructions, Cryotherapy, Moist heat, and Manual therapy   PLAN FOR NEXT SESSION: d/c this visit    Jones Bales, PT, DPT  03/24/2022, 2:34 PM

## 2022-03-28 ENCOUNTER — Ambulatory Visit: Payer: Medicare Other

## 2022-04-02 DIAGNOSIS — G459 Transient cerebral ischemic attack, unspecified: Secondary | ICD-10-CM | POA: Diagnosis not present

## 2022-04-02 DIAGNOSIS — H53123 Transient visual loss, bilateral: Secondary | ICD-10-CM | POA: Diagnosis not present

## 2022-04-02 DIAGNOSIS — H02831 Dermatochalasis of right upper eyelid: Secondary | ICD-10-CM | POA: Diagnosis not present

## 2022-04-02 DIAGNOSIS — H35372 Puckering of macula, left eye: Secondary | ICD-10-CM | POA: Diagnosis not present

## 2022-04-02 DIAGNOSIS — Z961 Presence of intraocular lens: Secondary | ICD-10-CM | POA: Diagnosis not present

## 2022-04-02 DIAGNOSIS — H532 Diplopia: Secondary | ICD-10-CM | POA: Diagnosis not present

## 2022-04-02 DIAGNOSIS — H02834 Dermatochalasis of left upper eyelid: Secondary | ICD-10-CM | POA: Diagnosis not present

## 2022-04-14 DIAGNOSIS — M545 Low back pain, unspecified: Secondary | ICD-10-CM | POA: Diagnosis not present

## 2022-04-28 ENCOUNTER — Telehealth: Payer: Self-pay | Admitting: Cardiovascular Disease

## 2022-04-28 NOTE — Telephone Encounter (Signed)
Spoke with the patient.    He reports feeling "sluggish in the brain, from vision up and has a feeling in the head, not a headache" --which has been going on for several weeks.  If he moves a little too quickly he gets dizzy.  He knows to be careful.  He uses a cart at the store, doesn't skip meals, feels he stays hydrated.  Just isn't a clear head.  Not falling down dizzy.  Yesterday in the church parking lot - he feels it was close to him going down.  He recovers well after he sits down about 15 min.   He is taking midodrine usually one at breakfast, half at lunch and whole at 4pm.  When took whole tab TID the BP was elevated to 160s.  I asked him to drink more fluid.  His last EF on echo was normal.  Unable to get compression hose on. We talked about an abdominal binder.  He is going to take his first AM dose before he gets out of bed.  He said he is going to play with the mid day dose.  May take '5mg'$  at 8 and noon and 2.5 mg as last dose of day.  He would like any input from Dr. Caryl Comes.     blood pressures this morning 98/58 85/56 102/68 106/70 135/77

## 2022-04-28 NOTE — Telephone Encounter (Signed)
Patient called back to follow-up on getting a response to this encounter to see if he needs to change his medication.

## 2022-04-28 NOTE — Telephone Encounter (Signed)
There are probably a couple of things that could be done; this could be hard to do these without him getting evaluated in the office with orthostatics. We can start by having him try to raise the head of his bed 4 inches.  I do not use compression socks; we do use thigh sleeves which can be used with Velcro bands which are much easier to get on and probably more effective.  An abdominal binder can also be effective.  The most important blood pressure that we are concerned about is his blood pressure when he falls, so if his blood pressure 160 during the day and he took his last dose of ProAmatine more than 6 hours before bedtime, and he was less dizzy I would be fine with that

## 2022-04-28 NOTE — Telephone Encounter (Signed)
Pt c/o BP issue: STAT if pt c/o blurred vision, one-sided weakness or slurred speech  1. What are your last 5 BP readings?  He did not have any readings but stated he thinks it is really low.   2. Are you having any other symptoms (ex. Dizziness, headache, blurred vision, passed out)? Yesterday he felt Dizziness and like he was going to fiant.    3. What is your BP issue? He was going to church yesterday and was walking across this parking lot and he felt like he was dizziness and he was going to pass out.  He stated he normally only has those episodes right breakfast in the morning but no issues in the afternoon.   He stated he is feeling fine this morning but would like to speak with nurse.    Best number 3 [phone]

## 2022-05-02 NOTE — Telephone Encounter (Signed)
Spoke with pt who reports current BP 102/56.  Pt states he attempted to hit golf balls at the driving range today but was unable to do this because of dizziness.  He is taking Midodrine as prescribed without relief most of the time.  He states BP will occassionally get as high as 779'T systolic.  Pt advised when he takes his BP and sees that it is low he may have a salty snack to help raise his BP.  He states he is well hydrated with water and drinks diet coke at times. He asks if there is another medication other than Midodrine that might work better.   Pt advised of Dr Olin Pia recommendations to start by having him try to raise the head of his bed 4 inches. Do not use compression socks; but try thigh sleeves which can be used with Velcro bands which are much easier to get on and probably more effective.  An abdominal binder can also be effective.  Pt verbalizes understanding and agrees with current plan.  Will forward to Dr Caryl Comes for further recommendation re: medication.

## 2022-05-05 ENCOUNTER — Other Ambulatory Visit: Payer: Self-pay

## 2022-05-05 ENCOUNTER — Encounter (HOSPITAL_BASED_OUTPATIENT_CLINIC_OR_DEPARTMENT_OTHER): Payer: Self-pay

## 2022-05-05 ENCOUNTER — Emergency Department (HOSPITAL_BASED_OUTPATIENT_CLINIC_OR_DEPARTMENT_OTHER): Payer: Medicare Other

## 2022-05-05 ENCOUNTER — Emergency Department (HOSPITAL_COMMUNITY): Payer: Medicare Other

## 2022-05-05 ENCOUNTER — Emergency Department (HOSPITAL_BASED_OUTPATIENT_CLINIC_OR_DEPARTMENT_OTHER)
Admission: EM | Admit: 2022-05-05 | Discharge: 2022-05-06 | Disposition: A | Discharge status: Home / Self Care | Payer: Medicare Other | Attending: Emergency Medicine | Admitting: Emergency Medicine

## 2022-05-05 DIAGNOSIS — Z7982 Long term (current) use of aspirin: Secondary | ICD-10-CM | POA: Diagnosis not present

## 2022-05-05 DIAGNOSIS — R202 Paresthesia of skin: Secondary | ICD-10-CM | POA: Diagnosis not present

## 2022-05-05 DIAGNOSIS — G319 Degenerative disease of nervous system, unspecified: Secondary | ICD-10-CM | POA: Diagnosis not present

## 2022-05-05 DIAGNOSIS — R209 Unspecified disturbances of skin sensation: Secondary | ICD-10-CM | POA: Diagnosis present

## 2022-05-05 DIAGNOSIS — R7309 Other abnormal glucose: Secondary | ICD-10-CM | POA: Diagnosis not present

## 2022-05-05 DIAGNOSIS — Z79899 Other long term (current) drug therapy: Secondary | ICD-10-CM | POA: Insufficient documentation

## 2022-05-05 DIAGNOSIS — R200 Anesthesia of skin: Secondary | ICD-10-CM | POA: Diagnosis not present

## 2022-05-05 DIAGNOSIS — R29810 Facial weakness: Secondary | ICD-10-CM

## 2022-05-05 DIAGNOSIS — I639 Cerebral infarction, unspecified: Secondary | ICD-10-CM | POA: Diagnosis not present

## 2022-05-05 DIAGNOSIS — I10 Essential (primary) hypertension: Secondary | ICD-10-CM | POA: Diagnosis not present

## 2022-05-05 LAB — PROTIME-INR
INR: 1 (ref 0.8–1.2)
Prothrombin Time: 13.3 seconds (ref 11.4–15.2)

## 2022-05-05 LAB — APTT: aPTT: 29 seconds (ref 24–36)

## 2022-05-05 LAB — DIFFERENTIAL
Abs Immature Granulocytes: 0.02 10*3/uL (ref 0.00–0.07)
Basophils Absolute: 0.1 10*3/uL (ref 0.0–0.1)
Basophils Relative: 1 %
Eosinophils Absolute: 0.2 10*3/uL (ref 0.0–0.5)
Eosinophils Relative: 2 %
Immature Granulocytes: 0 %
Lymphocytes Relative: 21 %
Lymphs Abs: 1.9 10*3/uL (ref 0.7–4.0)
Monocytes Absolute: 0.5 10*3/uL (ref 0.1–1.0)
Monocytes Relative: 6 %
Neutro Abs: 6.6 10*3/uL (ref 1.7–7.7)
Neutrophils Relative %: 70 %

## 2022-05-05 LAB — COMPREHENSIVE METABOLIC PANEL
ALT: 22 U/L (ref 0–44)
AST: 17 U/L (ref 15–41)
Albumin: 4.2 g/dL (ref 3.5–5.0)
Alkaline Phosphatase: 42 U/L (ref 38–126)
Anion gap: 8 (ref 5–15)
BUN: 23 mg/dL (ref 8–23)
CO2: 28 mmol/L (ref 22–32)
Calcium: 9.3 mg/dL (ref 8.9–10.3)
Chloride: 105 mmol/L (ref 98–111)
Creatinine, Ser: 1.18 mg/dL (ref 0.61–1.24)
GFR, Estimated: >60 mL/min (ref >60)
Glucose, Bld: 87 mg/dL (ref 70–99)
Potassium: 4 mmol/L (ref 3.5–5.1)
Sodium: 141 mmol/L (ref 135–145)
Total Bilirubin: 1 mg/dL (ref 0.3–1.2)
Total Protein: 6.7 g/dL (ref 6.5–8.1)

## 2022-05-05 LAB — CBC
HCT: 45.3 % (ref 39.0–52.0)
Hemoglobin: 14.8 g/dL (ref 13.0–17.0)
MCH: 29.5 pg (ref 26.0–34.0)
MCHC: 32.7 g/dL (ref 30.0–36.0)
MCV: 90.4 fL (ref 80.0–100.0)
Platelets: 206 10*3/uL (ref 150–400)
RBC: 5.01 MIL/uL (ref 4.22–5.81)
RDW: 13.9 % (ref 11.5–15.5)
WBC: 9.3 10*3/uL (ref 4.0–10.5)
nRBC: 0 % (ref 0.0–0.2)

## 2022-05-05 LAB — ETHANOL: Alcohol, Ethyl (B): <10 mg/dL (ref <10)

## 2022-05-05 LAB — CBG MONITORING, ED: Glucose-Capillary: 118 mg/dL — ABNORMAL HIGH (ref 70–99)

## 2022-05-05 NOTE — ED Notes (Signed)
EDP Consulted regarding Patient Symptoms to determine Current Plan of Care. Code Stroke not activated and Standing Orders Placed accordingly.

## 2022-05-05 NOTE — ED Triage Notes (Signed)
Patient here POV from Home.  Endorses Bilateral Digit Numbness that the Patient noted this AM at 0900. Upon noting this the Patient noted in the Mirror Drooping to the Upper Right Eyelid. Drooping has since continued.  No Symptoms upon awakening this AM at 0830. Chronic Lower Extremity Numbness due to Neuropathy. Numbness to Digits has since Subsided. No Pain. No SOB. BEFAST Exam noted to be Negative in Triage.   NAD Noted during Triage. A&Ox4. GCS 15. Ambulatory.

## 2022-05-05 NOTE — ED Provider Notes (Signed)
[facility] EMERGENCY DEPT Provider Note   CSN: [id number] Arrival date & time: 05/05/22  1327     History  Chief Complaint  Patient presents with   Numbness    Bryan Armstrong is a 87 y.o. male.  Pt presents with episode of facial drooping and tingling to fingers.  He has a history of hypertension, hyperlipidemia, peripheral arterial disease status post bilateral femoral endarterectomies.  He said he woke up this morning and noticed some drooping to his right eyelid.  He also felt that his fingers were tingly.  He said they were in both of his hands.  He describes it more of his pins and needle sensation then rather than absolute numbness.  He also has some chronic neuropathy in his feet but says this is unchanged from his baseline.  He does not report any weakness in his arms or legs.  No speech deficits.  He does feel that his vision was somewhat altered although he cannot completely explain it.  He says that his right eye looked different than his left eye.  He was seeing 2 different things although he does not describe an actual double vision.  No headaches.  He feels that his coordination is off today more than it normally is.  He has had some recent issues with a diagnosis of a TIA in 2021 after he had some balance issues.  He said over the last 6 months to a year he has had some difficulty with his blood pressure and then his blood pressure is too low.  He was started on some medication by his cardiologist to increase his blood pressure.  He says normally it will run around 100 and sometimes gets down into the 80s.  He has had to basically stop playing golf because when he is playing golf, I he does not have the coordination to step from 1 foot to the other when he is swinging.  He gets lightheaded on the golf course and also has trouble with his vision seeing the golf ball.  He does say that at times in his right eye he will lose the vision to the top half of the eye  completely.  This will last about 10 to 15 minutes.  He has followed up with an ophthalmologist and they were not able to find any concerns on their exam.         Home Medications Prior to Admission medications   Medication Sig Start Date End Date Taking? Authorizing Provider  aspirin 81 MG tablet Take 81 mg by mouth daily.     [provider]  celecoxib (CELEBREX) 200 MG capsule Take 1 capsule (200 mg total) by mouth 2 (two) times daily as needed. 02/24/22   Vivi Barrack, MD  cyanocobalamin 2000 MCG tablet Take 2,000 mcg by mouth daily.    [provider]  EPINEPHrine (EPIPEN 2-PAK) 0.3 mg/0.3 mL IJ SOAJ injection Inject 0.3 mLs (0.3 mg total) into the muscle as needed for anaphylaxis. 05/13/19   Vivi Barrack, MD  fluticasone [facility]) 50 MCG/ACT nasal spray Place 2 sprays into both nostrils daily as needed for allergies or rhinitis.    [provider]  fluticasone-salmeterol (ADVAIR) 250-50 MCG/ACT AEPB Inhale 1 puff into the lungs every 12 (twelve) hours. 10/30/21   Mannam, Hart Robinsons, MD  midodrine (PROAMATINE) 5 MG tablet Take 1 tablet (5 mg total) by mouth 3 (three) times daily with meals. Take 1 tablet at 8am, 12 noon and 4pm 12/12/21  Deboraha Sprang, MD  rosuvastatin (CRESTOR) 20 MG tablet TAKE 1 TABLET(20 MG) BY MOUTH DAILY AT 6 PM 03/22/22   Waynetta Sandy, MD      Allergies    Bee venom and Meclizine    Review of Systems   Review of Systems  Constitutional:  Negative for chills, diaphoresis, fatigue and fever.  HENT:  Negative for congestion, rhinorrhea and sneezing.   Eyes:  Positive for visual disturbance.  Respiratory:  Negative for cough, chest tightness and shortness of breath.   Cardiovascular:  Negative for chest pain and leg swelling.  Gastrointestinal:  Negative for abdominal pain, blood in stool, diarrhea, nausea and vomiting.  Genitourinary:  Negative for difficulty urinating, flank pain, frequency and hematuria.   Musculoskeletal:  Negative for arthralgias and back pain.  Skin:  Negative for rash.  Neurological:  Positive for dizziness and light-headedness. Negative for speech difficulty, weakness, numbness and headaches.    Physical Exam Updated Vital Signs BP (!) 165/94   Pulse (!) 55   Temp 97.6 F (36.4 C)   Resp 18   Ht '5\' 11"'$  (1.803 m)   Wt 95.3 kg   SpO2 98%   BMI 29.30 kg/m  Physical Exam Constitutional:      Appearance: He is well-developed.  HENT:     Head: Normocephalic and atraumatic.  Eyes:     Pupils: Pupils are equal, round, and reactive to light.  Cardiovascular:     Rate and Rhythm: Normal rate and regular rhythm.     Heart sounds: Normal heart sounds.  Pulmonary:     Effort: Pulmonary effort is normal. No respiratory distress.     Breath sounds: Normal breath sounds. No wheezing or rales.  Chest:     Chest wall: No tenderness.  Abdominal:     General: Bowel sounds are normal.     Palpations: Abdomen is soft.     Tenderness: There is no abdominal tenderness. There is no guarding or rebound.  Musculoskeletal:        General: Normal range of motion.     Cervical back: Normal range of motion and neck supple.  Lymphadenopathy:     Cervical: No cervical adenopathy.  Skin:    General: Skin is warm and dry.     Findings: No rash.  Neurological:     Mental Status: He is alert and oriented to person, place, and time.     Comments: Motor 5/5 all extremities Sensation grossly intact to LT all extremities Finger to Nose intact, no pronator drift CN II-XII grossly intact Visual fields full to confrontation       ED Results / Procedures / Treatments   Labs (all labs ordered are listed, but only abnormal results are displayed) Labs Reviewed  CBG MONITORING, ED - Abnormal; Notable for the following components:      Result Value   Glucose-Capillary 118 (*)    All other components within normal limits  PROTIME-INR  APTT  CBC  DIFFERENTIAL  COMPREHENSIVE  METABOLIC PANEL  ETHANOL    EKG EKG Interpretation  Date/Time:  Monday May 05 2022 13:49:57 EDT Ventricular Rate:  71 PR Interval:  170 QRS Duration: 134 QT Interval:  398 QTC Calculation: 432 R Axis:   -20 Text Interpretation: Normal sinus rhythm Right bundle branch block Abnormal ECG When compared with ECG of 17-Apr-2020 17:29, No significant change was found Confirmed by Elnora Morrison [id number]) on 05/05/2022 2:38:14 PM  Radiology CT HEAD WO CONTRAST  Result Date: 05/05/2022  CLINICAL DATA:  Bilateral digit numbness; eyelid drooping EXAM: CT HEAD WITHOUT CONTRAST TECHNIQUE: Contiguous axial images were obtained from the base of the skull through the vertex without intravenous contrast. RADIATION DOSE REDUCTION: This exam was performed according to the departmental dose-optimization program which includes automated exposure control, adjustment of the mA and/or kV according to patient size and/or use of iterative reconstruction technique. COMPARISON:  04/17/2020 FINDINGS: Brain: No evidence of acute infarction, hemorrhage, mass, mass effect, or midline shift. No hydrocephalus or extra-axial fluid collection. Periventricular white matter changes, likely the sequela of chronic small vessel ischemic disease. Vascular: No hyperdense vessel. Atherosclerotic calcifications in the intracranial carotid and vertebral arteries. Skull: Normal. Negative for fracture or focal lesion. Sinuses/Orbits: Mucosal thickening in the ethmoid air cells. The orbits are unremarkable. Other: The mastoid air cells are well aerated. IMPRESSION: No acute intracranial process. Electronically Signed   By: Merilyn Baba M.D.   On: 05/05/2022 14:54    Procedures Procedures    Medications Ordered in ED Medications - No data to display  ED Course/ Medical Decision Making/ A&P                           Medical Decision Making Amount and/or Complexity of Data Reviewed Labs: ordered. Radiology: ordered.   Patient is a  87 year old male who presents with some facial drooping and tingling to his fingers this morning.  He says his coordination is worse than his baseline and he had some associated vision changes.  He has had some chronic issues with coordination and dizziness with low blood pressures and vision changes over the last 6 months to a year.  This episode today was a little bit different and prompted him to proceed to the emergency room.  He has had a CT scan of his head here which shows no acute abnormalities.  Labs are nonconcerning.  His EKG does not show any arrhythmias.  Given his symptoms and underlying risk factors, I feel it is prudent to get an MRI.  Discussed this with the patient and his daughter.  The daughter will drive the patient, they are electing not to use the hospital transport service.  Spoke with Dr. Tomi Bamberger who is excepted the patient to the [facility] emergency department.  If the MRI is clear, patient can likely have ongoing follow-up with his cardiologist and neurologist.  Final Clinical Impression(s) / ED Diagnoses Final diagnoses:  Facial droop    Rx / DC Orders ED Discharge Orders     None         Malvin Johns, MD 05/05/22 1707

## 2022-05-06 NOTE — Discharge Instructions (Signed)
Your MRI did not show any stroke.  Please follow-up with your neurologist in the outpatient setting for a repeat evaluation.  Return to the ER for recurrent symptoms.

## 2022-05-06 NOTE — ED Provider Notes (Signed)
Patient transferred for MRI for further evaluation of neurologic symptoms.  Patient had bilateral finger tingling and right eyelid drooping earlier today, all of which has resolved.  Patient feels that it might be related to his chronic hypotension.  As his blood pressure improved, symptoms resolved.  MRI has been performed and is negative.  Will discharge, follow-up with his neurologist.   Orpah Greek, MD 05/06/22 [phone]

## 2022-05-06 NOTE — ED Notes (Signed)
Pt was brought back from lobby to H011. Pt was ambulatory to bed. Pt states he feels back to normal. Resolved symptoms are tremor bilateral hands, facial droop, and decreased vision to eye. Family at bedside states noticing pt improvement.

## 2022-05-12 ENCOUNTER — Telehealth: Payer: Self-pay | Admitting: Cardiovascular Disease

## 2022-05-12 DIAGNOSIS — I951 Orthostatic hypotension: Secondary | ICD-10-CM

## 2022-05-12 NOTE — Telephone Encounter (Signed)
Pt c/o BP issue: STAT if pt c/o blurred vision, one-sided weakness or slurred speech  1. What are your last 5 BP readings? 94/64; 88/62; 112/65  2. Are you having any other symptoms (ex. Dizziness, headache, blurred vision, passed out)? Dizziness, blurred vision, pre-syncope, light-headedness  3. What is your BP issue? Patient is experiencing extreme low BP

## 2022-05-12 NOTE — Telephone Encounter (Signed)
Per Dr Caryl Comes pt will need to be seen sooner to make medication change and may offer to place pt on  schedule.

## 2022-05-12 NOTE — Telephone Encounter (Signed)
Patient called in with complaints of symptomatic low blood pressures. He states he was playing golf today and hand stop and sit on the ground in order not to fall. Light headed and weak when this happens. Systolic BP was in 07-21C/28-83D diastolic. Other times he reports it is normal in 110s over 60-70s.  He currently takes Midodrine '5mg'$  TID and has added additional salt to his food as advised by Dr. Caryl Comes in February.   He is concerned that it is affecting his ability to do activities and he is afraid of falling.  I advised him to stay hydrated, take precautions to avoid falls and that I would forward his concerns to Dr. Burt Knack for evaluation. Patient verbalized understanding.

## 2022-05-13 ENCOUNTER — Telehealth: Payer: Self-pay | Admitting: Cardiovascular Disease

## 2022-05-13 MED ORDER — FLUDROCORTISONE ACETATE 0.1 MG PO TABS: 0.1000 mg | ORAL_TABLET | Freq: Every day | ORAL | 3 refills | Status: DC

## 2022-05-13 MED ORDER — FLUDROCORTISONE ACETATE 0.1 MG PO TABS: 0.1000 mg | ORAL_TABLET | Freq: Every day | ORAL | Status: DC

## 2022-05-13 NOTE — Telephone Encounter (Signed)
Pt c/o medication issue:  1. Name of Medication:   florinef 0.'1mg'$  daily  2. How are you currently taking this medication (dosage and times per day)? N/A  3. Are you having a reaction (difficulty breathing--STAT)? N/A  4. What is your medication issue?   Patient called stating he was at the pharmacy ([place], [address] AT [address]) but his prescription for this medication had not been called in.  Patient stated he would like to get this medication today to start taking it.

## 2022-05-13 NOTE — Telephone Encounter (Signed)
Returned call to patient to let him know the medication has now been called in.

## 2022-05-13 NOTE — Telephone Encounter (Signed)
Spoke with patient to advise starting florinef 0.'1mg'$  daily. If symptoms do not improve he will contact us. He is to keep appointment with Dr Caryl Comes in November.  Patient verbalized understanding.

## 2022-05-13 NOTE — Addendum Note (Signed)
Addended by: Vergia Alcon A on: 05/13/2022 04:23 PM   Modules accepted: Orders

## 2022-05-13 NOTE — Telephone Encounter (Signed)
Patient called back regarding this medication and was hoping it could be sent to the pharmacy today.

## 2022-05-13 NOTE — Telephone Encounter (Signed)
Called and spoke to patient to discuss use of Florinef because pt's BP during his recent hospitalization on 7/24 was 165/94 and on 7/25 was 172/99. Pt states this was a one-time occurrence for him and he has not had systolic readings higher than 110 since this stay. States yesterday (05/12/22) he was 482 systolic upon awakening, decided to practice his golf swing and got so dizzy he began to fall, but caught himself and remained sitting "for a while" until someone helped him up. States his BP when he got home was 87/61. Recheck last night approx 50IB was 704 systolic. This morning at 10am, his BP was 888 systolic. He understands to begin the florinef and continue to be vigilant with checking his BP, he will let us know if he begins to trend and remain, high.

## 2022-05-13 NOTE — Telephone Encounter (Signed)
Sounds like he is taking midodrine 5 mg TID as instructed and has been compliant with increased fluid and sodium. Recommend add florinef 0.1 mg daily and continue other therapies. Pt scheduled to see Dr Caryl Comes in November. Have him contact us if no improvement with florinef. thanks

## 2022-05-13 NOTE — Telephone Encounter (Signed)
Pt calling again about his medication florinef 0.1 mg daily. Pt states that his pharmacy still has not received the prescription. Pt would like a call back concerning this matter. Please address

## 2022-05-22 ENCOUNTER — Telehealth: Payer: Self-pay | Admitting: Internal Medicine

## 2022-05-22 NOTE — Telephone Encounter (Signed)
Pt states he was told by Rosann Auerbach that Dr. Caryl Comes wanted to see him sooner, the next available is 07/2022.

## 2022-05-22 NOTE — Telephone Encounter (Signed)
Pt was advised Dr Olin Pia scheduler, Doylene Canning will contact pt to schedule appointment.

## 2022-05-22 NOTE — Telephone Encounter (Signed)
Spoke with pt who states Dr Burt Knack added Fludrocortisone 0.'1mg'$  - 1 tablet daily.  Pt states he has only seen minimal improvement of symptoms.  Pt advised Dr Caryl Comes would like for pt to be seen in the office and will have scheduler contact to move up pt's appointment.  He is not willing to be seen in the [place] office as he does not drive that far.  Pt verbalizes understanding and thanked Therapist, sports for the call.

## 2022-06-05 NOTE — Telephone Encounter (Signed)
Pt is scheduled to see Dr Caryl Comes 06/09/2022.

## 2022-06-09 ENCOUNTER — Encounter: Payer: Self-pay | Admitting: Internal Medicine

## 2022-06-09 ENCOUNTER — Ambulatory Visit: Payer: Medicare Other | Attending: Internal Medicine | Admitting: Internal Medicine

## 2022-06-09 VITALS — BP 128/78 | HR 53 | Ht 71.0 in | Wt 208.0 lb

## 2022-06-09 DIAGNOSIS — R42 Dizziness and giddiness: Secondary | ICD-10-CM | POA: Diagnosis not present

## 2022-06-09 DIAGNOSIS — I951 Orthostatic hypotension: Secondary | ICD-10-CM

## 2022-06-09 MED ORDER — FLUDROCORTISONE ACETATE 0.1 MG PO TABS: 0.1000 mg | ORAL_TABLET | Freq: Every day | ORAL | 3 refills | Status: DC

## 2022-06-09 NOTE — Progress Notes (Signed)
Patient Care Team: Vivi Barrack, MD as PCP - General (Family Medicine) Sherren Mocha, MD as PCP - Cardiology (Cardiology) Riki Rusk, MD (Internal Medicine)   HPI  Bryan Armstrong is a 87 y.o. male Seen in follow-up for orthostatic hypotension in the setting of peripheral neuropathy and gait disturbance and remote right bundle branch block.  Currently treated with ProAmatine  At the last visit, we increase his ProAmatine to 2.5--5 3 times daily.  Significant interval improvement.  However, some worsening symptoms this summer, atrial fludrocortisone per Dr. [facility] at 0.1 mg daily.  Associated with fluid retention.  About 5 to 10 days after this was initiated he had an episode of presyncope at the [employer] [place].  Otherwise his lightheadedness has been much improved on the fludrocortisone  History of peripheral vascular disease on aspirin and statins  DATE TEST EF    10/12 Echo  60-65 %    7/21 Echo  60-65 %               Date Cr K Hgb  11/21 1.41 4.0 13.8  6/22 1.15 4.3 13.8 (5/22)  7/23  4.0 14.8       Records and Results Reviewed   Past Medical History:  Diagnosis Date   BRADYCARDIA 06/01/2007   Cataract    bil cataracts removed   COPD (chronic obstructive pulmonary disease) ([place])    EMPHYSEMA 03/28/2008   Emphysema of lung ([facility])    GERD (gastroesophageal reflux disease)    HYPERGLYCEMIA 06/01/2007   HYPERLIPIDEMIA 06/01/2007   Hypertension    HYPERTENSION 01/19/2009   Peripheral arterial disease ([place])    TIA (transient ischemic attack)     Past Surgical History:  Procedure Laterality Date   ABDOMINAL AORTOGRAM W/LOWER EXTREMITY N/A 03/05/2020   Procedure: ABDOMINAL AORTOGRAM W/  LOWER EXTREMITY;  Surgeon: Lorretta Harp, MD;  Location: [place] [facility];  Service: Cardiovascular;  Laterality: N/A;   CATARACT EXTRACTION, BILATERAL     COLONOSCOPY     ENDARTERECTOMY FEMORAL Right 04/03/2020   Procedure: Right Femoral Endarterectomy;  Surgeon:  Elam Dutch, MD;  Location: [place];  Service: Vascular;  Laterality: Right;   ENDARTERECTOMY FEMORAL Left 09/10/2020   Procedure: ENDARTERECTOMY FEMORAL LEFT;  Surgeon: Elam Dutch, MD;  Location: [place];  Service: Vascular;  Laterality: Left;   EYE SURGERY     Bilaterl cataracts   PATCH ANGIOPLASTY Left 09/10/2020   Procedure: PATCH ANGIOPLASTY OF LEFT FEMORAL ARTERY USING HEMASHIELD PLATINUM FINESSE PATCH;  Surgeon: Elam Dutch, MD;  Location: [facility];  Service: Vascular;  Laterality: Left;   POLYPECTOMY     TONSILLECTOMY AND ADENOIDECTOMY      Current Meds  Medication Sig   aspirin 81 MG tablet Take 81 mg by mouth daily.    celecoxib (CELEBREX) 200 MG capsule Take 1 capsule (200 mg total) by mouth 2 (two) times daily as needed.   cyanocobalamin 2000 MCG tablet Take 2,000 mcg by mouth daily.   EPINEPHrine (EPIPEN 2-PAK) 0.3 mg/0.3 mL IJ SOAJ injection Inject 0.3 mLs (0.3 mg total) into the muscle as needed for anaphylaxis.   fluticasone (FLONASE) 50 MCG/ACT nasal spray Place 2 sprays into both nostrils daily as needed for allergies or rhinitis.   fluticasone-salmeterol (ADVAIR) 250-50 MCG/ACT AEPB Inhale 1 puff into the lungs every 12 (twelve) hours.   midodrine (PROAMATINE) 5 MG tablet Take 1 tablet (5 mg total) by mouth 3 (three) times daily with  meals. Take 1 tablet at 8am, 12 noon and 4pm   rosuvastatin (CRESTOR) 20 MG tablet TAKE 1 TABLET(20 MG) BY MOUTH DAILY AT 6 PM    Allergies  Allergen Reactions   Bee Venom Anaphylaxis    Yellow jackets   Meclizine     Fatigue       Review of Systems negative except from HPI and PMH  Physical Exam BP 132/82   Pulse (!) 57   Ht '5\' 11"'$  (1.803 m)   Wt 208 lb (94.3 kg)   SpO2 98%   BMI 29.01 kg/m  Well developed and nourished in no acute distress HENT normal Neck supple with JVP-  flat  Clear Regular rate and rhythm, no murmurs or gallops Abd-soft with active BS No Clubbing cyanosis edema Skin-warm and dry A &  Oriented  Grossly normal sensory and motor function  ECG sinus rhythm at 60   18/14/44 Right bundle branch block PVC-isolated   CrCl cannot be calculated (Patient's most recent lab result is older than the maximum 21 days allowed.).   Assessment and  Plan  Orthostatic hypotension   Diagnosis of vertigo, vasovagal syncope, ataxia   Peripheral vascular disease with prior lower extremity revascularization   RBBB-old   Neuropathy    We will continue the ProAmatine at 5 mg a day.  Fludrocortisone was started about 10 days ago by Dr. [facility].  Associated with fluid retention, and he has reduced his dose to 3 days a week.  We will see how that does, the fluid retention is somewhat improved.  The interval episode of presyncope occurred just a few days after the initiation of fludrocortisone and so we would anticipate that its maximal benefit is not yet present.  He is advised to let us know if he has recurrent presyncope; I worry about the risk of falls.  I continue also to be concerned about the possibility of autonomic failure

## 2022-06-09 NOTE — Patient Instructions (Addendum)
Medication Instructions:  Your physician has recommended you make the following change in your medication:   MEDICATION FREQUENCY CHANGE:  FLORINEF 0.1 MG-  YOU WILL TAKE ONE TABLET ( 0.1 MG) BY MOUTH DAILY; ONLY TAKE 3 DAYS PER WEEK.    Lab Work: YOU WILL HAVE LAB WORK DRAWN TODAY: BMET  Testing/Procedures: None ordered.  Follow-Up:  PLEASE SCHEDULE A FOLLOW UP APPOINTMENT WITH DR. Burt Knack IN THE NEXT 1-2 MONTHS.     Important Information About Sugar

## 2022-06-10 LAB — BASIC METABOLIC PANEL
BUN/Creatinine Ratio: 18 (ref 10–24)
BUN: 19 mg/dL (ref 8–27)
CO2: 24 mmol/L (ref 20–29)
Calcium: 9.1 mg/dL (ref 8.6–10.2)
Chloride: 106 mmol/L (ref 96–106)
Creatinine, Ser: 1.07 mg/dL (ref 0.76–1.27)
Glucose: 87 mg/dL (ref 70–99)
Potassium: 4 mmol/L (ref 3.5–5.2)
Sodium: 145 mmol/L — ABNORMAL HIGH (ref 134–144)
eGFR: 68 mL/min/{1.73_m2} (ref >59)

## 2022-06-19 ENCOUNTER — Telehealth: Payer: Self-pay | Admitting: Internal Medicine

## 2022-06-19 NOTE — Telephone Encounter (Signed)
Follow Up:     Patient is calling to find out his lab results from 06-09-22 please.Bryan Armstrong

## 2022-06-20 NOTE — Telephone Encounter (Signed)
Spoke with pt and advised per Dr Caryl Comes labs are normal.  Pt verbalizes understanding and thanked Therapist, sports for the call.

## 2022-06-21 ENCOUNTER — Other Ambulatory Visit: Payer: Self-pay | Admitting: Vascular Surgery

## 2022-06-23 ENCOUNTER — Other Ambulatory Visit: Payer: Self-pay | Admitting: Family Medicine

## 2022-06-23 NOTE — Telephone Encounter (Signed)
Patient states Pharmacy listed below told Patient the following RX request was denied by Dr. Jerline Pain.   LAST APPOINTMENT DATE:  Please schedule appointment if longer than 1 year  02/24/22  NEXT APPOINTMENT DATE:  MEDICATION: rosuvastatin (CRESTOR) 20 MG tablet  Is the patient out of medication? No-approx. 1-2 weeks  PHARMACY: [facility] [place], [address] AT [address] Phone:  [phone]  Fax:  [phone]      Let patient know to contact pharmacy at the end of the day to make sure medication is ready.  Please notify patient to allow 48-72 hours to process

## 2022-06-23 NOTE — Telephone Encounter (Signed)
Patient needs to have refills through PCP

## 2022-06-25 ENCOUNTER — Other Ambulatory Visit: Payer: Self-pay | Admitting: *Deleted

## 2022-06-25 MED ORDER — ROSUVASTATIN CALCIUM 20 MG PO TABS: ORAL_TABLET | ORAL | 0 refills | Status: DC

## 2022-06-25 NOTE — Telephone Encounter (Signed)
Last refill done by Waynetta Sandy, MD

## 2022-06-26 MED ORDER — ROSUVASTATIN CALCIUM 20 MG PO TABS: ORAL_TABLET | ORAL | 0 refills | Status: DC

## 2022-06-27 ENCOUNTER — Ambulatory Visit: Payer: Medicare Other | Admitting: Family Medicine

## 2022-07-10 ENCOUNTER — Ambulatory Visit: Payer: Medicare Other | Attending: Cardiovascular Disease | Admitting: Cardiovascular Disease

## 2022-07-10 ENCOUNTER — Encounter: Payer: Self-pay | Admitting: Cardiovascular Disease

## 2022-07-10 VITALS — BP 132/88 | HR 59 | Ht 71.0 in | Wt 209.8 lb

## 2022-07-10 DIAGNOSIS — I739 Peripheral vascular disease, unspecified: Secondary | ICD-10-CM

## 2022-07-10 DIAGNOSIS — I951 Orthostatic hypotension: Secondary | ICD-10-CM | POA: Diagnosis not present

## 2022-07-10 MED ORDER — FLUDROCORTISONE ACETATE 0.1 MG PO TABS: ORAL_TABLET | ORAL | 3 refills | Status: DC

## 2022-07-10 NOTE — Progress Notes (Signed)
Cardiology Office Note:    Date:  07/10/2022   ID:  Bryan Armstrong, DOB [birth year], MRN [id number]  PCP:  Vivi Barrack, [place] [facility] Cardiologist:  Sherren Mocha, MD     Referring MD: Vivi Barrack, MD   Chief Complaint  Patient presents with   Dizziness    History of Present Illness:    Bryan Armstrong is a 87 y.o. male with a hx of hypertension, bradycardia, and orthostatic hypotension, presenting for follow-up evaluation.  The patient had persistent symptoms of lightheadedness and near syncope on midodrine.  He was started on fludrocortisone and those symptoms improved, but he started to have swelling.  He reduced his fludrocortisone dose from 0.1 mg daily to taking it just on Monday, Wednesday, and Friday.  His symptoms have settled out nicely and he is not having any further lightheadedness or weakness.  He states his blood pressures been running in the normal range.  He denies any further leg swelling.  No chest pain or pressure.  No other cardiac-related complaints today.  Past Medical History:  Diagnosis Date   BRADYCARDIA 06/01/2007   Cataract    bil cataracts removed   COPD (chronic obstructive pulmonary disease) ([place])    EMPHYSEMA 03/28/2008   Emphysema of lung ([facility])    GERD (gastroesophageal reflux disease)    HYPERGLYCEMIA 06/01/2007   HYPERLIPIDEMIA 06/01/2007   Hypertension    HYPERTENSION 01/19/2009   Peripheral arterial disease ([place])    TIA (transient ischemic attack)     Past Surgical History:  Procedure Laterality Date   ABDOMINAL AORTOGRAM W/LOWER EXTREMITY N/A 03/05/2020   Procedure: ABDOMINAL AORTOGRAM W/  LOWER EXTREMITY;  Surgeon: Lorretta Harp, MD;  Location: [facility];  Service: Cardiovascular;  Laterality: N/A;   CATARACT EXTRACTION, BILATERAL     COLONOSCOPY     ENDARTERECTOMY FEMORAL Right 04/03/2020   Procedure: Right Femoral Endarterectomy;  Surgeon: Elam Dutch, MD;  Location: [facility];  Service:  Vascular;  Laterality: Right;   ENDARTERECTOMY FEMORAL Left 09/10/2020   Procedure: ENDARTERECTOMY FEMORAL LEFT;  Surgeon: Elam Dutch, MD;  Location: [facility];  Service: Vascular;  Laterality: Left;   EYE SURGERY     Bilaterl cataracts   PATCH ANGIOPLASTY Left 09/10/2020   Procedure: PATCH ANGIOPLASTY OF LEFT FEMORAL ARTERY USING HEMASHIELD PLATINUM FINESSE PATCH;  Surgeon: Elam Dutch, MD;  [facility];  Service: Vascular;  Laterality: Left;   POLYPECTOMY     TONSILLECTOMY AND ADENOIDECTOMY      Current Medications: Current Meds  Medication Sig   aspirin 81 MG tablet Take 81 mg by mouth daily.    celecoxib (CELEBREX) 200 MG capsule Take 1 capsule (200 mg total) by mouth 2 (two) times daily as needed.   cyanocobalamin 2000 MCG tablet Take 2,000 mcg by mouth daily.   EPINEPHrine (EPIPEN 2-PAK) 0.3 mg/0.3 mL IJ SOAJ injection Inject 0.3 mLs (0.3 mg total) into the muscle as needed for anaphylaxis.   fluticasone (FLONASE) 50 MCG/ACT nasal spray Place 2 sprays into both nostrils daily as needed for allergies or rhinitis.   fluticasone-salmeterol (ADVAIR) 250-50 MCG/ACT AEPB Inhale 1 puff into the lungs every 12 (twelve) hours.   midodrine (PROAMATINE) 5 MG tablet Take 1 tablet (5 mg total) by mouth 3 (three) times daily with meals. Take 1 tablet at 8am, 12 noon and 4pm   rosuvastatin (CRESTOR) 20 MG tablet TAKE 1 TABLET(20 MG) BY MOUTH DAILY AT 6 PM   [  DISCONTINUED] fludrocortisone (FLORINEF) 0.1 MG tablet Take 1 tablet (0.1 mg total) by mouth daily. ONLY TAKE THIS MEDICATION 3 DAYS A WEEK.     Allergies:   Bee venom and Meclizine   Social History   Socioeconomic History   Marital status: Widowed    Spouse name: Not on file   Number of children: Not on file   Years of education: Not on file   Highest education level: Not on file  Occupational History   Occupation: Retired   Tobacco Use   Smoking status: Former    Packs/day: 0.50    Years: 40.00    Total pack years:  20.00    Types: Cigarettes    Quit date: 09/26/2017    Years since quitting: 4.7   Smokeless tobacco: Never   Tobacco comments:    stopped 2018  Vaping Use   Vaping Use: Never used  Substance and Sexual Activity   Alcohol use: No   Drug use: No   Sexual activity: Not on file  Other Topics Concern   Not on file  Social History Narrative   Lives at home with wife   Right handed   Caffeine: drinks de-caf drinks, diet drinks if its a coke.   Social Determinants of Health   Financial Resource Strain: Not on file  Food Insecurity: Not on file  Transportation Needs: Not on file  Physical Activity: Not on file  Stress: Not on file  Social Connections: Not on file     Family History: The patient's family history includes Cancer in his mother; Heart disease in his father; Uterine cancer in his mother. There is no history of Colon cancer, Esophageal cancer, Rectal cancer, Stomach cancer, Liver cancer, Pancreatic cancer, Prostate cancer, or Stroke.  ROS:   Please see the history of present illness.    All other systems reviewed and are negative.  EKGs/Labs/Other Studies Reviewed:    The following studies were reviewed today: Echo 04/18/2020: 1. Left ventricular ejection fraction, by estimation, is 60 to 65%. The  left ventricle has normal function. The left ventricle has no regional  wall motion abnormalities. Left ventricular diastolic parameters were  normal.   2. Right ventricular systolic function is normal. The right ventricular  size is normal.   3. Left atrial size was mildly dilated.   4. The mitral valve is normal in structure. Trivial mitral valve  regurgitation. No evidence of mitral stenosis.   5. The aortic valve is normal in structure. Aortic valve regurgitation is  not visualized. Mild aortic valve sclerosis is present, with no evidence  of aortic valve stenosis.   6. The inferior vena cava is normal in size with greater than 50%  respiratory variability,  suggesting right atrial pressure of 3 mmHg.   Myocardial Perfusion Scan 03/21/2020: Nuclear stress EF: 59%. There was no ST segment deviation noted during stress. The study is normal. This is a low risk study. The left ventricular ejection fraction is normal (55-65%).  Event Monitor 03/21/2021: SUMMARY: The basic rhythm is normal sinus with an average HR of 60 bpm No atrial fibrillation or flutter No high-grade heart block or pathologic pauses There are rare PVC's (<1% burden) and occasional supraventricular beats (1.8% burden) without sustained arrhythmias  EKG:  EKG is not ordered today.   Recent Labs: 02/24/2022: TSH 2.50 05/05/2022: ALT 22; Hemoglobin 14.8; Platelets 206 06/09/2022: BUN 19; Creatinine, Ser 1.07; Potassium 4.0; Sodium 145  Recent Lipid Panel    Component Value Date/Time  CHOL 105 02/24/2022 1414   TRIG 137.0 02/24/2022 1414   HDL 33.40 (L) 02/24/2022 1414   CHOLHDL 3 02/24/2022 1414   VLDL 27.4 02/24/2022 1414   LDLCALC 44 02/24/2022 1414     Risk Assessment/Calculations:                Physical Exam:    VS:  BP 132/88   Pulse (!) 59   Ht '5\' 11"'$  (1.803 m)   Wt 209 lb 12.8 oz (95.2 kg)   SpO2 97%   BMI 29.26 kg/m     Wt Readings from Last 3 Encounters:  07/10/22 209 lb 12.8 oz (95.2 kg)  06/09/22 208 lb (94.3 kg)  05/05/22 210 lb 1.6 oz (95.3 kg)     GEN:  Well nourished, well developed in no acute distress HEENT: Normal NECK: No JVD; No carotid bruits LYMPHATICS: No lymphadenopathy CARDIAC: RRR, no murmurs, rubs, gallops RESPIRATORY:  Clear to auscultation without rales, wheezing or rhonchi  ABDOMEN: Soft, non-tender, non-distended MUSCULOSKELETAL:  No edema; No deformity  SKIN: Warm and dry NEUROLOGIC:  Alert and oriented x 3 PSYCHIATRIC:  Normal affect   ASSESSMENT:    1. Orthostatic hypotension   2. Peripheral arterial disease ([place])    PLAN:    In order of problems listed above:  Symptoms have resolved on a combination of  midodrine and fludrocortisone.  He is taking fludrocortisone 3 days weekly as he had significant swelling and hypertension when he was taking it on a daily basis.  His symptoms have really resolved and the side effects have also resolved as he has no further edema or resting hypertension.  He will continue his current medical program.  He drinks plenty of fluids.  He follows up with Dr. Caryl Comes in about a month.  I will see him back in 1 year. Continue with medical therapy that includes aspirin and a statin drug.     Medication Adjustments/Labs and Tests Ordered: Current medicines are reviewed at length with the patient today.  Concerns regarding medicines are outlined above.  No orders of the defined types were placed in this encounter.  Meds ordered this encounter  Medications   fludrocortisone (FLORINEF) 0.1 MG tablet    Sig: Take 1 tablet by mouth three days per week only    Dispense:  30 tablet    Refill:  3    Patient Instructions  Medication Instructions:  Your physician recommends that you continue on your current medications as directed. Please refer to the Current Medication list given to you today. REFILLED Florinef/Fludricortisone 0.'1mg'$  tabs *If you need a refill on your cardiac medications before your next appointment, please call your pharmacy*   Lab Work: NONE If you have labs (blood work) drawn today and your tests are completely normal, you will receive your results only by: [place] (if you have MyChart) OR A paper copy in the mail If you have any lab test that is abnormal or we need to change your treatment, we will call you to review the results.   Testing/Procedures: NONE   Follow-Up: At [facility], you and your health needs are our priority.  As part of our continuing mission to provide you with exceptional heart care, we have created designated Provider Care Teams.  These Care Teams include your primary Cardiologist (physician) and Advanced  Practice Providers (APPs -  Physician Assistants and Nurse Practitioners) who all work together to provide you with the care you need, when you need it.  We recommend signing up for the patient portal called "MyChart".  Sign up information is provided on this After Visit Summary.  MyChart is used to connect with patients for Virtual Visits (Telemedicine).  Patients are able to view lab/test results, encounter notes, upcoming appointments, etc.  Non-urgent messages can be sent to your provider as well.   To learn more about what you can do with MyChart, go to [email or web address].    Your next appointment:   1 year(s)  The format for your next appointment:   In Person  Provider:   Sherren Mocha, MD       Important Information About Sugar         Signed, Sherren Mocha, MD  07/10/2022 4:47 PM    [place]

## 2022-07-10 NOTE — Patient Instructions (Signed)
Medication Instructions:  Your physician recommends that you continue on your current medications as directed. Please refer to the Current Medication list given to you today. REFILLED Florinef/Fludricortisone 0.'1mg'$  tabs *If you need a refill on your cardiac medications before your next appointment, please call your pharmacy*   Lab Work: NONE If you have labs (blood work) drawn today and your tests are completely normal, you will receive your results only by: [place] (if you have MyChart) OR A paper copy in the mail If you have any lab test that is abnormal or we need to change your treatment, we will call you to review the results.   Testing/Procedures: NONE   Follow-Up: At [facility], you and your health needs are our priority.  As part of our continuing mission to provide you with exceptional heart care, we have created designated Provider Care Teams.  These Care Teams include your primary Cardiologist (physician) and Advanced Practice Providers (APPs -  Physician Assistants and Nurse Practitioners) who all work together to provide you with the care you need, when you need it.  We recommend signing up for the patient portal called "MyChart".  Sign up information is provided on this After Visit Summary.  MyChart is used to connect with patients for Virtual Visits (Telemedicine).  Patients are able to view lab/test results, encounter notes, upcoming appointments, etc.  Non-urgent messages can be sent to your provider as well.   To learn more about what you can do with MyChart, go to [email or web address].    Your next appointment:   1 year(s)  The format for your next appointment:   In Person  Provider:   Sherren Mocha, MD       Important Information About Sugar

## 2022-07-14 ENCOUNTER — Encounter (INDEPENDENT_AMBULATORY_CARE_PROVIDER_SITE_OTHER): Payer: Medicare Other | Admitting: Ophthalmology

## 2022-08-12 DIAGNOSIS — R55 Syncope and collapse: Secondary | ICD-10-CM | POA: Insufficient documentation

## 2022-08-13 ENCOUNTER — Ambulatory Visit: Payer: Medicare Other | Admitting: Internal Medicine

## 2022-08-13 ENCOUNTER — Ambulatory Visit: Payer: Medicare Other | Attending: Internal Medicine | Admitting: Internal Medicine

## 2022-08-13 ENCOUNTER — Encounter: Payer: Self-pay | Admitting: Internal Medicine

## 2022-08-13 VITALS — BP 84/72 | HR 59

## 2022-08-13 DIAGNOSIS — I951 Orthostatic hypotension: Secondary | ICD-10-CM | POA: Diagnosis not present

## 2022-08-13 DIAGNOSIS — R55 Syncope and collapse: Secondary | ICD-10-CM | POA: Diagnosis not present

## 2022-08-13 NOTE — Patient Instructions (Signed)
Medication Instructions:  Your physician recommends that you continue on your current medications as directed. Please refer to the Current Medication list given to you today.  *If you need a refill on your cardiac medications before your next appointment, please call your pharmacy*   Lab Work: None ordered.  If you have labs (blood work) drawn today and your tests are completely normal, you will receive your results only by: MyChart Message (if you have MyChart) OR A paper copy in the mail If you have any lab test that is abnormal or we need to change your treatment, we will call you to review the results.   Testing/Procedures: None ordered.    Follow-Up: At [facility], you and your health needs are our priority.  As part of our continuing mission to provide you with exceptional heart care, we have created designated Provider Care Teams.  These Care Teams include your primary Cardiologist (physician) and Advanced Practice Providers (APPs -  Physician Assistants and Nurse Practitioners) who all work together to provide you with the care you need, when you need it.  We recommend signing up for the patient portal called "MyChart".  Sign up information is provided on this After Visit Summary.  MyChart is used to connect with patients for Virtual Visits (Telemedicine).  Patients are able to view lab/test results, encounter notes, upcoming appointments, etc.  Non-urgent messages can be sent to your provider as well.   To learn more about what you can do with MyChart, go to [email or web address].    Your next appointment:   6 months with Dr Klein  Important Information About Sugar       

## 2022-08-13 NOTE — Progress Notes (Unsigned)
Patient Care Team: Vivi Barrack, MD as PCP - General (Family Medicine) Sherren Mocha, MD as PCP - Cardiology (Cardiology) Riki Rusk, MD (Internal Medicine)   HPI  Bryan Armstrong is a 87 y.o. male Seen in follow-up for orthostatic hypotension in the setting of peripheral neuropathy and gait disturbance and remote right bundle branch block.  Currently treated with ProAmatine  At the last visit, we increase his ProAmatine to 2.5--5 3 times daily.  Significant interval improvement.  However, some worsening symptoms this summer, atrial fludrocortisone per Dr. [facility], at 0.1 mg daily.  Associated with fluid retention.  About 5 to 10 days after this was initiated he had an episode of presyncope at the [place].  Otherwise his lightheadedness has been much improved on the fludrocortisone He is also noted further improvements by decreasing his a.m. meal size.  No shower intolerance.    History of peripheral vascular disease on aspirin and statins  DATE TEST EF    10/12 Echo  60-65 %    7/21 Echo  60-65 %               Date Cr K Hgb  11/21 1.41 4.0 13.8  6/22 1.15 4.3 13.8 (5/22)  7/23 1.07 (8/23) 4.0 14.8       Records and Results Reviewed   Past Medical History:  Diagnosis Date   BRADYCARDIA 06/01/2007   Cataract    bil cataracts removed   COPD (chronic obstructive pulmonary disease) ([place])    EMPHYSEMA 03/28/2008   Emphysema of lung ([facility])    GERD (gastroesophageal reflux disease)    HYPERGLYCEMIA 06/01/2007   HYPERLIPIDEMIA 06/01/2007   Hypertension    HYPERTENSION 01/19/2009   Peripheral arterial disease ([place])    TIA (transient ischemic attack)     Past Surgical History:  Procedure Laterality Date   ABDOMINAL AORTOGRAM W/LOWER EXTREMITY N/A 03/05/2020   Procedure: ABDOMINAL AORTOGRAM W/  LOWER EXTREMITY;  Surgeon: Lorretta Harp, MD;  Location: [place] [facility];  Service: Cardiovascular;  Laterality: N/A;   CATARACT EXTRACTION, BILATERAL      COLONOSCOPY     ENDARTERECTOMY FEMORAL Right 04/03/2020   Procedure: Right Femoral Endarterectomy;  Surgeon: Elam Dutch, MD;  Location: [facility];  Service: Vascular;  Laterality: Right;   ENDARTERECTOMY FEMORAL Left 09/10/2020   Procedure: ENDARTERECTOMY FEMORAL LEFT;  Surgeon: Elam Dutch, MD;  Location: [facility];  Service: Vascular;  Laterality: Left;   EYE SURGERY     Bilaterl cataracts   PATCH ANGIOPLASTY Left 09/10/2020   Procedure: PATCH ANGIOPLASTY OF LEFT FEMORAL ARTERY USING HEMASHIELD PLATINUM FINESSE PATCH;  Surgeon: Elam Dutch, MD;  [facility];  Service: Vascular;  Laterality: Left;   POLYPECTOMY     TONSILLECTOMY AND ADENOIDECTOMY      Current Meds  Medication Sig   aspirin 81 MG tablet Take 81 mg by mouth daily.    celecoxib (CELEBREX) 200 MG capsule Take 1 capsule (200 mg total) by mouth 2 (two) times daily as needed.   cyanocobalamin 2000 MCG tablet Take 2,000 mcg by mouth daily.   EPINEPHrine (EPIPEN 2-PAK) 0.3 mg/0.3 mL IJ SOAJ injection Inject 0.3 mLs (0.3 mg total) into the muscle as needed for anaphylaxis.   fludrocortisone (FLORINEF) 0.1 MG tablet Take 1 tablet by mouth three days per week only   fluticasone (FLONASE) 50 MCG/ACT nasal spray Place 2 sprays into both nostrils daily as needed for allergies or rhinitis.  fluticasone-salmeterol (ADVAIR) 250-50 MCG/ACT AEPB Inhale 1 puff into the lungs every 12 (twelve) hours.   midodrine (PROAMATINE) 5 MG tablet Take 1 tablet (5 mg total) by mouth 3 (three) times daily with meals. Take 1 tablet at 8am, 12 noon and 4pm   rosuvastatin (CRESTOR) 20 MG tablet TAKE 1 TABLET(20 MG) BY MOUTH DAILY AT 6 PM    Allergies  Allergen Reactions   Bee Venom Anaphylaxis    Yellow jackets   Meclizine     Fatigue       Review of Systems negative except from HPI and PMH  Physical Exam BP (!) 84/72 (BP Location: Right Arm, Patient Position: Standing)   Pulse (!) 59  Well developed and nourished in no acute  distress HENT normal Neck supple with JVP-  flat   Clear Regular rate and rhythm, no murmurs or gallops Abd-soft with active BS No Clubbing cyanosis edema Skin-warm and dry A & Oriented  Grossly normal sensory and motor function  ECG sinus at 52-intervals 19/14/46 Axis left -20 Right bundle branch block   CrCl cannot be calculated (Patient's most recent lab result is older than the maximum 21 days allowed.).   Assessment and  Plan  Orthostatic hypotension   Diagnosis of vertigo, vasovagal syncope, ataxia   Peripheral vascular disease with prior lower extremity revascularization   RBBB-old   Neuropathy    Continue the ProAmatine 5 mg 3 times a day.  Fludrocortisone 3 times a week.  His symptoms are largely attenuated not withstanding the objective decrease in blood pressure of about 50 mm.  We will check a metabolic profile at next visit.  Continue aspirin.  No bleeding.

## 2022-09-11 ENCOUNTER — Telehealth: Payer: Self-pay | Admitting: *Deleted

## 2022-09-11 NOTE — Patient Outreach (Signed)
  Care Coordination   09/11/2022 Name: TEMITOPE GRIFFING MRN: [id number] DOB: [birth year]   Care Coordination Outreach Attempts:  An unsuccessful telephone outreach was attempted today to offer the patient information about available care coordination services as a benefit of their health plan.   Follow Up Plan:  Additional outreach attempts will be made to offer the patient care coordination information and services.   Encounter Outcome:  No Answer   Care Coordination Interventions:  No, not indicated    Raina Mina, RN Care Management Coordinator [place] Office [phone]

## 2022-09-23 NOTE — Progress Notes (Signed)
[facility] [address] [place]. [place] [address] [phone]       OFFICE FOLLOW-UP NOTE  Bryan Armstrong Date of Birth:  [birth year] Medical Record Number:  [id number]    Chief Complaint  Patient presents with   Follow-up    RM 3 alone Pt is well and stable, still having blood pressure fluctuation but no new concerns        HPI:   09/24/2022 Bryan Armstrong: Patient returns for 48-month follow-up unaccompanied.    Back in July, episode of transient right eyelid drooping and bilateral fingertip numbness, was seen in ED, MRI negative for acute stroke.  Mention possibly due to low blood pressure.  Cardiology added Florinef in addition to midodrine for symptomatic hypotension, continues to be closely followed by cardiology Dr. Graciela Armstrong and Dr. Excell Armstrong.  Denies any new stroke/TIA symptoms since that time.  Reports stabilization of blood pressure over the past couple of weeks weeks. Does report occasional fluctuation of right vision. Feels like his right eye will "flutter" and that's how he knows his blood pressure is low. He was seen by Dr. Dione Armstrong back in July with extensive vision exam without any concerning findings. He plans on scheduling a f/u visit to further discuss continued vision issues.   Feels like neuropathy symptoms have "leveled off" and possibly improved especially since starting B12 oral supplement. Does have persistent bilateral foot mild tingling sensation but no significant pain  Impaired gait/imbalance stable since prior visit, denies any worsening.  Completed PT which was greatly beneficial and continues to do exercises at home.  He does try to be very cautious with walking especially on uneven ground.     History provided for reference purposes only Update 03/18/2022 Bryan Armstrong: Patient returns for follow-up visit after prior visit with Dr. Pearlean Armstrong approximately 4 months ago.  Since prior visit, reports neuropathy continues to improve since correcting B12 deficiency.   Recent B12 level satisfactory, recently monitored by PCP.  Neuropathy panel checked at prior visit which showed abnormal M protein spike and referred to hematology for further evaluation, also noted elevated rheumatoid factor but as asymptomatic, further evaluation not pursued.    He has also been having issues with imbalance/unsteadiness.  Currently working with PT with continued improvement.  He will have imbalance at times with hypotension but rare occurrence is relatively well controlled on midodrine.  Also mentions worsening imbalance with vision fluctuation.  Reports seeing objects smaller from right eye with left eye closed which is not new.  Also mentions ongoing transient right eye altitudinal visual loss (previously discussed with Dr. Pearlean Armstrong) but has been improving.  He questions further evaluation with ophthalmologist.  No new concerns at this time.  Update 12/04/2021 Dr. Pearlean Armstrong He returns for follow-up after last visit 3 months ago.  Patient states has noticed improvement in the tingling numbness in his feet after starting vitamin B-12 replacement for a month.  His vitamin B12 was found to be low at 212.  TSH and hemoglobin A1c were normal.  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  Patient was started on B12 replacement injections and now has been switched to tablets and has noticed a distinct improvement in the numbness which used to be constant every time he rested on laydown is now on intermittent and is not as bothersome.  He also is to have some mental fogging and lack of thought clarity which also seems to be improving.  He complains of  occasional transient right altitudinal vision loss for 5 to 10 seconds of especially in his standing or sitting this may be related to his low blood pressure resume at baseline returns below.  Recently his blood pressure medications were reduced.  He has been started on midodrine for orthostatic  hypotension.  He also has some difficulty with activities like playing golf for he feels he is slightly off balance when when he is completing the swing he is not sure if he will fall.   Update 08/26/2021 Bryan Armstrong: Returns for 62-month stroke follow-up.  Stable from stroke standpoint without new stroke/TIA symptoms.  Compliant on aspirin and Crestor -denies side effects.  Blood pressure today 154/82.  Continues to experience bilateral toe numbness as well as imbalance (discussed at prior visit). Denies worsening since prior visit. B/l toe numbness/tingling worse at night but denies associated pain.  Has been taking B12 supplement. Continues on midodrine 2.5mg  TID which has been helping BP and preventing orthostatic hypotension. Feels like legs may be weaker since prior visit. Occasional vertigo - quick lasting.  Has been participating at [employer] routinely and playing golf.  No further concerns at this time.  Update 04/16/2021 Bryan Armstrong: [name] returns for 53-month follow-up.  Stable from stroke standpoint without new stroke/TIA symptoms.  He did have 1 episode of vertigo back in April which subsided after performing Epley maneuver -no additional episodes since that time.  His greatest concern today is in regards to gradually worsening BLE numbness/tingling present at nighttime -denies associated pain. PCP recently checked lab work (02/19/2021) with B12 212 and started on monthly injections with last injection 5/27.  He believes he may have had some benefit of symptoms after B12 injection.  Continues to follow with cardiology for symptomatic hypotension and started on midodrine on 5/27 - he reports taking approx 6-7 pills since starting using more frequently in the first 2 weeks. He does have follow up with Dr. Graciela Armstrong 7/7. BP has been more regulated where he has not been experiencing low blood pressure.  Blood pressure today 102/60.  Compliant on aspirin and Crestor without associated side effects.  No further concerns at this  time.  Update 11/19/2020 Bryan Armstrong: [name] returns for TIA follow-up as well as concerns of dizziness/vertigo unaccompanied.  Reports episode of vertigo 1 week ago where he felt as though he was getting pulled towards the right side and was able to lower himself to his knees as he felt as though he was going to fall.  This sensation lasted for 2 to 3 minutes and then subsided.  He laid down in his bed and took a nap for approximately 20 minutes with resolution of symptoms upon awakening.  Denies any other associated symptoms such as weakness, numbness/tingling, visual changes, speech changes, facial weakness or headaches.  He was evaluated by ENT for continued vertigo (unable to view via epic) - per patient, told left inner ear issue and referred to PT for vestibular rehab.  He has been working with neuro rehab PT with improvement of symptoms.  He is able to turn his head more without provoking symptoms and symptoms typically progress with quick head movements.  He did trial meclizine x1 with increased fatigue without noticeable benefit.  Otherwise, he has been stable from a stroke standpoint.  Reports ongoing compliance with aspirin 81 mg daily and Crestor 20 mg daily and denies side effects.  Blood pressure today 127/77.  Previously having issues with hypotension therefore PCP discontinued Maxzide and blood pressure has been  slowly stabilizing.  He will occasionally have low blood pressure still where he will feel lightheaded in 1 to 2 seconds OD visual change which are typical symptoms of low blood pressure for patient.  No further concerns at this time.  Initial visit 06/19/2020 Dr. Pearlean Armstrong: Mr. Lowy is a 87 year old Caucasian male seen today for initial office follow-up visit following hospital consultation for TIA in July 2021.  History is obtained from the patient, review of electronic medical records and I personally reviewed available imaging films in PACS.  He has past medical history for hyperlipidemia,  hypertension, peripheral arterial disease, COPD, bradycardia with history of syncope who presented to [facility] on 04/17/2020 with sudden onset of gait instability.  He felt suddenly off balance and was leaning to the right side and slammed into a wall.  He denied any accompanying vertigo, room spinning, lightheadedness blurred vision no slurred speech or double vision.  This lasted about 5 minutes and then gradually returned back to normal.  He stated he had a previous episode 10 years ago of suddenly collapsing after getting up from the dining table at a restaurant and was felt to have had a vasovagal syncope at that time.  He does have history of peripheral arterial disease and underwent right femoral endarterectomy on 04/03/2020 with Dr. Darrick Penna and left-sided endarterectomy is also planned.  MRI scan of the brain obtained in the hospital was negative for acute infarct and showed small changes of small vessel disease CT angiogram showed no significant extracranial intracranial large vessel stenosis or occlusion.  LDL cholesterol 57 mg percent and hemoglobin A1c was 5.7.  Transthoracic echo showed normal ejection fraction without cardiac source of embolism.  Outpatient cardiac Holter monitoring showed no significant cardiac arrhythmias.  Patient states he has had episodes of hypotension which are manifested with blurred vision and feeling tired and fuzzy headed.  The has noticed this happen multiple times when his blood pressure has been below 100 systolic.  He does take Maxide about 2 or 3 days a week when his blood pressure is running in the 150s.  He seems to be exquisitely sensitive to this.  He plans to discuss with his primary care physician alternative blood pressure management strategies.  He admits to not drinking enough fluids.  He was scheduled to undergo dental surgery soon which she hopes will solve his problems.  He has had no recurrent stroke or TIA symptoms.  He remains on Crestor 20 mg he  is tolerating well without muscle aches and pains.     ROS:   14 system review of systems is positive for those listed in HPI and all other systems negative  PMH:  Past Medical History:  Diagnosis Date   BRADYCARDIA 06/01/2007   Cataract    bil cataracts removed   COPD (chronic obstructive pulmonary disease) (HCC)    EMPHYSEMA 03/28/2008   Emphysema of lung (HCC)    GERD (gastroesophageal reflux disease)    HYPERGLYCEMIA 06/01/2007   HYPERLIPIDEMIA 06/01/2007   Hypertension    HYPERTENSION 01/19/2009   Peripheral arterial disease (HCC)    TIA (transient ischemic attack)     Social History:  Social History   Socioeconomic History   Marital status: Widowed    Spouse name: Not on file   Number of children: Not on file   Years of education: Not on file   Highest education level: Not on file  Occupational History   Occupation: Retired   Tobacco Use   Smoking  status: Former    Packs/day: 0.50    Years: 40.00    Total pack years: 20.00    Types: Cigarettes    Quit date: 09/26/2017    Years since quitting: 4.9   Smokeless tobacco: Never   Tobacco comments:    stopped 2018  Vaping Use   Vaping Use: Never used  Substance and Sexual Activity   Alcohol use: No   Drug use: No   Sexual activity: Not on file  Other Topics Concern   Not on file  Social History Narrative   Lives at home with wife   Right handed   Caffeine: drinks de-caf drinks, diet drinks if its a coke.   Social Determinants of Health   Financial Resource Strain: Not on file  Food Insecurity: Not on file  Transportation Needs: Not on file  Physical Activity: Not on file  Stress: Not on file  Social Connections: Not on file  Intimate Partner Violence: Not on file    Medications:   Current Outpatient Medications on File Prior to Visit  Medication Sig Dispense Refill   aspirin 81 MG tablet Take 81 mg by mouth daily.      celecoxib (CELEBREX) 200 MG capsule Take 1 capsule (200 mg total) by mouth 2  (two) times daily as needed. 60 capsule 5   cyanocobalamin 2000 MCG tablet Take 2,000 mcg by mouth daily.     EPINEPHrine (EPIPEN 2-PAK) 0.3 mg/0.3 mL IJ SOAJ injection Inject 0.3 mLs (0.3 mg total) into the muscle as needed for anaphylaxis. 1 each 0   fludrocortisone (FLORINEF) 0.1 MG tablet Take 1 tablet by mouth three days per week only 30 tablet 3   fluticasone (FLONASE) 50 MCG/ACT nasal spray Place 2 sprays into both nostrils daily as needed for allergies or rhinitis.     fluticasone-salmeterol (ADVAIR) 250-50 MCG/ACT AEPB Inhale 1 puff into the lungs every 12 (twelve) hours. 60 each 11   midodrine (PROAMATINE) 5 MG tablet Take 1 tablet (5 mg total) by mouth 3 (three) times daily with meals. Take 1 tablet at 8am, 12 noon and 4pm 270 tablet 1   rosuvastatin (CRESTOR) 20 MG tablet TAKE 1 TABLET(20 MG) BY MOUTH DAILY AT 6 PM 90 tablet 0   No current facility-administered medications on file prior to visit.    Allergies:   Allergies  Allergen Reactions   Bee Venom Anaphylaxis    Yellow jackets   Meclizine     Fatigue     Physical Exam Today's Vitals   09/24/22 1405  BP: 123/73  Pulse: 67  Weight: 211 lb (95.7 kg)  Height: 5\' 11"  (1.803 m)   Body mass index is 29.43 kg/m.    General: well developed, well nourished pleasant elderly Caucasian male, seated, in no evident distress Head: head normocephalic and atraumatic.  Neck: supple with no carotid or supraclavicular bruits Cardiovascular: regular rate and rhythm, no murmurs Musculoskeletal: no deformity Skin:  no rash/petichiae Vascular:  Normal pulses all extremities  Neurologic Exam Mental Status: Awake and fully alert.  Fluent speech and language.  Oriented to place and time. Recent and remote memory intact. Attention span, concentration and fund of knowledge appropriate. Mood and affect appropriate.  Cranial Nerves: Pupils equal, briskly reactive to light. Extraocular movements full without nystagmus. Visual fields full  to confrontation. Hearing slightly diminished bilaterally. Facial sensation intact. Face, tongue, palate moves normally and symmetrically.  Motor: Normal bulk and tone. Normal strength in all tested extremity muscles  Sensory.: intact to  touch ,pinprick .position and vibratory sensation.  Coordination: Rapid alternating movements normal in all extremities. Finger-to-nose and heel-to-shin performed accurately bilaterally. Gait and Station: Arises from chair without difficulty. Stance is slightly hunched. Gait demonstrates broad-based gait with slightly decreased stride length bilaterally without use of assistive device.  Tandem walk and heel toe not attempted Reflexes: 1+ and symmetric. Toes downgoing.       ASSESSMENT/PLAN: 87 year old male with posterior circulation TIA in July 2021 from small vessel disease.  Vascular risk factors of hypertension hyperlipidemia, peripheral arterial disease, BPPV and age.  Longstanding history of BPPV which has been stable.  Evidence of severe sensory neuropathy per EMG/NCV 10/2021 with significant B12 deficiency and improvement of symptoms after correction.     TIA : Continue aspirin 81 mg daily  and Crestor for secondary stroke prevention.  Discussed secondary stroke prevention measures and importance of close PCP f/u for aggressive stroke risk factor management  Visual concerns: Unknown etiology but occurs with hypotension. Feels issue with eye movement. Continue to follow with ophthalmology Dr. 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   Did have significant B12 deficiency now corrected which may be contributing.   Notes continued improvement since correction of B12 deficiency.   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  Continue HEP as advised by therapy.  Discussed fall precautions     Follow-up in 6 months or call earlier if  needed    CC:  Ardith Dark, MD   I spent 36 minutes of face-to-face and non-face-to-face time with patient.  This included previsit chart review, lab review, study review, order entry, electronic health record documentation, patient education and discussion regarding above diagnoses and treatment plan and answered all the questions to patient's satisfaction  Ihor Austin, [facility]  [facility] [address] [place], [place] [address]  Phone [phone] Fax [phone] Note: This document was prepared with digital dictation and possible smart phrase technology. Any transcriptional errors that result from this process are unintentional.

## 2022-09-24 ENCOUNTER — Encounter: Payer: Self-pay | Admitting: Adult Health

## 2022-09-24 ENCOUNTER — Ambulatory Visit: Payer: Medicare Other | Admitting: Adult Health

## 2022-09-24 VITALS — BP 123/73 | HR 67 | Ht 71.0 in | Wt 211.0 lb

## 2022-09-24 DIAGNOSIS — E538 Deficiency of other specified B group vitamins: Secondary | ICD-10-CM | POA: Diagnosis not present

## 2022-09-24 DIAGNOSIS — R2689 Other abnormalities of gait and mobility: Secondary | ICD-10-CM | POA: Diagnosis not present

## 2022-09-24 DIAGNOSIS — H539 Unspecified visual disturbance: Secondary | ICD-10-CM | POA: Diagnosis not present

## 2022-09-24 DIAGNOSIS — G629 Polyneuropathy, unspecified: Secondary | ICD-10-CM

## 2022-09-24 DIAGNOSIS — G459 Transient cerebral ischemic attack, unspecified: Secondary | ICD-10-CM | POA: Diagnosis not present

## 2022-09-24 NOTE — Patient Instructions (Signed)
Continue aspirin 81 mg daily  and Crestor for secondary stroke prevention  Continue to follow with Dr. Katy Fitch for routine vision exams  Continue to follow with cardiology as scheduled for hypotension and medication management  Continue to follow up with PCP regarding blood pressure and cholesterol management  Maintain strict control of hypertension with blood pressure goal below 130/90 and cholesterol with LDL cholesterol (bad cholesterol) goal below 70 mg/dL.   Signs of a Stroke? Follow the BEFAST method:  Balance Watch for a sudden loss of balance, trouble with coordination or vertigo Eyes Is there a sudden loss of vision in one or both eyes? Or double vision?  Face: Ask the person to smile. Does one side of the face droop or is it numb?  Arms: Ask the person to raise both arms. Does one arm drift downward? Is there weakness or numbness of a leg? Speech: Ask the person to repeat a simple phrase. Does the speech sound slurred/strange? Is the person confused ? Time: If you observe any of these signs, call 911.    Followup in the future with Dr. Leonie Man in 6 months or call earlier if needed     Thank you for coming to see [place] at [facility]. I hope we have been able to provide you high quality care today.  You may receive a patient satisfaction survey over the next few weeks. We would appreciate your feedback and comments so that we may continue to improve ourselves and the health of our patients.

## 2022-09-29 ENCOUNTER — Inpatient Hospital Stay: Payer: Medicare Other | Attending: Oncology

## 2022-09-29 ENCOUNTER — Inpatient Hospital Stay: Payer: Medicare Other | Admitting: Oncology

## 2022-09-29 VITALS — BP 122/73 | HR 61 | Temp 98.2°F | Resp 20 | Ht 71.0 in | Wt 211.8 lb

## 2022-09-29 DIAGNOSIS — D472 Monoclonal gammopathy: Secondary | ICD-10-CM

## 2022-09-29 DIAGNOSIS — J449 Chronic obstructive pulmonary disease, unspecified: Secondary | ICD-10-CM | POA: Diagnosis not present

## 2022-09-29 DIAGNOSIS — E538 Deficiency of other specified B group vitamins: Secondary | ICD-10-CM | POA: Diagnosis not present

## 2022-09-29 DIAGNOSIS — Z79899 Other long term (current) drug therapy: Secondary | ICD-10-CM | POA: Insufficient documentation

## 2022-09-29 DIAGNOSIS — I951 Orthostatic hypotension: Secondary | ICD-10-CM | POA: Diagnosis not present

## 2022-09-29 LAB — CMP (CANCER CENTER ONLY)
ALT: 17 U/L (ref 0–44)
AST: 18 U/L (ref 15–41)
Albumin: 4.2 g/dL (ref 3.5–5.0)
Alkaline Phosphatase: 46 U/L (ref 38–126)
Anion gap: 7 (ref 5–15)
BUN: 23 mg/dL (ref 8–23)
CO2: 31 mmol/L (ref 22–32)
Calcium: 9.4 mg/dL (ref 8.9–10.3)
Chloride: 104 mmol/L (ref 98–111)
Creatinine: 0.92 mg/dL (ref 0.61–1.24)
GFR, Estimated: >60 mL/min (ref >60)
Glucose, Bld: 114 mg/dL — ABNORMAL HIGH (ref 70–99)
Potassium: 4.7 mmol/L (ref 3.5–5.1)
Sodium: 142 mmol/L (ref 135–145)
Total Bilirubin: 0.6 mg/dL (ref 0.3–1.2)
Total Protein: 6.9 g/dL (ref 6.5–8.1)

## 2022-09-29 LAB — CBC WITH DIFFERENTIAL (CANCER CENTER ONLY)
Abs Immature Granulocytes: 0.01 10*3/uL (ref 0.00–0.07)
Basophils Absolute: 0.1 10*3/uL (ref 0.0–0.1)
Basophils Relative: 1 %
Eosinophils Absolute: 0.4 10*3/uL (ref 0.0–0.5)
Eosinophils Relative: 6 %
HCT: 43.3 % (ref 39.0–52.0)
Hemoglobin: 14.1 g/dL (ref 13.0–17.0)
Immature Granulocytes: 0 %
Lymphocytes Relative: 20 %
Lymphs Abs: 1.5 10*3/uL (ref 0.7–4.0)
MCH: 29.3 pg (ref 26.0–34.0)
MCHC: 32.6 g/dL (ref 30.0–36.0)
MCV: 90 fL (ref 80.0–100.0)
Monocytes Absolute: 0.5 10*3/uL (ref 0.1–1.0)
Monocytes Relative: 6 %
Neutro Abs: 5.3 10*3/uL (ref 1.7–7.7)
Neutrophils Relative %: 67 %
Platelet Count: 175 10*3/uL (ref 150–400)
RBC: 4.81 MIL/uL (ref 4.22–5.81)
RDW: 13 % (ref 11.5–15.5)
WBC Count: 7.8 10*3/uL (ref 4.0–10.5)
nRBC: 0 % (ref 0.0–0.2)

## 2022-09-29 NOTE — Progress Notes (Signed)
  [facility] OFFICE PROGRESS NOTE   Diagnosis: Serum monoclonal protein  INTERVAL HISTORY:   Mr Harbeson returns as scheduled.  He feels well.  He has neuropathy symptoms in the feet at night.  No fever or night sweats.  No recent infection.  Objective:  Vital signs in last 24 hours:  Blood pressure 122/73, pulse 61, temperature 98.2 F (36.8 C), resp. rate 20, height _0  (1.803 m), weight 211 lb 12.8 oz (96.1 kg), SpO2 100 %.     Lymphatics: No cervical, supraclavicular, axillary, or inguinal nodes, firm areas in the bilateral groin and apparent surgical sites Resp: Lungs clear bilaterally Cardio: Regular rate and rhythm GI: No hepatosplenomegaly Vascular: No leg edema  Lab Results:  Lab Results  Component Value Date   WBC 7.8 09/29/2022   HGB 14.1 09/29/2022   HCT 43.3 09/29/2022   MCV 90.0 09/29/2022   PLT 175 09/29/2022   NEUTROABS 5.3 09/29/2022    CMP  Lab Results  Component Value Date   NA 142 09/29/2022   K 4.7 09/29/2022   CL 104 09/29/2022   CO2 31 09/29/2022   GLUCOSE 114 (H) 09/29/2022   BUN 23 09/29/2022   CREATININE 0.92 09/29/2022   CALCIUM 9.4 09/29/2022   PROT 6.9 09/29/2022   ALBUMIN 4.2 09/29/2022   AST 18 09/29/2022   ALT 17 09/29/2022   ALKPHOS 46 09/29/2022   BILITOT 0.6 09/29/2022   GFRNONAA >60 09/29/2022   GFRAA >60 04/18/2020     Medications: I have reviewed the patient's current medications.   Assessment/Plan: Serum M spike 0.4 g/dL 12/09/2021 for IgG kappa 01/22/2022 hemoglobin 13.3, white count 7.4, platelet count 158,000; quantitative immunoglobulins in normal range; kappa free light chains 31.8 (3.3-19.4) B12 deficiency, on replacement PVD Orthostatic hypotension COPD    Disposition: Bryan Armstrong has a serum monoclonal IgG kappa protein.  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  There is no clinical or laboratory evidence for progression to multiple myeloma.  We will  follow-up on the serum monoclonal protein level from today.  He will return for an office and lab visit in 9 months.  Betsy Coder, MD  09/29/2022  3:06 PM

## 2022-10-01 LAB — PROTEIN ELECTROPHORESIS, SERUM
A/G Ratio: 1.3 (ref 0.7–1.7)
Albumin ELP: 3.6 g/dL (ref 2.9–4.4)
Alpha-1-Globulin: 0.2 g/dL (ref 0.0–0.4)
Alpha-2-Globulin: 0.7 g/dL (ref 0.4–1.0)
Beta Globulin: 0.8 g/dL (ref 0.7–1.3)
Gamma Globulin: 1 g/dL (ref 0.4–1.8)
Globulin, Total: 2.7 g/dL (ref 2.2–3.9)
M-Spike, %: 0.4 g/dL — ABNORMAL HIGH
Total Protein ELP: 6.3 g/dL (ref 6.0–8.5)

## 2022-10-02 ENCOUNTER — Encounter: Payer: Self-pay | Admitting: *Deleted

## 2022-10-02 NOTE — Progress Notes (Signed)
Mailed his 12/18 lab results to home as requested.

## 2022-11-10 ENCOUNTER — Other Ambulatory Visit: Payer: Self-pay | Admitting: *Deleted

## 2022-11-10 DIAGNOSIS — I739 Peripheral vascular disease, unspecified: Secondary | ICD-10-CM

## 2022-11-17 DIAGNOSIS — M545 Low back pain, unspecified: Secondary | ICD-10-CM | POA: Diagnosis not present

## 2022-11-17 DIAGNOSIS — M25551 Pain in right hip: Secondary | ICD-10-CM | POA: Diagnosis not present

## 2022-11-21 ENCOUNTER — Ambulatory Visit: Payer: Medicare Other

## 2022-11-21 ENCOUNTER — Ambulatory Visit (HOSPITAL_COMMUNITY): Payer: Medicare Other

## 2022-11-29 DIAGNOSIS — M25551 Pain in right hip: Secondary | ICD-10-CM | POA: Diagnosis not present

## 2022-12-02 DIAGNOSIS — M25551 Pain in right hip: Secondary | ICD-10-CM | POA: Diagnosis not present

## 2022-12-10 ENCOUNTER — Encounter (INDEPENDENT_AMBULATORY_CARE_PROVIDER_SITE_OTHER): Payer: Medicare Other | Admitting: Ophthalmology

## 2022-12-15 ENCOUNTER — Telehealth: Payer: Self-pay | Admitting: Adult Health

## 2022-12-15 NOTE — Telephone Encounter (Signed)
Pt asking with neuropathy in his leg should he have a knee replacement. Would like a call from the nurse after 4 pm.

## 2022-12-15 NOTE — Telephone Encounter (Signed)
Contacted pt, LVM rq CB

## 2022-12-16 NOTE — Telephone Encounter (Signed)
Contacted pt again, LVM rq CB

## 2022-12-18 NOTE — Telephone Encounter (Signed)
Contacted pt back, informed him from a neurologic standpoint, there are no concerns with him getting a knee replacement because of the neuropathy. Pt verbally understood and was appreciative.

## 2022-12-18 NOTE — Telephone Encounter (Signed)
Pt would like a call to discuss Jessica's thoughts on him having knee replacement with him having neuropathy

## 2022-12-25 ENCOUNTER — Other Ambulatory Visit: Payer: Self-pay | Admitting: *Deleted

## 2022-12-25 ENCOUNTER — Encounter: Payer: Self-pay | Admitting: Family Medicine

## 2022-12-25 ENCOUNTER — Ambulatory Visit (INDEPENDENT_AMBULATORY_CARE_PROVIDER_SITE_OTHER): Payer: Medicare Other | Admitting: Family Medicine

## 2022-12-25 VITALS — BP 120/70 | HR 65 | Temp 97.8°F | Ht 71.0 in | Wt 206.6 lb

## 2022-12-25 DIAGNOSIS — M1990 Unspecified osteoarthritis, unspecified site: Secondary | ICD-10-CM

## 2022-12-25 DIAGNOSIS — I952 Hypotension due to drugs: Secondary | ICD-10-CM | POA: Diagnosis not present

## 2022-12-25 DIAGNOSIS — M25551 Pain in right hip: Secondary | ICD-10-CM | POA: Diagnosis not present

## 2022-12-25 MED ORDER — ROSUVASTATIN CALCIUM 20 MG PO TABS: ORAL_TABLET | ORAL | 1 refills | Status: DC

## 2022-12-25 NOTE — Assessment & Plan Note (Signed)
>>  ASSESSMENT AND PLAN FOR HYPOTENSION WRITTEN ON 12/25/2022  1:57 PM BY Eulia Hatcher M, MD  Blood pressure at goal today.  Follows with cardiology.  He is on midodrine  5 mg 3 times daily and fludrocortisone  0.1 mg 3 times weekly.

## 2022-12-25 NOTE — Progress Notes (Signed)
   Bryan Armstrong is a 88 y.o. male who presents today for an office visit.  Assessment/Plan:  New/Acute Problems: Right Hip Pain Concern for muscular strain. He wants to see Raliegh Ip for second opinion. Will place referral today. He can continue using our Profen as needed.  Recommended that he take with Pepcid to prevent gastritis or PUD.  Chronic Problems Addressed Today: Osteoarthritis Follows with orthopedics as above.  Celebrex gives modest benefit though he does like ibuprofen typically works better.  Discussed with patient he can use ibuprofen sparingly as needed however he should not mix with the Celebrex.  Recommend he take with Pepcid for GI protection.  Hypotension Blood pressure at goal today.  Follows with cardiology.  He is on midodrine 5 mg 3 times daily and fludrocortisone 0.1 mg 3 times weekly.     Subjective:  HPI:  See A/p for status of chronic conditions.  His main concern today is right hip pain.About 6-8 weeks ago he was working on his golf swing and he started work with [employer]. He forgot to reset the weight and on his first attempt he noticed significant pain to his right hip. Went to his orthoepdics  and they got an xray which was negative. He was told that he probably tore a muscle. He would like a second opinion. Celebrex helps modestly but he hs noticed that ibuprofen helps more.        Objective:  Physical Exam: BP 120/70   Pulse 65   Temp 97.8 F (36.6 C) (Temporal)   Ht 5\' 11"  (1.803 m)   Wt 206 lb 9.6 oz (93.7 kg)   SpO2 97%   BMI 28.81 kg/m   Gen: No acute distress, resting comfortably Neuro: Grossly normal, moves all extremities Psych: Normal affect and thought content      Symia Herdt M. Jerline Pain, MD 12/25/2022 1:58 PM

## 2022-12-25 NOTE — Assessment & Plan Note (Signed)
Follows with orthopedics as above.  Celebrex gives modest benefit though he does like ibuprofen typically works better.  Discussed with patient he can use ibuprofen sparingly as needed however he should not mix with the Celebrex.  Recommend he take with Pepcid for GI protection.

## 2022-12-25 NOTE — Assessment & Plan Note (Signed)
Blood pressure at goal today.  Follows with cardiology.  He is on midodrine 5 mg 3 times daily and fludrocortisone 0.1 mg 3 times weekly.

## 2022-12-25 NOTE — Patient Instructions (Addendum)
It was very nice to see you today!  I will refer you to Raliegh Ip.   You can take pepcid with ibuprofen to help prevent stomach inflammation and ulcers.   We will see you back later this year once you are do for your physical. Come back sooner if needed.   Take care, Dr Jerline Pain  PLEASE NOTE:  If you had any lab tests, please let us know if you have not heard back within a few days. You may see your results on mychart before we have a chance to review them but we will give you a call once they are reviewed by [place].   If we ordered any referrals today, please let us know if you have not heard from their office within the next week.   If you had any urgent prescriptions sent in today, please check with the pharmacy within an hour of our visit to make sure the prescription was transmitted appropriately.   Please try these tips to maintain a healthy lifestyle:  Eat at least 3 REAL meals and 1-2 snacks per day.  Aim for no more than 5 hours between eating.  If you eat breakfast, please do so within one hour of getting up.   Each meal should contain half fruits/vegetables, one quarter protein, and one quarter carbs (no bigger than a computer mouse)  Cut down on sweet beverages. This includes juice, soda, and sweet tea.   Drink at least 1 glass of water with each meal and aim for at least 8 glasses per day  Exercise at least 150 minutes every week.

## 2022-12-29 ENCOUNTER — Other Ambulatory Visit: Payer: Self-pay | Admitting: Internal Medicine

## 2022-12-30 DIAGNOSIS — M25551 Pain in right hip: Secondary | ICD-10-CM | POA: Diagnosis not present

## 2023-01-13 ENCOUNTER — Telehealth: Payer: Self-pay | Admitting: Family Medicine

## 2023-01-13 ENCOUNTER — Encounter: Payer: Self-pay | Admitting: Internal Medicine

## 2023-01-13 ENCOUNTER — Other Ambulatory Visit: Payer: Self-pay

## 2023-01-13 ENCOUNTER — Ambulatory Visit: Payer: Medicare Other | Attending: Internal Medicine | Admitting: Internal Medicine

## 2023-01-13 VITALS — BP 130/80 | HR 73 | Ht 71.0 in | Wt 205.0 lb

## 2023-01-13 DIAGNOSIS — I951 Orthostatic hypotension: Secondary | ICD-10-CM

## 2023-01-13 DIAGNOSIS — Z79899 Other long term (current) drug therapy: Secondary | ICD-10-CM | POA: Diagnosis not present

## 2023-01-13 DIAGNOSIS — E269 Hyperaldosteronism, unspecified: Secondary | ICD-10-CM

## 2023-01-13 DIAGNOSIS — R42 Dizziness and giddiness: Secondary | ICD-10-CM

## 2023-01-13 MED ORDER — FLUDROCORTISONE ACETATE 0.1 MG PO TABS
ORAL_TABLET | ORAL | 3 refills | Status: DC
Start: 2023-01-13 — End: 2023-01-13

## 2023-01-13 MED ORDER — FLUDROCORTISONE ACETATE 0.1 MG PO TABS
0.1000 mg | ORAL_TABLET | Freq: Every day | ORAL | 3 refills | Status: DC
Start: 2023-01-13 — End: 2023-09-17

## 2023-01-13 NOTE — Telephone Encounter (Signed)
Referral placed to ENT for vertigo

## 2023-01-13 NOTE — Patient Instructions (Addendum)
Medication Instructions:   ** Restart Florinef 0.1mg  - 1 tablet by mouth daily  *If you need a refill on your cardiac medications before your next appointment, please call your pharmacy*   Lab Work: [facility] in 2 weeks If you have labs (blood work) drawn today and your tests are completely normal, you will receive your results only by: [place] (if you have MyChart) OR A paper copy in the mail If you have any lab test that is abnormal or we need to change your treatment, we will call you to review the results.   Testing/Procedures: None ordered.    Follow-Up: At [facility], you and your health needs are our priority.  As part of our continuing mission to provide you with exceptional heart care, we have created designated Provider Care Teams.  These Care Teams include your primary Cardiologist (physician) and Advanced Practice Providers (APPs -  Physician Assistants and Nurse Practitioners) who all work together to provide you with the care you need, when you need it.  We recommend signing up for the patient portal called "MyChart".  Sign up information is provided on this After Visit Summary.  MyChart is used to connect with patients for Virtual Visits (Telemedicine).  Patients are able to view lab/test results, encounter notes, upcoming appointments, etc.  Non-urgent messages can be sent to your provider as well.   To learn more about what you can do with MyChart, go to [email or web address].    Your next appointment:   2 weeks with Oda Kilts, PA-C  Dr Caryl Comes in 6 months  Compression shorts as discussed by Dr Alfonse Spruce - Dr Olin Pia scheduler [phone]

## 2023-01-13 NOTE — Telephone Encounter (Signed)
Pt would like a referral for ENT  for vertigo - stated he went to [facility] neuro and he is done but needs to be seen for vertigo- He was seen by Dr Lenny Pastel who I sno longer accepting [facility] -   Pt would like referral to be sent to [facility]  Ear nose and throat [address].- pt states he does not need to see Dr Jerline Pain.-   Please advise-.

## 2023-01-13 NOTE — Progress Notes (Signed)
Patient Care Team: Vivi Barrack, MD as PCP - General (Family Medicine) Sherren Mocha, MD as PCP - Cardiology (Cardiology) Riki Rusk, MD (Internal Medicine)   HPI  Bryan Armstrong is a 88 y.o. male Seen in follow-up for orthostatic hypotension in the setting of peripheral neuropathy and gait disturbance and remote right bundle branch block.  Currently treated with ProAmatine  At the last visit, we increase his ProAmatine to 2.5--5 3 times daily.  Significant interval improvement.  However, some worsening symptoms this summer,  fludrocortisone added per Dr. [facility], at 0.1 mg daily.  Associated with fluid retention.  About 5 to 10 days after this was initiated he had an episode of presyncope at the church walking in.  Otherwise his lightheadedness has been much improved on the fludrocortisone The patient denies chest pain, shortness of breath, nocturnal dyspnea, orthopnea or peripheral [place].  There have been no palpitations.  Complains of lightheadedness and dizziness distinct from his prior history of vertigo which accompanies gait instability and effort intolerance.   DATE TEST EF    10/12 Echo  60-65 %    7/21 Echo  60-65 %               Date Cr K Hgb  11/21 1.41 4.0 13.8  6/22 1.15 4.3 13.8 (5/22)  7/23 1.07 (8/23) 4.0 14.8       Records and Results Reviewed   Past Medical History:  Diagnosis Date   BRADYCARDIA 06/01/2007   Cataract    bil cataracts removed   COPD (chronic obstructive pulmonary disease)    EMPHYSEMA 03/28/2008   Emphysema of lung    GERD (gastroesophageal reflux disease)    HYPERGLYCEMIA 06/01/2007   HYPERLIPIDEMIA 06/01/2007   Hypertension    HYPERTENSION 01/19/2009   Peripheral arterial disease    TIA (transient ischemic attack)     Past Surgical History:  Procedure Laterality Date   ABDOMINAL AORTOGRAM W/LOWER EXTREMITY N/A 03/05/2020   Procedure: ABDOMINAL AORTOGRAM W/  LOWER EXTREMITY;  Surgeon: Lorretta Harp, MD;  Location: [facility];  Service: Cardiovascular;  Laterality: N/A;   CATARACT EXTRACTION, BILATERAL     COLONOSCOPY     ENDARTERECTOMY FEMORAL Right 04/03/2020   Procedure: Right Femoral Endarterectomy;  Surgeon: Elam Dutch, MD;  Location: [facility];  Service: Vascular;  Laterality: Right;   ENDARTERECTOMY FEMORAL Left 09/10/2020   Procedure: ENDARTERECTOMY FEMORAL LEFT;  Surgeon: Elam Dutch, MD;  Location: [place];  Service: Vascular;  Laterality: Left;   EYE SURGERY     Bilaterl cataracts   PATCH ANGIOPLASTY Left 09/10/2020   Procedure: PATCH ANGIOPLASTY OF LEFT FEMORAL ARTERY USING HEMASHIELD PLATINUM FINESSE PATCH;  Surgeon: Elam Dutch, MD;  Location: [facility];  Service: Vascular;  Laterality: Left;   POLYPECTOMY     TONSILLECTOMY AND ADENOIDECTOMY      Current Meds  Medication Sig   aspirin 81 MG tablet Take 81 mg by mouth daily.    cyanocobalamin 2000 MCG tablet Take 1,000 mcg by mouth daily.   EPINEPHrine (EPIPEN 2-PAK) 0.3 mg/0.3 mL IJ SOAJ injection Inject 0.3 mLs (0.3 mg total) into the muscle as needed for anaphylaxis.   fluticasone (FLONASE) 50 MCG/ACT nasal spray Place 2 sprays into both nostrils daily as needed for allergies or rhinitis.   fluticasone-salmeterol (ADVAIR) 250-50 MCG/ACT AEPB Inhale 1 puff into the lungs every 12 (twelve) hours.   midodrine (PROAMATINE) 5 MG tablet TAKE 1 TABLET BY  MOUTH THREE TIMES DAILY WITH MEALS. TAKE AT 8AM, 12PM, AND 4PM (Patient taking differently: Take 5 mg by mouth 2 (two) times daily.)   rosuvastatin (CRESTOR) 20 MG tablet TAKE 1 TABLET(20 MG) BY MOUTH DAILY AT 6 PM   [DISCONTINUED] celecoxib (CELEBREX) 200 MG capsule Take 1 capsule (200 mg total) by mouth 2 (two) times daily as needed.   [DISCONTINUED] fludrocortisone (FLORINEF) 0.1 MG tablet Take 1 tablet by mouth three days per week only    Allergies  Allergen Reactions   Bee Venom Anaphylaxis    Yellow jackets   Meclizine     Fatigue       Review of Systems  negative except from HPI and PMH  Physical Exam BP (!) 80/52 (Patient Position: Standing)   Pulse 73   Ht 5\' 11"  (1.803 m)   Wt 205 lb (93 kg)   SpO2 93%   BMI 28.59 kg/m  Well developed and nourished in no acute distress HENT normal Neck supple with JVP-  flat   Clear Regular rate and rhythm, no murmurs or gallops Abd-soft with active BS No Clubbing cyanosis edema Skin-warm and dry A & Oriented  Grossly normal sensory and motor function  ECG sinus at 62 120/14/43 Right bundle branch block   CrCl cannot be calculated (Patient's most recent lab result is older than the maximum 21 days allowed.).   Assessment and  Plan  Orthostatic hypotension severe   Diagnosis of vertigo, vasovagal syncope, ataxia   Peripheral vascular disease with prior lower extremity revascularization   RBBB-old   Neuropathy   Worsening orthostatic hypotension of late.  He had discontinued his fludrocortisone because it was not "working "taking his ProAmatine a couple of times a day we have reviewed again the physiology of orthostatic hypotension as we noted on repeat test that his blood pressure went from 130 sitting to 80 standing.  He will resume his fludrocortisone at 0.1 mg daily, this is up from 3 times per week which has been the target dose because of volume retention.  We may have to back down.  Will also have him take his ProAmatine 3 times a day at 9, 3 and 7 PM.  Encouraged him to get compression shorts.  Stressed the importance of being attentive to this so as to avoid falls

## 2023-01-26 NOTE — Progress Notes (Unsigned)
  Electrophysiology Office Note:   Date:  01/27/2023  ID:  Bryan Armstrong, DOB 1934-12-13, MRN [id number]  Primary Cardiologist: Tonny Bollman, MD Electrophysiologist: Sherryl Manges, MD   History of Present Illness:   Bryan Armstrong is a 88 y.o. male with h/o orthostatic hypotension in the setting of peripheral neuropathy and gait disturbance, RBBB seen today for routine electrophysiology followup.   Seen by Dr. Graciela Husbands 01/13/2023 and had stopped his fludrocortisone because it was "not working". Was taking midodrine "a couple of times a day".   Since last being seen in our clinic the patient reports doing about the same, maybe a little better. BP has been relatively stable, and orthostatics are better today than in the past.  He still has episode ~ once a day, but sometimes as short as 2-3 minutes. No symptoms today with orthostatics.  He wasn't sure when to take fludrocortisone, so has been trying different times of day; no real difference on his end symptom wise.   Review of systems complete and found to be negative unless listed in HPI.   Studies Reviewed:    EKG is not ordered today  Risk Assessment/Calculations:     Physical Exam:   VS:  BP (!) 142/82   Pulse 61   Ht 5\' 11"  (1.803 m)   Wt 205 lb 9.6 oz (93.3 kg)   SpO2 95%   BMI 28.68 kg/m    Wt Readings from Last 3 Encounters:  01/27/23 205 lb 9.6 oz (93.3 kg)  01/13/23 205 lb (93 kg)  12/25/22 206 lb 9.6 oz (93.7 kg)     GEN: Well nourished, well developed in no acute distress NECK: No JVD; No carotid bruits CARDIAC: Regular rate and rhythm, no murmurs, rubs, gallops RESPIRATORY:  Clear to auscultation without rales, wheezing or rhonchi  ABDOMEN: Soft, non-tender, non-distended EXTREMITIES:  No edema; No deformity   ASSESSMENT AND PLAN:    Orthostatic hypotension severe   Diagnosis of vertigo, vasovagal syncope, ataxia   Peripheral vascular disease with prior lower extremity revascularization   RBBB-old    Neuropathy  Continue fludrocortisone 0.1 mg daily. Recommended he take in morning or early afternoon, preceeding the period where he is most active ; Would not take in evening.   He will let us know if he has any issues with fluid retention.   He is wearing compression shorts for the first time today. Encouraged him to continue.   Continue midodrine 5 mg TID during waking hours. Discussed if he is going to be particularly active, he could consider taking an extra 2.5-5 mg to help cover those periods.   Follow up with Dr. Graciela Husbands in 3 months  Signed, Graciella Freer, PA-C

## 2023-01-27 ENCOUNTER — Ambulatory Visit: Payer: Medicare Other

## 2023-01-27 ENCOUNTER — Encounter: Payer: Self-pay | Admitting: Student

## 2023-01-27 ENCOUNTER — Ambulatory Visit: Payer: Medicare Other | Attending: Student | Admitting: Student

## 2023-01-27 VITALS — BP 142/82 | HR 61 | Ht 71.0 in | Wt 205.6 lb

## 2023-01-27 DIAGNOSIS — R55 Syncope and collapse: Secondary | ICD-10-CM

## 2023-01-27 DIAGNOSIS — I951 Orthostatic hypotension: Secondary | ICD-10-CM | POA: Diagnosis not present

## 2023-01-27 DIAGNOSIS — Z79899 Other long term (current) drug therapy: Secondary | ICD-10-CM | POA: Diagnosis not present

## 2023-01-27 NOTE — Patient Instructions (Signed)
Medication Instructions:  Your physician recommends that you continue on your current medications as directed. Please refer to the Current Medication list given to you today.  *If you need a refill on your cardiac medications before your next appointment, please call your pharmacy*  Lab Work: None ordered If you have labs (blood work) drawn today and your tests are completely normal, you will receive your results only by: MyChart Message (if you have MyChart) OR A paper copy in the mail If you have any lab test that is abnormal or we need to change your treatment, we will call you to review the results.  Follow-Up: At [facility], you and your health needs are our priority.  As part of our continuing mission to provide you with exceptional heart care, we have created designated Provider Care Teams.  These Care Teams include your primary Cardiologist (physician) and Advanced Practice Providers (APPs -  Physician Assistants and Nurse Practitioners) who all work together to provide you with the care you need, when you need it.  We recommend signing up for the patient portal called "MyChart".  Sign up information is provided on this After Visit Summary.  MyChart is used to connect with patients for Virtual Visits (Telemedicine).  Patients are able to view lab/test results, encounter notes, upcoming appointments, etc.  Non-urgent messages can be sent to your provider as well.   To learn more about what you can do with MyChart, go to [email or web address].    Your next appointment:   3 month(s)  Provider:   Sherryl Manges, MD

## 2023-01-28 LAB — BASIC METABOLIC PANEL
BUN/Creatinine Ratio: 22 (ref 10–24)
BUN: 24 mg/dL (ref 8–27)
CO2: 24 mmol/L (ref 20–29)
Calcium: 9.2 mg/dL (ref 8.6–10.2)
Chloride: 103 mmol/L (ref 96–106)
Creatinine, Ser: 1.09 mg/dL (ref 0.76–1.27)
Glucose: 77 mg/dL (ref 70–99)
Potassium: 3.9 mmol/L (ref 3.5–5.2)
Sodium: 142 mmol/L (ref 134–144)
eGFR: 66 mL/min/{1.73_m2} (ref >59)

## 2023-01-29 DIAGNOSIS — M1711 Unilateral primary osteoarthritis, right knee: Secondary | ICD-10-CM | POA: Diagnosis not present

## 2023-02-06 ENCOUNTER — Other Ambulatory Visit: Payer: Self-pay | Admitting: Pulmonary Disease

## 2023-02-06 DIAGNOSIS — R2689 Other abnormalities of gait and mobility: Secondary | ICD-10-CM | POA: Diagnosis not present

## 2023-02-06 DIAGNOSIS — R42 Dizziness and giddiness: Secondary | ICD-10-CM | POA: Diagnosis not present

## 2023-02-06 DIAGNOSIS — H6121 Impacted cerumen, right ear: Secondary | ICD-10-CM | POA: Diagnosis not present

## 2023-02-10 DIAGNOSIS — Z961 Presence of intraocular lens: Secondary | ICD-10-CM | POA: Diagnosis not present

## 2023-02-10 DIAGNOSIS — H52223 Regular astigmatism, bilateral: Secondary | ICD-10-CM | POA: Diagnosis not present

## 2023-02-10 DIAGNOSIS — H524 Presbyopia: Secondary | ICD-10-CM | POA: Diagnosis not present

## 2023-02-16 DIAGNOSIS — M25361 Other instability, right knee: Secondary | ICD-10-CM | POA: Diagnosis not present

## 2023-02-17 DIAGNOSIS — R42 Dizziness and giddiness: Secondary | ICD-10-CM | POA: Diagnosis not present

## 2023-02-17 DIAGNOSIS — H903 Sensorineural hearing loss, bilateral: Secondary | ICD-10-CM | POA: Diagnosis not present

## 2023-02-27 ENCOUNTER — Ambulatory Visit (INDEPENDENT_AMBULATORY_CARE_PROVIDER_SITE_OTHER): Payer: Medicare Other | Admitting: Family Medicine

## 2023-02-27 ENCOUNTER — Encounter: Payer: Self-pay | Admitting: Family Medicine

## 2023-02-27 VITALS — BP 131/76 | HR 71 | Temp 97.7°F | Ht 71.0 in | Wt 205.4 lb

## 2023-02-27 DIAGNOSIS — M1990 Unspecified osteoarthritis, unspecified site: Secondary | ICD-10-CM

## 2023-02-27 DIAGNOSIS — E538 Deficiency of other specified B group vitamins: Secondary | ICD-10-CM | POA: Diagnosis not present

## 2023-02-27 DIAGNOSIS — R7309 Other abnormal glucose: Secondary | ICD-10-CM | POA: Diagnosis not present

## 2023-02-27 DIAGNOSIS — E785 Hyperlipidemia, unspecified: Secondary | ICD-10-CM | POA: Diagnosis not present

## 2023-02-27 DIAGNOSIS — I952 Hypotension due to drugs: Secondary | ICD-10-CM | POA: Diagnosis not present

## 2023-02-27 DIAGNOSIS — Z0001 Encounter for general adult medical examination with abnormal findings: Secondary | ICD-10-CM | POA: Diagnosis not present

## 2023-02-27 LAB — HEMOGLOBIN A1C: Hgb A1c MFr Bld: 5.7 % (ref 4.6–6.5)

## 2023-02-27 LAB — CBC
HCT: 40.7 % (ref 39.0–52.0)
Hemoglobin: 13.9 g/dL (ref 13.0–17.0)
MCHC: 34.1 g/dL (ref 30.0–36.0)
MCV: 88 fl (ref 78.0–100.0)
Platelets: 200 10*3/uL (ref 150.0–400.0)
RBC: 4.63 Mil/uL (ref 4.22–5.81)
RDW: 14.6 % (ref 11.5–15.5)
WBC: 7.6 10*3/uL (ref 4.0–10.5)

## 2023-02-27 LAB — LIPID PANEL
Cholesterol: 110 mg/dL (ref 0–200)
HDL: 36 mg/dL — ABNORMAL LOW (ref >39.00)
LDL Cholesterol: 51 mg/dL (ref 0–99)
NonHDL: 73.91
Total CHOL/HDL Ratio: 3
Triglycerides: 116 mg/dL (ref 0.0–149.0)
VLDL: 23.2 mg/dL (ref 0.0–40.0)

## 2023-02-27 LAB — COMPREHENSIVE METABOLIC PANEL
ALT: 15 U/L (ref 0–53)
AST: 18 U/L (ref 0–37)
Albumin: 4.1 g/dL (ref 3.5–5.2)
Alkaline Phosphatase: 50 U/L (ref 39–117)
BUN: 21 mg/dL (ref 6–23)
CO2: 30 mEq/L (ref 19–32)
Calcium: 9 mg/dL (ref 8.4–10.5)
Chloride: 103 mEq/L (ref 96–112)
Creatinine, Ser: 1.16 mg/dL (ref 0.40–1.50)
GFR: 57.14 mL/min — ABNORMAL LOW (ref >60.00)
Glucose, Bld: 105 mg/dL — ABNORMAL HIGH (ref 70–99)
Potassium: 4 mEq/L (ref 3.5–5.1)
Sodium: 139 mEq/L (ref 135–145)
Total Bilirubin: 0.7 mg/dL (ref 0.2–1.2)
Total Protein: 6.8 g/dL (ref 6.0–8.3)

## 2023-02-27 LAB — VITAMIN B12: Vitamin B-12: 622 pg/mL (ref 211–911)

## 2023-02-27 LAB — TSH: TSH: 2.52 u[IU]/mL (ref 0.35–5.50)

## 2023-02-27 NOTE — Patient Instructions (Addendum)
It was very nice to see you today!  We will check blood work today.  Keep working on diet and exercise.   Return in about 1 year (around 02/27/2024) for Annual Physical.   Take care, Dr Jimmey Ralph  PLEASE NOTE:  If you had any lab tests, please let us know if you have not heard back within a few days. You may see your results on mychart before we have a chance to review them but we will give you a call once they are reviewed by [place].   If we ordered any referrals today, please let us know if you have not heard from their office within the next week.   If you had any urgent prescriptions sent in today, please check with the pharmacy within an hour of our visit to make sure the prescription was transmitted appropriately.   Please try these tips to maintain a healthy lifestyle:  Eat at least 3 REAL meals and 1-2 snacks per day.  Aim for no more than 5 hours between eating.  If you eat breakfast, please do so within one hour of getting up.   Each meal should contain half fruits/vegetables, one quarter protein, and one quarter carbs (no bigger than a computer mouse)  Cut down on sweet beverages. This includes juice, soda, and sweet tea.   Drink at least 1 glass of water with each meal and aim for at least 8 glasses per day  Exercise at least 150 minutes every week.    Preventive Care 16 Years and Older, Male Preventive care refers to lifestyle choices and visits with your health care provider that can promote health and wellness. Preventive care visits are also called wellness exams. What can I expect for my preventive care visit? Counseling During your preventive care visit, your health care provider may ask about your: Medical history, including: Past medical problems. Family medical history. History of falls. Current health, including: Emotional well-being. Home life and relationship well-being. Sexual activity. Memory and ability to understand (cognition). Lifestyle,  including: Alcohol, nicotine or tobacco, and drug use. Access to firearms. Diet, exercise, and sleep habits. Work and work Astronomer. Sunscreen use. Safety issues such as seatbelt and bike helmet use. Physical exam Your health care provider will check your: Height and weight. These may be used to calculate your BMI (body mass index). BMI is a measurement that tells if you are at a healthy weight. Waist circumference. This measures the distance around your waistline. This measurement also tells if you are at a healthy weight and may help predict your risk of certain diseases, such as type 2 diabetes and high blood pressure. Heart rate and blood pressure. Body temperature. Skin for abnormal spots. What immunizations do I need?  Vaccines are usually given at various ages, according to a schedule. Your health care provider will recommend vaccines for you based on your age, medical history, and lifestyle or other factors, such as travel or where you work. What tests do I need? Screening Your health care provider may recommend screening tests for certain conditions. This may include: Lipid and cholesterol levels. Diabetes screening. This is done by checking your blood sugar (glucose) after you have not eaten for a while (fasting). Hepatitis C test. Hepatitis B test. HIV (human immunodeficiency virus) test. STI (sexually transmitted infection) testing, if you are at risk. Lung cancer screening. Colorectal cancer screening. Prostate cancer screening. Abdominal aortic aneurysm (AAA) screening. You may need this if you are a current or former smoker. Talk  with your health care provider about your test results, treatment options, and if necessary, the need for more tests. Follow these instructions at home: Eating and drinking  Eat a diet that includes fresh fruits and vegetables, whole grains, lean protein, and low-fat dairy products. Limit your intake of foods with high amounts of sugar,  saturated fats, and salt. Take vitamin and mineral supplements as recommended by your health care provider. Do not drink alcohol if your health care provider tells you not to drink. If you drink alcohol: Limit how much you have to 0-2 drinks a day. Know how much alcohol is in your drink. In the [place] one drink equals one 12 oz bottle of beer (355 mL), one 5 oz glass of wine (148 mL), or one 1 oz glass of hard liquor (44 mL). Lifestyle Brush your teeth every morning and night with fluoride toothpaste. Floss one time each day. Exercise for at least 30 minutes 5 or more days each week. Do not use any products that contain nicotine or tobacco. These products include cigarettes, chewing tobacco, and vaping devices, such as e-cigarettes. If you need help quitting, ask your health care provider. Do not use drugs. If you are sexually active, practice safe sex. Use a condom or other form of protection to prevent STIs. Take aspirin only as told by your health care provider. Make sure that you understand how much to take and what form to take. Work with your health care provider to find out whether it is safe and beneficial for you to take aspirin daily. Ask your health care provider if you need to take a cholesterol-lowering medicine (statin). Find healthy ways to manage stress, such as: Meditation, yoga, or listening to music. Journaling. Talking to a trusted person. Spending time with friends and family. Safety Always wear your seat belt while driving or riding in a vehicle. Do not drive: If you have been drinking alcohol. Do not ride with someone who has been drinking. When you are tired or distracted. While texting. If you have been using any mind-altering substances or drugs. Wear a helmet and other protective equipment during sports activities. If you have firearms in your house, make sure you follow all gun safety procedures. Minimize exposure to UV radiation to reduce your risk of skin  cancer. What's next? Visit your health care provider once a year for an annual wellness visit. Ask your health care provider how often you should have your eyes and teeth checked. Stay up to date on all vaccines. This information is not intended to replace advice given to you by your health care provider. Make sure you discuss any questions you have with your health care provider. Document Revised: 03/27/2021 Document Reviewed: 03/27/2021 Elsevier Patient Education  2023 [employer].

## 2023-02-27 NOTE — Assessment & Plan Note (Signed)
Follows with orthopedics.  Symptoms are stable. 

## 2023-02-27 NOTE — Progress Notes (Signed)
Chief Complaint:  SABASTAIN Armstrong is a 88 y.o. male who presents today for his annual comprehensive physical exam.    Assessment/Plan:  Chronic Problems Addressed Today: Dyslipidemia On crestor 20 mg daily. Tolerating well. Check lipids.   HYPERGLYCEMIA Check A1c.   Hypotension Following with cardiology. Doing better now on midodrine 5 mg twice daily and fludrocortisone 0.1 mg daily.   Vitamin B 12 deficiency Check B12.   Osteoarthritis Follows with orthopedics. Symptoms are stable.   Preventative Healthcare: Check labs.  Up-to-date on cancer screening and vaccines.  Patient Counseling(The following topics were reviewed and/or handout was given):  -Nutrition: Stressed importance of moderation in sodium/caffeine intake, saturated fat and cholesterol, caloric balance, sufficient intake of fresh fruits, vegetables, and fiber.  -Stressed the importance of regular exercise.   -Substance Abuse: Discussed cessation/primary prevention of tobacco, alcohol, or other drug use; driving or other dangerous activities under the influence; availability of treatment for abuse.   -Injury prevention: Discussed safety belts, safety helmets, smoke detector, smoking near bedding or upholstery.   -Sexuality: Discussed sexually transmitted diseases, partner selection, use of condoms, avoidance of unintended pregnancy and contraceptive alternatives.   -Dental health: Discussed importance of regular tooth brushing, flossing, and dental visits.  -Health maintenance and immunizations reviewed. Please refer to Health maintenance section.  Return to care in 1 year for next preventative visit.     Subjective:  HPI:  He has no acute complaints today.   He was last here about 2 months ago. Was having some right hip pain at that time and we referred him over to see the orthopedist. Ended up getting a steroid shot which worked well for this. While there they also talked about pain in his right knee. He was  referred to physical therapy and he has been doing well with this.   He has also been following with cardiology for his orthostatic hypotension. They have been adjusting his mediations which has been helping. HE is now on midodrine 5 mg twice daily and fludrocortisone 0.1 mg daily.   Lifestyle Diet: Balanced. Plenty of fruits and vegetables.  Exercise: Plays golf routinely. Goes to gym routinely.      02/27/2023    1:33 PM  Depression screen PHQ 2/9  Decreased Interest 0  Down, Depressed, Hopeless 0  PHQ - 2 Score 0    Health Maintenance Due  Topic Date Due   DTaP/Tdap/Td (3 - Td or Tdap) 07/13/2021   Medicare Annual Wellness (AWV)  02/19/2022     ROS: Per HPI, otherwise a complete review of systems was negative.   PMH:  The following were reviewed and entered/updated in epic: Past Medical History:  Diagnosis Date   BRADYCARDIA 06/01/2007   Cataract    bil cataracts removed   COPD (chronic obstructive pulmonary disease) (HCC)    EMPHYSEMA 03/28/2008   Emphysema of lung (HCC)    GERD (gastroesophageal reflux disease)    HYPERGLYCEMIA 06/01/2007   HYPERLIPIDEMIA 06/01/2007   Hypertension    HYPERTENSION 01/19/2009   Peripheral arterial disease (HCC)    TIA (transient ischemic attack)    Patient Active Problem List   Diagnosis Date Noted   Vasovagal syncope 08/12/2022   Vertigo 06/09/2022   Osteoarthritis 02/24/2022   Vitamin B 12 deficiency 10/03/2021   Orthostatic hypotension 04/16/2021   Neuropathy 02/19/2021   Abnormal CXR 02/13/2021   BPV (benign positional vertigo) 02/13/2021   Hypotension 06/19/2020   TIA (transient ischemic attack) 04/18/2020   PAD (peripheral artery disease) (  HCC) 04/03/2020   Neck pain 12/30/2018   GERD (gastroesophageal reflux disease) 09/28/2018   Peripheral arterial disease (HCC) 06/15/2018   Former smoker 06/15/2018   History of colonic polyps 01/07/2018   Allergic rhinitis 05/31/2013   Urticaria 11/02/2011   COPD, group A, by GOLD  2017 classification (HCC) 03/28/2008   Dyslipidemia 06/01/2007   Bradycardia 06/01/2007   HYPERGLYCEMIA 06/01/2007   Past Surgical History:  Procedure Laterality Date   ABDOMINAL AORTOGRAM W/LOWER EXTREMITY N/A 03/05/2020   Procedure: ABDOMINAL AORTOGRAM W/  LOWER EXTREMITY;  Surgeon: Runell Gess, MD;  Location: [facility];  Service: Cardiovascular;  Laterality: N/A;   CATARACT EXTRACTION, BILATERAL     COLONOSCOPY     ENDARTERECTOMY FEMORAL Right 04/03/2020   Procedure: Right Femoral Endarterectomy;  Surgeon: Sherren Kerns, MD;  Location: [facility] OR;  Service: Vascular;  Laterality: Right;   ENDARTERECTOMY FEMORAL Left 09/10/2020   Procedure: ENDARTERECTOMY FEMORAL LEFT;  Surgeon: Sherren Kerns, MD;  Location: [facility] OR;  Service: Vascular;  Laterality: Left;   EYE SURGERY     Bilaterl cataracts   PATCH ANGIOPLASTY Left 09/10/2020   Procedure: PATCH ANGIOPLASTY OF LEFT FEMORAL ARTERY USING HEMASHIELD PLATINUM FINESSE PATCH;  Surgeon: Sherren Kerns, MD;  Location: [facility] OR;  Service: Vascular;  Laterality: Left;   POLYPECTOMY     TONSILLECTOMY AND ADENOIDECTOMY      Family History  Problem Relation Age of Onset   Cancer Mother        "Male" cancer   Uterine cancer Mother    Heart disease Father    Colon cancer Neg Hx    Esophageal cancer Neg Hx    Rectal cancer Neg Hx    Stomach cancer Neg Hx    Liver cancer Neg Hx    Pancreatic cancer Neg Hx    Prostate cancer Neg Hx    Stroke Neg Hx     Medications- reviewed and updated Current Outpatient Medications  Medication Sig Dispense Refill   ADVAIR DISKUS 250-50 MCG/ACT AEPB INHALE 1 PUFF INTO THE LUNGS EVERY 12 HOURS 60 each 11   aspirin 81 MG tablet Take 81 mg by mouth daily.      cyanocobalamin 2000 MCG tablet Take 1,000 mcg by mouth daily.     EPINEPHrine (EPIPEN 2-PAK) 0.3 mg/0.3 mL IJ SOAJ injection Inject 0.3 mLs (0.3 mg total) into the muscle as needed for anaphylaxis. 1 each 0   fludrocortisone  (FLORINEF) 0.1 MG tablet Take 1 tablet (0.1 mg total) by mouth daily. 90 tablet 3   fluticasone (FLONASE) 50 MCG/ACT nasal spray Place 2 sprays into both nostrils daily as needed for allergies or rhinitis.     midodrine (PROAMATINE) 5 MG tablet TAKE 1 TABLET BY MOUTH THREE TIMES DAILY WITH MEALS. TAKE AT 8AM, 12PM, AND 4PM (Patient taking differently: Take 5 mg by mouth 2 (two) times daily.) 270 tablet 2   rosuvastatin (CRESTOR) 20 MG tablet TAKE 1 TABLET(20 MG) BY MOUTH DAILY AT 6 PM 90 tablet 1   No current facility-administered medications for this visit.    Allergies-reviewed and updated Allergies  Allergen Reactions   Bee Venom Anaphylaxis    Yellow jackets   Meclizine     Fatigue     Social History   Socioeconomic History   Marital status: Widowed    Spouse name: Not on file   Number of children: Not on file   Years of education: Not on file   Highest education level: Not  on file  Occupational History   Occupation: Retired   Tobacco Use   Smoking status: Former    Packs/day: 0.50    Years: 40.00    Additional pack years: 0.00    Total pack years: 20.00    Types: Cigarettes    Quit date: 09/26/2017    Years since quitting: 5.4   Smokeless tobacco: Never   Tobacco comments:    stopped 2018  Vaping Use   Vaping Use: Never used  Substance and Sexual Activity   Alcohol use: No   Drug use: No   Sexual activity: Not on file  Other Topics Concern   Not on file  Social History Narrative   Lives at home with wife   Right handed   Caffeine: drinks de-caf drinks, diet drinks if its a coke.   Social Determinants of Health   Financial Resource Strain: Not on file  Food Insecurity: Not on file  Transportation Needs: Not on file  Physical Activity: Not on file  Stress: Not on file  Social Connections: Not on file        Objective:  Physical Exam: BP 131/76   Pulse 71   Temp 97.7 F (36.5 C) (Temporal)   Ht 5\' 11"  (1.803 m)   Wt 205 lb 6.4 oz (93.2 kg)    SpO2 96%   BMI 28.65 kg/m   Body mass index is 28.65 kg/m. Wt Readings from Last 3 Encounters:  02/27/23 205 lb 6.4 oz (93.2 kg)  01/27/23 205 lb 9.6 oz (93.3 kg)  01/13/23 205 lb (93 kg)   Gen: NAD, resting comfortably HEENT: TMs normal bilaterally. OP clear. No thyromegaly noted.  CV: RRR with no murmurs appreciated Pulm: NWOB, CTAB with no crackles, wheezes, or rhonchi GI: Normal bowel sounds present. Soft, Nontender, Nondistended. MSK: no edema, cyanosis, or clubbing noted Skin: warm, dry Neuro: CN2-12 grossly intact. Strength 5/5 in upper and lower extremities. Reflexes symmetric and intact bilaterally.  Psych: Normal affect and thought content     Rhodie Cienfuegos M. Jimmey Ralph, MD 02/27/2023 2:18 PM

## 2023-02-27 NOTE — Assessment & Plan Note (Signed)
On crestor 20 mg daily. Tolerating well. Check lipids.

## 2023-02-27 NOTE — Assessment & Plan Note (Signed)
Check B12 

## 2023-02-27 NOTE — Assessment & Plan Note (Signed)
Check A1c. 

## 2023-02-27 NOTE — Assessment & Plan Note (Signed)
>>  ASSESSMENT AND PLAN FOR HYPOTENSION WRITTEN ON 02/27/2023  2:15 PM BY KENNYTH WORTH HERO, MD  Following with cardiology. Doing better now on midodrine  5 mg twice daily and fludrocortisone  0.1 mg daily.

## 2023-02-27 NOTE — Assessment & Plan Note (Signed)
Following with cardiology. Doing better now on midodrine 5 mg twice daily and fludrocortisone 0.1 mg daily.

## 2023-03-03 NOTE — Progress Notes (Signed)
Great news!  Labs are all stable.  Do not need to make any changes to treatment plan.  He should continue to work on diet and exercise and we can recheck everything in a year or so.

## 2023-03-05 DIAGNOSIS — M25561 Pain in right knee: Secondary | ICD-10-CM | POA: Diagnosis not present

## 2023-03-06 ENCOUNTER — Ambulatory Visit: Payer: Medicare Other | Admitting: Pulmonary Disease

## 2023-03-10 ENCOUNTER — Ambulatory Visit: Payer: Medicare Other | Admitting: Internal Medicine

## 2023-03-10 ENCOUNTER — Telehealth: Payer: Self-pay | Admitting: Family Medicine

## 2023-03-10 NOTE — Telephone Encounter (Signed)
See results note. 

## 2023-03-10 NOTE — Telephone Encounter (Signed)
Pt returned a call on 03/06/23 after hours. Requesting a call back.

## 2023-03-11 ENCOUNTER — Telehealth: Payer: Self-pay | Admitting: Internal Medicine

## 2023-03-11 NOTE — Telephone Encounter (Signed)
Pt c/o medication issue:  1. Name of Medication:   midodrine (PROAMATINE) 5 MG tablet   2. How are you currently taking this medication (dosage and times per day)?   3. Are you having a reaction (difficulty breathing--STAT)?   4. What is your medication issue?   Patient wants to know if he can take another 1/2 tablet just in the morning. Patient stated he is not getting his BP to go up.

## 2023-03-11 NOTE — Telephone Encounter (Signed)
Returned pts call about taking an additional 1/2 5 mg tablet in the morning of midodrine. Pt stated that his "top numbers" are under 100 in the morning (before taking any medicine) and he feels dizzy about 2 hours after breakfast. Pt did not keep record of bps. Pt stated he wasn't going to tell but he has for the past 2 days went ahead and taken an extra 1/2 tablet in the mornings and has been feeling better doing that. He states he doesn't feel foggy headed and dizzy after taking the extra 1/2 tablet. I told pt I would forward his request to take an extra 1/2 5mg  in the mornings to Dr. Graciela Husbands for his review and that someone would call him back with a recommendation.

## 2023-03-12 MED ORDER — MIDODRINE HCL 5 MG PO TABS: ORAL_TABLET | ORAL | 2 refills | Status: DC

## 2023-03-12 NOTE — Telephone Encounter (Signed)
Called to confirm the additional 1/2 tablet in the morning was approved by Dr.Skains. Patient stated understanding and thanks.

## 2023-03-13 ENCOUNTER — Ambulatory Visit: Payer: Medicare Other | Admitting: Physician Assistant

## 2023-03-13 NOTE — Progress Notes (Deleted)
Office Visit    Patient Name: Bryan Armstrong Date of Encounter: 03/13/2023  PCP:  Ardith Dark, MD   [facility]  Cardiologist:  Tonny Bollman, MD  Advanced Practice Provider:  No care team member to display Electrophysiologist:  Sherryl Manges, MD   HPI    Bryan Armstrong is a 88 y.o. male with a past medical history of orthostatic hypotension in the setting of peripheral neuropathy and gait disturbance, RBBB presents today for follow-up.  He was seen by Dr. Graciela Husbands 01/13/2023 and had stopped his cortisone because it was not working.  He was taking midodrine couple times a day.  Since then was seen in our clinic and patient reported that he was about the same may be a little better.  Blood pressure has been relatively stable and orthostatics are better than in the past.  Still had an episode about once a day.  Sometimes episodes lasted only 2 to 3 minutes.  No symptoms at the last visit 01/27/2023.  Had been trying to take his steroids at different times of the day with no real difference in his symptoms.  Today, he***  Past Medical History    Past Medical History:  Diagnosis Date   BRADYCARDIA 06/01/2007   Cataract    bil cataracts removed   COPD (chronic obstructive pulmonary disease) (HCC)    EMPHYSEMA 03/28/2008   Emphysema of lung (HCC)    GERD (gastroesophageal reflux disease)    HYPERGLYCEMIA 06/01/2007   HYPERLIPIDEMIA 06/01/2007   Hypertension    HYPERTENSION 01/19/2009   Peripheral arterial disease (HCC)    TIA (transient ischemic attack)    Past Surgical History:  Procedure Laterality Date   ABDOMINAL AORTOGRAM W/LOWER EXTREMITY N/A 03/05/2020   Procedure: ABDOMINAL AORTOGRAM W/  LOWER EXTREMITY;  Surgeon: Runell Gess, MD;  Location: [facility];  Service: Cardiovascular;  Laterality: N/A;   CATARACT EXTRACTION, BILATERAL     COLONOSCOPY     ENDARTERECTOMY FEMORAL Right 04/03/2020   Procedure: Right Femoral Endarterectomy;   Surgeon: Sherren Kerns, MD;  Location: [facility] OR;  Service: Vascular;  Laterality: Right;   ENDARTERECTOMY FEMORAL Left 09/10/2020   Procedure: ENDARTERECTOMY FEMORAL LEFT;  Surgeon: Sherren Kerns, MD;  Location: [facility] OR;  Service: Vascular;  Laterality: Left;   EYE SURGERY     Bilaterl cataracts   PATCH ANGIOPLASTY Left 09/10/2020   Procedure: PATCH ANGIOPLASTY OF LEFT FEMORAL ARTERY USING HEMASHIELD PLATINUM FINESSE PATCH;  Surgeon: Sherren Kerns, MD;  Location: [facility] OR;  Service: Vascular;  Laterality: Left;   POLYPECTOMY     TONSILLECTOMY AND ADENOIDECTOMY      Allergies  Allergies  Allergen Reactions   Bee Venom Anaphylaxis    Yellow jackets   Meclizine     Fatigue     History of Present Illness    Bryan Armstrong is a 88 y.o. male with a hx of *** last seen ***.   EKGs/Labs/Other Studies Reviewed:   The following studies were reviewed today: Cardiac Studies & Procedures     STRESS TESTS  MYOCARDIAL PERFUSION IMAGING 03/21/2020  Narrative  Nuclear stress EF: 59%.  There was no ST segment deviation noted during stress.  The study is normal.  This is a low risk study.  The left ventricular ejection fraction is normal (55-65%).   ECHOCARDIOGRAM  ECHOCARDIOGRAM COMPLETE 04/18/2020  Narrative ECHOCARDIOGRAM REPORT    Patient Name:   Bryan Armstrong Date of Exam: 04/18/2020  Medical Rec #:  [id number]       Height:       71.0 in Accession #:    [id number]      Weight:       205.0 lb Date of Birth:  Mar 29, 1935       BSA:          2.131 m Patient Age:    85 years        BP:           171/77 mmHg Patient Gender: M               HR:           57 bpm. Exam Location:  Inpatient  Procedure: 2D Echo  Indications:    TIA 435.9  History:        Patient has no prior history of Echocardiogram examinations. COPD; Risk Factors:Hypertension and Dyslipidemia.  Sonographer:    Delcie Roch Referring Phys: 42 ARSHAD N KAKRAKANDY  IMPRESSIONS   1. Left  ventricular ejection fraction, by estimation, is 60 to 65%. The left ventricle has normal function. The left ventricle has no regional wall motion abnormalities. Left ventricular diastolic parameters were normal. 2. Right ventricular systolic function is normal. The right ventricular size is normal. 3. Left atrial size was mildly dilated. 4. The mitral valve is normal in structure. Trivial mitral valve regurgitation. No evidence of mitral stenosis. 5. The aortic valve is normal in structure. Aortic valve regurgitation is not visualized. Mild aortic valve sclerosis is present, with no evidence of aortic valve stenosis. 6. The inferior vena cava is normal in size with greater than 50% respiratory variability, suggesting right atrial pressure of 3 mmHg.  FINDINGS Left Ventricle: Left ventricular ejection fraction, by estimation, is 60 to 65%. The left ventricle has normal function. The left ventricle has no regional wall motion abnormalities. The left ventricular internal cavity size was normal in size. There is no left ventricular hypertrophy. Left ventricular diastolic parameters were normal.  Right Ventricle: The right ventricular size is normal. No increase in right ventricular wall thickness. Right ventricular systolic function is normal.  Left Atrium: Left atrial size was mildly dilated.  Right Atrium: Right atrial size was normal in size.  Pericardium: There is no evidence of pericardial effusion.  Mitral Valve: The mitral valve is normal in structure. Normal mobility of the mitral valve leaflets. Moderate mitral annular calcification. Trivial mitral valve regurgitation. No evidence of mitral valve stenosis.  Tricuspid Valve: The tricuspid valve is normal in structure. Tricuspid valve regurgitation is not demonstrated. No evidence of tricuspid stenosis.  Aortic Valve: The aortic valve is normal in structure. Aortic valve regurgitation is not visualized. Mild aortic valve sclerosis is  present, with no evidence of aortic valve stenosis.  Pulmonic Valve: The pulmonic valve was normal in structure. Pulmonic valve regurgitation is not visualized. No evidence of pulmonic stenosis.  Aorta: The aortic root is normal in size and structure.  Venous: The inferior vena cava is normal in size with greater than 50% respiratory variability, suggesting right atrial pressure of 3 mmHg.  IAS/Shunts: No atrial level shunt detected by color flow Doppler.   LEFT VENTRICLE PLAX 2D LVIDd:         5.10 cm  Diastology LVIDs:         3.30 cm  LV e' lateral:   6.64 cm/s LV PW:         1.00 cm  LV E/e' lateral: 9.0 LV IVS:  0.90 cm  LV e' medial:    5.22 cm/s LVOT diam:     2.00 cm  LV E/e' medial:  11.5 LV SV:         82 LV SV Index:   38 LVOT Area:     3.14 cm   RIGHT VENTRICLE             IVC RV S prime:     14.60 cm/s  IVC diam: 1.30 cm TAPSE (M-mode): 3.8 cm  LEFT ATRIUM           Index       RIGHT ATRIUM           Index LA diam:      4.20 cm 1.97 cm/m  RA Area:     16.80 cm LA Vol (A2C): 57.5 ml 26.99 ml/m RA Volume:   41.70 ml  19.57 ml/m AORTIC VALVE LVOT Vmax:   110.00 cm/s LVOT Vmean:  68.600 cm/s LVOT VTI:    0.261 m  AORTA Ao Root diam: 3.90 cm Ao Asc diam:  3.70 cm  MITRAL VALVE MV Area (PHT): 1.86 cm    SHUNTS MV Decel Time: 408 msec    Systemic VTI:  0.26 m MV E velocity: 60.00 cm/s  Systemic Diam: 2.00 cm MV A velocity: 97.30 cm/s MV E/A ratio:  0.62  Charlton Haws MD Electronically signed by Charlton Haws MD Signature Date/Time: 04/18/2020/4:33:40 PM    Final    MONITORS  LONG TERM MONITOR (3-14 DAYS) 03/25/2021  Narrative Patch Wear Time:  2 days and 22 hours (2022-05-31T19:34:23-0400 to 2022-06-03T17:37:54-0400)  Patient had a min HR of 45 bpm, max HR of 148 bpm, and avg HR of 60 bpm. Predominant underlying rhythm was Sinus Rhythm. Slight P wave morphology changes were noted. 10 Supraventricular Tachycardia runs occurred, the run with  the fastest interval lasting 5 beats with a max rate of 148 bpm, the longest lasting 10 beats with an avg rate of 112 bpm. Isolated SVEs were occasional (1.8%, 4317), SVE Couplets were rare (<1.0%, 181), and SVE Triplets were rare (<1.0%, 5). Isolated VEs were rare (<1.0%), and no VE Couplets or VE Triplets were present.  SUMMARY: 1. The basic rhythm is normal sinus with an average HR of 60 bpm 2. No atrial fibrillation or flutter 3. No high-grade heart block or pathologic pauses 4. There are rare PVC's (<1% burden) and occasional supraventricular beats (1.8% burden) without sustained arrhythmias            EKG:  EKG is *** ordered today.  The ekg ordered today demonstrates ***  Recent Labs: 02/27/2023: ALT 15; BUN 21; Creatinine, Ser 1.16; Hemoglobin 13.9; Platelets 200.0; Potassium 4.0; Sodium 139; TSH 2.52  Recent Lipid Panel    Component Value Date/Time   CHOL 110 02/27/2023 1431   TRIG 116.0 02/27/2023 1431   HDL 36.00 (L) 02/27/2023 1431   CHOLHDL 3 02/27/2023 1431   VLDL 23.2 02/27/2023 1431   LDLCALC 51 02/27/2023 1431    Risk Assessment/Calculations:  {Does this patient have ATRIAL FIBRILLATION?[phone]}  Home Medications   No outpatient medications have been marked as taking for the 03/13/23 encounter (Appointment) with Sharlene Dory, PA-C.     Review of Systems   ***   All other systems reviewed and are otherwise negative except as noted above.  Physical Exam    VS:  There were no vitals taken for this visit. , BMI There is no height or weight on file to  calculate BMI.  Wt Readings from Last 3 Encounters:  02/27/23 205 lb 6.4 oz (93.2 kg)  01/27/23 205 lb 9.6 oz (93.3 kg)  01/13/23 205 lb (93 kg)     GEN: Well nourished, well developed, in no acute distress. HEENT: normal. Neck: Supple, no JVD, carotid bruits, or masses. Cardiac: ***RRR, no murmurs, rubs, or gallops. No clubbing, cyanosis, edema.  ***Radials/PT 2+ and equal bilaterally.  Respiratory:   ***Respirations regular and unlabored, clear to auscultation bilaterally. GI: Soft, nontender, nondistended. MS: No deformity or atrophy. Skin: Warm and dry, no rash. Neuro:  Strength and sensation are intact. Psych: Normal affect.  Assessment & Plan    Orthostatic hypotension, severe Vertigo, vasovagal syncope, ataxia Peripheral vascular disease RBBB old Neuropathy  No BP recorded.  {Refresh Note OR Click here to enter BP  :1}***      Disposition: Follow up {follow up:15908} with Tonny Bollman, MD or APP.  Signed, Sharlene Dory, PA-C 03/13/2023, 9:03 AM  [facility]

## 2023-03-19 DIAGNOSIS — M25551 Pain in right hip: Secondary | ICD-10-CM | POA: Diagnosis not present

## 2023-03-19 DIAGNOSIS — M545 Low back pain, unspecified: Secondary | ICD-10-CM | POA: Diagnosis not present

## 2023-03-20 ENCOUNTER — Encounter: Payer: Self-pay | Admitting: Pulmonary Disease

## 2023-03-20 ENCOUNTER — Ambulatory Visit: Payer: Medicare Other | Admitting: Pulmonary Disease

## 2023-03-20 VITALS — BP 118/72 | HR 60 | Temp 97.5°F | Ht 70.0 in | Wt 202.0 lb

## 2023-03-20 DIAGNOSIS — J449 Chronic obstructive pulmonary disease, unspecified: Secondary | ICD-10-CM

## 2023-03-20 MED ORDER — ADVAIR DISKUS 250-50 MCG/ACT IN AEPB: INHALATION_SPRAY | RESPIRATORY_TRACT | 12 refills | Status: DC

## 2023-03-20 NOTE — Patient Instructions (Signed)
I am glad you are doing well with your breathing You can continue the Advair Since you are stable your primary care can take over and prescribe Advair going forward Follow-up in pulmonary clinic as needed

## 2023-03-20 NOTE — Progress Notes (Signed)
         Bryan Armstrong    [id number]    19-May-1935  Primary Care Physician:Parker, Katina Degree, MD  Referring Physician: Ardith Dark, MD [address] [place],  [place] [address]  Chief complaint:   Follow up for COPD GOLD A  HPI: [name] is a 88 y.o.  with COPD GOLD A (CAT score 7, no exacerbations, FEV1 89%). He is doing well with this current inhaler therapy. He is also on albuterol when necessary. However he does not need his rescue inhaler. He does not have any recent exacerbations or ER visits.  He feels well today with dyspnea on exertion at baseline. He denies any fevers, cough, wheezing, chest pain, palpitation. He quit smoking in September 2017. He used to smoke about half pack a day for about 40 years.  Interim History: Symptoms are stable on Advair inhaler Continues to stay active with golf  Outpatient Encounter Medications as of 03/20/2023  Medication Sig   ADVAIR DISKUS 250-50 MCG/ACT AEPB INHALE 1 PUFF INTO THE LUNGS EVERY 12 HOURS   aspirin 81 MG tablet Take 81 mg by mouth daily.    cyanocobalamin 2000 MCG tablet Take 1,000 mcg by mouth daily.   EPINEPHrine (EPIPEN 2-PAK) 0.3 mg/0.3 mL IJ SOAJ injection Inject 0.3 mLs (0.3 mg total) into the muscle as needed for anaphylaxis.   fludrocortisone (FLORINEF) 0.1 MG tablet Take 1 tablet (0.1 mg total) by mouth daily.   fluticasone (FLONASE) 50 MCG/ACT nasal spray Place 2 sprays into both nostrils daily as needed for allergies or rhinitis.   midodrine (PROAMATINE) 5 MG tablet Take 1 1/2 tablets (7.5mg ) at 8am and 1 tablet(5mg ) at 4pm by mouth with meals.   rosuvastatin (CRESTOR) 20 MG tablet TAKE 1 TABLET(20 MG) BY MOUTH DAILY AT 6 PM   No facility-administered encounter medications on file as of 03/20/2023.   Physical Exam: Blood pressure 118/72, pulse 60, temperature (!) 97.5 F (36.4 C), temperature source Oral, height 5\' 10"  (1.778 m), weight 202 lb (91.6 kg), SpO2 94 %. Gen:      No acute distress HEENT:  EOMI,  sclera anicteric Neck:     No masses; no thyromegaly Lungs:    Clear to auscultation bilaterally; normal respiratory effort CV:         Regular rate and rhythm; no murmurs Abd:      + bowel sounds; soft, non-tender; no palpable masses, no distension Ext:    No edema; adequate peripheral perfusion Skin:      Warm and dry; no rash Neuro: alert and oriented x 3 Psych: normal mood and affect   Data Reviewed: Imaging: Chest x-ray 09/29/2019-small reticulonodular opacity at the right lung base Chest x-ray 11/11/2019-no acute cardiopulmonary abnormality. Chest x-ray 02/13/2021-no acute cardiopulmonary abnormality I have reviewed the images personally.  PFTs [01/06/05] FVC 4.50 [96%], FEV1 2.85 [89], F/F 63, TLC 7.76 [111%], DLCO 132%. Mild airway obstruction, normal lung volumes, no bronchial dilator response.  Labs:  CBC 12/11/16-absolute eosinophilic count 600  Assessment:  COPD GOLD A Stable on advair.  Blood count noted for elevated eosinophils.  He will need to be on inhaled steroid therapy He is beyond the age cutoff for Lung cancer screening He can continue with his PCP and follow-up with [place] as needed  Plan/Recommendations: Continue Advair Follow-up as needed  Bryan Greathouse MD [facility] 03/20/2023, 2:33 PM  CC: Bryan Dark, MD

## 2023-04-08 ENCOUNTER — Encounter: Payer: Self-pay | Admitting: Neurology

## 2023-04-08 ENCOUNTER — Ambulatory Visit: Payer: Medicare Other | Admitting: Neurology

## 2023-04-08 ENCOUNTER — Telehealth: Payer: Self-pay | Admitting: Neurology

## 2023-04-08 VITALS — BP 109/80 | HR 63 | Ht 71.0 in | Wt 200.0 lb

## 2023-04-08 DIAGNOSIS — H5347 Heteronymous bilateral field defects: Secondary | ICD-10-CM

## 2023-04-08 DIAGNOSIS — R42 Dizziness and giddiness: Secondary | ICD-10-CM

## 2023-04-08 NOTE — Telephone Encounter (Signed)
Referral faxed to Neurorehabilitation: Phone: [phone]   Fax: [phone]

## 2023-04-08 NOTE — Patient Instructions (Signed)
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  Continue aspirin for stroke prevention and maintain aggressive risk factor modification.  Maintain adequate hydration.  Increase salt intake.  Continue Florinef 0.1 mg daily and midodrine 4 times daily as needed.  Check screening carotid ultrasound and transcranial Doppler studies.  Refer to physical therapy for gait and balance training.  Return for follow-up with nurse practitioner in 6 months or call earlier if necessary.

## 2023-04-08 NOTE — Progress Notes (Signed)
[facility] [address] [place]. [place] [address] [phone]       OFFICE FOLLOW-UP NOTE  Bryan Armstrong Date of Birth:  09-Jun-1935 Medical Record Number:  [id number]    Chief Complaint  Patient presents with   Follow-up    Rm 3,  Here for a follow up on neuropathy on feet, states has been feeling the same has not gotten worse. Pt states he's BP seems to off. States has noticed once a month has vision loss on right eye on the bottom part of his eye. States "he feels something's off".       HPI:  04/08/2023 : He returns for follow-up after last visit 6 months ago with Bryan Armstrong nurse practitioner.  He continues to have intermittent transient episodes of dizziness, lightheadedness with right eye altitudinal vision loss in the lower occurring once a month or so.  These episodes last 5 to 7 minutes.  There is no clear triggers but he feels his blood pressure is low.  He takes Florinef 0.1 tablet daily as well as midodrine up to 4 times a day.  Lab work done on 02/27/2023 vitamin B12 06 20.  LDL cholesterol 59 mg remains tolerating well with minor bruising and no bleeding.  He is tolerating Crestor well without muscle aches and pains.  He has not had any brain imaging or Doppler studies done for more than 2 years 09/24/2022 Bryan Armstrong: Patient returns for 46-month follow-up unaccompanied.    Back in July, episode of transient right eyelid drooping and bilateral fingertip numbness, was seen in ED, MRI negative for acute stroke.  Mention possibly due to low blood pressure.  Cardiology added Florinef in addition to midodrine for symptomatic hypotension, continues to be closely followed by cardiology Bryan Armstrong and Bryan Armstrong.  Denies any new stroke/TIA symptoms since that time.  Reports stabilization of blood pressure over the past couple of weeks weeks. Does report occasional fluctuation of right vision. Feels like his right eye will "flutter" and that's how he knows his blood  pressure is low. He was seen by Bryan Armstrong back in July with extensive vision exam without any concerning findings. He plans on scheduling a f/u visit to further discuss continued vision issues.   Feels like neuropathy symptoms have "leveled off" and possibly improved especially since starting B12 oral supplement. Does have persistent bilateral foot mild tingling sensation but no significant pain  Impaired gait/imbalance stable since prior visit, denies any worsening.  Completed PT which was greatly beneficial and continues to do exercises at home.  He does try to be very cautious with walking especially on uneven ground.     History provided for reference purposes only Update 03/18/2022 Bryan Armstrong: Patient returns for follow-up visit after prior visit with Bryan Armstrong approximately 4 months ago.  Since prior visit, reports neuropathy continues to improve since correcting B12 deficiency.  Recent B12 level satisfactory, recently monitored by PCP.  Neuropathy panel checked at prior visit which showed abnormal M protein spike and referred to hematology for further evaluation, also noted elevated rheumatoid factor but as asymptomatic, further evaluation not pursued.    He has also been having issues with imbalance/unsteadiness.  Currently working with PT with continued improvement.  He will have imbalance at times with hypotension but rare occurrence is relatively well controlled on midodrine.  Also mentions worsening imbalance with vision fluctuation.  Reports seeing objects smaller from right eye with left eye closed which is not new.  Also mentions ongoing  transient right eye altitudinal visual loss (previously discussed with Bryan Armstrong) but has been improving.  He questions further evaluation with ophthalmologist.  No new concerns at this time.  Update 12/04/2021 Bryan Armstrong He returns for follow-up after last visit 3 months ago.  Patient states has noticed improvement in the tingling numbness in his feet after  starting vitamin B-12 replacement for a month.  His vitamin B12 was found to be low at 212.  TSH and hemoglobin A1c were normal.  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  Patient was started on B12 replacement injections and now has been switched to tablets and has noticed a distinct improvement in the numbness which used to be constant every time he rested on laydown is now on intermittent and is not as bothersome.  He also is to have some mental fogging and lack of thought clarity which also seems to be improving.  He complains of occasional transient right altitudinal vision loss for 5 to 10 seconds of especially in his standing or sitting this may be related to his low blood pressure resume at baseline returns below.  Recently his blood pressure medications were reduced.  He has been started on midodrine for orthostatic hypotension.  He also has some difficulty with activities like playing golf for he feels he is slightly off balance when when he is completing the swing he is not sure if he will fall.   Update 08/26/2021 Bryan Armstrong: Returns for 32-month stroke follow-up.  Stable from stroke standpoint without new stroke/TIA symptoms.  Compliant on aspirin and Crestor -denies side effects.  Blood pressure today 154/82.  Continues to experience bilateral toe numbness as well as imbalance (discussed at prior visit). Denies worsening since prior visit. B/l toe numbness/tingling worse at night but denies associated pain.  Has been taking B12 supplement. Continues on midodrine 2.5mg  TID which has been helping BP and preventing orthostatic hypotension. Feels like legs may be weaker since prior visit. Occasional vertigo - quick lasting.  Has been participating at [facility] routinely and playing golf.  No further concerns at this time.  Update 04/16/2021 Bryan Armstrong: Bryan Armstrong returns for 96-month follow-up.  Stable from stroke standpoint without new stroke/TIA symptoms.  He did have  1 episode of vertigo back in April which subsided after performing Epley maneuver -no additional episodes since that time.  His greatest concern today is in regards to gradually worsening BLE numbness/tingling present at nighttime -denies associated pain. PCP recently checked lab work (02/19/2021) with B12 212 and started on monthly injections with last injection 5/27.  He believes he may have had some benefit of symptoms after B12 injection.  Continues to follow with cardiology for symptomatic hypotension and started on midodrine on 5/27 - he reports taking approx 6-7 pills since starting using more frequently in the first 2 weeks. He does have follow up with Bryan Armstrong 7/7. BP has been more regulated where he has not been experiencing low blood pressure.  Blood pressure today 102/60.  Compliant on aspirin and Crestor without associated side effects.  No further concerns at this time.  Update 11/19/2020 Bryan Armstrong: Mr. Dunivan returns for TIA follow-up as well as concerns of dizziness/vertigo unaccompanied.  Reports episode of vertigo 1 week ago where he felt as though he was getting pulled towards the right side and was able to lower himself to his knees as he felt as though he was going to fall.  This sensation lasted for 2  to 3 minutes and then subsided.  He laid down in his bed and took a nap for approximately 20 minutes with resolution of symptoms upon awakening.  Denies any other associated symptoms such as weakness, numbness/tingling, visual changes, speech changes, facial weakness or headaches.  He was evaluated by ENT for continued vertigo (unable to view via epic) - per patient, told left inner ear issue and referred to PT for vestibular rehab.  He has been working with neuro rehab PT with improvement of symptoms.  He is able to turn his head more without provoking symptoms and symptoms typically progress with quick head movements.  He did trial meclizine x1 with increased fatigue without noticeable benefit.   Otherwise, he has been stable from a stroke standpoint.  Reports ongoing compliance with aspirin 81 mg daily and Crestor 20 mg daily and denies side effects.  Blood pressure today 127/77.  Previously having issues with hypotension therefore PCP discontinued Maxzide and blood pressure has been slowly stabilizing.  He will occasionally have low blood pressure still where he will feel lightheaded in 1 to 2 seconds OD visual change which are typical symptoms of low blood pressure for patient.  No further concerns at this time.  Initial visit 06/19/2020 Bryan Armstrong: Mr. Brass is a 88 year old Caucasian male seen today for initial office follow-up visit following hospital consultation for TIA in July 2021.  History is obtained from the patient, review of electronic medical records and I personally reviewed available imaging films in PACS.  He has past medical history for hyperlipidemia, hypertension, peripheral arterial disease, COPD, bradycardia with history of syncope who presented to [facility] on 04/17/2020 with sudden onset of gait instability.  He felt suddenly off balance and was leaning to the right side and slammed into a wall.  He denied any accompanying vertigo, room spinning, lightheadedness blurred vision no slurred speech or double vision.  This lasted about 5 minutes and then gradually returned back to normal.  He stated he had a previous episode 10 years ago of suddenly collapsing after getting up from the dining table at a restaurant and was felt to have had a vasovagal syncope at that time.  He does have history of peripheral arterial disease and underwent right femoral endarterectomy on 04/03/2020 with Dr. Darrick Penna and left-sided endarterectomy is also planned.  MRI scan of the brain obtained in the hospital was negative for acute infarct and showed small changes of small vessel disease CT angiogram showed no significant extracranial intracranial large vessel stenosis or occlusion.  LDL cholesterol  57 mg percent and hemoglobin A1c was 5.7.  Transthoracic echo showed normal ejection fraction without cardiac source of embolism.  Outpatient cardiac Holter monitoring showed no significant cardiac arrhythmias.  Patient states he has had episodes of hypotension which are manifested with blurred vision and feeling tired and fuzzy headed.  The has noticed this happen multiple times when his blood pressure has been below 100 systolic.  He does take Maxide about 2 or 3 days a week when his blood pressure is running in the 150s.  He seems to be exquisitely sensitive to this.  He plans to discuss with his primary care physician alternative blood pressure management strategies.  He admits to not drinking enough fluids.  He was scheduled to undergo dental surgery soon which she hopes will solve his problems.  He has had no recurrent stroke or TIA symptoms.  He remains on Crestor 20 mg he is tolerating well without muscle aches and  pains.     ROS:   14 system review of systems is positive for those listed in HPI and all other systems negative  PMH:  Past Medical History:  Diagnosis Date   BRADYCARDIA 06/01/2007   Cataract    bil cataracts removed   COPD (chronic obstructive pulmonary disease) (HCC)    EMPHYSEMA 03/28/2008   Emphysema of lung (HCC)    GERD (gastroesophageal reflux disease)    HYPERGLYCEMIA 06/01/2007   HYPERLIPIDEMIA 06/01/2007   Hypertension    HYPERTENSION 01/19/2009   Peripheral arterial disease (HCC)    TIA (transient ischemic attack)     Social History:  Social History   Socioeconomic History   Marital status: Widowed    Spouse name: Not on file   Number of children: Not on file   Years of education: Not on file   Highest education level: Not on file  Occupational History   Occupation: Retired   Tobacco Use   Smoking status: Former    Packs/day: 0.50    Years: 40.00    Additional pack years: 0.00    Total pack years: 20.00    Types: Cigarettes    Quit date:  09/26/2017    Years since quitting: 5.5   Smokeless tobacco: Never   Tobacco comments:    stopped 2018  Vaping Use   Vaping Use: Never used  Substance and Sexual Activity   Alcohol use: No   Drug use: No   Sexual activity: Not on file  Other Topics Concern   Not on file  Social History Narrative   Lives at home with wife   Right handed   Caffeine: drinks de-caf drinks, diet drinks if its a coke.   Social Determinants of Health   Financial Resource Strain: Not on file  Food Insecurity: Not on file  Transportation Needs: Not on file  Physical Activity: Not on file  Stress: Not on file  Social Connections: Not on file  Intimate Partner Violence: Not on file    Medications:   Current Outpatient Medications on File Prior to Visit  Medication Sig Dispense Refill   ADVAIR DISKUS 250-50 MCG/ACT AEPB INHALE 1 PUFF INTO THE LUNGS EVERY 12 HOURS 60 each 12   aspirin 81 MG tablet Take 81 mg by mouth daily.      cyanocobalamin 2000 MCG tablet Take 1,000 mcg by mouth daily.     EPINEPHrine (EPIPEN 2-PAK) 0.3 mg/0.3 mL IJ SOAJ injection Inject 0.3 mLs (0.3 mg total) into the muscle as needed for anaphylaxis. 1 each 0   fludrocortisone (FLORINEF) 0.1 MG tablet Take 1 tablet (0.1 mg total) by mouth daily. 90 tablet 3   fluticasone (FLONASE) 50 MCG/ACT nasal spray Place 2 sprays into both nostrils daily as needed for allergies or rhinitis.     midodrine (PROAMATINE) 5 MG tablet Take 1 1/2 tablets (7.5mg ) at 8am and 1 tablet(5mg ) at 4pm by mouth with meals. 225 tablet 2   rosuvastatin (CRESTOR) 20 MG tablet TAKE 1 TABLET(20 MG) BY MOUTH DAILY AT 6 PM 90 tablet 1   No current facility-administered medications on file prior to visit.    Allergies:   Allergies  Allergen Reactions   Bee Venom Anaphylaxis    Yellow jackets   Meclizine     Fatigue     Physical Exam Today's Vitals   04/08/23 1459  BP: 109/80  Pulse: 63  Weight: 200 lb (90.7 kg)  Height: 5\' 11"  (1.803 m)   Body mass  index  is 27.89 kg/m.    General: well developed, well nourished pleasant elderly Caucasian male, seated, in no evident distress Head: head normocephalic and atraumatic.  Neck: supple with no carotid or supraclavicular bruits Cardiovascular: regular rate and rhythm, no murmurs Musculoskeletal: no deformity Skin:  no rash/petichiae Vascular:  Normal pulses all extremities  Neurologic Exam Mental Status: Awake and fully alert.  Fluent speech and language.  Oriented to place and time. Recent and remote memory intact. Attention span, concentration and fund of knowledge appropriate. Mood and affect appropriate.  Cranial Nerves: Pupils equal, briskly reactive to light. Extraocular movements full without nystagmus. Visual fields full to confrontation. Hearing slightly diminished bilaterally. Facial sensation intact. Face, tongue, palate moves normally and symmetrically.  Motor: Normal bulk and tone. Normal strength in all tested extremity muscles  Sensory.: intact to touch ,pinprick .position and vibratory sensation.  Coordination: Rapid alternating movements normal in all extremities. Finger-to-nose and heel-to-shin performed accurately bilaterally. Gait and Station: Arises from chair without difficulty. Stance is slightly hunched. Gait demonstrates broad-based gait with slightly decreased stride length bilaterally without use of assistive device.  Tandem walk and heel toe not attempted Reflexes: 1+ and symmetric. Toes downgoing.       ASSESSMENT/PLAN: 88 year old male with posterior circulation TIA in July 2021 from small vessel disease.  Vascular risk factors of hypertension hyperlipidemia, peripheral arterial disease, BPPV and age.  Longstanding history of BPPV which has been stable.  Evidence of severe sensory neuropathy per EMG/NCV 10/2021 with significant B12 deficiency and improvement of symptoms after correction.  He continues to have recurrent transient episodes of dizziness,  lightheadedness and right eye altitudinal transient vision loss.  He has some small vessel disease likely in the setting of hypoension  I had a long discussion with the patient regarding his remote TIA as well as recurrent symptoms of transient dizziness, likely represent TIA secondary to small vessel disease and also his recurrent results of altitudinal right eye vision loss represent TIA from retinal small vessel disease as well.  Continue aspirin for stroke prevention and maintain aggressive risk factor modification.  Maintain adequate hydration.  Increase salt intake.  Continue Florinef 0.1 mg daily and midodrine 4 times daily as needed.  Check screening carotid ultrasound and transcranial Doppler studies.  Refer to physical therapy for gait and balance training.  Return for follow-up with nurse practitioner in 6 months or call earlier if necessary.  Greater than 50% time during this 35-minute visit was spent on counseling and coordination of care about his TIA and hypotension and answering questions    Delia Heady, MD  [facility] [address] [place], [place] [address]  Phone [phone] Fax [phone] Note: This document was prepared with digital dictation and possible smart phrase technology. Any transcriptional errors that result from this process are unintentional.

## 2023-04-20 ENCOUNTER — Ambulatory Visit (INDEPENDENT_AMBULATORY_CARE_PROVIDER_SITE_OTHER): Payer: Medicare Other

## 2023-04-20 VITALS — Wt 200.0 lb

## 2023-04-20 DIAGNOSIS — Z0000 Encounter for general adult medical examination without abnormal findings: Secondary | ICD-10-CM | POA: Diagnosis not present

## 2023-04-20 NOTE — Progress Notes (Signed)
Subjective:   Bryan Armstrong is a 88 y.o. male who presents for Medicare Annual/Subsequent preventive examination.  Visit Complete: Virtual  I connected with  Satira Sark on 04/20/23 by a audio enabled telemedicine application and verified that I am speaking with the correct person using two identifiers.  Patient Location: Home  Provider Location: Office/Clinic  I discussed the limitations of evaluation and management by telemedicine. The patient expressed understanding and agreed to proceed.  Review of Systems     Cardiac Risk Factors include: advanced age (>6men, >61 women);dyslipidemia;male gender     Objective:    Today's Vitals   04/20/23 1605  Weight: 200 lb (90.7 kg)   Body mass index is 27.89 kg/m.     04/20/2023    4:11 PM 09/29/2022    2:19 PM 05/05/2022    1:40 PM 02/05/2022    1:14 PM 01/28/2022   11:09 AM 10/17/2020    1:19 PM 09/05/2020    1:20 PM  Advanced Directives  Does Patient Have a Medical Advance Directive? Yes Yes No No No Yes Yes  Type of Estate agent of [place];Living will Healthcare Power of [place];Living will    Living will;Healthcare Power of [employer] Power of [place];Living will  Does patient want to make changes to medical advance directive?  No - Patient declined   No - Patient declined No - Patient declined No - Patient declined  Copy of Healthcare Power of Attorney in Chart? No - copy requested No - copy requested       Would patient like information on creating a medical advance directive?   No - Patient declined No - Patient declined No - Patient declined      Current Medications (verified) Outpatient Encounter Medications as of 04/20/2023  Medication Sig   ADVAIR DISKUS 250-50 MCG/ACT AEPB INHALE 1 PUFF INTO THE LUNGS EVERY 12 HOURS   aspirin 81 MG tablet Take 81 mg by mouth daily.    fludrocortisone (FLORINEF) 0.1 MG tablet Take 1 tablet (0.1 mg total) by mouth daily.   fluticasone (FLONASE) 50  MCG/ACT nasal spray Place 2 sprays into both nostrils daily as needed for allergies or rhinitis.   midodrine (PROAMATINE) 5 MG tablet Take 1 1/2 tablets (7.5mg ) at 8am and 1 tablet(5mg ) at 4pm by mouth with meals.   rosuvastatin (CRESTOR) 20 MG tablet TAKE 1 TABLET(20 MG) BY MOUTH DAILY AT 6 PM   cyanocobalamin 2000 MCG tablet Take 1,000 mcg by mouth daily. (Patient not taking: Reported on 04/20/2023)   EPINEPHrine (EPIPEN 2-PAK) 0.3 mg/0.3 mL IJ SOAJ injection Inject 0.3 mLs (0.3 mg total) into the muscle as needed for anaphylaxis. (Patient not taking: Reported on 04/20/2023)   No facility-administered encounter medications on file as of 04/20/2023.    Allergies (verified) Bee venom and Meclizine   History: Past Medical History:  Diagnosis Date   BRADYCARDIA 06/01/2007   Cataract    bil cataracts removed   COPD (chronic obstructive pulmonary disease) (HCC)    EMPHYSEMA 03/28/2008   Emphysema of lung (HCC)    GERD (gastroesophageal reflux disease)    HYPERGLYCEMIA 06/01/2007   HYPERLIPIDEMIA 06/01/2007   Hypertension    HYPERTENSION 01/19/2009   Peripheral arterial disease (HCC)    TIA (transient ischemic attack)    Past Surgical History:  Procedure Laterality Date   ABDOMINAL AORTOGRAM W/LOWER EXTREMITY N/A 03/05/2020   Procedure: ABDOMINAL AORTOGRAM W/  LOWER EXTREMITY;  Surgeon: Runell Gess, MD;  Location: [facility] INVASIVE  CV LAB;  Service: Cardiovascular;  Laterality: N/A;   CATARACT EXTRACTION, BILATERAL     COLONOSCOPY     ENDARTERECTOMY FEMORAL Right 04/03/2020   Procedure: Right Femoral Endarterectomy;  Surgeon: Sherren Kerns, MD;  Location: [facility] OR;  Service: Vascular;  Laterality: Right;   ENDARTERECTOMY FEMORAL Left 09/10/2020   Procedure: ENDARTERECTOMY FEMORAL LEFT;  Surgeon: Sherren Kerns, MD;  Location: [facility] OR;  Service: Vascular;  Laterality: Left;   EYE SURGERY     Bilaterl cataracts   PATCH ANGIOPLASTY Left 09/10/2020   Procedure: PATCH ANGIOPLASTY OF LEFT FEMORAL  ARTERY USING HEMASHIELD PLATINUM FINESSE PATCH;  Surgeon: Sherren Kerns, MD;  Location: [facility] OR;  Service: Vascular;  Laterality: Left;   POLYPECTOMY     TONSILLECTOMY AND ADENOIDECTOMY     Family History  Problem Relation Age of Onset   Cancer Mother        "Male" cancer   Uterine cancer Mother    Heart disease Father    Colon cancer Neg Hx    Esophageal cancer Neg Hx    Rectal cancer Neg Hx    Stomach cancer Neg Hx    Liver cancer Neg Hx    Pancreatic cancer Neg Hx    Prostate cancer Neg Hx    Stroke Neg Hx    Social History   Socioeconomic History   Marital status: Widowed    Spouse name: Not on file   Number of children: Not on file   Years of education: Not on file   Highest education level: Not on file  Occupational History   Occupation: Retired   Tobacco Use   Smoking status: Former    Packs/day: 0.50    Years: 40.00    Additional pack years: 0.00    Total pack years: 20.00    Types: Cigarettes    Quit date: 09/26/2017    Years since quitting: 5.5   Smokeless tobacco: Never   Tobacco comments:    stopped 2018  Vaping Use   Vaping Use: Never used  Substance and Sexual Activity   Alcohol use: No   Drug use: No   Sexual activity: Not on file  Other Topics Concern   Not on file  Social History Narrative   Lives at home with wife   Right handed   Caffeine: drinks de-caf drinks, diet drinks if its a coke.   Social Determinants of Health   Financial Resource Strain: Low Risk  (04/20/2023)   Overall Financial Resource Strain (CARDIA)    Difficulty of Paying Living Expenses: Not hard at all  Food Insecurity: No Food Insecurity (04/20/2023)   Hunger Vital Sign    Worried About Running Out of Food in the Last Year: Never true    Ran Out of Food in the Last Year: Never true  Transportation Needs: No Transportation Needs (04/20/2023)   PRAPARE - Administrator, Civil Service (Medical): No    Lack of Transportation (Non-Medical): No  Physical  Activity: Insufficiently Active (04/20/2023)   Exercise Vital Sign    Days of Exercise per Week: 3 days    Minutes of Exercise per Session: 30 min  Stress: No Stress Concern Present (04/20/2023)   [employer] of Occupational Health - Occupational Stress Questionnaire    Feeling of Stress : Not at all  Social Connections: Moderately Isolated (04/20/2023)   Social Connection and Isolation Panel [NHANES]    Frequency of Communication with Friends and Family: More than three times a  week    Frequency of Social Gatherings with Friends and Family: More than three times a week    Attends Religious Services: More than 4 times per year    Active Member of [employer] or Organizations: No    Attends Banker Meetings: Never    Marital Status: Widowed    Tobacco Counseling Counseling given: Not Answered Tobacco comments: stopped 2018   Clinical Intake:  Pre-visit preparation completed: Yes  Pain : No/denies pain     BMI - recorded: 27.89 Nutritional Status: BMI 25 -29 Overweight Nutritional Risks: None Diabetes: No  How often do you need to have someone help you when you read instructions, pamphlets, or other written materials from your doctor or pharmacy?: 1 - Never  Interpreter Needed?: No  Information entered by :: Lanier Ensign, LPN   Activities of Daily Living    04/20/2023    4:12 PM  In your present state of health, do you have any difficulty performing the following activities:  Hearing? 0  Vision? 0  Difficulty concentrating or making decisions? 0  Walking or climbing stairs? 0  Dressing or bathing? 0  Doing errands, shopping? 0  Preparing Food and eating ? N  Using the Toilet? N  In the past six months, have you accidently leaked urine? N  Do you have problems with loss of bowel control? N  Managing your Medications? N  Managing your Finances? N  Housekeeping or managing your Housekeeping? N    Patient Care Team: Ardith Dark, MD as PCP - General  (Family Medicine) Tonny Bollman, MD as PCP - Cardiology (Cardiology) Duke Salvia, MD as PCP - Electrophysiology (Cardiology) Steward Ros, Arva Chafe, MD (Internal Medicine)  Indicate any recent Medical Services you may have received from other than [facility] providers in the past year (date may be approximate).     Assessment:   This is a routine wellness examination for [place].  Hearing/Vision screen Hearing Screening - Comments:: Pt denies ANY hearing issues  Vision Screening - Comments:: Pt follows up with male provider near [place] for annual eye exams   Dietary issues and exercise activities discussed:     Goals Addressed               This Visit's Progress     Patient Stated (pt-stated)        None at this time        Depression Screen    04/20/2023    4:10 PM 02/27/2023    1:33 PM 12/25/2022    1:25 PM 02/03/2022   11:13 AM 02/19/2021    2:18 PM 01/10/2020    1:24 PM 12/30/2018   11:31 AM  PHQ 2/9 Scores  PHQ - 2 Score 0 0 0 0 0 0 0    Fall Risk    04/20/2023    4:12 PM 02/27/2023    1:34 PM 12/25/2022    1:25 PM 02/03/2022   11:13 AM 02/19/2021    2:18 PM  Fall Risk   Falls in the past year? 0 0 0 0 0  Number falls in past yr: 0 0 0 0   Injury with Fall? 0 0 0 0   Risk for fall due to : Impaired vision No Fall Risks No Fall Risks No Fall Risks   Follow up Falls prevention discussed        MEDICARE RISK AT HOME:  Medicare Risk at Home - 04/20/23 1613     Any stairs in  or around the home? Yes    If so, are there any without handrails? No    Home free of loose throw rugs in walkways, pet beds, electrical cords, etc? Yes    Adequate lighting in your home to reduce risk of falls? Yes    Life alert? No    Use of a cane, walker or w/c? No    Grab bars in the bathroom? Yes    Shower chair or bench in shower? No    Elevated toilet seat or a handicapped toilet? No             TIMED UP AND GO:  Was the test performed?  No    Cognitive Function:         04/20/2023    4:13 PM  6CIT Screen  What Year? 0 points  What month? 0 points  What time? 0 points  Count back from 20 0 points  Months in reverse 0 points  Repeat phrase 0 points  Total Score 0 points    Immunizations Immunization History  Administered Date(s) Administered   Fluad Quad(high Dose 65+) 07/23/2020   Influenza Inj Mdck Quad Pf 07/19/2019   Influenza Split 07/11/2016, 07/13/2022   Influenza Whole 08/15/2002, 09/21/2007, 08/03/2008, 08/08/2009, 08/01/2010   Influenza, High Dose Seasonal PF 06/20/2014, 08/30/2018   Influenza,inj,Quad PF,6+ Mos 06/28/2013, 08/28/2015   Influenza-Unspecified 08/14/2011, 07/18/2017, 07/19/2019   PFIZER(Purple Top)SARS-COV-2 Vaccination 11/03/2019, 11/24/2019, 07/23/2020   PNEUMOCOCCAL CONJUGATE-20 03/26/2022   Pneumococcal Conjugate-13 10/23/2015   Pneumococcal Polysaccharide-23 10/14/2002   Td 07/13/2001   Tdap 07/14/2011   Unspecified SARS-COV-2 Vaccination 04/29/2021, 08/11/2022   Zoster Recombinant(Shingrix) 08/12/2021, 10/16/2021   Zoster, Live 06/14/2015    TDAP status: Due, Education has been provided regarding the importance of this vaccine. Advised may receive this vaccine at local pharmacy or Health Dept. Aware to provide a copy of the vaccination record if obtained from local pharmacy or Health Dept. Verbalized acceptance and understanding.  Flu Vaccine status: Up to date  Pneumococcal vaccine status: Up to date  Covid-19 vaccine status: Completed vaccines  Qualifies for Shingles Vaccine? Yes   Zostavax completed Yes   Shingrix Completed?: Yes  Screening Tests Health Maintenance  Topic Date Due   DTaP/Tdap/Td (3 - Td or Tdap) 07/13/2021   COVID-19 Vaccine (6 - 2023-24 season) 10/06/2022   INFLUENZA VACCINE  05/14/2023   Medicare Annual Wellness (AWV)  04/19/2024   Pneumonia Vaccine 35+ Years old  Completed   Zoster Vaccines- Shingrix  Completed   HPV VACCINES  Aged Out   Colonoscopy  Discontinued     Health Maintenance  Health Maintenance Due  Topic Date Due   DTaP/Tdap/Td (3 - Td or Tdap) 07/13/2021   COVID-19 Vaccine (6 - 2023-24 season) 10/06/2022    Colorectal cancer screening: No longer required.    Additional Screening:   Vision Screening: Recommended annual ophthalmology exams for early detection of glaucoma and other disorders of the eye. Is the patient up to date with their annual eye exam?  Yes  Who is the provider or what is the name of the office in which the patient attends annual eye exams? Provider on [address]  If pt is not established with a provider, would they like to be referred to a provider to establish care? No .   Dental Screening: Recommended annual dental exams for proper oral hygiene    Community Resource Referral / Chronic Care Management: CRR required this visit?  No   CCM required this visit?  No     Plan:     I have personally reviewed and noted the following in the patient's chart:   Medical and social history Use of alcohol, tobacco or illicit drugs  Current medications and supplements including opioid prescriptions. Patient is not currently taking opioid prescriptions. Functional ability and status Nutritional status Physical activity Advanced directives List of other physicians Hospitalizations, surgeries, and ER visits in previous 12 months Vitals Screenings to include cognitive, depression, and falls Referrals and appointments  In addition, I have reviewed and discussed with patient certain preventive protocols, quality metrics, and best practice recommendations. A written personalized care plan for preventive services as well as general preventive health recommendations were provided to patient.     Marzella Schlein, LPN   10/18/1094   After Visit Summary: (MyChart) Due to this being a telephonic visit, the after visit summary with patients personalized plan was offered to patient via MyChart   Nurse Notes: none

## 2023-04-20 NOTE — Patient Instructions (Signed)
Bryan Armstrong , Thank you for taking time to come for your Medicare Wellness Visit. I appreciate your ongoing commitment to your health goals. Please review the following plan we discussed and let me know if I can assist you in the future.   These are the goals we discussed:  Goals       Patient Stated (pt-stated)      None at this time         This is a list of the screening recommended for you and due dates:  Health Maintenance  Topic Date Due   DTaP/Tdap/Td vaccine (3 - Td or Tdap) 07/13/2021   COVID-19 Vaccine (6 - 2023-24 season) 10/06/2022   Flu Shot  05/14/2023   Medicare Annual Wellness Visit  04/19/2024   Pneumonia Vaccine  Completed   Zoster (Shingles) Vaccine  Completed   HPV Vaccine  Aged Out   Colon Cancer Screening  Discontinued    Advanced directives: Please bring a copy of your health care power of attorney and living will to the office at your convenience.  Conditions/risks identified: none at this time   Next appointment: Follow up in one year for your annual wellness visit.   Preventive Care 78 Years and Older, Male  Preventive care refers to lifestyle choices and visits with your health care provider that can promote health and wellness. What does preventive care include? A yearly physical exam. This is also called an annual well check. Dental exams once or twice a year. Routine eye exams. Ask your health care provider how often you should have your eyes checked. Personal lifestyle choices, including: Daily care of your teeth and gums. Regular physical activity. Eating a healthy diet. Avoiding tobacco and drug use. Limiting alcohol use. Practicing safe sex. Taking low doses of aspirin every day. Taking vitamin and mineral supplements as recommended by your health care provider. What happens during an annual well check? The services and screenings done by your health care provider during your annual well check will depend on your age, overall health,  lifestyle risk factors, and family history of disease. Counseling  Your health care provider may ask you questions about your: Alcohol use. Tobacco use. Drug use. Emotional well-being. Home and relationship well-being. Sexual activity. Eating habits. History of falls. Memory and ability to understand (cognition). Work and work Astronomer. Screening  You may have the following tests or measurements: Height, weight, and BMI. Blood pressure. Lipid and cholesterol levels. These may be checked every 5 years, or more frequently if you are over 7 years old. Skin check. Lung cancer screening. You may have this screening every year starting at age 49 if you have a 30-pack-year history of smoking and currently smoke or have quit within the past 15 years. Fecal occult blood test (FOBT) of the stool. You may have this test every year starting at age 80. Flexible sigmoidoscopy or colonoscopy. You may have a sigmoidoscopy every 5 years or a colonoscopy every 10 years starting at age 67. Prostate cancer screening. Recommendations will vary depending on your family history and other risks. Hepatitis C blood test. Hepatitis B blood test. Sexually transmitted disease (STD) testing. Diabetes screening. This is done by checking your blood sugar (glucose) after you have not eaten for a while (fasting). You may have this done every 1-3 years. Abdominal aortic aneurysm (AAA) screening. You may need this if you are a current or former smoker. Osteoporosis. You may be screened starting at age 34 if you are at high  risk. Talk with your health care provider about your test results, treatment options, and if necessary, the need for more tests. Vaccines  Your health care provider may recommend certain vaccines, such as: Influenza vaccine. This is recommended every year. Tetanus, diphtheria, and acellular pertussis (Tdap, Td) vaccine. You may need a Td booster every 10 years. Zoster vaccine. You may need this  after age 18. Pneumococcal 13-valent conjugate (PCV13) vaccine. One dose is recommended after age 63. Pneumococcal polysaccharide (PPSV23) vaccine. One dose is recommended after age 66. Talk to your health care provider about which screenings and vaccines you need and how often you need them. This information is not intended to replace advice given to you by your health care provider. Make sure you discuss any questions you have with your health care provider. Document Released: 10/26/2015 Document Revised: 06/18/2016 Document Reviewed: 07/31/2015 Elsevier Interactive Patient Education  2017 [employer].  Fall Prevention in the Home Falls can cause injuries. They can happen to people of all ages. There are many things you can do to make your home safe and to help prevent falls. What can I do on the outside of my home? Regularly fix the edges of walkways and driveways and fix any cracks. Remove anything that might make you trip as you walk through a door, such as a raised step or threshold. Trim any bushes or trees on the path to your home. Use bright outdoor lighting. Clear any walking paths of anything that might make someone trip, such as rocks or tools. Regularly check to see if handrails are loose or broken. Make sure that both sides of any steps have handrails. Any raised decks and porches should have guardrails on the edges. Have any leaves, snow, or ice cleared regularly. Use sand or salt on walking paths during winter. Clean up any spills in your garage right away. This includes oil or grease spills. What can I do in the bathroom? Use night lights. Install grab bars by the toilet and in the tub and shower. Do not use towel bars as grab bars. Use non-skid mats or decals in the tub or shower. If you need to sit down in the shower, use a plastic, non-slip stool. Keep the floor dry. Clean up any water that spills on the floor as soon as it happens. Remove soap buildup in the tub or  shower regularly. Attach bath mats securely with double-sided non-slip rug tape. Do not have throw rugs and other things on the floor that can make you trip. What can I do in the bedroom? Use night lights. Make sure that you have a light by your bed that is easy to reach. Do not use any sheets or blankets that are too big for your bed. They should not hang down onto the floor. Have a firm chair that has side arms. You can use this for support while you get dressed. Do not have throw rugs and other things on the floor that can make you trip. What can I do in the kitchen? Clean up any spills right away. Avoid walking on wet floors. Keep items that you use a lot in easy-to-reach places. If you need to reach something above you, use a strong step stool that has a grab bar. Keep electrical cords out of the way. Do not use floor polish or wax that makes floors slippery. If you must use wax, use non-skid floor wax. Do not have throw rugs and other things on the floor that can  make you trip. What can I do with my stairs? Do not leave any items on the stairs. Make sure that there are handrails on both sides of the stairs and use them. Fix handrails that are broken or loose. Make sure that handrails are as long as the stairways. Check any carpeting to make sure that it is firmly attached to the stairs. Fix any carpet that is loose or worn. Avoid having throw rugs at the top or bottom of the stairs. If you do have throw rugs, attach them to the floor with carpet tape. Make sure that you have a light switch at the top of the stairs and the bottom of the stairs. If you do not have them, ask someone to add them for you. What else can I do to help prevent falls? Wear shoes that: Do not have high heels. Have rubber bottoms. Are comfortable and fit you well. Are closed at the toe. Do not wear sandals. If you use a stepladder: Make sure that it is fully opened. Do not climb a closed stepladder. Make  sure that both sides of the stepladder are locked into place. Ask someone to hold it for you, if possible. Clearly mark and make sure that you can see: Any grab bars or handrails. First and last steps. Where the edge of each step is. Use tools that help you move around (mobility aids) if they are needed. These include: Canes. Walkers. Scooters. Crutches. Turn on the lights when you go into a dark area. Replace any light bulbs as soon as they burn out. Set up your furniture so you have a clear path. Avoid moving your furniture around. If any of your floors are uneven, fix them. If there are any pets around you, be aware of where they are. Review your medicines with your doctor. Some medicines can make you feel dizzy. This can increase your chance of falling. Ask your doctor what other things that you can do to help prevent falls. This information is not intended to replace advice given to you by your health care provider. Make sure you discuss any questions you have with your health care provider. Document Released: 07/26/2009 Document Revised: 03/06/2016 Document Reviewed: 11/03/2014 Elsevier Interactive Patient Education  2017 [employer].

## 2023-04-21 NOTE — Therapy (Signed)
OUTPATIENT PHYSICAL THERAPY VESTIBULAR EVALUATION     Patient Name: Bryan Armstrong MRN: [id number] DOB:Aug 02, 1935, 88 y.o., male Today's Date: 04/21/2023  END OF SESSION:   Past Medical History:  Diagnosis Date   BRADYCARDIA 06/01/2007   Cataract    bil cataracts removed   COPD (chronic obstructive pulmonary disease) (HCC)    EMPHYSEMA 03/28/2008   Emphysema of lung (HCC)    GERD (gastroesophageal reflux disease)    HYPERGLYCEMIA 06/01/2007   HYPERLIPIDEMIA 06/01/2007   Hypertension    HYPERTENSION 01/19/2009   Peripheral arterial disease (HCC)    TIA (transient ischemic attack)    Past Surgical History:  Procedure Laterality Date   ABDOMINAL AORTOGRAM W/LOWER EXTREMITY N/A 03/05/2020   Procedure: ABDOMINAL AORTOGRAM W/  LOWER EXTREMITY;  Surgeon: Runell Gess, MD;  Location: [facility];  Service: Cardiovascular;  Laterality: N/A;   CATARACT EXTRACTION, BILATERAL     COLONOSCOPY     ENDARTERECTOMY FEMORAL Right 04/03/2020   Procedure: Right Femoral Endarterectomy;  Surgeon: Sherren Kerns, MD;  Location: [facility] OR;  Service: Vascular;  Laterality: Right;   ENDARTERECTOMY FEMORAL Left 09/10/2020   Procedure: ENDARTERECTOMY FEMORAL LEFT;  Surgeon: Sherren Kerns, MD;  Location: [facility] OR;  Service: Vascular;  Laterality: Left;   EYE SURGERY     Bilaterl cataracts   PATCH ANGIOPLASTY Left 09/10/2020   Procedure: PATCH ANGIOPLASTY OF LEFT FEMORAL ARTERY USING HEMASHIELD PLATINUM FINESSE PATCH;  Surgeon: Sherren Kerns, MD;  Location: [facility] OR;  Service: Vascular;  Laterality: Left;   POLYPECTOMY     TONSILLECTOMY AND ADENOIDECTOMY     Patient Active Problem List   Diagnosis Date Noted   Vasovagal syncope 08/12/2022   Vertigo 06/09/2022   Osteoarthritis 02/24/2022   Vitamin B 12 deficiency 10/03/2021   Orthostatic hypotension 04/16/2021   Neuropathy 02/19/2021   Abnormal CXR 02/13/2021   BPV (benign positional vertigo) 02/13/2021   Hypotension 06/19/2020   TIA  (transient ischemic attack) 04/18/2020   PAD (peripheral artery disease) (HCC) 04/03/2020   Neck pain 12/30/2018   GERD (gastroesophageal reflux disease) 09/28/2018   Peripheral arterial disease (HCC) 06/15/2018   Former smoker 06/15/2018   History of colonic polyps 01/07/2018   Allergic rhinitis 05/31/2013   Urticaria 11/02/2011   COPD, group A, by GOLD 2017 classification (HCC) 03/28/2008   Dyslipidemia 06/01/2007   Bradycardia 06/01/2007   HYPERGLYCEMIA 06/01/2007    PCP: Ardith Dark, MD   REFERRING PROVIDER: Micki Riley, MD  REFERRING DIAG: R42 (ICD-10-CM) - Dizziness  THERAPY DIAG:  No diagnosis found.  ONSET DATE: 04/08/2023  Rationale for Evaluation and Treatment: {HABREHAB:27488}  SUBJECTIVE:   SUBJECTIVE STATEMENT: *** Pt accompanied by: {accompnied:27141}  PERTINENT HISTORY: PMH: posterior circulation TIA in July 2021 from small vessel disease, Neuropathy, hx of BPPV, Bradycardia, COPD, Emphysema, GERD, HLD, HTN, PAD   He continues to have intermittent transient episodes of dizziness, lightheadedness with right eye altitudinal vision loss in the lower occurring once a month or so.  These episodes last 5 to 7 minutes.  There is no clear triggers but he feels his blood pressure is low.   PAIN:  Are you having pain? {OPRCPAIN:27236}  PRECAUTIONS: {Therapy precautions:24002}  WEIGHT BEARING RESTRICTIONS: {Yes ***/No:24003}  FALLS: Has patient fallen in last 6 months? {fallsyesno:27318}  LIVING ENVIRONMENT: Lives with: {OPRC lives with:25569::"lives with their family"} Lives in: {Lives in:25570} Stairs: {opstairs:27293} Has following equipment at home: {Assistive devices:23999}  PLOF: {PLOF:24004}  PATIENT GOALS: ***  OBJECTIVE:  DIAGNOSTIC FINDINGS: ***  COGNITION: Overall cognitive status: {cognition:24006}   SENSATION: {sensation:27233}  EDEMA:  {edema:24020}  MUSCLE TONE:  {LE tone:25568}  DTRs:  {DTR SITE:24025}  POSTURE:   {posture:25561}  Cervical ROM:    {AROM/PROM:27142} A/PROM (deg) eval  Flexion   Extension   Right lateral flexion   Left lateral flexion   Right rotation   Left rotation   (Blank rows = not tested)  STRENGTH: ***  LOWER EXTREMITY MMT:   MMT Right eval Left eval  Hip flexion    Hip abduction    Hip adduction    Hip internal rotation    Hip external rotation    Knee flexion    Knee extension    Ankle dorsiflexion    Ankle plantarflexion    Ankle inversion    Ankle eversion    (Blank rows = not tested)  BED MOBILITY:  {Bed mobility:24027}  TRANSFERS: Assistive device utilized: {Assistive devices:23999}  Sit to stand: {Levels of assistance:24026} Stand to sit: {Levels of assistance:24026} Chair to chair: {Levels of assistance:24026} Floor: {Levels of assistance:24026}  RAMP: {Levels of assistance:24026}  CURB: {Levels of assistance:24026}  GAIT: Gait pattern: {gait characteristics:25376} Distance walked: *** Assistive device utilized: {Assistive devices:23999} Level of assistance: {Levels of assistance:24026} Comments: ***  FUNCTIONAL TESTS:  {Functional tests:24029}  PATIENT SURVEYS:  {rehab surveys:24030}  VESTIBULAR ASSESSMENT:  GENERAL OBSERVATION: ***   SYMPTOM BEHAVIOR:  Subjective history: ***  Non-Vestibular symptoms: {nonvestibular symptoms:25260}  Type of dizziness: {Type of Dizziness:25255}  Frequency: ***  Duration: ***  Aggravating factors: {Aggravating Factors:25258}  Relieving factors: {Relieving Factors:25259}  Progression of symptoms: {DESC; BETTER/WORSE:18575}  OCULOMOTOR EXAM:  Ocular Alignment: {Ocular Alignment:25262}  Ocular ROM: {RANGE OF MOTION:21649}  Spontaneous Nystagmus: {Spontaneous nystagmus:25263}  Gaze-Induced Nystagmus: {gaze-induced nystagmus:25264}  Smooth Pursuits: {smooth pursuit:25265}  Saccades: {saccades:25266}  Convergence/Divergence: *** cm   FRENZEL - FIXATION SUPRESSED:  Ocular Alignment:  {Ocular Alignment:25262}  Spontaneous Nystagmus: {Spontaneous nystagmus:25263}  Gaze-Induced Nystagmus: {gaze-induced nystagmus:25264}  Horizontal head shaking - induced nystagmus: {head shaking induced nystagmus:25267}  Vertical head shaking - induced nystagmus: {head shaking induced nystagmus:25267}  Positional tests: {Positional tests:25271}  Pressure tests: {frenzel pressure tests:25268}  VESTIBULAR - OCULAR REFLEX:   Slow VOR: {slow VOR:25290}  VOR Cancellation: {vor cancellation:25291}  Head-Impulse Test: {head impulse test:25272}  Dynamic Visual Acuity: {dynamic visual acuity:25273}   POSITIONAL TESTING: {Positional tests:25271}  MOTION SENSITIVITY:  Motion Sensitivity Quotient Intensity: 0 = none, 1 = Lightheaded, 2 = Mild, 3 = Moderate, 4 = Severe, 5 = Vomiting  Intensity  1. Sitting to supine   2. Supine to L side   3. Supine to R side   4. Supine to sitting   5. L Hallpike-Dix   6. Up from L    7. R Hallpike-Dix   8. Up from R    9. Sitting, head tipped to L knee   10. Head up from L knee   11. Sitting, head tipped to R knee   12. Head up from R knee   13. Sitting head turns x5   14.Sitting head nods x5   15. In stance, 180 turn to L    16. In stance, 180 turn to R     OTHOSTATICS: {Exam; orthostatics:31331}  FUNCTIONAL GAIT: {Functional tests:24029}   VESTIBULAR TREATMENT:  DATE: ***  Canalith Repositioning:  {Canalith Repositioning:25283} Gaze Adaptation:  {gaze adaptation:25286} Habituation:  {habituation:25288} Other: ***  PATIENT EDUCATION: Education details: *** Person educated: {Person educated:25204} Education method: {Education Method:25205} Education comprehension: {Education Comprehension:25206}  HOME EXERCISE PROGRAM:  GOALS: Goals reviewed with patient? {yes/no:20286}  SHORT TERM GOALS: Target date: ***  *** Baseline: Goal  status: {GOALSTATUS:25110}  2.  *** Baseline:  Goal status: {GOALSTATUS:25110}  3.  *** Baseline:  Goal status: {GOALSTATUS:25110}  4.  *** Baseline:  Goal status: {GOALSTATUS:25110}  5.  *** Baseline:  Goal status: {GOALSTATUS:25110}  6.  *** Baseline:  Goal status: {GOALSTATUS:25110}  LONG TERM GOALS: Target date: ***  *** Baseline:  Goal status: {GOALSTATUS:25110}  2.  *** Baseline:  Goal status: {GOALSTATUS:25110}  3.  *** Baseline:  Goal status: {GOALSTATUS:25110}  4.  *** Baseline:  Goal status: {GOALSTATUS:25110}  5.  *** Baseline:  Goal status: {GOALSTATUS:25110}  6.  *** Baseline:  Goal status: {GOALSTATUS:25110}  ASSESSMENT:  CLINICAL IMPRESSION: Patient is a *** y.o. *** who was seen today for physical therapy evaluation and treatment for ***.   OBJECTIVE IMPAIRMENTS: {opptimpairments:25111}.   ACTIVITY LIMITATIONS: {activitylimitations:27494}  PARTICIPATION LIMITATIONS: {participationrestrictions:25113}  PERSONAL FACTORS: {Personal factors:25162} are also affecting patient's functional outcome.   REHAB POTENTIAL: {rehabpotential:25112}  CLINICAL DECISION MAKING: {clinical decision making:25114}  EVALUATION COMPLEXITY: {Evaluation complexity:25115}   PLAN:  PT FREQUENCY: {rehab frequency:25116}  PT DURATION: {rehab duration:25117}  PLANNED INTERVENTIONS: {rehab planned interventions:25118::"Therapeutic exercises","Therapeutic activity","Neuromuscular re-education","Balance training","Gait training","Patient/Family education","Self Care","Joint mobilization"}  PLAN FOR NEXT SESSION: Sherlie Ban, PT, DPT 04/21/23 9:04 AM

## 2023-04-22 ENCOUNTER — Ambulatory Visit: Payer: Medicare Other | Attending: Neurology | Admitting: Physical Therapy

## 2023-04-22 ENCOUNTER — Encounter: Payer: Self-pay | Admitting: Physical Therapy

## 2023-04-22 VITALS — BP 94/52 | HR 66

## 2023-04-22 DIAGNOSIS — R42 Dizziness and giddiness: Secondary | ICD-10-CM | POA: Diagnosis not present

## 2023-04-22 DIAGNOSIS — M6281 Muscle weakness (generalized): Secondary | ICD-10-CM | POA: Diagnosis not present

## 2023-04-22 DIAGNOSIS — R2681 Unsteadiness on feet: Secondary | ICD-10-CM | POA: Diagnosis not present

## 2023-04-22 DIAGNOSIS — R262 Difficulty in walking, not elsewhere classified: Secondary | ICD-10-CM | POA: Insufficient documentation

## 2023-04-27 ENCOUNTER — Telehealth: Payer: Self-pay | Admitting: Internal Medicine

## 2023-04-27 NOTE — Telephone Encounter (Signed)
Pt is calling to ask if he is ok to proceed with taking his medications before he has his carotids done on 7/19.  Informed the pt that he can take his medications prior to his carotid US appt and we do not advise people to hold these for this type test.   Pt verbalized understanding and agrees with this plan.  Pt was gracious for all the assistance provided.

## 2023-04-27 NOTE — Telephone Encounter (Signed)
Pt would like to know if he needs to keep taking his medicine before his tests on 7/19. Please advise.

## 2023-04-29 ENCOUNTER — Encounter: Payer: Self-pay | Admitting: Physical Therapy

## 2023-04-29 ENCOUNTER — Ambulatory Visit: Payer: Medicare Other | Admitting: Physical Therapy

## 2023-04-29 ENCOUNTER — Telehealth: Payer: Self-pay | Admitting: Internal Medicine

## 2023-04-29 VITALS — BP 158/86 | HR 58

## 2023-04-29 DIAGNOSIS — R2681 Unsteadiness on feet: Secondary | ICD-10-CM | POA: Diagnosis not present

## 2023-04-29 DIAGNOSIS — R42 Dizziness and giddiness: Secondary | ICD-10-CM | POA: Diagnosis not present

## 2023-04-29 DIAGNOSIS — R262 Difficulty in walking, not elsewhere classified: Secondary | ICD-10-CM | POA: Diagnosis not present

## 2023-04-29 DIAGNOSIS — M6281 Muscle weakness (generalized): Secondary | ICD-10-CM | POA: Diagnosis not present

## 2023-04-29 NOTE — Telephone Encounter (Signed)
Pt states he is having tests done on Friday and he has a few questions about the test that he would like to go over on his next appointment on 8/2. Please advise.

## 2023-04-29 NOTE — Telephone Encounter (Signed)
Spoke with pt who states he is having testing of transcranial doppler and carotid studies on 05/01/2023.  Pt is asking that Dr Graciela Husbands review these tests at pt's f/u appointment on 05/15/2023. Pt advised Dr Pearlean Brownie, the ordering provider should contact pt with results but Dr Graciela Husbands will be glad to review at pt's 05/15/2023 appointment. Pt verbalizes understanding and thanked Charity fundraiser for the call.

## 2023-04-29 NOTE — Patient Instructions (Signed)
Gaze Stabilization: Standing Feet Apart    Feet shoulder width apart, keeping eyes on target on wall __a few__ feet away, tilt head down 15-30 and move head side to side for __30__ seconds. Repeat while moving head up and down for _30___ seconds.  Perform 2-3 sets of each Do _2___ sessions per day.  Copyright  VHI. All rights reserved.   

## 2023-04-29 NOTE — Therapy (Signed)
OUTPATIENT PHYSICAL THERAPY VESTIBULAR TREATMENT     Patient Name: Bryan Armstrong MRN: [id number] DOB:October 15, 1934, 88 y.o., male Today's Date: 04/29/2023  END OF SESSION:  PT End of Session - 04/29/23 1233     Visit Number 2    Number of Visits 9    Date for PT Re-Evaluation 06/21/23    Authorization Type [employer] Medicare    PT Start Time 1231    PT Stop Time 1312    PT Time Calculation (min) 41 min    Equipment Utilized During Treatment Gait belt    Activity Tolerance Patient tolerated treatment well    Behavior During Therapy WFL for tasks assessed/performed             Past Medical History:  Diagnosis Date   BRADYCARDIA 06/01/2007   Cataract    bil cataracts removed   COPD (chronic obstructive pulmonary disease) (HCC)    EMPHYSEMA 03/28/2008   Emphysema of lung (HCC)    GERD (gastroesophageal reflux disease)    HYPERGLYCEMIA 06/01/2007   HYPERLIPIDEMIA 06/01/2007   Hypertension    HYPERTENSION 01/19/2009   Peripheral arterial disease (HCC)    TIA (transient ischemic attack)    Past Surgical History:  Procedure Laterality Date   ABDOMINAL AORTOGRAM W/LOWER EXTREMITY N/A 03/05/2020   Procedure: ABDOMINAL AORTOGRAM W/  LOWER EXTREMITY;  Surgeon: Runell Gess, MD;  Location: [facility];  Service: Cardiovascular;  Laterality: N/A;   CATARACT EXTRACTION, BILATERAL     COLONOSCOPY     ENDARTERECTOMY FEMORAL Right 04/03/2020   Procedure: Right Femoral Endarterectomy;  Surgeon: Sherren Kerns, MD;  Location: [facility] OR;  Service: Vascular;  Laterality: Right;   ENDARTERECTOMY FEMORAL Left 09/10/2020   Procedure: ENDARTERECTOMY FEMORAL LEFT;  Surgeon: Sherren Kerns, MD;  Location: [facility] OR;  Service: Vascular;  Laterality: Left;   EYE SURGERY     Bilaterl cataracts   PATCH ANGIOPLASTY Left 09/10/2020   Procedure: PATCH ANGIOPLASTY OF LEFT FEMORAL ARTERY USING HEMASHIELD PLATINUM FINESSE PATCH;  Surgeon: Sherren Kerns, MD;  Location: [facility] OR;  Service: Vascular;   Laterality: Left;   POLYPECTOMY     TONSILLECTOMY AND ADENOIDECTOMY     Patient Active Problem List   Diagnosis Date Noted   Vasovagal syncope 08/12/2022   Vertigo 06/09/2022   Osteoarthritis 02/24/2022   Vitamin B 12 deficiency 10/03/2021   Orthostatic hypotension 04/16/2021   Neuropathy 02/19/2021   Abnormal CXR 02/13/2021   BPV (benign positional vertigo) 02/13/2021   Hypotension 06/19/2020   TIA (transient ischemic attack) 04/18/2020   PAD (peripheral artery disease) (HCC) 04/03/2020   Neck pain 12/30/2018   GERD (gastroesophageal reflux disease) 09/28/2018   Peripheral arterial disease (HCC) 06/15/2018   Former smoker 06/15/2018   History of colonic polyps 01/07/2018   Allergic rhinitis 05/31/2013   Urticaria 11/02/2011   COPD, group A, by GOLD 2017 classification (HCC) 03/28/2008   Dyslipidemia 06/01/2007   Bradycardia 06/01/2007   HYPERGLYCEMIA 06/01/2007    PCP: Ardith Dark, MD   REFERRING PROVIDER: Micki Riley, MD  REFERRING DIAG: R42 (ICD-10-CM) - Dizziness  THERAPY DIAG:  Unsteadiness on feet  Muscle weakness (generalized)  Dizziness and giddiness  Difficulty in walking, not elsewhere classified  ONSET DATE: 04/08/2023  Rationale for Evaluation and Treatment: Rehabilitation  SUBJECTIVE:   SUBJECTIVE STATEMENT: Has his carotid ultrasound later this week. Wearing his compression shorts today. No falls. Wearing his R knee sleeve today. Reports having an off day yesterday with lightheadedness. Notices he  has to look down more.   Pt accompanied by: self  PERTINENT HISTORY: PMH: posterior circulation TIA in July 2021 from small vessel disease, Neuropathy, hx of BPPV, Bradycardia, COPD, Emphysema, GERD, HLD, HTN, PAD, orthostatic hypotension, vasovagal syncope  Per Dr. Pearlean Brownie: He continues to have intermittent transient episodes of dizziness, lightheadedness with right eye altitudinal vision loss in the lower occurring once a month or so.  These  episodes last 5 to 7 minutes.  There is no clear triggers but he feels his blood pressure is low.   PAIN:  Are you having pain? No  Vitals:   04/29/23 1237  BP: (!) 158/86  Pulse: (!) 58     PRECAUTIONS: Fall and Other: Orthostatics  WEIGHT BEARING RESTRICTIONS: No  FALLS: Has patient fallen in last 6 months? No and has had some almost falls where he feels faint  LIVING ENVIRONMENT: Lives with: lives alone Lives in: House/apartment- [employer] Stairs: 2 steps to get, railing  Has following equipment at home: None  PLOF: Independent and Leisure: Golf, last time playing was last fall  PATIENT GOALS: Wants to improve coordination, wants to be better off if he has a dizzy spell.   OBJECTIVE:   COGNITION: Overall cognitive status: Within functional limits for tasks assessed   SENSATION: Decreased sensation/numbness in BLE due to neuropathy    POSTURE:  Pt with forward flexed posture in standing    STRENGTH: Weakness noted with functional mobility in BLE; no formal MMT completed in today's visit    TRANSFERS: Assistive device utilized: None  Sit to stand: SBA Stand to sit: SBA   GAIT: Gait pattern:  Unsteadiness with gait and some veering, decreased stride length, trunk flexed, and wide BOS Distance walked: Clinic distances Assistive device utilized: None Level of assistance: SBA and CGA   PATIENT SURVEYS:  Hospital doctor did not capture     VESTIBULAR TREATMENT:                                                                                                   DATE: 04/29/23      M-CTSIB  Condition 1: Firm Surface, EO 30 Sec, Normal Sway  Condition 2: Firm Surface, EC 20 Sec, Mild Sway  Condition 3: Foam Surface, EO 30 Sec, Mild Sway  Condition 4: Foam Surface, EC 4-5 seconds incr forward lean      Gaze Adaptation: x1 Viewing Horizontal: Position: Standing, Time: 30 seconds, Reps: 2, and Comment: No dizziness, cues for continuous head movement  x1  Viewing Vertical:  Position: Standing, Time: 30 seconds, Reps: 2, and Comment: No dizziness  Access Code: [email or web address] URL: [email or web address] Date: 04/29/2023 Prepared by: Sherlie Ban  Reviewed previous HEP for balance, VOR and provided new printout   Exercises - Romberg Stance with Head Nods on Foam Pad  - 1 x daily - 5 x weekly - 2 sets - 10 reps - Romberg Stance on Foam Pad with Head Rotation  - 1 x daily - 5 x weekly - 2 sets - 10 reps - Tandem Walking with Counter Support  - 1 x daily -  5 x weekly - 1 sets - 3 reps - next to railing  - Walking with Head Rotation  - 1 x daily - 5 x weekly - 1 sets - 3 reps - next to railing  - Standing Balance with Eyes Closed on Foam  - 1 x daily - 5 x weekly - 3 sets - 30 hold  PATIENT EDUCATION: Education details: Results of mCTSIB,  Person educated: Patient Education method: Explanation, Demonstration, Verbal cues, and Handouts Education comprehension: verbalized understanding, returned demonstration, and verbal cues required  HOME EXERCISE PROGRAM: From previous bout of therapy: Standing VOR x1 x30seconds with feet apart,   Access Code: [email or web address] URL: [email or web address] Date: 04/29/2023 Prepared by: Sherlie Ban  Exercises - Romberg Stance with Head Nods on Foam Pad  - 1 x daily - 5 x weekly - 2 sets - 10 reps - Romberg Stance on Foam Pad with Head Rotation  - 1 x daily - 5 x weekly - 2 sets - 10 reps - Tandem Walking with Counter Support  - 1 x daily - 5 x weekly - 1 sets - 3 reps - Walking with Head Rotation  - 1 x daily - 5 x weekly - 1 sets - 3 reps - Standing Balance with Eyes Closed on Foam  - 1 x daily - 5 x weekly - 3 sets - 30 hold  GOALS: Goals reviewed with patient? Yes  SHORT TERM GOALS: Target date: ALL STGS = LTGS    LONG TERM GOALS: Target date: 05/20/2023  Pt will be independent with HEP for improved balance and vestibular input  Baseline: pt had not been performing previous  HEP - will review and update as appropriate.  Goal status: INITIAL  2.  FGA to be assessed with goal written.  Baseline:  Goal status: INITIAL  3.  Pt will improve condition 2 of mCTSIB to at least 30 seconds  Baseline: 20 seconds Goal status: INITIAL  4.  MSQ to be assessed with goal written.  Baseline:  Goal status: INITIAL  5.  Pt will improve condition 4 of mCTSIB to at least 15 seconds in order for improved vestibular input for balance.  Baseline: 4-5 seconds  Goal status: INITIAL    ASSESSMENT:  CLINICAL IMPRESSION: Assessed mCTSIB with pt only able to hold condition 2 for 20 seconds and condition 4 for 4-5 seconds before losing his balance, indicating decr vestibular input for balance and pt with reliance on visual system for balance. LTGs updated. Remainder of session focused on reviewing previous HEP for VOR and balance as pt had not been working on them since discharged from therapy June 2023. Pt tolerated well, will continue to progress towards LTGs.   OBJECTIVE IMPAIRMENTS: Abnormal gait, decreased activity tolerance, decreased balance, decreased knowledge of use of DME, difficulty walking, decreased strength, dizziness, impaired sensation, and postural dysfunction.   ACTIVITY LIMITATIONS: bending, squatting, stairs, and locomotion level  PARTICIPATION LIMITATIONS: community activity, yard work, and golf  PERSONAL FACTORS: Age, Behavior pattern, Past/current experiences, Time since onset of injury/illness/exacerbation, and 3+ comorbidities: posterior circulation TIA in July 2021 from small vessel disease, Neuropathy, hx of BPPV, Bradycardia, COPD, Emphysema, GERD, HLD, HTN, PAD, orthostatic hypotension, vasovagal syncope  are also affecting patient's functional outcome.   REHAB POTENTIAL: Fair due to co morbidities, orthostatics   CLINICAL DECISION MAKING: Evolving/moderate complexity  EVALUATION COMPLEXITY: Moderate   PLAN:  PT FREQUENCY: 2x/week  PT  DURATION: 8 weeks - anticipate just 4 weeks, due to delay in scheduling  PLANNED INTERVENTIONS: Therapeutic exercises, Therapeutic activity, Neuromuscular re-education, Balance training, Gait training, Patient/Family education, Self Care, Vestibular training, Canalith repositioning, DME instructions, Manual therapy, and Re-evaluation  PLAN FOR NEXT SESSION: CHECK BP!! Assess MSQ and FGA for balance and write goals. Work on vestibular input for balance, head motions, balance with vision removed. Trial cane for safety/balance    Sherlie Ban, PT, DPT 04/29/23 1:14 PM

## 2023-05-01 ENCOUNTER — Ambulatory Visit (HOSPITAL_COMMUNITY)
Admission: RE | Admit: 2023-05-01 | Discharge: 2023-05-01 | Disposition: A | Discharge status: Home / Self Care | Payer: Medicare Other | Source: Ambulatory Visit | Attending: Neurology | Admitting: Neurology

## 2023-05-01 DIAGNOSIS — R42 Dizziness and giddiness: Secondary | ICD-10-CM

## 2023-05-01 NOTE — Progress Notes (Signed)
Bilateral carotid ultrasound and TCD studies completed.   Please see CV Procedures for preliminary results.  Nolah Krenzer, RVT  1:51 PM 05/01/23

## 2023-05-05 ENCOUNTER — Telehealth: Payer: Self-pay | Admitting: Neurology

## 2023-05-05 NOTE — Telephone Encounter (Signed)
Pt stated he is following-up on results on cardio testing that was ordered my Dr.Seithi.

## 2023-05-06 ENCOUNTER — Encounter: Payer: Self-pay | Admitting: Physical Therapy

## 2023-05-06 ENCOUNTER — Ambulatory Visit: Payer: Medicare Other | Admitting: Physical Therapy

## 2023-05-06 VITALS — BP 137/74 | HR 61

## 2023-05-06 DIAGNOSIS — R262 Difficulty in walking, not elsewhere classified: Secondary | ICD-10-CM

## 2023-05-06 DIAGNOSIS — M6281 Muscle weakness (generalized): Secondary | ICD-10-CM | POA: Diagnosis not present

## 2023-05-06 DIAGNOSIS — R42 Dizziness and giddiness: Secondary | ICD-10-CM

## 2023-05-06 DIAGNOSIS — R2681 Unsteadiness on feet: Secondary | ICD-10-CM

## 2023-05-06 NOTE — Telephone Encounter (Signed)
Patient called in again today upset that he has not heard back from his test results. I let him know that we are waiting on results from Dr Pearlean Brownie. Patient states that his mychart states Dr Pearlean Brownie has already signed off on the testing. I advised that Dr Pearlean Brownie has not sent anything to the nurses regarding results, but once they receive them, they will give you a call. He is concerned that something will happen prior to him getting the results. He is asking for a call back today before 1:00 pm to discuss

## 2023-05-06 NOTE — Therapy (Signed)
OUTPATIENT PHYSICAL THERAPY VESTIBULAR TREATMENT     Patient Name: Bryan Armstrong MRN: [id number] DOB:[birth year], 88 y.o., male Today's Date: 05/06/2023  END OF SESSION:  PT End of Session - 05/06/23 1621     Visit Number 3    Number of Visits 9    Date for PT Re-Evaluation 06/21/23    Authorization Type [employer] Medicare    PT Start Time 1620    PT Stop Time 1659    PT Time Calculation (min) 39 min    Equipment Utilized During Treatment Gait belt    Activity Tolerance Patient tolerated treatment well    Behavior During Therapy WFL for tasks assessed/performed             Past Medical History:  Diagnosis Date   BRADYCARDIA 06/01/2007   Cataract    bil cataracts removed   COPD (chronic obstructive pulmonary disease) (HCC)    EMPHYSEMA 03/28/2008   Emphysema of lung (HCC)    GERD (gastroesophageal reflux disease)    HYPERGLYCEMIA 06/01/2007   HYPERLIPIDEMIA 06/01/2007   Hypertension    HYPERTENSION 01/19/2009   Peripheral arterial disease (HCC)    TIA (transient ischemic attack)    Past Surgical History:  Procedure Laterality Date   ABDOMINAL AORTOGRAM W/LOWER EXTREMITY N/A 03/05/2020   Procedure: ABDOMINAL AORTOGRAM W/  LOWER EXTREMITY;  Surgeon: Runell Gess, MD;  Location: [facility];  Service: Cardiovascular;  Laterality: N/A;   CATARACT EXTRACTION, BILATERAL     COLONOSCOPY     ENDARTERECTOMY FEMORAL Right 04/03/2020   Procedure: Right Femoral Endarterectomy;  Surgeon: Sherren Kerns, MD;  Location: [facility] OR;  Service: Vascular;  Laterality: Right;   ENDARTERECTOMY FEMORAL Left 09/10/2020   Procedure: ENDARTERECTOMY FEMORAL LEFT;  Surgeon: Sherren Kerns, MD;  Location: [facility] OR;  Service: Vascular;  Laterality: Left;   EYE SURGERY     Bilaterl cataracts   PATCH ANGIOPLASTY Left 09/10/2020   Procedure: PATCH ANGIOPLASTY OF LEFT FEMORAL ARTERY USING HEMASHIELD PLATINUM FINESSE PATCH;  Surgeon: Sherren Kerns, MD;  Location: [facility] OR;  Service: Vascular;   Laterality: Left;   POLYPECTOMY     TONSILLECTOMY AND ADENOIDECTOMY     Patient Active Problem List   Diagnosis Date Noted   Vasovagal syncope 08/12/2022   Vertigo 06/09/2022   Osteoarthritis 02/24/2022   Vitamin B 12 deficiency 10/03/2021   Orthostatic hypotension 04/16/2021   Neuropathy 02/19/2021   Abnormal CXR 02/13/2021   BPV (benign positional vertigo) 02/13/2021   Hypotension 06/19/2020   TIA (transient ischemic attack) 04/18/2020   PAD (peripheral artery disease) (HCC) 04/03/2020   Neck pain 12/30/2018   GERD (gastroesophageal reflux disease) 09/28/2018   Peripheral arterial disease (HCC) 06/15/2018   Former smoker 06/15/2018   History of colonic polyps 01/07/2018   Allergic rhinitis 05/31/2013   Urticaria 11/02/2011   COPD, group A, by GOLD 2017 classification (HCC) 03/28/2008   Dyslipidemia 06/01/2007   Bradycardia 06/01/2007   HYPERGLYCEMIA 06/01/2007    PCP: Ardith Dark, MD   REFERRING PROVIDER: Micki Riley, MD  REFERRING DIAG: R42 (ICD-10-CM) - Dizziness  THERAPY DIAG:  Unsteadiness on feet  Muscle weakness (generalized)  Dizziness and giddiness  Difficulty in walking, not elsewhere classified  ONSET DATE: 04/08/2023  Rationale for Evaluation and Treatment: Rehabilitation  SUBJECTIVE:   SUBJECTIVE STATEMENT: Feel like he tweaked his R knee a little bit with tandem gait, so have stopped doing that one. Got his ultrasounds done and has not yet gotten the results  yet from Dr. Pearlean Brownie.  Pt accompanied by: self  PERTINENT HISTORY: PMH: posterior circulation TIA in July 2021 from small vessel disease, Neuropathy, hx of BPPV, Bradycardia, COPD, Emphysema, GERD, HLD, HTN, PAD, orthostatic hypotension, vasovagal syncope  Per Dr. Pearlean Brownie: He continues to have intermittent transient episodes of dizziness, lightheadedness with right eye altitudinal vision loss in the lower occurring once a month or so.  These episodes last 5 to 7 minutes.  There is no  clear triggers but he feels his blood pressure is low.   PAIN:  Are you having pain? No  Vitals:   05/06/23 1625  BP: 137/74  Pulse: 61      PRECAUTIONS: Fall and Other: Orthostatics  WEIGHT BEARING RESTRICTIONS: No  FALLS: Has patient fallen in last 6 months? No and has had some almost falls where he feels faint  LIVING ENVIRONMENT: Lives with: lives alone Lives in: House/apartment- [employer] Stairs: 2 steps to get, railing  Has following equipment at home: None  PLOF: Independent and Leisure: Golf, last time playing was last fall  PATIENT GOALS: Wants to improve coordination, wants to be better off if he has a dizzy spell.   OBJECTIVE:   COGNITION: Overall cognitive status: Within functional limits for tasks assessed   SENSATION: Decreased sensation/numbness in BLE due to neuropathy    POSTURE:  Pt with forward flexed posture in standing    STRENGTH: Weakness noted with functional mobility in BLE; no formal MMT completed in today's visit    TRANSFERS: Assistive device utilized: None  Sit to stand: SBA Stand to sit: SBA   GAIT: Gait pattern:  Unsteadiness with gait and some veering, decreased stride length, trunk flexed, and wide BOS Distance walked: Clinic distances Assistive device utilized: None Level of assistance: SBA and CGA   PATIENT SURVEYS:  FOTO Staff did not capture     VESTIBULAR TREATMENT:                                                                                                   DATE: 05/06/23   NMR:  Sit <> stands with EC on level ground, 2 sets of 5 reps, initially with UE support > none. Then 2 sets of 5 reps on air ex with EC, pt needing cues for incr anterior weight shift. Min guard for balance  In corner on air ex: Wide BOS holding ball and performing golf swing motion for trunk rotation/diagonal movement with cues to look at ball 2 x 10 reps each side, initially going slower and then slightly incr speed. Pt needing min  guard for balance as pt would tend to lose balance anteriorly  Wide BOS holding ball and performing CW and CCW circles x10 reps each side, cues for posture throughout as pt with incr forward flexed posture  EC: wide BOS- x30 seconds static stance, x10 reps A/P weight shifting - pt more challenged going posteriorly and reporting incr R knee pain    On rockerboard in A/P direction: Static stance with tall posture x30 seconds Weight shifting for limits of stability, multiple reps with working on hip/ankle  strategy EO 10 reps head turns with trying to keep board still, pt reporting incr R knee pain afterwards and requesting to get off   PATIENT EDUCATION: Education details: Continue HEP Person educated: Patient Education method: Explanation, Demonstration, and Verbal cues Education comprehension: verbalized understanding, returned demonstration, and verbal cues required  HOME EXERCISE PROGRAM: From previous bout of therapy: Standing VOR x1 x30seconds with feet apart,   Access Code: [email or web address] URL: [email or web address] Date: 04/29/2023 Prepared by: Sherlie Ban  Exercises - Romberg Stance with Head Nods on Foam Pad  - 1 x daily - 5 x weekly - 2 sets - 10 reps - Romberg Stance on Foam Pad with Head Rotation  - 1 x daily - 5 x weekly - 2 sets - 10 reps - Tandem Walking with Counter Support  - 1 x daily - 5 x weekly - 1 sets - 3 reps - Walking with Head Rotation  - 1 x daily - 5 x weekly - 1 sets - 3 reps - Standing Balance with Eyes Closed on Foam  - 1 x daily - 5 x weekly - 3 sets - 30 hold  GOALS: Goals reviewed with patient? Yes  SHORT TERM GOALS: Target date: ALL STGS = LTGS    LONG TERM GOALS: Target date: 05/20/2023  Pt will be independent with HEP for improved balance and vestibular input  Baseline: pt had not been performing previous HEP - will review and update as appropriate.  Goal status: INITIAL  2.  FGA to be assessed with goal written.  Baseline:   Goal status: INITIAL  3.  Pt will improve condition 2 of mCTSIB to at least 30 seconds  Baseline: 20 seconds Goal status: INITIAL  4.  MSQ to be assessed with goal written.  Baseline:  Goal status: INITIAL  5.  Pt will improve condition 4 of mCTSIB to at least 15 seconds in order for improved vestibular input for balance.  Baseline: 4-5 seconds  Goal status: INITIAL    ASSESSMENT:  CLINICAL IMPRESSION: Pt's BP WNL for therapy and pt reporting no feelings of lightheadedness. Today's skilled session focused on vestibular input for balance with EC tasks, weight shifting, head motions. Pt enjoying balance tasks that relates to playing golf such as trunk rotations. Pt needing cues for posture throughout session as pt forwardly flexed with exercises. Pt reporting some R knee pain with exercises, like on the rockerboard at the end of session. Pt tolerated well, will continue to progress towards LTGs.   OBJECTIVE IMPAIRMENTS: Abnormal gait, decreased activity tolerance, decreased balance, decreased knowledge of use of DME, difficulty walking, decreased strength, dizziness, impaired sensation, and postural dysfunction.   ACTIVITY LIMITATIONS: bending, squatting, stairs, and locomotion level  PARTICIPATION LIMITATIONS: community activity, yard work, and golf  PERSONAL FACTORS: Age, Behavior pattern, Past/current experiences, Time since onset of injury/illness/exacerbation, and 3+ comorbidities: posterior circulation TIA in July 2021 from small vessel disease, Neuropathy, hx of BPPV, Bradycardia, COPD, Emphysema, GERD, HLD, HTN, PAD, orthostatic hypotension, vasovagal syncope  are also affecting patient's functional outcome.   REHAB POTENTIAL: Fair due to co morbidities, orthostatics   CLINICAL DECISION MAKING: Evolving/moderate complexity  EVALUATION COMPLEXITY: Moderate   PLAN:  PT FREQUENCY: 2x/week  PT DURATION: 8 weeks - anticipate just 4 weeks, due to delay in scheduling    PLANNED INTERVENTIONS: Therapeutic exercises, Therapeutic activity, Neuromuscular re-education, Balance training, Gait training, Patient/Family education, Self Care, Vestibular training, Canalith repositioning, DME instructions, Manual therapy, and Re-evaluation  PLAN FOR NEXT SESSION: CHECK BP!!  Assess MSQ and FGA for balance and write goals. Work on vestibular input for balance, head motions, balance with vision removed. Trial cane for safety/balance    Sherlie Ban, PT, DPT 05/06/23 5:04 PM

## 2023-05-08 ENCOUNTER — Ambulatory Visit: Payer: Medicare Other | Admitting: Physical Therapy

## 2023-05-08 ENCOUNTER — Encounter: Payer: Self-pay | Admitting: Physical Therapy

## 2023-05-08 VITALS — BP 131/68 | HR 59

## 2023-05-08 DIAGNOSIS — R42 Dizziness and giddiness: Secondary | ICD-10-CM | POA: Diagnosis not present

## 2023-05-08 DIAGNOSIS — R262 Difficulty in walking, not elsewhere classified: Secondary | ICD-10-CM | POA: Diagnosis not present

## 2023-05-08 DIAGNOSIS — M6281 Muscle weakness (generalized): Secondary | ICD-10-CM | POA: Diagnosis not present

## 2023-05-08 DIAGNOSIS — R2681 Unsteadiness on feet: Secondary | ICD-10-CM

## 2023-05-08 NOTE — Therapy (Signed)
OUTPATIENT PHYSICAL THERAPY VESTIBULAR TREATMENT     Patient Name: Bryan Armstrong MRN: [id number] DOB:January 08, 1935, 88 y.o., male Today's Date: 05/08/2023  END OF SESSION:  PT End of Session - 05/08/23 1451     Visit Number 4    Number of Visits 9    Date for PT Re-Evaluation 06/21/23    Authorization Type [employer] Medicare    PT Start Time 1450    PT Stop Time 1530    PT Time Calculation (min) 40 min    Equipment Utilized During Treatment Gait belt    Activity Tolerance Patient tolerated treatment well    Behavior During Therapy WFL for tasks assessed/performed             Past Medical History:  Diagnosis Date   BRADYCARDIA 06/01/2007   Cataract    bil cataracts removed   COPD (chronic obstructive pulmonary disease) (HCC)    EMPHYSEMA 03/28/2008   Emphysema of lung (HCC)    GERD (gastroesophageal reflux disease)    HYPERGLYCEMIA 06/01/2007   HYPERLIPIDEMIA 06/01/2007   Hypertension    HYPERTENSION 01/19/2009   Peripheral arterial disease (HCC)    TIA (transient ischemic attack)    Past Surgical History:  Procedure Laterality Date   ABDOMINAL AORTOGRAM W/LOWER EXTREMITY N/A 03/05/2020   Procedure: ABDOMINAL AORTOGRAM W/  LOWER EXTREMITY;  Surgeon: Runell Gess, MD;  Location: [facility];  Service: Cardiovascular;  Laterality: N/A;   CATARACT EXTRACTION, BILATERAL     COLONOSCOPY     ENDARTERECTOMY FEMORAL Right 04/03/2020   Procedure: Right Femoral Endarterectomy;  Surgeon: Sherren Kerns, MD;  Location: [facility] OR;  Service: Vascular;  Laterality: Right;   ENDARTERECTOMY FEMORAL Left 09/10/2020   Procedure: ENDARTERECTOMY FEMORAL LEFT;  Surgeon: Sherren Kerns, MD;  Location: [facility] OR;  Service: Vascular;  Laterality: Left;   EYE SURGERY     Bilaterl cataracts   PATCH ANGIOPLASTY Left 09/10/2020   Procedure: PATCH ANGIOPLASTY OF LEFT FEMORAL ARTERY USING HEMASHIELD PLATINUM FINESSE PATCH;  Surgeon: Sherren Kerns, MD;  Location: [facility] OR;  Service: Vascular;   Laterality: Left;   POLYPECTOMY     TONSILLECTOMY AND ADENOIDECTOMY     Patient Active Problem List   Diagnosis Date Noted   Vasovagal syncope 08/12/2022   Vertigo 06/09/2022   Osteoarthritis 02/24/2022   Vitamin B 12 deficiency 10/03/2021   Orthostatic hypotension 04/16/2021   Neuropathy 02/19/2021   Abnormal CXR 02/13/2021   BPV (benign positional vertigo) 02/13/2021   Hypotension 06/19/2020   TIA (transient ischemic attack) 04/18/2020   PAD (peripheral artery disease) (HCC) 04/03/2020   Neck pain 12/30/2018   GERD (gastroesophageal reflux disease) 09/28/2018   Peripheral arterial disease (HCC) 06/15/2018   Former smoker 06/15/2018   History of colonic polyps 01/07/2018   Allergic rhinitis 05/31/2013   Urticaria 11/02/2011   COPD, group A, by GOLD 2017 classification (HCC) 03/28/2008   Dyslipidemia 06/01/2007   Bradycardia 06/01/2007   HYPERGLYCEMIA 06/01/2007    PCP: Ardith Dark, MD   REFERRING PROVIDER: Micki Riley, MD  REFERRING DIAG: R42 (ICD-10-CM) - Dizziness  THERAPY DIAG:  Unsteadiness on feet  Dizziness and giddiness  Muscle weakness (generalized)  Difficulty in walking, not elsewhere classified  ONSET DATE: 04/08/2023  Rationale for Evaluation and Treatment: Rehabilitation  SUBJECTIVE:   SUBJECTIVE STATEMENT: Feeling a little lightheaded today. Took his BP before he left and it was fine. Legs felt stiff after exercises the other day. Still waiting to hear back from Dr.  Sethi  Pt accompanied by: self  PERTINENT HISTORY: PMH: posterior circulation TIA in July 2021 from small vessel disease, Neuropathy, hx of BPPV, Bradycardia, COPD, Emphysema, GERD, HLD, HTN, PAD, orthostatic hypotension, vasovagal syncope  Per Dr. Pearlean Brownie: He continues to have intermittent transient episodes of dizziness, lightheadedness with right eye altitudinal vision loss in the lower occurring once a month or so.  These episodes last 5 to 7 minutes.  There is no clear  triggers but he feels his blood pressure is low.   PAIN:  Are you having pain? No  Vitals:   05/08/23 1454  BP: 131/68  Pulse: (!) 59       PRECAUTIONS: Fall and Other: Orthostatics  WEIGHT BEARING RESTRICTIONS: No  FALLS: Has patient fallen in last 6 months? No and has had some almost falls where he feels faint  LIVING ENVIRONMENT: Lives with: lives alone Lives in: House/apartment- [employer] Stairs: 2 steps to get, railing  Has following equipment at home: None  PLOF: Independent and Leisure: Golf, last time playing was last fall  PATIENT GOALS: Wants to improve coordination, wants to be better off if he has a dizzy spell.   OBJECTIVE:   COGNITION: Overall cognitive status: Within functional limits for tasks assessed   SENSATION: Decreased sensation/numbness in BLE due to neuropathy    POSTURE:  Pt with forward flexed posture in standing    STRENGTH: Weakness noted with functional mobility in BLE; no formal MMT completed in today's visit    TRANSFERS: Assistive device utilized: None  Sit to stand: SBA Stand to sit: SBA   GAIT: Gait pattern:  Unsteadiness with gait and some veering, decreased stride length, trunk flexed, and wide BOS Distance walked: Clinic distances Assistive device utilized: None Level of assistance: SBA and CGA   PATIENT SURVEYS:  Hospital doctor did not capture     VESTIBULAR TREATMENT:                                                                                                   DATE: 05/08/23  Therapeutic Activity:  [facility] PT Assessment - 05/08/23 1455       Functional Gait  Assessment   Gait assessed  Yes    Gait Level Surface Walks 20 ft, slow speed, abnormal gait pattern, evidence for imbalance or deviates 10-15 in outside of the 12 in walkway width. Requires more than 7 sec to ambulate 20 ft.   9 seconds   Change in Gait Speed Able to change speed, demonstrates mild gait deviations, deviates 6-10 in outside of the 12 in  walkway width, or no gait deviations, unable to achieve a major change in velocity, or uses a change in velocity, or uses an assistive device.    Gait with Horizontal Head Turns Performs head turns smoothly with slight change in gait velocity (eg, minor disruption to smooth gait path), deviates 6-10 in outside 12 in walkway width, or uses an assistive device.    Gait with Vertical Head Turns Performs task with slight change in gait velocity (eg, minor disruption to smooth gait path), deviates 6 -  10 in outside 12 in walkway width or uses assistive device    Gait and Pivot Turn Pivot turns safely in greater than 3 sec and stops with no loss of balance, or pivot turns safely within 3 sec and stops with mild imbalance, requires small steps to catch balance.    Step Over Obstacle Is able to step over one shoe box (4.5 in total height) but must slow down and adjust steps to clear box safely. May require verbal cueing.    Gait with Narrow Base of Support Ambulates less than 4 steps heel to toe or cannot perform without assistance.   unable to try due to knee pain   Gait with Eyes Closed Walks 20 ft, uses assistive device, slower speed, mild gait deviations, deviates 6-10 in outside 12 in walkway width. Ambulates 20 ft in less than 9 sec but greater than 7 sec.   8.76 seconds   Ambulating Backwards Walks 20 ft, uses assistive device, slower speed, mild gait deviations, deviates 6-10 in outside 12 in walkway width.   17 seconds, pt reporting incr pain in knee   Steps Two feet to a stair, must use rail.   Needs to perform this way due to knee pain on RLE   Total Score 15    FGA comment: 15/30 = High Fall Risk            With stepping over obstacles tasks, pt almost tripping over L foot and needing mod A from therapist for balance recovery.    NMR:  Gaze Adaptation: x1 Viewing Horizontal: Position: Standing with feet hip width > together with busy background, Time: 30 seconds, Reps: 2, and Comment: no  dizziness, just feeling more off balanced with feet together x1 Viewing Vertical:  Position: Standing with feet hip width > together with busy background, Time: 30 seconds, Reps: 2, and Comment: no dizziness, just feeling more off balanced with feet together  In corner on air ex: EC with feet hip width > more narrow BOS 3 x 30 seconds, incr postural sway  EC with wide BOS > feet hip width 2 x 10 reps head nods and 2 x 10 reps head turns, cues to just move head vs. Whole body, more challenged with head nods and needing intermittent UE support for balance  On blue foam beam: side stepping down and back x2 reps, trying not to use UE support, pt more challenged going to the R due to R knee pain   PATIENT EDUCATION: Education details: Results of FGA today compared to previous bout of therapy, continue with HEP, discussed use of AD due to knee pain and fall risk (pt not wanting to use a cane, but would be agreeable to try a walker or rollator at next session) Person educated: Patient Education method: Explanation, Demonstration, and Verbal cues Education comprehension: verbalized understanding, returned demonstration, and verbal cues required  HOME EXERCISE PROGRAM: From previous bout of therapy: Standing VOR x1 x30seconds with feet apart,   Access Code: [email or web address] URL: [email or web address] Date: 04/29/2023 Prepared by: Sherlie Ban  Exercises - Romberg Stance with Head Nods on Foam Pad  - 1 x daily - 5 x weekly - 2 sets - 10 reps - Romberg Stance on Foam Pad with Head Rotation  - 1 x daily - 5 x weekly - 2 sets - 10 reps - Walking with Head Rotation  - 1 x daily - 5 x weekly - 1 sets - 3 reps - Standing Balance with Eyes Closed  on Foam  - 1 x daily - 5 x weekly - 3 sets - 30 hold  GOALS: Goals reviewed with patient? Yes  SHORT TERM GOALS: Target date: ALL STGS = LTGS    LONG TERM GOALS: Target date: 05/20/2023  Pt will be independent with HEP for improved balance and  vestibular input  Baseline: pt had not been performing previous HEP - will review and update as appropriate.  Goal status: INITIAL  2.  Pt will improve FGA to at least a 19/30 in order to demo decr fall risk.  Baseline: 15/30 Goal status: INITIAL  3.  Pt will improve condition 2 of mCTSIB to at least 30 seconds  Baseline: 20 seconds Goal status: INITIAL  4.  MSQ to be assessed with goal written.  Baseline: goal not needed Goal status: INITIAL  5.  Pt will improve condition 4 of mCTSIB to at least 15 seconds in order for improved vestibular input for balance.  Baseline: 4-5 seconds  Goal status: INITIAL    ASSESSMENT:  CLINICAL IMPRESSION:  Assessed the FGA today with pt scoring a 15/30, indicating a high fall risk. Pt limited with some tasks due to R knee pain. With previous bout of therapy about a year ago, pt scoring a 20/30 when discharged. LTG updated as appropriate. During stepping over a taller obstacle, pt needing mod A from therapist to prevent a fall due to not picking up his feet and it getting caught on the floor.  Pt with no dizziness today, pt just more limited by R knee pain with activities. Pt demonstrating improvement with EC tasks today. Will continue to progress towards LTGs.   OBJECTIVE IMPAIRMENTS: Abnormal gait, decreased activity tolerance, decreased balance, decreased knowledge of use of DME, difficulty walking, decreased strength, dizziness, impaired sensation, and postural dysfunction.   ACTIVITY LIMITATIONS: bending, squatting, stairs, and locomotion level  PARTICIPATION LIMITATIONS: community activity, yard work, and golf  PERSONAL FACTORS: Age, Behavior pattern, Past/current experiences, Time since onset of injury/illness/exacerbation, and 3+ comorbidities: posterior circulation TIA in July 2021 from small vessel disease, Neuropathy, hx of BPPV, Bradycardia, COPD, Emphysema, GERD, HLD, HTN, PAD, orthostatic hypotension, vasovagal syncope  are also  affecting patient's functional outcome.   REHAB POTENTIAL: Fair due to co morbidities, orthostatics   CLINICAL DECISION MAKING: Evolving/moderate complexity  EVALUATION COMPLEXITY: Moderate   PLAN:  PT FREQUENCY: 2x/week  PT DURATION: 8 weeks - anticipate just 4 weeks, due to delay in scheduling   PLANNED INTERVENTIONS: Therapeutic exercises, Therapeutic activity, Neuromuscular re-education, Balance training, Gait training, Patient/Family education, Self Care, Vestibular training, Canalith repositioning, DME instructions, Manual therapy, and Re-evaluation  PLAN FOR NEXT SESSION: CHECK BP!! Trial rollator to help with balance and knee pain. Or try L foot up brace to help with foot clearance   Work on vestibular input for balance, head motions, balance with vision removed.    Sherlie Ban, PT, DPT 05/08/23 3:39 PM

## 2023-05-08 NOTE — Telephone Encounter (Signed)
Pt has asked that it be noted he has called again asking he be called with results as soon as Dr Pearlean Brownie has been able to review results.

## 2023-05-10 NOTE — Progress Notes (Signed)
Kindly inform the patient that transcranial Doppler study of the brain showed no significant blockages of the blood vessels in the brain and was normal

## 2023-05-10 NOTE — Progress Notes (Signed)
Kindly inform the patient that carotid ultrasound study showed no significant narrowing of either carotid arteries in the neck

## 2023-05-11 ENCOUNTER — Encounter: Payer: Self-pay | Admitting: Physical Therapy

## 2023-05-11 ENCOUNTER — Ambulatory Visit: Payer: Medicare Other | Admitting: Physical Therapy

## 2023-05-11 DIAGNOSIS — R262 Difficulty in walking, not elsewhere classified: Secondary | ICD-10-CM | POA: Diagnosis not present

## 2023-05-11 DIAGNOSIS — R42 Dizziness and giddiness: Secondary | ICD-10-CM | POA: Diagnosis not present

## 2023-05-11 DIAGNOSIS — R2681 Unsteadiness on feet: Secondary | ICD-10-CM | POA: Diagnosis not present

## 2023-05-11 DIAGNOSIS — M6281 Muscle weakness (generalized): Secondary | ICD-10-CM

## 2023-05-11 NOTE — Telephone Encounter (Signed)
Per Dr Pearlean Brownie "Kindly inform the patient that CT scan of the head and MRI scan of the brain both show age-related changes of mild shrinkage of the brain and hardening of the arteries.  No worrisome abnormality."  Called the patient and advised that the CT scan and MRI of the brain   Transcranial doppler- Normal and Carotid US Normal.  Reviewed all test results. Pt verbalized understanding. Pt had no questions at this time but was encouraged to call back if questions arise.

## 2023-05-11 NOTE — Therapy (Signed)
OUTPATIENT PHYSICAL THERAPY VESTIBULAR TREATMENT     Patient Name: Bryan Armstrong MRN: [id number] DOB:[birth year], 88 y.o., male Today's Date: 05/11/2023  END OF SESSION:  PT End of Session - 05/11/23 1447     Visit Number 5    Number of Visits 9    Date for PT Re-Evaluation 06/21/23    Authorization Type [employer] Medicare    PT Start Time 1445    PT Stop Time 1532    PT Time Calculation (min) 47 min    Equipment Utilized During Treatment Gait belt    Activity Tolerance Patient tolerated treatment well    Behavior During Therapy WFL for tasks assessed/performed             Past Medical History:  Diagnosis Date   BRADYCARDIA 06/01/2007   Cataract    bil cataracts removed   COPD (chronic obstructive pulmonary disease) (HCC)    EMPHYSEMA 03/28/2008   Emphysema of lung (HCC)    GERD (gastroesophageal reflux disease)    HYPERGLYCEMIA 06/01/2007   HYPERLIPIDEMIA 06/01/2007   Hypertension    HYPERTENSION 01/19/2009   Peripheral arterial disease (HCC)    TIA (transient ischemic attack)    Past Surgical History:  Procedure Laterality Date   ABDOMINAL AORTOGRAM W/LOWER EXTREMITY N/A 03/05/2020   Procedure: ABDOMINAL AORTOGRAM W/  LOWER EXTREMITY;  Surgeon: Runell Gess, MD;  Location: [facility];  Service: Cardiovascular;  Laterality: N/A;   CATARACT EXTRACTION, BILATERAL     COLONOSCOPY     ENDARTERECTOMY FEMORAL Right 04/03/2020   Procedure: Right Femoral Endarterectomy;  Surgeon: Sherren Kerns, MD;  Location: [facility] OR;  Service: Vascular;  Laterality: Right;   ENDARTERECTOMY FEMORAL Left 09/10/2020   Procedure: ENDARTERECTOMY FEMORAL LEFT;  Surgeon: Sherren Kerns, MD;  Location: [facility] OR;  Service: Vascular;  Laterality: Left;   EYE SURGERY     Bilaterl cataracts   PATCH ANGIOPLASTY Left 09/10/2020   Procedure: PATCH ANGIOPLASTY OF LEFT FEMORAL ARTERY USING HEMASHIELD PLATINUM FINESSE PATCH;  Surgeon: Sherren Kerns, MD;  Location: [facility] OR;  Service: Vascular;   Laterality: Left;   POLYPECTOMY     TONSILLECTOMY AND ADENOIDECTOMY     Patient Active Problem List   Diagnosis Date Noted   Vasovagal syncope 08/12/2022   Vertigo 06/09/2022   Osteoarthritis 02/24/2022   Vitamin B 12 deficiency 10/03/2021   Orthostatic hypotension 04/16/2021   Neuropathy 02/19/2021   Abnormal CXR 02/13/2021   BPV (benign positional vertigo) 02/13/2021   Hypotension 06/19/2020   TIA (transient ischemic attack) 04/18/2020   PAD (peripheral artery disease) (HCC) 04/03/2020   Neck pain 12/30/2018   GERD (gastroesophageal reflux disease) 09/28/2018   Peripheral arterial disease (HCC) 06/15/2018   Former smoker 06/15/2018   History of colonic polyps 01/07/2018   Allergic rhinitis 05/31/2013   Urticaria 11/02/2011   COPD, group A, by GOLD 2017 classification (HCC) 03/28/2008   Dyslipidemia 06/01/2007   Bradycardia 06/01/2007   HYPERGLYCEMIA 06/01/2007    PCP: Ardith Dark, MD   REFERRING PROVIDER: Micki Riley, MD  REFERRING DIAG: R42 (ICD-10-CM) - Dizziness  THERAPY DIAG:  Unsteadiness on feet  Dizziness and giddiness  Muscle weakness (generalized)  Difficulty in walking, not elsewhere classified  ONSET DATE: 04/08/2023  Rationale for Evaluation and Treatment: Rehabilitation  SUBJECTIVE:   SUBJECTIVE STATEMENT: Patient reporting he does not think he will use AD but open to trying one. Denies falls /near falls.   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 Pearlean Brownie: He continues to have intermittent transient episodes of dizziness, lightheadedness with right eye altitudinal vision loss in the lower occurring once a month or so.  These episodes last 5 to 7 minutes.  There is no clear triggers but he feels his blood pressure is low.   PAIN:  Are you  having pain? Yes: NPRS scale: 2-3/10 Pain location: R knee Pain description: achy Aggravating factors: exercise Relieving factors: brace, rest  There were no vitals filed for this visit.   PRECAUTIONS: Fall and Other: Orthostatics  WEIGHT BEARING RESTRICTIONS: No  FALLS: Has patient fallen in last 6 months? No and has had some almost falls where he feels faint  LIVING ENVIRONMENT: Lives with: lives alone Lives in: House/apartment- [employer] Stairs: 2 steps to get, railing  Has following equipment at home: None  PLOF: Independent and Leisure: Golf, last time playing was last fall  PATIENT GOALS: Wants to improve coordination, wants to be better off if he has a dizzy spell.   OBJECTIVE:   COGNITION: Overall cognitive status: Within functional limits for tasks assessed   SENSATION: Decreased sensation/numbness in BLE due to neuropathy    POSTURE:  Pt with forward flexed posture in standing    STRENGTH: Weakness noted with functional mobility in BLE; no formal MMT completed in today's visit    TRANSFERS: Assistive device utilized: None  Sit to stand: SBA Stand to sit: SBA  GAIT: Gait pattern:  Unsteadiness with gait and some veering, decreased stride length, trunk flexed, and wide BOS Distance walked: Clinic distances Assistive device utilized: None Level of assistance: SBA and CGA  PATIENT SURVEYS:  FOTO Staff did not capture   VESTIBULAR TREATMENT:                                                                                                   DATE: 05/11/23  There were no vitals filed for this visit.  Gait Training with AD Trials:  GAIT: Gait pattern: decreased stance time- Right, decreased hip/knee flexion- Right, decreased hip/knee flexion- Left, decreased ankle dorsiflexion- Right, decreased ankle dorsiflexion- Left, antalgic, and poor foot clearance- Left Distance walked: 1 x 80 feet Assistive device utilized: None Level of assistance:  SBA Comments: Poor LLE foot clearance noted; antalgic pattern increase tripping risk on LLE due to how quickly patient tries to get off RLE into stance on LLE   GAIT: Gait pattern: decreased stance time- Right, decreased hip/knee flexion- Right, decreased hip/knee flexion- Left, decreased ankle dorsiflexion- Right, decreased ankle dorsiflexion- Left, antalgic, and poor foot clearance- Left, forward flexed posture Distance walked: 1 x 230 feet Assistive device utilized: Rollator Level of assistance: SBA Comments: Patient demonstrates tendency to rush gait with rollator, improves stability laterally but increases forward propulsion/flexed posture making it not ideal AD without further education on pacing, patient reports not wanting to get one at this time after trying  GAIT: Gait pattern: decreased stance time- Right, decreased hip/knee flexion-  Right, decreased hip/knee flexion- Left, decreased ankle dorsiflexion- Right, decreased ankle dorsiflexion- Left, antalgic Distance walked: 1 x 230 feet Assistive device utilized: Foot Up brace Level of assistance: SBA Comments: Mildly improves patient's foot clearance on LLE, given RLE knee pain gait still limited  GAIT: Gait pattern: decreased stance time- Right, decreased hip/knee flexion- Right, decreased hip/knee flexion- Left, decreased ankle dorsiflexion- Right, decreased ankle dorsiflexion- Left, antalgic Distance walked: 1 x 115 feet Assistive device utilized: L townsend brace Level of assistance: SBA Comments: No major gait improvement to the point functional for patient; would not recommend at this time; trialed golf swing with it to improve medial lateral control   PATIENT EDUCATION: Education details: See above about devices, possible foot up brace in future + Continue HEP, discussed benefit of AD to prevent falls but patient not wanting to use at this time Person educated: Patient Education method: Explanation, Demonstration, and  Verbal cues Education comprehension: verbalized understanding, returned demonstration, and verbal cues required  HOME EXERCISE PROGRAM: From previous bout of therapy: Standing VOR x1 x30seconds with feet apart,   Access Code: [email or web address] URL: [email or web address] Date: 04/29/2023 Prepared by: Sherlie Ban  Exercises - Romberg Stance with Head Nods on Foam Pad  - 1 x daily - 5 x weekly - 2 sets - 10 reps - Romberg Stance on Foam Pad with Head Rotation  - 1 x daily - 5 x weekly - 2 sets - 10 reps - Walking with Head Rotation  - 1 x daily - 5 x weekly - 1 sets - 3 reps - Standing Balance with Eyes Closed on Foam  - 1 x daily - 5 x weekly - 3 sets - 30 hold  GOALS: Goals reviewed with patient? Yes  SHORT TERM GOALS: Target date: ALL STGS = LTGS    LONG TERM GOALS: Target date: 05/20/2023  Pt will be independent with HEP for improved balance and vestibular input  Baseline: pt had not been performing previous HEP - will review and update as appropriate.  Goal status: INITIAL  2.  Pt will improve FGA to at least a 19/30 in order to demo decr fall risk.  Baseline: 15/30 Goal status: INITIAL  3.  Pt will improve condition 2 of mCTSIB to at least 30 seconds  Baseline: 20 seconds Goal status: INITIAL  4.  MSQ to be assessed with goal written.  Baseline: goal not needed Goal status: INITIAL  5.  Pt will improve condition 4 of mCTSIB to at least 15 seconds in order for improved vestibular input for balance.  Baseline: 4-5 seconds  Goal status: INITIAL    ASSESSMENT:  CLINICAL IMPRESSION:  Session emphasized trial of multiple AD including rollator and LLE foot up brace and L lateral strut posterior guard townsend AFO. Patient had a difficult time pacing with rollator but could be more appropriate with additional education but patient not wanting to use at this time. Patient demonstrates slight improvement in foot up brace on LLE and not improvement with townsend  brace. Would recommend possible consideration of foot up brace at this time. Will continue to progress towards LTGs.   OBJECTIVE IMPAIRMENTS: Abnormal gait, decreased activity tolerance, decreased balance, decreased knowledge of use of DME, difficulty walking, decreased strength, dizziness, impaired sensation, and postural dysfunction.   ACTIVITY LIMITATIONS: bending, squatting, stairs, and locomotion level  PARTICIPATION LIMITATIONS: community activity, yard work, and golf  PERSONAL FACTORS: Age, Behavior pattern, Past/current experiences, Time since onset of injury/illness/exacerbation, and 3+ comorbidities: posterior circulation TIA in  July 2021 from small vessel disease, Neuropathy, hx of BPPV, Bradycardia, COPD, Emphysema, GERD, HLD, HTN, PAD, orthostatic hypotension, vasovagal syncope  are also affecting patient's functional outcome.   REHAB POTENTIAL: Fair due to co morbidities, orthostatics   CLINICAL DECISION MAKING: Evolving/moderate complexity  EVALUATION COMPLEXITY: Moderate   PLAN:  PT FREQUENCY: 2x/week  PT DURATION: 8 weeks - anticipate just 4 weeks, due to delay in scheduling   PLANNED INTERVENTIONS: Therapeutic exercises, Therapeutic activity, Neuromuscular re-education, Balance training, Gait training, Patient/Family education, Self Care, Vestibular training, Canalith repositioning, DME instructions, Manual therapy, and Re-evaluation  PLAN FOR NEXT SESSION: CHECK BP!! Foot up brace trial continue, activities for return to golf  Work on vestibular input for balance, head motions, balance with vision removed.    Sherlie Ban, PT, DPT 05/11/23 5:11 PM

## 2023-05-12 DIAGNOSIS — I4510 Unspecified right bundle-branch block: Secondary | ICD-10-CM | POA: Insufficient documentation

## 2023-05-13 ENCOUNTER — Ambulatory Visit: Payer: Medicare Other | Admitting: Physical Therapy

## 2023-05-13 ENCOUNTER — Encounter: Payer: Self-pay | Admitting: Physical Therapy

## 2023-05-13 VITALS — BP 109/57 | HR 68

## 2023-05-13 DIAGNOSIS — R2681 Unsteadiness on feet: Secondary | ICD-10-CM | POA: Diagnosis not present

## 2023-05-13 DIAGNOSIS — R42 Dizziness and giddiness: Secondary | ICD-10-CM

## 2023-05-13 DIAGNOSIS — R262 Difficulty in walking, not elsewhere classified: Secondary | ICD-10-CM | POA: Diagnosis not present

## 2023-05-13 DIAGNOSIS — M6281 Muscle weakness (generalized): Secondary | ICD-10-CM | POA: Diagnosis not present

## 2023-05-13 NOTE — Therapy (Unsigned)
OUTPATIENT PHYSICAL THERAPY VESTIBULAR TREATMENT     Patient Name: Bryan Armstrong MRN: [id number] DOB:June 13, 1935, 88 y.o., male Today's Date: 05/14/2023  END OF SESSION:  PT End of Session - 05/13/23 1534     Visit Number 6    Number of Visits 9    Date for PT Re-Evaluation 06/21/23    Authorization Type [employer] Medicare    PT Start Time 1532    PT Stop Time 1613    PT Time Calculation (min) 41 min    Equipment Utilized During Treatment Gait belt    Activity Tolerance Patient tolerated treatment well    Behavior During Therapy WFL for tasks assessed/performed             Past Medical History:  Diagnosis Date   BRADYCARDIA 06/01/2007   Cataract    bil cataracts removed   COPD (chronic obstructive pulmonary disease) (HCC)    EMPHYSEMA 03/28/2008   Emphysema of lung (HCC)    GERD (gastroesophageal reflux disease)    HYPERGLYCEMIA 06/01/2007   HYPERLIPIDEMIA 06/01/2007   Hypertension    HYPERTENSION 01/19/2009   Peripheral arterial disease (HCC)    TIA (transient ischemic attack)    Past Surgical History:  Procedure Laterality Date   ABDOMINAL AORTOGRAM W/LOWER EXTREMITY N/A 03/05/2020   Procedure: ABDOMINAL AORTOGRAM W/  LOWER EXTREMITY;  Surgeon: Runell Gess, MD;  Location: [facility];  Service: Cardiovascular;  Laterality: N/A;   CATARACT EXTRACTION, BILATERAL     COLONOSCOPY     ENDARTERECTOMY FEMORAL Right 04/03/2020   Procedure: Right Femoral Endarterectomy;  Surgeon: Sherren Kerns, MD;  Location: [facility] OR;  Service: Vascular;  Laterality: Right;   ENDARTERECTOMY FEMORAL Left 09/10/2020   Procedure: ENDARTERECTOMY FEMORAL LEFT;  Surgeon: Sherren Kerns, MD;  Location: [facility] OR;  Service: Vascular;  Laterality: Left;   EYE SURGERY     Bilaterl cataracts   PATCH ANGIOPLASTY Left 09/10/2020   Procedure: PATCH ANGIOPLASTY OF LEFT FEMORAL ARTERY USING HEMASHIELD PLATINUM FINESSE PATCH;  Surgeon: Sherren Kerns, MD;  Location: [facility] OR;  Service: Vascular;   Laterality: Left;   POLYPECTOMY     TONSILLECTOMY AND ADENOIDECTOMY     Patient Active Problem List   Diagnosis Date Noted   RBBB 05/12/2023   Vasovagal syncope 08/12/2022   Vertigo 06/09/2022   Osteoarthritis 02/24/2022   Vitamin B 12 deficiency 10/03/2021   Orthostatic hypotension 04/16/2021   Neuropathy 02/19/2021   Abnormal CXR 02/13/2021   BPV (benign positional vertigo) 02/13/2021   Hypotension 06/19/2020   TIA (transient ischemic attack) 04/18/2020   PAD (peripheral artery disease) (HCC) 04/03/2020   Neck pain 12/30/2018   GERD (gastroesophageal reflux disease) 09/28/2018   Peripheral arterial disease (HCC) 06/15/2018   Former smoker 06/15/2018   History of colonic polyps 01/07/2018   Allergic rhinitis 05/31/2013   Urticaria 11/02/2011   COPD, group A, by GOLD 2017 classification (HCC) 03/28/2008   Dyslipidemia 06/01/2007   Bradycardia 06/01/2007   HYPERGLYCEMIA 06/01/2007    PCP: Ardith Dark, MD   REFERRING PROVIDER: Micki Riley, MD  REFERRING DIAG: R42 (ICD-10-CM) - Dizziness  THERAPY DIAG:  Unsteadiness on feet  Dizziness and giddiness  ONSET DATE: 04/08/2023  Rationale for Evaluation and Treatment: Rehabilitation  SUBJECTIVE:   SUBJECTIVE STATEMENT: Felt a little more lightheaded earlier. Not wearing his compression shorts. Wears them when he thinks about them. Sees the cardiologist on Friday. Going to see [employer] tomorrow to see if he would benefit from an  insole in his shoe. Got results from Dr. Pearlean Brownie about normal carotid ultra sound doppler.   Pt accompanied by: self  PERTINENT HISTORY: PMH: posterior circulation TIA in July 2021 from small vessel disease, Neuropathy, hx of BPPV, Bradycardia, COPD, Emphysema, GERD, HLD, HTN, PAD, orthostatic hypotension, vasovagal syncope  Per Dr. Pearlean Brownie: He continues to have intermittent transient episodes of dizziness, lightheadedness with right eye altitudinal vision loss in the lower occurring once a  month or so.  These episodes last 5 to 7 minutes.  There is no clear triggers but he feels his blood pressure is low.   PAIN:  Are you having pain? Yes: NPRS scale: 2-3/10 Pain location: R knee Pain description: achy Aggravating factors: exercise Relieving factors: brace, rest  Vitals:   05/13/23 1544  BP: (!) 109/57  Pulse: 68     PRECAUTIONS: Fall and Other: Orthostatics  WEIGHT BEARING RESTRICTIONS: No  FALLS: Has patient fallen in last 6 months? No and has had some almost falls where he feels faint  LIVING ENVIRONMENT: Lives with: lives alone Lives in: House/apartment- [employer] Stairs: 2 steps to get, railing  Has following equipment at home: None  PLOF: Independent and Leisure: Golf, last time playing was last fall  PATIENT GOALS: Wants to improve coordination, wants to be better off if he has a dizzy spell.   OBJECTIVE:   COGNITION: Overall cognitive status: Within functional limits for tasks assessed   SENSATION: Decreased sensation/numbness in BLE due to neuropathy    POSTURE:  Pt with forward flexed posture in standing    STRENGTH: Weakness noted with functional mobility in BLE; no formal MMT completed in today's visit    TRANSFERS: Assistive device utilized: None  Sit to stand: SBA Stand to sit: SBA  GAIT: Gait pattern:  Unsteadiness with gait and some veering, decreased stride length, trunk flexed, and wide BOS Distance walked: Clinic distances Assistive device utilized: None Level of assistance: SBA and CGA  PATIENT SURVEYS:  FOTO Staff did not capture   VESTIBULAR TREATMENT:                                                                                                   DATE: 05/13/23  Therapeutic Activity:    Vitals:   05/13/23 1544  BP: (!) 109/57  Pulse: 68   Reports that his eyes have trouble working together, and feels loopy because of this. Sometimes will just have half of his vision with his R eye. Can last a few minutes.  This has been going on for 2-3 years and has had this feeling.   Convergence: approx. 5 cm from nose, difficulty to get correct answer when pt sees double, pt repots that pen was getting larger.   Discussed potential for referral for vision therapy/neuro optometrist at [facility] in [place] for a further evaluation for visual deficits. Pt interested in giving this a try, PT educating purpose of this referral with PT to send referral request for this.   NMR:  In corner on air ex: EO x10 reps each side trunk rotations with rach laterally, x10 reps  each side trunk rotations superior and laterally with reach, cues to look at hands with head/eyes Feet hip width distance EC x10 reps head turns, x10 reps head nods  Incr postural sway when performing this    PATIENT EDUCATION: Education details: Continue HEP for vestibular input, wearing compression shorts to help with BP, making sure pt talks to his cardiologist about how he should be taking his medications and taking it as prescribed. See therapeutic activity section above. Person educated: Patient Education method: Explanation, Demonstration, Verbal cues, and Handouts Education comprehension: verbalized understanding, returned demonstration, and verbal cues required  HOME EXERCISE PROGRAM: From previous bout of therapy: Standing VOR x1 x30seconds with feet apart,   Access Code: [email or web address] URL: [email or web address] Date: 04/29/2023 Prepared by: Sherlie Ban  Exercises - Romberg Stance with Head Nods on Foam Pad  - 1 x daily - 5 x weekly - 2 sets - 10 reps - Romberg Stance on Foam Pad with Head Rotation  - 1 x daily - 5 x weekly - 2 sets - 10 reps - Walking with Head Rotation  - 1 x daily - 5 x weekly - 1 sets - 3 reps - Standing Balance with Eyes Closed on Foam  - 1 x daily - 5 x weekly - 3 sets - 30 hold  GOALS: Goals reviewed with patient? Yes  SHORT TERM GOALS: Target date: ALL STGS = LTGS    LONG TERM  GOALS: Target date: 05/20/2023  Pt will be independent with HEP for improved balance and vestibular input  Baseline: pt had not been performing previous HEP - will review and update as appropriate.  Goal status: INITIAL  2.  Pt will improve FGA to at least a 19/30 in order to demo decr fall risk.  Baseline: 15/30 Goal status: INITIAL  3.  Pt will improve condition 2 of mCTSIB to at least 30 seconds  Baseline: 20 seconds Goal status: INITIAL  4.  MSQ to be assessed with goal written.  Baseline: goal not needed Goal status: INITIAL  5.  Pt will improve condition 4 of mCTSIB to at least 15 seconds in order for improved vestibular input for balance.  Baseline: 4-5 seconds  Goal status: INITIAL    ASSESSMENT:  CLINICAL IMPRESSION:  Session emphasized education today regarding cardiologist's instructions for wearing compression stockings and taking medication as prescribed.  Further education also provided regarding vision therapy due to pt reporting continued visual deficits and pt reporting that his eyes are not working together. Convergence was WNL. PT to send referral request for vision therapy, with pt in agreement with this plan. Will continue to progress towards LTGs.   OBJECTIVE IMPAIRMENTS: Abnormal gait, decreased activity tolerance, decreased balance, decreased knowledge of use of DME, difficulty walking, decreased strength, dizziness, impaired sensation, and postural dysfunction.   ACTIVITY LIMITATIONS: bending, squatting, stairs, and locomotion level  PARTICIPATION LIMITATIONS: community activity, yard work, and golf  PERSONAL FACTORS: Age, Behavior pattern, Past/current experiences, Time since onset of injury/illness/exacerbation, and 3+ comorbidities: posterior circulation TIA in July 2021 from small vessel disease, Neuropathy, hx of BPPV, Bradycardia, COPD, Emphysema, GERD, HLD, HTN, PAD, orthostatic hypotension, vasovagal syncope  are also affecting patient's functional  outcome.   REHAB POTENTIAL: Fair due to co morbidities, orthostatics   CLINICAL DECISION MAKING: Evolving/moderate complexity  EVALUATION COMPLEXITY: Moderate   PLAN:  PT FREQUENCY: 2x/week  PT DURATION: 8 weeks - anticipate just 4 weeks, due to delay in scheduling   PLANNED INTERVENTIONS: Therapeutic exercises, Therapeutic activity, Neuromuscular re-education,  Balance training, Gait training, Patient/Family education, Self Care, Vestibular training, Canalith repositioning, DME instructions, Manual therapy, and Re-evaluation  PLAN FOR NEXT SESSION: CHECK BP!! Foot up brace trial continue, activities for return to golf  Work on vestibular input for balance, head motions, balance with vision removed.    Sherlie Ban, PT, DPT 05/14/23 8:47 AM

## 2023-05-15 ENCOUNTER — Encounter: Payer: Self-pay | Admitting: Internal Medicine

## 2023-05-15 ENCOUNTER — Ambulatory Visit: Payer: Medicare Other | Attending: Internal Medicine | Admitting: Internal Medicine

## 2023-05-15 VITALS — BP 150/88 | HR 61 | Ht 71.0 in | Wt 201.0 lb

## 2023-05-15 DIAGNOSIS — I4510 Unspecified right bundle-branch block: Secondary | ICD-10-CM | POA: Diagnosis not present

## 2023-05-15 DIAGNOSIS — I951 Orthostatic hypotension: Secondary | ICD-10-CM

## 2023-05-15 DIAGNOSIS — R55 Syncope and collapse: Secondary | ICD-10-CM

## 2023-05-15 MED ORDER — MIDODRINE HCL 5 MG PO TABS: 7.5000 mg | ORAL_TABLET | ORAL | 3 refills | Status: DC

## 2023-05-15 NOTE — Patient Instructions (Signed)
Medication Instructions:   Your physician has recommended you make the following change in your medication:   ** Increase Midodrine 7.5mg  (1-1/2) tablets by mouth every 4 hours while awake.  *If you need a refill on your cardiac medications before your next appointment, please call your pharmacy*   Lab Work: None ordered.  If you have labs (blood work) drawn today and your tests are completely normal, you will receive your results only by: MyChart Message (if you have MyChart) OR A paper copy in the mail If you have any lab test that is abnormal or we need to change your treatment, we will call you to review the results.   Testing/Procedures: None ordered.    Follow-Up: At [facility], you and your health needs are our priority.  As part of our continuing mission to provide you with exceptional heart care, we have created designated Provider Care Teams.  These Care Teams include your primary Cardiologist (physician) and Advanced Practice Providers (APPs -  Physician Assistants and Nurse Practitioners) who all work together to provide you with the care you need, when you need it.  We recommend signing up for the patient portal called "MyChart".  Sign up information is provided on this After Visit Summary.  MyChart is used to connect with patients for Virtual Visits (Telemedicine).  Patients are able to view lab/test results, encounter notes, upcoming appointments, etc.  Non-urgent messages can be sent to your provider as well.   To learn more about what you can do with MyChart, go to [email or web address].    Your next appointment:   6 months with Dr Graciela Husbands

## 2023-05-15 NOTE — Progress Notes (Signed)
Patient Care Team: Ardith Dark, MD as PCP - General (Family Medicine) Tonny Bollman, MD as PCP - Cardiology (Cardiology) Duke Salvia, MD as PCP - Electrophysiology (Cardiology) Markus Jarvis, MD (Internal Medicine)   HPI  Bryan Armstrong is a 88 y.o. male Seen in follow-up for orthostatic hypotension in the setting of peripheral neuropathy and gait disturbance and remote right bundle branch block.  Currently treated with ProAmatine.  Hx of B12 deficiency with repletion   At the last visit, we increase his ProAmatine to 2.5--5 3 times daily.  Significant interval improvement.  However, some worsening symptoms this summer,  fludrocortisone added per Dr. [facility], at 0.1 mg daily.  Associated with fluid retention.  About 5 to 10 days after this was initiated he had an episode of presyncope at the church walking in.  Otherwise his lightheadedness has been much improved on the fludrocortisone The patient denies chest pain, shortness of breath, nocturnal dyspnea, orthopnea or peripheral edem.  There have been no palpitations.  Complains of lightheadedness and dizziness distinct from his prior history of vertigo which accompanies gait instability and effort intolerance.   DATE TEST EF    10/12 Echo  60-65 %    7/21 Echo  60-65 %               Date Cr K Hgb  11/21 1.41 4.0 13.8  6/22 1.15 4.3 13.8 (5/22)  7/23 1.07 (8/23) 4.0 14.8   5/24 1.16 4.0 13.9      Records and Results Reviewed   Past Medical History:  Diagnosis Date   BRADYCARDIA 06/01/2007   Cataract    bil cataracts removed   COPD (chronic obstructive pulmonary disease) (HCC)    EMPHYSEMA 03/28/2008   Emphysema of lung (HCC)    GERD (gastroesophageal reflux disease)    HYPERGLYCEMIA 06/01/2007   HYPERLIPIDEMIA 06/01/2007   Hypertension    HYPERTENSION 01/19/2009   Peripheral arterial disease (HCC)    TIA (transient ischemic attack)     Past Surgical History:  Procedure Laterality Date   ABDOMINAL  AORTOGRAM W/LOWER EXTREMITY N/A 03/05/2020   Procedure: ABDOMINAL AORTOGRAM W/  LOWER EXTREMITY;  Surgeon: Runell Gess, MD;  [facility];  Service: Cardiovascular;  Laterality: N/A;   CATARACT EXTRACTION, BILATERAL     COLONOSCOPY     ENDARTERECTOMY FEMORAL Right 04/03/2020   Procedure: Right Femoral Endarterectomy;  Surgeon: Sherren Kerns, MD;  Location: [facility] OR;  Service: Vascular;  Laterality: Right;   ENDARTERECTOMY FEMORAL Left 09/10/2020   Procedure: ENDARTERECTOMY FEMORAL LEFT;  Surgeon: Sherren Kerns, MD;  Location: [facility] OR;  Service: Vascular;  Laterality: Left;   EYE SURGERY     Bilaterl cataracts   PATCH ANGIOPLASTY Left 09/10/2020   Procedure: PATCH ANGIOPLASTY OF LEFT FEMORAL ARTERY USING HEMASHIELD PLATINUM FINESSE PATCH;  Surgeon: Sherren Kerns, MD;  Location: [facility] OR;  Service: Vascular;  Laterality: Left;   POLYPECTOMY     TONSILLECTOMY AND ADENOIDECTOMY      No outpatient medications have been marked as taking for the 05/15/23 encounter (Office Visit) with Duke Salvia, MD.    Allergies  Allergen Reactions   Bee Venom Anaphylaxis    Yellow jackets   Meclizine     Fatigue       Review of Systems negative except from HPI and PMH  Physical Exam BP (!) 150/88   Pulse 61   Ht 5\' 11"  (1.803 m)  Wt 201 lb (91.2 kg)   SpO2 95%   BMI 28.03 kg/m  Well developed and well nourished in no acute distress HENT normal Neck supple with JVP-flat Clear Regular rate and rhythm, no *** gallop No ***/*** murmur Abd-soft with active BS No Clubbing cyanosis *** edema Skin-warm and dry A & Oriented  Grossly normal sensory and motor function  ECG sinus at 61 Interval 21/14/43 Right bundle branch block     CrCl cannot be calculated (Patient's most recent lab result is older than the maximum 21 days allowed.).   Assessment and  Plan  Orthostatic hypotension severe   Diagnosis of vertigo, vasovagal syncope, ataxia   Peripheral vascular  disease with prior lower extremity revascularization   RBBB-old   Neuropathy   Worsening orthostatic hypotension of late.  He had discontinued his fludrocortisone because it was not "working "taking his ProAmatine a couple of times a day we have reviewed again the physiology of orthostatic hypotension as we noted on repeat test that his blood pressure went from 130 sitting to 80 standing.  He will resume his fludrocortisone at 0.1 mg daily, this is up from 3 times per week which has been the target dose because of volume retention.  We may have to back down.  Will also have him take his ProAmatine 3 times a day at 9, 3 and 7 PM.  Encouraged him to get compression shorts.  Stressed the importance of being attentive to this so as to avoid falls

## 2023-05-18 ENCOUNTER — Telehealth: Payer: Self-pay | Admitting: Physical Therapy

## 2023-05-18 NOTE — Telephone Encounter (Signed)
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 The patient could  benefit from a vision therapy evaluation sent to [facility] in [place] due to visual deficits to R eye that could be contributing to his balance. Pt also reports he feels as if his eyes have trouble working together.   If you agree, please send an order to [facility]  Phone #: [phone] Fax #: [phone] [address] [place], [place] [address]    Sherlie Ban, PT, DPT 05/18/23 1:19 PM       Ne[facility] Center  [address] [place], [place]  [address] Phone:  [phone] Fax:  [phone]

## 2023-05-20 ENCOUNTER — Telehealth: Payer: Self-pay | Admitting: Physical Therapy

## 2023-05-20 ENCOUNTER — Other Ambulatory Visit: Payer: Self-pay | Admitting: *Deleted

## 2023-05-20 ENCOUNTER — Encounter: Payer: Self-pay | Admitting: Physical Therapy

## 2023-05-20 ENCOUNTER — Ambulatory Visit: Payer: Medicare Other | Attending: Neurology | Admitting: Physical Therapy

## 2023-05-20 VITALS — BP 132/65 | HR 63

## 2023-05-20 DIAGNOSIS — R2689 Other abnormalities of gait and mobility: Secondary | ICD-10-CM

## 2023-05-20 DIAGNOSIS — R42 Dizziness and giddiness: Secondary | ICD-10-CM | POA: Insufficient documentation

## 2023-05-20 DIAGNOSIS — R2681 Unsteadiness on feet: Secondary | ICD-10-CM | POA: Diagnosis not present

## 2023-05-20 DIAGNOSIS — R262 Difficulty in walking, not elsewhere classified: Secondary | ICD-10-CM | POA: Diagnosis not present

## 2023-05-20 DIAGNOSIS — M6281 Muscle weakness (generalized): Secondary | ICD-10-CM | POA: Insufficient documentation

## 2023-05-20 NOTE — Therapy (Signed)
OUTPATIENT PHYSICAL THERAPY VESTIBULAR TREATMENT     Patient Name: Bryan Armstrong MRN: [id number] DOB:02-26-1935, 88 y.o., male Today's Date: 05/21/2023  END OF SESSION:  PT End of Session - 05/20/23 1450     Visit Number 7    Number of Visits 9    Date for PT Re-Evaluation 06/21/23    Authorization Type [employer] Medicare    PT Start Time 1449    PT Stop Time 1530    PT Time Calculation (min) 41 min    Equipment Utilized During Treatment Gait belt    Activity Tolerance Patient tolerated treatment well    Behavior During Therapy WFL for tasks assessed/performed             Past Medical History:  Diagnosis Date   BRADYCARDIA 06/01/2007   Cataract    bil cataracts removed   COPD (chronic obstructive pulmonary disease) (HCC)    EMPHYSEMA 03/28/2008   Emphysema of lung (HCC)    GERD (gastroesophageal reflux disease)    HYPERGLYCEMIA 06/01/2007   HYPERLIPIDEMIA 06/01/2007   Hypertension    HYPERTENSION 01/19/2009   Peripheral arterial disease (HCC)    TIA (transient ischemic attack)    Past Surgical History:  Procedure Laterality Date   ABDOMINAL AORTOGRAM W/LOWER EXTREMITY N/A 03/05/2020   Procedure: ABDOMINAL AORTOGRAM W/  LOWER EXTREMITY;  Surgeon: Runell Gess, MD;  [facility];  Service: Cardiovascular;  Laterality: N/A;   CATARACT EXTRACTION, BILATERAL     COLONOSCOPY     ENDARTERECTOMY FEMORAL Right 04/03/2020   Procedure: Right Femoral Endarterectomy;  Surgeon: Sherren Kerns, MD;  Location: [facility] OR;  Service: Vascular;  Laterality: Right;   ENDARTERECTOMY FEMORAL Left 09/10/2020   Procedure: ENDARTERECTOMY FEMORAL LEFT;  Surgeon: Sherren Kerns, MD;  Location: [facility] OR;  Service: Vascular;  Laterality: Left;   EYE SURGERY     Bilaterl cataracts   PATCH ANGIOPLASTY Left 09/10/2020   Procedure: PATCH ANGIOPLASTY OF LEFT FEMORAL ARTERY USING HEMASHIELD PLATINUM FINESSE PATCH;  Surgeon: Sherren Kerns, MD;  Location: [facility] OR;  Service: Vascular;   Laterality: Left;   POLYPECTOMY     TONSILLECTOMY AND ADENOIDECTOMY     Patient Active Problem List   Diagnosis Date Noted   RBBB 05/12/2023   Vasovagal syncope 08/12/2022   Vertigo 06/09/2022   Osteoarthritis 02/24/2022   Vitamin B 12 deficiency 10/03/2021   Orthostatic hypotension 04/16/2021   Neuropathy 02/19/2021   Abnormal CXR 02/13/2021   BPV (benign positional vertigo) 02/13/2021   Hypotension 06/19/2020   TIA (transient ischemic attack) 04/18/2020   PAD (peripheral artery disease) (HCC) 04/03/2020   Neck pain 12/30/2018   GERD (gastroesophageal reflux disease) 09/28/2018   Peripheral arterial disease (HCC) 06/15/2018   Former smoker 06/15/2018   History of colonic polyps 01/07/2018   Allergic rhinitis 05/31/2013   Urticaria 11/02/2011   COPD, group A, by GOLD 2017 classification (HCC) 03/28/2008   Dyslipidemia 06/01/2007   Bradycardia 06/01/2007   HYPERGLYCEMIA 06/01/2007    PCP: Ardith Dark, MD   REFERRING PROVIDER: Micki Riley, MD  REFERRING DIAG: R42 (ICD-10-CM) - Dizziness  THERAPY DIAG:  Unsteadiness on feet  Dizziness and giddiness  ONSET DATE: 04/08/2023  Rationale for Evaluation and Treatment: Rehabilitation  SUBJECTIVE:   SUBJECTIVE STATEMENT: Has an appt with neuro-optometry at [facility] in [place]. Goes the 5th of September. Saw the cardiologist last week. Reports he wants him to wear tighter compression shorts.   Pt accompanied by: self  PERTINENT  HISTORY: PMH: posterior circulation TIA in July 2021 from small vessel disease, Neuropathy, hx of BPPV, Bradycardia, COPD, Emphysema, GERD, HLD, HTN, PAD, orthostatic hypotension, vasovagal syncope  Per Dr. Pearlean Brownie: He continues to have intermittent transient episodes of dizziness, lightheadedness with right eye altitudinal vision loss in the lower occurring once a month or so.  These episodes last 5 to 7 minutes.  There is no clear triggers but he feels his blood pressure is low.    PAIN:  Are you having pain? Yes: NPRS scale: 2-3/10 Pain location: R knee Pain description: achy Aggravating factors: exercise Relieving factors: brace, rest  Vitals:   05/20/23 1510 05/20/23 1511  BP: (!) 151/82 132/65  Pulse: (!) 57 63      PRECAUTIONS: Fall and Other: Orthostatics  WEIGHT BEARING RESTRICTIONS: No  FALLS: Has patient fallen in last 6 months? No and has had some almost falls where he feels faint  LIVING ENVIRONMENT: Lives with: lives alone Lives in: House/apartment- [employer] Stairs: 2 steps to get, railing  Has following equipment at home: None  PLOF: Independent and Leisure: Golf, last time playing was last fall  PATIENT GOALS: Wants to improve coordination, wants to be better off if he has a dizzy spell.   OBJECTIVE:   COGNITION: Overall cognitive status: Within functional limits for tasks assessed   SENSATION: Decreased sensation/numbness in BLE due to neuropathy    POSTURE:  Pt with forward flexed posture in standing    STRENGTH: Weakness noted with functional mobility in BLE; no formal MMT completed in today's visit    TRANSFERS: Assistive device utilized: None  Sit to stand: SBA Stand to sit: SBA  GAIT: Gait pattern:  Unsteadiness with gait and some veering, decreased stride length, trunk flexed, and wide BOS Distance walked: Clinic distances Assistive device utilized: None Level of assistance: SBA and CGA  PATIENT SURVEYS:  FOTO Staff did not capture   VESTIBULAR TREATMENT:                                                                                                   DATE: 05/20/23   Vitals:   05/20/23 1510 05/20/23 1511  BP: (!) 151/82 132/65  Pulse: (!) 57 63    Therapeutic Activity:  Disuccsed vision therapy (pt has appointment coming up in beginning of September), pt to reach out to PT in future to give updates on their findings  Discussed cardiologist appt from 05/15/23 - reiterated importance of taking BP  medication as prescribed for orthostatic hypotension as this can significantly affect his balance and risk for falls. Also discussed use of compression shorts as recommended by cardiologist (pt reports that his current ones are not tight enough). Also mentioned potential use of an abdominal binder and educated on purpose of these for orthostatic hypotension. Encouraged/educated pt to reach out to cardiologist to see if this would be a good option for pt  Assessed orthostatic BP values during session (see above)  Discussed D/C at next session, potential for maybe ortho PT for R knee pain to see if that will help pain in knee as  pt does not want to get a total knee replacement until his balance is better. Pt will think about this  Importance of continuing HEP for vestibular deficits and balance and even after therapy to continue performing a few times a week   NMR:  Rockerboard in A/P direction:  Newman Pies toss with 2nd PT in multi-directions for hip/ankle strategy and reactive balance, also for posture with pt catching ball over head, performed approx. 30 reps. Pt unsteady and needing to grab onto the bars are times for steadying  Trrunk rotation, cued for bending knees and rotating with ball and looking at it with head/eyes for visual tracking, performed x10 reps, pt reporting incr R knee pain and needing to step off.  Rockerboard M/L direction (trying this direction per pt request) Weight shifting x15 reps with cues for proper weight shifting and bending knees, then trialing golf swing motion with trunk rotation, pt reporting incr knee pain with this task after approx. 10 reps  On blue mat: golf swing x20 reps with RLE as trialing limb, pt reporting no knee pain when doing this and pt able to maintain his balance. Pt enjoying this exercise and kept wanting to perform   PATIENT EDUCATION: Education details: See therapeutic activity section above. Plan for D/C at next session  Person educated:  Patient Education method: Explanation, Demonstration, Verbal cues, and Handouts Education comprehension: verbalized understanding, returned demonstration, and verbal cues required  HOME EXERCISE PROGRAM: From previous bout of therapy: Standing VOR x1 x30seconds with feet apart,   Access Code: [email or web address] URL: [email or web address] Date: 04/29/2023 Prepared by: Sherlie Ban  Exercises - Romberg Stance with Head Nods on Foam Pad  - 1 x daily - 5 x weekly - 2 sets - 10 reps - Romberg Stance on Foam Pad with Head Rotation  - 1 x daily - 5 x weekly - 2 sets - 10 reps - Walking with Head Rotation  - 1 x daily - 5 x weekly - 1 sets - 3 reps - Standing Balance with Eyes Closed on Foam  - 1 x daily - 5 x weekly - 3 sets - 30 hold  GOALS: Goals reviewed with patient? Yes  SHORT TERM GOALS: Target date: ALL STGS = LTGS    LONG TERM GOALS: Target date: 05/20/2023  Pt will be independent with HEP for improved balance and vestibular input  Baseline: pt had not been performing previous HEP - will review and update as appropriate.  Goal status: INITIAL  2.  Pt will improve FGA to at least a 19/30 in order to demo decr fall risk.  Baseline: 15/30 Goal status: INITIAL  3.  Pt will improve condition 2 of mCTSIB to at least 30 seconds  Baseline: 20 seconds Goal status: INITIAL  4.  MSQ to be assessed with goal written.  Baseline: goal not needed Goal status: INITIAL  5.  Pt will improve condition 4 of mCTSIB to at least 15 seconds in order for improved vestibular input for balance.  Baseline: 4-5 seconds  Goal status: INITIAL    ASSESSMENT:  CLINICAL IMPRESSION: Today's session focused heavily on education (see therapeutic activity section above for more details), esp on orthostatic BP management and reviewed instructions from cardiologist. Encouraged pt to also reach out to cardiologist for use of an abdominal binder to see if that would be a good option for pt.  Remainder of session focused on balance on unlevel surfaces. Session limited due to continued R knee pain. Pt in agreement to D/C  at next session due to pt reaching max benefit from vestibular PT at this time as pt's balance confounded by many other factors/co-morbidities. Pt also scheduled for vision therapy in beginning of September. Will continue to progress towards LTGs.   OBJECTIVE IMPAIRMENTS: Abnormal gait, decreased activity tolerance, decreased balance, decreased knowledge of use of DME, difficulty walking, decreased strength, dizziness, impaired sensation, and postural dysfunction.   ACTIVITY LIMITATIONS: bending, squatting, stairs, and locomotion level  PARTICIPATION LIMITATIONS: community activity, yard work, and golf  PERSONAL FACTORS: Age, Behavior pattern, Past/current experiences, Time since onset of injury/illness/exacerbation, and 3+ comorbidities: posterior circulation TIA in July 2021 from small vessel disease, Neuropathy, hx of BPPV, Bradycardia, COPD, Emphysema, GERD, HLD, HTN, PAD, orthostatic hypotension, vasovagal syncope  are also affecting patient's functional outcome.   REHAB POTENTIAL: Fair due to co morbidities, orthostatics   CLINICAL DECISION MAKING: Evolving/moderate complexity  EVALUATION COMPLEXITY: Moderate   PLAN:  PT FREQUENCY: 2x/week  PT DURATION: 8 weeks - anticipate just 4 weeks, due to delay in scheduling   PLANNED INTERVENTIONS: Therapeutic exercises, Therapeutic activity, Neuromuscular re-education, Balance training, Gait training, Patient/Family education, Self Care, Vestibular training, Canalith repositioning, DME instructions, Manual therapy, and Re-evaluation  PLAN FOR NEXT SESSION: check goals, plan to D/C at next session, review HEP    Sherlie Ban, PT, DPT 05/21/23 9:47 AM

## 2023-05-20 NOTE — Telephone Encounter (Signed)
Ok with me. Please place any necessary orders. 

## 2023-05-20 NOTE — Telephone Encounter (Signed)
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 The patient could  benefit from a vision therapy evaluation sent to [facility] in [place] due to visual deficits to R eye that could be contributing to his balance. Pt also reports he feels as if his eyes have trouble working together.   If you agree, please send an order to [facility]  Phone #: [phone] Fax #: [phone] [address] [place], [place] [address]   Sherlie Ban, PT, DPT 05/20/23 9:42 AM     Neurorehabilitation Center   [address] [place], [place]  [address] Phone:  [phone] Fax:  [phone]

## 2023-05-20 NOTE — Telephone Encounter (Signed)
Referral to [employer] placed

## 2023-05-22 ENCOUNTER — Encounter: Payer: Self-pay | Admitting: Physical Therapy

## 2023-05-22 ENCOUNTER — Ambulatory Visit: Payer: Medicare Other | Admitting: Physical Therapy

## 2023-05-22 VITALS — BP 96/56 | HR 65

## 2023-05-22 DIAGNOSIS — R42 Dizziness and giddiness: Secondary | ICD-10-CM

## 2023-05-22 DIAGNOSIS — M6281 Muscle weakness (generalized): Secondary | ICD-10-CM

## 2023-05-22 DIAGNOSIS — R262 Difficulty in walking, not elsewhere classified: Secondary | ICD-10-CM

## 2023-05-22 DIAGNOSIS — R2681 Unsteadiness on feet: Secondary | ICD-10-CM

## 2023-05-22 NOTE — Therapy (Signed)
[facility] [address] [place], [place], [address] Phone: [phone]   Fax:  [phone]  Patient Details  Name: Bryan Armstrong MRN: [id number] Date of Birth: [birth year] Referring Provider:  Micki Riley, MD  Encounter Date: 05/22/2023  Session is arrive no charge. Patient arrives to session stating he is lightheaded. Patient reports that he has not taken his BP booster earlier today. BP dropping > 20 mmHg for systolic in standing and as today intended to be goals check and discharge unsafe to continue session. Therapist urged patient to allow her to call someone to drive patient home but patient refused. Therapist also encouraged patient to call PCP and follow up. Patient did not agree to doing so. Patient states they are fine if this is there last PT session and do not feel the need to make it up.  Vitals:   05/22/23 1454 05/22/23 1455  BP: 119/68 (!) 96/56  Pulse: 61 65  's  PHYSICAL THERAPY DISCHARGE SUMMARY  Visits from Start of Care: 1  Current functional level related to goals / functional outcomes: Unable to assess   Remaining deficits: Unable to assess   Education / Equipment: Follow up with PCP about BP, go to ED if becomes more symptomatic   Patient agrees to discharge. Patient goals were  unable to be assessed . Patient is being discharged due to the patient's request.   Carmelia Bake, PT, DPT 05/22/2023, 3:11 PM  [facility] [address] [place], [place], [address] Phone: [phone]   Fax:  [phone]

## 2023-06-18 DIAGNOSIS — H5581 Saccadic eye movements: Secondary | ICD-10-CM | POA: Diagnosis not present

## 2023-06-18 DIAGNOSIS — H5332 Fusion with defective stereopsis: Secondary | ICD-10-CM | POA: Diagnosis not present

## 2023-06-20 ENCOUNTER — Other Ambulatory Visit: Payer: Self-pay | Admitting: Family Medicine

## 2023-07-02 ENCOUNTER — Inpatient Hospital Stay: Payer: Medicare Other | Admitting: Oncology

## 2023-07-02 ENCOUNTER — Inpatient Hospital Stay: Payer: Medicare Other

## 2023-07-02 DIAGNOSIS — M1711 Unilateral primary osteoarthritis, right knee: Secondary | ICD-10-CM | POA: Diagnosis not present

## 2023-07-06 ENCOUNTER — Ambulatory Visit: Payer: Medicare Other | Admitting: Family Medicine

## 2023-07-07 ENCOUNTER — Telehealth: Payer: Self-pay | Admitting: Family Medicine

## 2023-07-07 NOTE — Telephone Encounter (Signed)
Type of form received:  Postal Service  Additional comments: None  Received by: Harriett Sine  Form should be Faxed to: NA  Form should be mailed to:  NA  Is patient requesting call for pickup: YES   Form placed:  Pharmacist, hospital charge sheet. Yes  Individual made aware of 3-5 business day turn around (Y/N)? Yes

## 2023-07-09 NOTE — Telephone Encounter (Signed)
Placed in PCP office to be reviewed

## 2023-07-09 NOTE — Telephone Encounter (Signed)
Patient requests to be called re: status of Postal Service form

## 2023-07-10 NOTE — Telephone Encounter (Signed)
Please call him back asap

## 2023-07-13 NOTE — Telephone Encounter (Signed)
Patient called again. Requests to be called asap.

## 2023-07-14 ENCOUNTER — Other Ambulatory Visit: Payer: Self-pay | Admitting: Family Medicine

## 2023-07-14 NOTE — Telephone Encounter (Signed)
Patient notified form and letter placed at front office to be pick up Copy placed to be scan in patient chart

## 2023-07-15 ENCOUNTER — Other Ambulatory Visit: Payer: Self-pay | Admitting: Family Medicine

## 2023-07-15 ENCOUNTER — Ambulatory Visit: Payer: Medicare Other | Attending: Cardiovascular Disease | Admitting: Cardiovascular Disease

## 2023-07-15 ENCOUNTER — Encounter: Payer: Self-pay | Admitting: Cardiovascular Disease

## 2023-07-15 VITALS — BP 130/82 | HR 64 | Ht 70.5 in | Wt 195.0 lb

## 2023-07-15 DIAGNOSIS — I951 Orthostatic hypotension: Secondary | ICD-10-CM

## 2023-07-15 DIAGNOSIS — I739 Peripheral vascular disease, unspecified: Secondary | ICD-10-CM | POA: Diagnosis not present

## 2023-07-15 DIAGNOSIS — I10 Essential (primary) hypertension: Secondary | ICD-10-CM | POA: Diagnosis not present

## 2023-07-15 DIAGNOSIS — E782 Mixed hyperlipidemia: Secondary | ICD-10-CM | POA: Diagnosis not present

## 2023-07-15 NOTE — Patient Instructions (Signed)
Medication Instructions:  Your physician recommends that you continue on your current medications as directed. Please refer to the Current Medication list given to you today.  *If you need a refill on your cardiac medications before your next appointment, please call your pharmacy*  Follow-Up: At [facility], you and your health needs are our priority.  As part of our continuing mission to provide you with exceptional heart care, we have created designated Provider Care Teams.  These Care Teams include your primary Cardiologist (physician) and Advanced Practice Providers (APPs -  Physician Assistants and Nurse Practitioners) who all work together to provide you with the care you need, when you need it.  Your next appointment:   1 year  Provider:   Tonny Bollman, MD

## 2023-07-15 NOTE — Progress Notes (Signed)
Cardiology Office Note:    Date:  07/15/2023   ID:  Bryan Armstrong, DOB 1935/03/24, MRN [id number]  PCP:  Ardith Dark, MD   [facility] Providers Cardiologist:  Tonny Bollman, MD Electrophysiologist:  Sherryl Manges, MD     Referring MD: Ardith Dark, MD   Chief Complaint  Patient presents with   Follow-up    Orthostatic hypotension    History of Present Illness:    Bryan Armstrong is a 88 y.o. male presenting for follow-up evaluation.  He has been followed for progressive orthostatic intolerance.  He most recently was seen by Dr. Graciela Husbands in early August at which time his midodrine was increased to 7.5 mg every 4 hours while awake.  He was also placed back on fludrocortisone which had been discontinued because of fluid retention.  At the time of that office visit his systolic blood pressure was noted to drop from 130 mmHg down to 80 mmHg when going from supine to standing positions.  Other cardiac problems include peripheral arterial disease status post femoral endarterectomy by Dr. Darrick Penna in the past.  The patient is here alone today.  He has been doing pretty well.  He has had no further problems with dizziness or lightheadedness.  He currently is taking fludrocortisone 0.1 mg daily and midodrine every morning and then as needed throughout the rest of the day.  States that he has not had any problems with postural lightheadedness or syncope over the last 6 months.  No chest pain or shortness of breath.  No other complaints at this time.  Past Medical History:  Diagnosis Date   BRADYCARDIA 06/01/2007   Cataract    bil cataracts removed   COPD (chronic obstructive pulmonary disease) (HCC)    EMPHYSEMA 03/28/2008   Emphysema of lung (HCC)    GERD (gastroesophageal reflux disease)    HYPERGLYCEMIA 06/01/2007   HYPERLIPIDEMIA 06/01/2007   Hypertension    HYPERTENSION 01/19/2009   Peripheral arterial disease (HCC)    TIA (transient ischemic attack)     Past Surgical  History:  Procedure Laterality Date   ABDOMINAL AORTOGRAM W/LOWER EXTREMITY N/A 03/05/2020   Procedure: ABDOMINAL AORTOGRAM W/  LOWER EXTREMITY;  Surgeon: Runell Gess, MD;  Location: [facility];  Service: Cardiovascular;  Laterality: N/A;   CATARACT EXTRACTION, BILATERAL     COLONOSCOPY     ENDARTERECTOMY FEMORAL Right 04/03/2020   Procedure: Right Femoral Endarterectomy;  Surgeon: Sherren Kerns, MD;  Location: [facility] OR;  Service: Vascular;  Laterality: Right;   ENDARTERECTOMY FEMORAL Left 09/10/2020   Procedure: ENDARTERECTOMY FEMORAL LEFT;  Surgeon: Sherren Kerns, MD;  Location: [facility] OR;  Service: Vascular;  Laterality: Left;   EYE SURGERY     Bilaterl cataracts   PATCH ANGIOPLASTY Left 09/10/2020   Procedure: PATCH ANGIOPLASTY OF LEFT FEMORAL ARTERY USING HEMASHIELD PLATINUM FINESSE PATCH;  Surgeon: Sherren Kerns, MD;  Location: [facility] OR;  Service: Vascular;  Laterality: Left;   POLYPECTOMY     TONSILLECTOMY AND ADENOIDECTOMY      Current Medications: Current Meds  Medication Sig   ADVAIR DISKUS 250-50 MCG/ACT AEPB INHALE 1 PUFF INTO THE LUNGS EVERY 12 HOURS   aspirin 81 MG tablet Take 81 mg by mouth daily.    cyanocobalamin 2000 MCG tablet Take 1,000 mcg by mouth daily.   EPINEPHrine (EPIPEN 2-PAK) 0.3 mg/0.3 mL IJ SOAJ injection Inject 0.3 mLs (0.3 mg total) into the muscle as needed for anaphylaxis.   fludrocortisone (  FLORINEF) 0.1 MG tablet Take 1 tablet (0.1 mg total) by mouth daily.   fluticasone (FLONASE) 50 MCG/ACT nasal spray Place 2 sprays into both nostrils daily as needed for allergies or rhinitis.   midodrine (PROAMATINE) 5 MG tablet Take 1.5 tablets (7.5 mg total) by mouth every 4 (four) hours while awake.   rosuvastatin (CRESTOR) 20 MG tablet TAKE 1 TABLET(20 MG) BY MOUTH DAILY AT 6 PM     Allergies:   Bee venom and Meclizine   Social History   Socioeconomic History   Marital status: Widowed    Spouse name: Not on file   Number of children: Not  on file   Years of education: Not on file   Highest education level: Not on file  Occupational History   Occupation: Retired   Tobacco Use   Smoking status: Former    Current packs/day: 0.00    Average packs/day: 0.5 packs/day for 40.0 years (20.0 ttl pk-yrs)    Types: Cigarettes    Start date: 09/26/1977    Quit date: 09/26/2017    Years since quitting: 5.8   Smokeless tobacco: Never   Tobacco comments:    stopped 2018  Vaping Use   Vaping status: Never Used  Substance and Sexual Activity   Alcohol use: No   Drug use: No   Sexual activity: Not on file  Other Topics Concern   Not on file  Social History Narrative   Lives at home with wife   Right handed   Caffeine: drinks de-caf drinks, diet drinks if its a coke.   Social Determinants of Health   Financial Resource Strain: Low Risk  (04/20/2023)   Overall Financial Resource Strain (CARDIA)    Difficulty of Paying Living Expenses: Not hard at all  Food Insecurity: No Food Insecurity (04/20/2023)   Hunger Vital Sign    Worried About Running Out of Food in the Last Year: Never true    Ran Out of Food in the Last Year: Never true  Transportation Needs: No Transportation Needs (04/20/2023)   PRAPARE - Administrator, Civil Service (Medical): No    Lack of Transportation (Non-Medical): No  Physical Activity: Insufficiently Active (04/20/2023)   Exercise Vital Sign    Days of Exercise per Week: 3 days    Minutes of Exercise per Session: 30 min  Stress: No Stress Concern Present (04/20/2023)   [employer] of Occupational Health - Occupational Stress Questionnaire    Feeling of Stress : Not at all  Social Connections: Moderately Isolated (04/20/2023)   Social Connection and Isolation Panel [NHANES]    Frequency of Communication with Friends and Family: More than three times a week    Frequency of Social Gatherings with Friends and Family: More than three times a week    Attends Religious Services: More than 4 times  per year    Active Member of [employer] or Organizations: No    Attends Banker Meetings: Never    Marital Status: Widowed     Family History: The patient's family history includes Cancer in his mother; Heart disease in his father; Uterine cancer in his mother. There is no history of Colon cancer, Esophageal cancer, Rectal cancer, Stomach cancer, Liver cancer, Pancreatic cancer, Prostate cancer, or Stroke.  ROS:   Please see the history of present illness.    All other systems reviewed and are negative.  EKGs/Labs/Other Studies Reviewed:    The following studies were reviewed today: Echo 04/18/2020:  1. Left  ventricular ejection fraction, by estimation, is 60 to 65%. The  left ventricle has normal function. The left ventricle has no regional  wall motion abnormalities. Left ventricular diastolic parameters were  normal.   2. Right ventricular systolic function is normal. The right ventricular  size is normal.   3. Left atrial size was mildly dilated.   4. The mitral valve is normal in structure. Trivial mitral valve  regurgitation. No evidence of mitral stenosis.   5. The aortic valve is normal in structure. Aortic valve regurgitation is  not visualized. Mild aortic valve sclerosis is present, with no evidence  of aortic valve stenosis.   6. The inferior vena cava is normal in size with greater than 50%  respiratory variability, suggesting right atrial pressure of 3 mmHg.      Myocardial Perfusion Study 03/21/2020: Nuclear stress EF: 59%. There was no ST segment deviation noted during stress. The study is normal. This is a low risk study. The left ventricular ejection fraction is normal (55-65%).  Recent Labs: 02/27/2023: ALT 15; BUN 21; Creatinine, Ser 1.16; Hemoglobin 13.9; Platelets 200.0; Potassium 4.0; Sodium 139; TSH 2.52  Recent Lipid Panel    Component Value Date/Time   CHOL 110 02/27/2023 1431   TRIG 116.0 02/27/2023 1431   HDL 36.00 (L) 02/27/2023 1431    CHOLHDL 3 02/27/2023 1431   VLDL 23.2 02/27/2023 1431   LDLCALC 51 02/27/2023 1431     Risk Assessment/Calculations:                Physical Exam:    VS:  BP 130/82   Pulse 64   Ht 5' 10.5" (1.791 m)   Wt 195 lb (88.5 kg)   SpO2 97%   BMI 27.58 kg/m     Wt Readings from Last 3 Encounters:  07/15/23 195 lb (88.5 kg)  05/15/23 201 lb (91.2 kg)  04/20/23 200 lb (90.7 kg)     GEN:  Well nourished, well developed in no acute distress HEENT: Normal NECK: No JVD; No carotid bruits LYMPHATICS: No lymphadenopathy CARDIAC: RRR, no murmurs, rubs, gallops.  Right brachial and radial pulses are diminished compared to the left (1+ versus 2+) RESPIRATORY:  Clear to auscultation without rales, wheezing or rhonchi  ABDOMEN: Soft, non-tender, non-distended MUSCULOSKELETAL:  No edema; No deformity  SKIN: Warm and dry NEUROLOGIC:  Alert and oriented x 3 PSYCHIATRIC:  Normal affect   ASSESSMENT:    1. Peripheral arterial disease (HCC)   2. Orthostatic hypotension   3. Essential hypertension   4. Mixed hyperlipidemia    PLAN:    In order of problems listed above:  Patient status post bilateral femoral endarterectomy, not currently experiencing any claudication symptoms.  I repeated his blood pressures today and got a reading of 148/84 on the left and 100/80 on the right.  He does have a pulse differential as well.  He is completely asymptomatic.  I suspect he has some right upper extremity arterial occlusive disease.  He did have a carotid duplex scan recently in the neurology office which showed no significant abnormalities.  Continue medical management with aspirin and high intensity statin drug.  Followed by vascular surgery as well. Symptoms have stabilized on a combination of fludrocortisone and midodrine.  Continue current management.  Patient appears to be doing very well in this regard. As above, continue current medications.  Patient is off of all antihypertensive medications  due to orthostatic hypotension. Treated with rosuvastatin.  LDL cholesterol 51 mg/dL.  ALT is  15.           Medication Adjustments/Labs and Tests Ordered: Current medicines are reviewed at length with the patient today.  Concerns regarding medicines are outlined above.  No orders of the defined types were placed in this encounter.  No orders of the defined types were placed in this encounter.   Patient Instructions  Medication Instructions:  Your physician recommends that you continue on your current medications as directed. Please refer to the Current Medication list given to you today.  *If you need a refill on your cardiac medications before your next appointment, please call your pharmacy*  Follow-Up: At [facility], you and your health needs are our priority.  As part of our continuing mission to provide you with exceptional heart care, we have created designated Provider Care Teams.  These Care Teams include your primary Cardiologist (physician) and Advanced Practice Providers (APPs -  Physician Assistants and Nurse Practitioners) who all work together to provide you with the care you need, when you need it.  Your next appointment:   1 year  Provider:   Tonny Bollman, MD      Signed, Tonny Bollman, MD  07/15/2023 4:32 PM    [facility]

## 2023-07-23 ENCOUNTER — Ambulatory Visit (INDEPENDENT_AMBULATORY_CARE_PROVIDER_SITE_OTHER): Payer: Medicare Other | Admitting: Family Medicine

## 2023-07-23 ENCOUNTER — Encounter: Payer: Self-pay | Admitting: Family Medicine

## 2023-07-23 VITALS — BP 109/76 | HR 59 | Temp 98.2°F | Ht 70.5 in | Wt 197.6 lb

## 2023-07-23 DIAGNOSIS — L989 Disorder of the skin and subcutaneous tissue, unspecified: Secondary | ICD-10-CM

## 2023-07-23 DIAGNOSIS — I739 Peripheral vascular disease, unspecified: Secondary | ICD-10-CM | POA: Diagnosis not present

## 2023-07-23 DIAGNOSIS — I952 Hypotension due to drugs: Secondary | ICD-10-CM | POA: Diagnosis not present

## 2023-07-23 NOTE — Assessment & Plan Note (Signed)
>>  ASSESSMENT AND PLAN FOR HYPOTENSION WRITTEN ON 07/23/2023  2:58 PM BY Bedie Dominey M, MD  Blood pressure at goal today.  Continue his Florinef  and midodrine  per cardiology.

## 2023-07-23 NOTE — Progress Notes (Signed)
   Bryan Armstrong is a 88 y.o. male who presents today for an office visit.  Assessment/Plan:  New/Acute Problems: Skin Lesion Lesion on left leg consistent with slow healing wound.  His exam today shows no signs of infection.  No obvious retained foreign bodies.  He has had some improvement recently however healing is overall slow.  He does have a history of PAD which is probably contributing to his slow wound healing as well.  Has granulation tissue present.  He will continue with wound management.  Instructed patient to keep the area clean and dry.  Recommended medical grade manuka honey and gentle compression to the area.  He will let us know if not improving in the next couple of weeks and would consider excisional biopsy versus referral to dermatology or wound care clinic at that time.  Chronic Problems Addressed Today: Hypotension Blood pressure at goal today.  Continue his Florinef and midodrine per cardiology.  PAD (peripheral artery disease) (HCC) Following with vascular surgery.  Likely contributing to above slow wound healing.  He is on aspirin and statin.    Subjective:  HPI:  See Assessment / plan for status of chronic conditions.  Patient here today with persistent wound on left leg.  Started a few months ago.  Initially injured the area while trimming bushes outside.  He had a mild scrape to the area.  He has been try to keep the area clean and dry.  Using topical Neosporin however area still not healed up.  Gets occasional drainage.  Occasional bleeding.  No redness.  No pain.  No purulent discharge.  He does think the area is improving however very slowly.       Objective:  Physical Exam: BP 109/76   Pulse (!) 59   Temp 98.2 F (36.8 C) (Temporal)   Ht 5' 10.5" (1.791 m)   Wt 197 lb 9.6 oz (89.6 kg)   SpO2 99%   BMI 27.95 kg/m   Gen: No acute distress, resting comfortably Skin: Approximately 1 cm open wound with granulation tissue on left lateral lower leg.  No  surrounding erythema. Neuro: Grossly normal, moves all extremities Psych: Normal affect and thought content      [place] M. Jimmey Ralph, MD 07/23/2023 3:00 PM

## 2023-07-23 NOTE — Assessment & Plan Note (Signed)
Following with vascular surgery.  Likely contributing to above slow wound healing.  He is on aspirin and statin.

## 2023-07-23 NOTE — Patient Instructions (Signed)
It was very nice to see you today!  Please try medical grade manuka honey ointment to your wound.  Please put gentle compression of the wound as well.  Let me know if proving in the next couple of weeks.  Return if symptoms worsen or fail to improve.   Take care, Dr Jimmey Ralph  PLEASE NOTE:  If you had any lab tests, please let us know if you have not heard back within a few days. You may see your results on mychart before we have a chance to review them but we will give you a call once they are reviewed by [place].   If we ordered any referrals today, please let us know if you have not heard from their office within the next week.   If you had any urgent prescriptions sent in today, please check with the pharmacy within an hour of our visit to make sure the prescription was transmitted appropriately.   Please try these tips to maintain a healthy lifestyle:  Eat at least 3 REAL meals and 1-2 snacks per day.  Aim for no more than 5 hours between eating.  If you eat breakfast, please do so within one hour of getting up.   Each meal should contain half fruits/vegetables, one quarter protein, and one quarter carbs (no bigger than a computer mouse)  Cut down on sweet beverages. This includes juice, soda, and sweet tea.   Drink at least 1 glass of water with each meal and aim for at least 8 glasses per day  Exercise at least 150 minutes every week.

## 2023-07-23 NOTE — Assessment & Plan Note (Signed)
Blood pressure at goal today.  Continue his Florinef and midodrine per cardiology.

## 2023-07-27 ENCOUNTER — Ambulatory Visit: Payer: Medicare Other | Admitting: Cardiovascular Disease

## 2023-07-31 ENCOUNTER — Telehealth: Payer: Self-pay

## 2023-07-31 NOTE — Telephone Encounter (Signed)
Dr. Excell Seltzer,  You saw this patient on 07/15/2023. Per office protocol, will you please comment on medical clearance for right total knee arthroplasty on 10/26/2023?  Please route your response to P CV DIV Preop. I will communicate with requesting office once you have given recommendations.   Thank you!  Carlos Levering, NP

## 2023-07-31 NOTE — Telephone Encounter (Signed)
..     Pre-operative Risk Assessment    Patient Name: Bryan Armstrong  DOB: 02/07/1935 MRN: [id number]      Request for Surgical Clearance    Procedure:   RT TOTAL KNEE ARTHROPLASTY  Date of Surgery:  Clearance 10/26/23                                 Surgeon:  DR Ollen Gross Surgeon's Group or Practice Name:  [facility] Phone number:  [phone] Fax number:  [phone]   Type of Clearance Requested:   - Medical  - Pharmacy:  Hold Aspirin     Type of Anesthesia:   CHOICE   Additional requests/questions:   LAST OV 11/16/22 COOPER, 05/15/23 KLEIN. NEXT APPT 11/17/23  Signed, Renee Ramus   07/31/2023, 11:44 AM

## 2023-08-04 ENCOUNTER — Telehealth: Payer: Self-pay | Admitting: Pulmonary Disease

## 2023-08-04 NOTE — Telephone Encounter (Signed)
Will await Dr. Matilde Bash response.

## 2023-08-04 NOTE — Telephone Encounter (Signed)
[employer]. Advair will not be on this PT formulary next year Here are some alternatives:  Wixela (Preferred) Symbicort  They have spoken to the PT and he is open to the change.    [phone] [id number] Jilda Panda

## 2023-08-05 NOTE — Telephone Encounter (Signed)
Please switch him to [facility] inhaler 250/51 puff twice daily

## 2023-08-06 ENCOUNTER — Ambulatory Visit (INDEPENDENT_AMBULATORY_CARE_PROVIDER_SITE_OTHER): Payer: Medicare Other | Admitting: Family Medicine

## 2023-08-06 ENCOUNTER — Encounter: Payer: Self-pay | Admitting: Family Medicine

## 2023-08-06 VITALS — BP 109/64 | HR 67 | Temp 97.7°F | Ht 70.5 in | Wt 197.0 lb

## 2023-08-06 DIAGNOSIS — I739 Peripheral vascular disease, unspecified: Secondary | ICD-10-CM | POA: Diagnosis not present

## 2023-08-06 DIAGNOSIS — L989 Disorder of the skin and subcutaneous tissue, unspecified: Secondary | ICD-10-CM | POA: Diagnosis not present

## 2023-08-06 DIAGNOSIS — C44719 Basal cell carcinoma of skin of left lower limb, including hip: Secondary | ICD-10-CM | POA: Diagnosis not present

## 2023-08-06 DIAGNOSIS — I952 Hypotension due to drugs: Secondary | ICD-10-CM | POA: Diagnosis not present

## 2023-08-06 MED ORDER — MUPIROCIN 2 % EX OINT: 1.0000 | TOPICAL_OINTMENT | Freq: Two times a day (BID) | CUTANEOUS | 0 refills | Status: DC

## 2023-08-06 NOTE — Patient Instructions (Addendum)
It was very nice to see you today!  We performed a shave biopsy on your lesion today.  This should heal up over the next several days.  Please use antibiotic ointment to the area.  Let me know if not healing in the next 1 to 2 weeks.  We will call you back with pathology report once it is available.  Return if symptoms worsen or fail to improve.   Take care, Dr Jimmey Ralph  PLEASE NOTE:  If you had any lab tests, please let us know if you have not heard back within a few days. You may see your results on mychart before we have a chance to review them but we will give you a call once they are reviewed by [place].   If we ordered any referrals today, please let us know if you have not heard from their office within the next week.   If you had any urgent prescriptions sent in today, please check with the pharmacy within an hour of our visit to make sure the prescription was transmitted appropriately.   Please try these tips to maintain a healthy lifestyle:  Eat at least 3 REAL meals and 1-2 snacks per day.  Aim for no more than 5 hours between eating.  If you eat breakfast, please do so within one hour of getting up.   Each meal should contain half fruits/vegetables, one quarter protein, and one quarter carbs (no bigger than a computer mouse)  Cut down on sweet beverages. This includes juice, soda, and sweet tea.   Drink at least 1 glass of water with each meal and aim for at least 8 glasses per day  Exercise at least 150 minutes every week.

## 2023-08-06 NOTE — Assessment & Plan Note (Signed)
>>  ASSESSMENT AND PLAN FOR HYPOTENSION WRITTEN ON 08/06/2023  2:12 PM BY Artisha Capri M, MD  Blood pressure at goal today.  Continue Florinef  and midodrine .

## 2023-08-06 NOTE — Progress Notes (Signed)
   Bryan Armstrong is a 88 y.o. male who presents today for an office visit.  Assessment/Plan:  New/Acute Problems: Skin lesion Patient with nonhealing leg wound for the last several weeks.  He does have modest improvement since our last visit though still has persistence.  We performed shave biopsy today.  See below procedure note.  He tolerated well.  We discussed wound care.  He will let us know if he develops any complications.   Chronic Problems Addressed Today: Hypotension Blood pressure at goal today.  Continue Florinef and midodrine.  PAD (peripheral artery disease) (HCC) Along with vascular surgery.  May be contributing to slow wound healing that we performed skin biopsy today as above.  He is on aspirin and statin.     Subjective:  HPI:  See A/P for status of chronic conditions.  Patient is here for follow-up.  We saw him 2 weeks ago for persistent wound on left lower leg.  We discussed wound care at his last office visit.  He thinks it may have improved some though still has an open area.       Objective:  Physical Exam: BP 109/64 Comment: left arm  Pulse 67   Temp 97.7 F (36.5 C) (Temporal)   Ht 5' 10.5" (1.791 m)   Wt 197 lb (89.4 kg)   SpO2 96%   BMI 27.87 kg/m   Gen: No acute distress, resting comfortably Skin: Approximately 1 cm open wound with granulation tissue and rolled borders on left lateral leg.  No surrounding erythema. Neuro: Grossly normal, moves all extremities Psych: Normal affect and thought content  Shave Biopsy Procedure Note  Pre-operative Diagnosis: Suspicious lesion  Post-operative Diagnosis: same  Locations:Left Lateral Leg  Indications: Diagnostic  Anesthesia: Lidocaine 1% with epinephrine with added sodium bicarbonate  Procedure Details  History of allergy to iodine: no  Patient informed of the risks (including bleeding and infection) and benefits of the  procedure and Verbal informed consent obtained.  The lesion and  surrounding area were given a sterile prep using betadyne and draped in the usual sterile fashion. A scalpel was used to shave an area of skin approximately 1cm by 1cm.  Hemostasis achieved with silver nitrate and pressure. Antibiotic ointment and a sterile dressing applied.  The specimen was sent for pathologic examination. The patient tolerated the procedure well.  EBL: 2 ml  Findings: Suspicious lesion  Condition: Stable  Complications: none.  Plan: 1. Instructed to keep the wound dry and covered for 24-48h and clean thereafter. 2. Warning signs of infection were reviewed.   3. Recommended that the patient use OTC analgesics as needed for pain.       Katina Degree. Jimmey Ralph, MD 08/06/2023 2:14 PM

## 2023-08-06 NOTE — Assessment & Plan Note (Signed)
Along with vascular surgery.  May be contributing to slow wound healing that we performed skin biopsy today as above.  He is on aspirin and statin.

## 2023-08-06 NOTE — Telephone Encounter (Signed)
ATC patient--unable to leave vm due to mailbox not being setup.  °

## 2023-08-06 NOTE — Assessment & Plan Note (Signed)
Blood pressure at goal today.  Continue Florinef and midodrine.

## 2023-08-07 ENCOUNTER — Telehealth: Payer: Self-pay | Admitting: Student-PharmD

## 2023-08-07 NOTE — Progress Notes (Signed)
This patient is appearing on a report for being at risk of failing the adherence measure for cholesterol (statin) medications this calendar year.   Medication: rosuvastatin 20mg  tablet Last fill date: 07/15/2023 for 90 day supply according to [facility].  Pt adherent to medication. No phone call made.  Bryan Armstrong

## 2023-08-11 LAB — DERMATOLOGY PATHOLOGY

## 2023-08-11 MED ORDER — FLUTICASONE-SALMETEROL 250-50 MCG/ACT IN AEPB: 1.0000 | INHALATION_SPRAY | Freq: Two times a day (BID) | RESPIRATORY_TRACT | 11 refills | Status: DC

## 2023-08-11 NOTE — Telephone Encounter (Signed)
Called and spoke with pt about switching inhalers. He had a bunch of questions only the pharmacy or his insurance plan can answer. He verbalized understanding and stated he might call back if the new inhaler is expensive. Nfn at this time

## 2023-08-12 ENCOUNTER — Other Ambulatory Visit: Payer: Self-pay | Admitting: *Deleted

## 2023-08-12 DIAGNOSIS — C4491 Basal cell carcinoma of skin, unspecified: Secondary | ICD-10-CM

## 2023-08-12 NOTE — Telephone Encounter (Signed)
   Patient Name: Bryan Armstrong  DOB: 07-17-1935 MRN: [id number]  Primary Cardiologist: Tonny Bollman, MD  Chart reviewed as part of pre-operative protocol coverage. Given past medical history and time since last visit, based on ACC/AHA guidelines, [name] is at acceptable risk for the planned procedure without further cardiovascular testing.  Per Dr. Excell Seltzer patient is fine to proceed with scheduled procedure with no additional testing needed at this time.  The patient was advised that if he develops new symptoms prior to surgery to contact our office to arrange for a follow-up visit, and he verbalized understanding.  Regarding ASA therapy, we recommend continuation of ASA throughout the perioperative period.  However, if the surgeon feels that cessation of ASA is required in the perioperative period, it may be stopped 5-7 days prior to surgery with a plan to resume it as soon as felt to be feasible from a surgical standpoint in the post-operative period.   I will route this recommendation to the requesting party via Epic fax function and remove from pre-op pool.  Please call with questions.  Napoleon Form, Leodis Rains, NP 08/12/2023, 10:15 AM

## 2023-08-12 NOTE — Progress Notes (Signed)
His biopsy report showed that he has a type of skin cancer called a basal cell carcinoma.  This is very slow-growing and should not cause him any major issues however it does need to be fully removed.  Recommend referral to dermatology.

## 2023-08-12 NOTE — Telephone Encounter (Signed)
Reviewed recent office note. Pt asymptomatic from a cardiac standpoint - specifically with no angina, dyspnea, or symptoms of arrhythmia. His CV risk factors are all well-controlled. He can proceed with planned surgery at low cardiac risk. No cardiac testing is indicated at this time. thx

## 2023-08-13 ENCOUNTER — Ambulatory Visit (INDEPENDENT_AMBULATORY_CARE_PROVIDER_SITE_OTHER): Payer: Medicare Other | Admitting: Family Medicine

## 2023-08-13 ENCOUNTER — Telehealth: Payer: Self-pay

## 2023-08-13 VITALS — BP 134/69 | HR 65 | Temp 98.2°F | Ht 70.5 in | Wt 197.6 lb

## 2023-08-13 DIAGNOSIS — I952 Hypotension due to drugs: Secondary | ICD-10-CM

## 2023-08-13 DIAGNOSIS — C4491 Basal cell carcinoma of skin, unspecified: Secondary | ICD-10-CM

## 2023-08-13 NOTE — Progress Notes (Signed)
   Bryan Armstrong is a 88 y.o. male who presents today for an office visit.  Assessment/Plan:  New/Acute Problems: Basal cell carcinoma He will follow-up with dermatology next week for excision.  He does have a small open area that is healing routinely.  He will continue with routine dressing changes.  He will let us know if he needs any further assistance with this.  Chronic Problems Addressed Today: Hypotension Blood pressure at goal today on Florinef and midodrine.     Subjective:  HPI:  See Assessment / plan for status of chronic conditions.  Patient is here today for follow-up.  We saw him a week ago.  We performed shave biopsy.  This came back positive for basal cell carcinoma.  He was referred to dermatology and will be seeing them next week.  He has done well since our biopsy last week.        Objective:  Physical Exam: BP 134/69   Pulse 65   Temp 98.2 F (36.8 C) (Temporal)   Ht 5' 10.5" (1.791 m)   Wt 197 lb 9.6 oz (89.6 kg)   SpO2 96%   BMI 27.95 kg/m   Gen: No acute distress, resting comfortably CV: Regular rate and rhythm with no murmurs appreciated Pulm: Normal work of breathing, clear to auscultation bilaterally with no crackles, wheezes, or rhonchi Skin: Healing approximately 1 cm shallow lesion on left anterior shin at place of previous biopsy site.  No surrounding erythema.  No drainage. Neuro: Grossly normal, moves all extremities Psych: Normal affect and thought content      Berenise Hunton M. Jimmey Ralph, MD 08/13/2023 2:20 PM

## 2023-08-13 NOTE — Patient Instructions (Addendum)
It was very nice to see you today!  Please keep your area bandaged.  Let us know if we can be of any further assistance.  Return if symptoms worsen or fail to improve.   Take care, Dr Jimmey Ralph  PLEASE NOTE:  If you had any lab tests, please let us know if you have not heard back within a few days. You may see your results on mychart before we have a chance to review them but we will give you a call once they are reviewed by [place].   If we ordered any referrals today, please let us know if you have not heard from their office within the next week.   If you had any urgent prescriptions sent in today, please check with the pharmacy within an hour of our visit to make sure the prescription was transmitted appropriately.   Please try these tips to maintain a healthy lifestyle:  Eat at least 3 REAL meals and 1-2 snacks per day.  Aim for no more than 5 hours between eating.  If you eat breakfast, please do so within one hour of getting up.   Each meal should contain half fruits/vegetables, one quarter protein, and one quarter carbs (no bigger than a computer mouse)  Cut down on sweet beverages. This includes juice, soda, and sweet tea.   Drink at least 1 glass of water with each meal and aim for at least 8 glasses per day  Exercise at least 150 minutes every week.

## 2023-08-13 NOTE — Assessment & Plan Note (Signed)
Blood pressure at goal today on Florinef and midodrine.

## 2023-08-13 NOTE — Telephone Encounter (Signed)
The patient contacted the office to inform us that he has been diagnosed with basal cell carcinoma on his left lower leg. He is scheduled to follow up with Dr. Aris Lot on August 18, 2023, regarding this condition.

## 2023-08-13 NOTE — Assessment & Plan Note (Signed)
>>  ASSESSMENT AND PLAN FOR HYPOTENSION WRITTEN ON 08/13/2023  2:20 PM BY Tyberius Ryner M, MD  Blood pressure at goal today on Florinef  and midodrine .

## 2023-08-18 ENCOUNTER — Telehealth: Payer: Self-pay | Admitting: Family Medicine

## 2023-08-18 DIAGNOSIS — C44719 Basal cell carcinoma of skin of left lower limb, including hip: Secondary | ICD-10-CM | POA: Diagnosis not present

## 2023-08-18 NOTE — Telephone Encounter (Signed)
Caller states patient had a biopsy done on his left leg in office but has recently followed up with [facility] today. States they saw him for this but before they can treat him, they need the pathology report from the biopsy.   States this can be faxed to [phone]. For additional information, Victorino Dike can be contacted @ [phone].

## 2023-08-19 ENCOUNTER — Telehealth: Payer: Self-pay | Admitting: Family Medicine

## 2023-08-19 NOTE — Telephone Encounter (Signed)
error 

## 2023-08-19 NOTE — Telephone Encounter (Signed)
Results faxed to [phone] today

## 2023-09-01 DIAGNOSIS — C44719 Basal cell carcinoma of skin of left lower limb, including hip: Secondary | ICD-10-CM | POA: Diagnosis not present

## 2023-09-03 NOTE — Telephone Encounter (Signed)
Telephone call  

## 2023-09-14 ENCOUNTER — Telehealth: Payer: Self-pay | Admitting: Family Medicine

## 2023-09-14 DIAGNOSIS — I4510 Unspecified right bundle-branch block: Secondary | ICD-10-CM | POA: Diagnosis not present

## 2023-09-14 DIAGNOSIS — I959 Hypotension, unspecified: Secondary | ICD-10-CM | POA: Diagnosis not present

## 2023-09-14 DIAGNOSIS — R55 Syncope and collapse: Secondary | ICD-10-CM | POA: Diagnosis not present

## 2023-09-14 DIAGNOSIS — R42 Dizziness and giddiness: Secondary | ICD-10-CM | POA: Diagnosis not present

## 2023-09-14 DIAGNOSIS — E860 Dehydration: Secondary | ICD-10-CM | POA: Diagnosis not present

## 2023-09-14 NOTE — Telephone Encounter (Signed)
Final Outcome: Call EMS 911 Now. Per note below, pt agreed to comply.     Patient Name First: Bryan Last: Armstrong Gender: Male DOB: 12/20/1934 Age: 88 Y 9 M 13 D Return Phone Number: [phone] (Primary) Address: City/ State/ Zip: [place]  [address] Client [facility], [facility] Provider Jacquiline Doe- MD Contact Type Call Who Is Calling Patient / Member / Family / Caregiver Call Type Triage / Clinical Relationship To Patient Self Return Phone Number [phone] (Primary) Chief Complaint Head Injury (non urgent symptom) Reason for Call Symptomatic / Request for Health Information Initial Comment Caller states that patient has vertigo. He fell and hit his head on a chair. No change in behavior Additional Comment Unable to leave vm, 'vm not activated'. Translation No Nurse Assessment Nurse: Darlin Coco, RN, Charyl Dancer Date/Time (Eastern Time): 09/14/2023 11:26:44 AM Confirm and document reason for call. If symptomatic, describe symptoms. ---Caller states he fell & hit his head on wicker chair, has a scrape & unsure how to 'bandage it'; happened 1.5hrs ago. States he had vertigo previously, felt that way prior to falling, passed out <18min. Denies any other injuries/symptoms. States he is alone. Does the patient have any new or worsening symptoms? ---Yes Will a triage be completed? ---Yes Related visit to physician within the last 2 weeks? ---N/A Does the PT have any chronic conditions? (i.e. diabetes, asthma, this includes High risk factors for pregnancy, etc.) ---Yes List chronic conditions. ---low bp , followed by cardiologist. Is this a behavioral health or substance abuse call? ---No Guidelines Guideline Title Affirmed Question Affirmed Notes Nurse Date/Time (Eastern Time) Fainting History of heart problems (e.g., congestive heart failure, heart attack) Cazares,  RN, Charyl Dancer 09/14/2023 11:29:18 AM Disp. Time Lamount Cohen Time) Disposition Final User 09/14/2023 11:25:15 AM Attempt made - no message left Darlin Coco, RN, Charyl Dancer 09/14/2023 11:32:49 AM Call EMS 911 Now Yes Cazares, RN, Charyl Dancer 09/14/2023 11:39:09 AM 911 Outcome Documentation Cazares, RN, Charyl Dancer Reason: 911 outcome disposition follow-up call; caller states ems en route. Final Disposition 09/14/2023 11:32:49 AM Call EMS 911 Now Yes Cazares, RN, Charyl Dancer Caller Disagree/Comply Comply Caller Understands Yes PreDisposition InappropriateToAsk Care Advice Given Per Guideline CALL EMS 911 NOW: * Immediate medical attention is needed. You need to hang up and call 911 (or an ambulance). * Triager Discretion: I'll call you back in a few minutes to be sure you were able to reach them. CARE ADVICE given per Fainting (Adult) guideline.

## 2023-09-14 NOTE — Telephone Encounter (Signed)
FYI: This call has been transferred to triage nurse: Access Nurse. Once the result note has been entered staff can address the message at that time.  Patient called in with the following symptoms:  Red Word: Fall due to dizziness--hit head on whicker chair   Please advise at [facility] [phone]  Message is routed to Provider Pool.

## 2023-09-15 ENCOUNTER — Telehealth: Payer: Self-pay | Admitting: Internal Medicine

## 2023-09-15 NOTE — Telephone Encounter (Signed)
Patient will like to speak the dr about a fall that he and discuss his knee surgery. Please advise

## 2023-09-15 NOTE — Telephone Encounter (Signed)
Patient reports he fell yesterday. Denies any dizziness/lightheadedness or feeling faint prior to fall. He hit a large wicker chair and sustained multiple cuts/scrapes to his face, arms, legs and shoulders. He reports muscular pain today. EMS came out yesterday and provided first aid, patient did not need emergent care.  Patient states his low BP has been difficult to treat, he has been to neurologist, cardiologist and ENT. He states he has been working with Dr. Graciela Husbands to treat low BP. He is having episodes of dizziness/feeling faint 2-3 times per week, for which he can sit down and symptoms subside. He did not experience this with this fall yesterday, there was no warning prior to fall.  Patient states he is going to cancel his knee surgery scheduled for January 13th because he does not feel his knee is what is giving him trouble but his low BP.  Patient was calling to see if he could see Dr. Graciela Husbands sooner than February appt. Will forward to Dr. Odessa Fleming nurse.

## 2023-09-15 NOTE — Telephone Encounter (Signed)
Spoke with patient, stated EMS came to his house and bandage his head wound.  Patient spoke with his Cardiology due to BP running low, dizzy with low BP  BP today 132/77 asymptomatic

## 2023-09-15 NOTE — Telephone Encounter (Signed)
Patient is following up requesting to speak with someone soon.

## 2023-09-15 NOTE — Telephone Encounter (Signed)
Spoke with pt and appointment scheduled with Francis Dowse on 09/16/2025 at 245pm for further evaluation of low blood pressure.  Reveiwed ED precautions and encouraged pt not to drive.  Pt verbalizes understanding and thanked Charity fundraiser for the callback.

## 2023-09-16 NOTE — Telephone Encounter (Signed)
Noted. Looks like he will be seeing cardiology tomorrow.  Katina Degree. Jimmey Ralph, MD 09/16/2023 7:37 AM

## 2023-09-16 NOTE — Progress Notes (Unsigned)
Cardiology Office Note:  .   Date:  09/16/2023  ID:  Bryan Armstrong, DOB [birth year], MRN [id number] PCP: Bryan Dark, MD  [facility] Providers Cardiologist:  Bryan Bollman, MD Electrophysiologist:  Bryan Manges, MD {  History of Present Illness: Bryan Armstrong is a 88 y.o. male w/PMHx of RBBB, orthostatic hypotension, COPD/emphysema, TIA, PVD (hx of left femoral endarterectomy/patch angioplasty 2021), neuropathy  He saw Dr. Graciela Armstrong 05/15/23, on Proamatine TID with improved symptoms though over the summer, worse again and Florinef added.  AT this visit still some lightheadedness, no syncope Markedky abnormal orthostatic BPs this visit. Florinef increased from 3x/week to daily and avised compression shorts  Saw dr. Excell Armstrong 07/15/23, doing better. Noted asymmetric BPs with suspected RUE arterial disease, though no symptoms, advised ongoing ASA, statin, with recent carotid US that was [facility]  October pre-op clearance for knee surgery requested > completed  09/15/23, pt called reported a fall but not syncope and advised to come in   Today's visit is scheduled for reports of low BP/fainting  ROS:   He reports that the event on Monday was different in tnhat he had no warning. Had woken, sat at the edge of the bed a while as usual, and after 10 steps or so, went out, he had no presyncopal dizziness or symptoms, felt weak afterwards, noticed he was bleeding from his cheek and had fallen on a chair. He did call 911 They discussed with him his poor hydration and had him drink to big glasses of water while they were there with him  He reports self stopping the fludrocortisone rporting the every other day didn't work and the daily make him uncomfortably swollen. He does take his midodrine but some days missed a midday dose...like today, forgot to take it before leaving  He does not drink water at all Typically has milk with breakfast and that is about all he drinks in total He does  not eat particularly salty or add salt though admits he has been advised to!  He typically has warning with his events and time to get seated. No CP, palpitations, SOB  Studies Reviewed: Marland Kitchen    EKG done today and reviewed by myself:  SR, RBBB (old) 60bpm  Aug 2021: monitor 1. SR/SB/ST 2. No signif arrhythmias noted  04/18/2020: TTE 1. Left ventricular ejection fraction, by estimation, is 60 to 65%. The  left ventricle has normal function. The left ventricle has no regional  wall motion abnormalities. Left ventricular diastolic parameters were  normal.   2. Right ventricular systolic function is normal. The right ventricular  size is normal.   3. Left atrial size was mildly dilated.   4. The mitral valve is normal in structure. Trivial mitral valve  regurgitation. No evidence of mitral stenosis.   5. The aortic valve is normal in structure. Aortic valve regurgitation is  not visualized. Mild aortic valve sclerosis is present, with no evidence  of aortic valve stenosis.   6. The inferior vena cava is normal in size with greater than 50%  respiratory variability, suggesting right atrial pressure of 3 mmHg.    Risk Assessment/Calculations:    Physical Exam:   VS:  There were no vitals taken for this visit.   Wt Readings from Last 3 Encounters:  08/13/23 197 lb 9.6 oz (89.6 kg)  08/06/23 197 lb (89.4 kg)  07/23/23 197 lb 9.6 oz (89.6 kg)    GEN: Well nourished, well developed in no  acute distress NECK: No JVD; No carotid bruits CARDIAC: RRR, no murmurs, rubs, gallops RESPIRATORY:  CTA b/l without rales, wheezing or rhonchi  ABDOMEN: Soft, non-tender, non-distended EXTREMITIES:  No edema; No deformity    ASSESSMENT AND PLAN: .     Orthostatic hypotension Well known for him Long discussion on the importance of hydration and adding some sodium into his diet He did get compression shorts but he doesn't think they are tight enough, doesn't wear them  This event was different  in that it was abrupt and without warning > will have him wear a monitor  PVD  hx of R LE intervention Suspect RUE arterial disease with uneven BPs TODAY Left: 124/68 Right 92/60 w/palpable radial pulse, no signs of ischemia and no reports of symptoms of claudication   Advise BPs should be taken on the left   Dispo: he already has a visit with dr. Graciela Armstrong in [place] that, sooner if needed  Signed, Bryan Pigeon, PA-C

## 2023-09-17 ENCOUNTER — Encounter: Payer: Self-pay | Admitting: Physician Assistant

## 2023-09-17 ENCOUNTER — Ambulatory Visit: Payer: Medicare Other | Attending: Physician Assistant | Admitting: Physician Assistant

## 2023-09-17 ENCOUNTER — Other Ambulatory Visit: Payer: Self-pay | Admitting: *Deleted

## 2023-09-17 ENCOUNTER — Other Ambulatory Visit: Payer: Medicare Other

## 2023-09-17 ENCOUNTER — Other Ambulatory Visit (INDEPENDENT_AMBULATORY_CARE_PROVIDER_SITE_OTHER): Payer: Medicare Other

## 2023-09-17 VITALS — BP 124/68 | HR 65 | Ht 71.0 in | Wt 197.0 lb

## 2023-09-17 DIAGNOSIS — I739 Peripheral vascular disease, unspecified: Secondary | ICD-10-CM | POA: Diagnosis not present

## 2023-09-17 DIAGNOSIS — I951 Orthostatic hypotension: Secondary | ICD-10-CM | POA: Diagnosis not present

## 2023-09-17 DIAGNOSIS — R55 Syncope and collapse: Secondary | ICD-10-CM

## 2023-09-17 DIAGNOSIS — I4510 Unspecified right bundle-branch block: Secondary | ICD-10-CM

## 2023-09-17 NOTE — Progress Notes (Unsigned)
ZIO AT serial # [id number] from office inventory applied to patient.  Dr. Graciela Husbands to read.

## 2023-09-17 NOTE — Patient Instructions (Addendum)
Medication Instructions:    STOP TAKING :  FLORINEF  0.1 MG   *If you need a refill on your cardiac medications before your next appointment, please call your pharmacy*   Lab Work: NONE ORDERED  TODAY    If you have labs (blood work) drawn today and your tests are completely normal, you will receive your results only by: MyChart Message (if you have MyChart) OR A paper copy in the mail If you have any lab test that is abnormal or we need to change your treatment, we will call you to review the results.   Testing/Procedures: Your physician has recommended that you wear an event monitor. Event monitors are medical devices that record the heart's electrical activity. Doctors most often [place] these monitors to diagnose arrhythmias. Arrhythmias are problems with the speed or rhythm of the heartbeat. The monitor is a small, portable device. You can wear one while you do your normal daily activities. This is usually used to diagnose what is causing palpitations/syncope (passing out).    Follow-Up: At [facility], you and your health needs are our priority.  As part of our continuing mission to provide you with exceptional heart care, we have created designated Provider Care Teams.  These Care Teams include your primary Cardiologist (physician) and Advanced Practice Providers (APPs -  Physician Assistants and Nurse Practitioners) who all work together to provide you with the care you need, when you need it.  We recommend signing up for the patient portal called "MyChart".  Sign up information is provided on this After Visit Summary.  MyChart is used to connect with patients for Virtual Visits (Telemedicine).  Patients are able to view lab/test results, encounter notes, upcoming appointments, etc.  Non-urgent messages can be sent to your provider as well.   To learn more about what you can do with MyChart, go to [email or web address].    Your next appointment:  AS SCHEDULED   Provider:    You may see Sherryl Manges, MD      Other Instructions   ZIO AT Long term monitor-Live Telemetry  Your physician has requested you wear a ZIO patch monitor for 14 days.  This is a single patch monitor. [employer] supplies one patch monitor per enrollment. Additional  stickers are not available.  Please do not apply patch if you will be having a Nuclear Stress Test, Echocardiogram, Cardiac CT, MRI,  or Chest Xray during the period you would be wearing the monitor. The patch cannot be worn during  these tests. You cannot remove and re-apply the ZIO AT patch monitor.  Your ZIO patch monitor will be mailed 3 day USPS to your address on file. It may take 3-5 days to  receive your monitor after you have been enrolled.  Once you have received your monitor, please review the enclosed instructions. Your monitor has  already been registered assigning a specific monitor serial # to you.   Billing and Patient Assistance Program information  Bryan Armstrong has been supplied with any insurance information on record for billing. [employer] offers a sliding scale Patient Assistance Program for patients without insurance, or whose  insurance does not completely cover the cost of the ZIO patch monitor. You must apply for the  Patient Assistance Program to qualify for the discounted rate. To apply, call [employer] at [phone],  select option 4, select option 2 , ask to apply for the Patient Assistance Program, (you can request an  interpreter if needed). [employer] will ask your household income  and how many people are in your  household. [employer] will quote your out-of-pocket cost based on this information. They will also be able  to set up a 12 month interest free payment plan if needed.  Applying the monitor   Shave hair from upper left chest.  Hold the abrader disc by orange tab. Rub the abrader in 40 strokes over left upper chest as indicated in  your monitor instructions.  Clean area with 4 enclosed alcohol  pads. Use all pads to ensure the area is cleaned thoroughly. Let  dry.  Apply patch as indicated in monitor instructions. Patch will be placed under collarbone on left side of  chest with arrow pointing upward.  Rub patch adhesive wings for 2 minutes. Remove the white label marked "1". Remove the white label  marked "2". Rub patch adhesive wings for 2 additional minutes.  While looking in a mirror, press and release button in center of patch. A small green light will flash 3-4  times. This will be your only indicator that the monitor has been turned on.  Do not shower for the first 24 hours. You may shower after the first 24 hours.  Press the button if you feel a symptom. You will hear a small click. Record Date, Time and Symptom in  the Patient Log.   Starting the Gateway  In your kit there is a Audiological scientist box the size of a cellphone. This is Buyer, retail. It transmits all your  recorded data to [facility]. This box must always stay within 10 feet of you. Open the box and push the *  button. There will be a light that blinks orange and then green a few times. When the light stops  blinking, the Gateway is connected to the ZIO patch. Call [employer] at [phone] to confirm your monitor is transmitting.  Returning your monitor  Remove your patch and place it inside the Gateway. In the lower half of the Gateway there is a white  bag with prepaid postage on it. Place Gateway in bag and seal. Mail package back to [place] as soon as  possible. Your physician should have your final report approximately 7 days after you have mailed back  your monitor. Call [facility] Customer Care at [phone] if you have questions regarding your ZIO AT  patch monitor. Call them immediately if you see an orange light blinking on your monitor.  If your monitor falls off in less than 4 days, contact our Monitor department at [phone]. If your  monitor becomes loose or falls off after 4 days  call [employer] at [phone] for suggestions on  securing your monitor

## 2023-09-17 NOTE — Progress Notes (Unsigned)
Enrolled patient for a 14 day Zio AT monitor to be mailed to patients home   Bryan Armstrong to read

## 2023-09-18 DIAGNOSIS — R55 Syncope and collapse: Secondary | ICD-10-CM | POA: Diagnosis not present

## 2023-09-21 ENCOUNTER — Inpatient Hospital Stay: Payer: Medicare Other

## 2023-09-21 ENCOUNTER — Inpatient Hospital Stay: Payer: Medicare Other | Attending: Oncology | Admitting: Oncology

## 2023-09-21 VITALS — BP 140/76 | HR 56 | Temp 98.1°F | Resp 18 | Ht 71.0 in | Wt 200.5 lb

## 2023-09-21 DIAGNOSIS — D472 Monoclonal gammopathy: Secondary | ICD-10-CM | POA: Insufficient documentation

## 2023-09-21 DIAGNOSIS — E538 Deficiency of other specified B group vitamins: Secondary | ICD-10-CM | POA: Insufficient documentation

## 2023-09-21 DIAGNOSIS — J449 Chronic obstructive pulmonary disease, unspecified: Secondary | ICD-10-CM | POA: Diagnosis not present

## 2023-09-21 LAB — CBC WITH DIFFERENTIAL (CANCER CENTER ONLY)
Abs Immature Granulocytes: 0.02 10*3/uL (ref 0.00–0.07)
Basophils Absolute: 0.1 10*3/uL (ref 0.0–0.1)
Basophils Relative: 1 %
Eosinophils Absolute: 0.4 10*3/uL (ref 0.0–0.5)
Eosinophils Relative: 5 %
HCT: 38.7 % — ABNORMAL LOW (ref 39.0–52.0)
Hemoglobin: 12.5 g/dL — ABNORMAL LOW (ref 13.0–17.0)
Immature Granulocytes: 0 %
Lymphocytes Relative: 17 %
Lymphs Abs: 1.1 10*3/uL (ref 0.7–4.0)
MCH: 29 pg (ref 26.0–34.0)
MCHC: 32.3 g/dL (ref 30.0–36.0)
MCV: 89.8 fL (ref 80.0–100.0)
Monocytes Absolute: 0.5 10*3/uL (ref 0.1–1.0)
Monocytes Relative: 7 %
Neutro Abs: 4.7 10*3/uL (ref 1.7–7.7)
Neutrophils Relative %: 70 %
Platelet Count: 174 10*3/uL (ref 150–400)
RBC: 4.31 MIL/uL (ref 4.22–5.81)
RDW: 13.6 % (ref 11.5–15.5)
WBC Count: 6.7 10*3/uL (ref 4.0–10.5)
nRBC: 0 % (ref 0.0–0.2)

## 2023-09-21 LAB — CMP (CANCER CENTER ONLY)
ALT: 31 U/L (ref 0–44)
AST: 23 U/L (ref 15–41)
Albumin: 3.8 g/dL (ref 3.5–5.0)
Alkaline Phosphatase: 58 U/L (ref 38–126)
Anion gap: 4 — ABNORMAL LOW (ref 5–15)
BUN: 20 mg/dL (ref 8–23)
CO2: 31 mmol/L (ref 22–32)
Calcium: 8.6 mg/dL — ABNORMAL LOW (ref 8.9–10.3)
Chloride: 104 mmol/L (ref 98–111)
Creatinine: 1.08 mg/dL (ref 0.61–1.24)
GFR, Estimated: >60 mL/min (ref >60)
Glucose, Bld: 101 mg/dL — ABNORMAL HIGH (ref 70–99)
Potassium: 4.1 mmol/L (ref 3.5–5.1)
Sodium: 139 mmol/L (ref 135–145)
Total Bilirubin: 0.6 mg/dL (ref <1.2)
Total Protein: 6.5 g/dL (ref 6.5–8.1)

## 2023-09-21 NOTE — Progress Notes (Signed)
  [facility] OFFICE PROGRESS NOTE   Diagnosis: Monoclonal gammopathy  INTERVAL HISTORY:   Bryan. Mactavish returns as scheduled.  He is followed by cardiology for orthostasis.  He had a recent syncope event and fall.  No fever or recent infection.  No bleeding.  Objective:  Vital signs in last 24 hours:  Blood pressure (!) 140/76, pulse (!) 56, temperature 98.1 F (36.7 C), temperature source Temporal, resp. rate 18, height 5\' 11"  (1.803 m), weight 200 lb 8 oz (90.9 kg), SpO2 100%.   Lymphatics: No cervical, supraclavicular, axillary, or inguinal nodes Resp: Lungs clear bilaterally Cardio: Regular rate and rhythm GI: No hepatosplenomegaly Vascular: No leg edema  Lab Results:  Lab Results  Component Value Date   WBC 6.7 09/21/2023   HGB 12.5 (L) 09/21/2023   HCT 38.7 (L) 09/21/2023   MCV 89.8 09/21/2023   PLT 174 09/21/2023   NEUTROABS 4.7 09/21/2023    CMP  Lab Results  Component Value Date   NA 139 09/21/2023   K 4.1 09/21/2023   CL 104 09/21/2023   CO2 31 09/21/2023   GLUCOSE 101 (H) 09/21/2023   BUN 20 09/21/2023   CREATININE 1.08 09/21/2023   CALCIUM 8.6 (L) 09/21/2023   PROT 6.5 09/21/2023   ALBUMIN 3.8 09/21/2023   AST 23 09/21/2023   ALT 31 09/21/2023   ALKPHOS 58 09/21/2023   BILITOT 0.6 09/21/2023   GFRNONAA >60 09/21/2023   GFRAA >60 04/18/2020    No results found for: "CEA1", "CEA", "CAN199", "CA125"  Lab Results  Component Value Date   INR 1.0 05/05/2022   LABPROT 13.3 05/05/2022    Imaging:  No results found.  Medications: I have reviewed the patient's current medications.   Assessment/Plan: Serum M spike 0.4 g/dL 10/15/7251 for IgG kappa 01/22/2022 hemoglobin 13.3, white count 7.4, platelet count 158,000; quantitative immunoglobulins in normal range; kappa free light chains 31.8 (3.3-19.4) Serum immunofixation 01/28/2022: Monoclonal IgG kappa protein B12 deficiency, on replacement PVD Orthostatic  hypotension COPD Positive rheumatoid factor II 27 2023     Disposition: Bryan Armstrong was found to have a serum M spike on repeat SPEP send 2023.  Immunofixation electrophoresis confirmed a monoclonal IgG kappa protein.  He appears to have a monoclonal gammopathy of unknown significance.  I have a low clinical suspicion for progression to multiple myeloma.  There is no evidence of another lymphoproliferative disorder.  The serum M spike could be related to underlying rheumatoid arthritis as he had a positive rheumatoid factor in 2023.  The hemoglobin is mildly low today, but it is not significantly changed over the past several years.  We will follow-up on the serum protein electrophoresis from today.  He will return for an office and lab visit in 9 months.  Thornton Papas, MD  09/21/2023  3:01 PM

## 2023-09-22 LAB — KAPPA/LAMBDA LIGHT CHAINS
Kappa free light chain: 34.2 mg/L — ABNORMAL HIGH (ref 3.3–19.4)
Kappa, lambda light chain ratio: 1.41 (ref 0.26–1.65)
Lambda free light chains: 24.3 mg/L (ref 5.7–26.3)

## 2023-09-22 LAB — IGG: IgG (Immunoglobin G), Serum: 1011 mg/dL (ref 603–1613)

## 2023-09-25 LAB — PROTEIN ELECTROPHORESIS, SERUM
A/G Ratio: 1.4 (ref 0.7–1.7)
Albumin ELP: 3.6 g/dL (ref 2.9–4.4)
Alpha-1-Globulin: 0.1 g/dL (ref 0.0–0.4)
Alpha-2-Globulin: 0.7 g/dL (ref 0.4–1.0)
Beta Globulin: 0.8 g/dL (ref 0.7–1.3)
Gamma Globulin: 1 g/dL (ref 0.4–1.8)
Globulin, Total: 2.5 g/dL (ref 2.2–3.9)
M-Spike, %: 0.4 g/dL — ABNORMAL HIGH
Total Protein ELP: 6.1 g/dL (ref 6.0–8.5)

## 2023-09-28 ENCOUNTER — Telehealth: Payer: Self-pay | Admitting: *Deleted

## 2023-09-28 NOTE — Telephone Encounter (Signed)
Notified of stable monoclonal protein. F/U as scheduled.

## 2023-09-28 NOTE — Telephone Encounter (Signed)
-----   Message from Thornton Papas sent at 09/27/2023  6:21 PM EST ----- Please call patient, the monoclonal protein level is stable, f/u as scheduled

## 2023-10-21 NOTE — Progress Notes (Signed)
 [facility] [address] [place]. [place] [address] [phone]       OFFICE FOLLOW-UP NOTE  [name] Date of Birth:  [birth year] Medical Record Number:  [id number]    Primary neurologist: Dr. Rosemarie Reason for visit: hx of TIA, neuropathy, gait impairment   Chief Complaint  Patient presents with   Follow-up    Pt in 8 pt here for stroke f/u  Pt states some nerve in feet and legs Pt states gait is off Pt states Nov 2024 did faint due to low BP       HPI:   Update 10/22/2023 JM: Patient returns for follow-up visit unaccompanied.  He continues to have occasional right eye upper vision loss, occurs about 3 times per month, lasts for about 2 minutes. He can also at times experience right eye outer periphery wavy lines/fluttering in vision which can last a couple of minutes. Not associated with headache. No pain. No other symptoms.  Previously, he would experience the symptoms with low blood pressure but more recently, his blood pressure has been stable when this occurs.  Reports fainting episode beginning of December which led to a fall, was seen by EMS but did not proceed to ED, saw cardiology that day, he noted he self discontinued Florinef  as no benefit with every other day and taking daily because edema. He has since been working on water intake (was not drinking any water previously), he has been drinking 4x 12 oz waters per day, he has noticed great improvement in blood pressure which has been more stable and overall feels better. He no longer takes midodrine  3 times daily as he started to have elevated blood pressure, has only used midodrine  3 times since beginning of December. Has f/u visit with Dr. Fernande next month.  Reports compliance on aspirin  and Crestor .  Had follow-up with PCP in October, scheduled follow-up visit in July.  He continues to have bilateral peripheral neuropathy in his feet. More bothersome at night but does not interfere with sleep. At  times, his toes can curl while laying in bed but improves after stretching. Denies any worsening of paresthesias. Does believe this has affected his overall balance and gait. Continues to do exercises as advised by PT.  He is no longer taking B12 supplement, last B12 check satisfactory in 02/2023.       History provided for reference purposes only 04/08/2023 Dr. Rosemarie: He returns for follow-up after last visit 6 months ago with Bryan nurse practitioner.  He continues to have intermittent transient episodes of dizziness, lightheadedness with right eye altitudinal vision loss in the lower occurring once a month or so.  These episodes last 5 to 7 minutes.  There is no clear triggers but he feels his blood pressure is low.  He takes Florinef  0.1 tablet daily as well as midodrine  up to 4 times a day.  Lab work done on 02/27/2023 vitamin B12 06 20.  LDL cholesterol 59 mg remains tolerating well with minor bruising and no bleeding.  He is tolerating Crestor  well without muscle aches and pains.  He has not had any brain imaging or Doppler studies done for more than 2 years   09/24/2022 JM: Patient returns for 41-month follow-up unaccompanied.    Back in July, episode of transient right eyelid drooping and bilateral fingertip numbness, was seen in ED, MRI negative for acute stroke.  Mention possibly due to low blood pressure.  Cardiology added Florinef  in addition to midodrine  for symptomatic  hypotension, continues to be closely followed by cardiology Dr. Fernande and Dr. Wonda.  Denies any new stroke/TIA symptoms since that time.  Reports stabilization of blood pressure over the past couple of weeks weeks. Does report occasional fluctuation of right vision. Feels like his right eye will flutter and that's how he knows his blood pressure is low. He was seen by Dr. Octavia back in July with extensive vision exam without any concerning findings. He plans on scheduling a f/u visit to further discuss continued vision  issues.   Feels like neuropathy symptoms have leveled off and possibly improved especially since starting B12 oral supplement. Does have persistent bilateral foot mild tingling sensation but no significant pain  Impaired gait/imbalance stable since prior visit, denies any worsening.  Completed PT which was greatly beneficial and continues to do exercises at home.  He does try to be very cautious with walking especially on uneven ground.  Update 03/18/2022 JM: Patient returns for follow-up visit after prior visit with Dr. Rosemarie approximately 4 months ago.  Since prior visit, reports neuropathy continues to improve since correcting B12 deficiency.  Recent B12 level satisfactory, recently monitored by PCP.  Neuropathy panel checked at prior visit which showed abnormal M protein spike and referred to hematology for further evaluation, also noted elevated rheumatoid factor but as asymptomatic, further evaluation not pursued.    He has also been having issues with imbalance/unsteadiness.  Currently working with PT with continued improvement.  He will have imbalance at times with hypotension but rare occurrence is relatively well controlled on midodrine .  Also mentions worsening imbalance with vision fluctuation.  Reports seeing objects smaller from right eye with left eye closed which is not new.  Also mentions ongoing transient right eye altitudinal visual loss (previously discussed with Dr. Rosemarie) but has been improving.  He questions further evaluation with ophthalmologist.  No new concerns at this time.  Update 12/04/2021 Dr. Rosemarie He returns for follow-up after last visit 3 months ago.  Patient states has noticed improvement in the tingling numbness in his feet after starting vitamin B-12 replacement for a month.  His vitamin B12 was found to be low at 212.  TSH and hemoglobin A1c were normal.  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  Patient was  started on B12 replacement injections and now has been switched to tablets and has noticed a distinct improvement in the numbness which used to be constant every time he rested on laydown is now on intermittent and is not as bothersome.  He also is to have some mental fogging and lack of thought clarity which also seems to be improving.  He complains of occasional transient right altitudinal vision loss for 5 to 10 seconds of especially in his standing or sitting this may be related to his low blood pressure resume at baseline returns below.  Recently his blood pressure medications were reduced.  He has been started on midodrine  for orthostatic hypotension.  He also has some difficulty with activities like playing golf for he feels he is slightly off balance when when he is completing the swing he is not sure if he will fall.   Update 08/26/2021 JM: Returns for 43-month stroke follow-up.  Stable from stroke standpoint without new stroke/TIA symptoms.  Compliant on aspirin  and Crestor  -denies side effects.  Blood pressure today 154/82.  Continues to experience bilateral toe numbness as well as imbalance (discussed at prior visit). Denies worsening since prior  visit. B/l toe numbness/tingling worse at night but denies associated pain.  Has been taking B12 supplement. Continues on midodrine  2.5mg  TID which has been helping BP and preventing orthostatic hypotension. Feels like legs may be weaker since prior visit. Occasional vertigo - quick lasting.  Has been participating at [facility] routinely and playing golf.  No further concerns at this time.  Update 04/16/2021 JM: Mr. Levit returns for 45-month follow-up.  Stable from stroke standpoint without new stroke/TIA symptoms.  He did have 1 episode of vertigo back in April which subsided after performing Epley maneuver -no additional episodes since that time.  His greatest concern today is in regards to gradually worsening BLE numbness/tingling present at nighttime -denies  associated pain. PCP recently checked lab work (02/19/2021) with B12 212 and started on monthly injections with last injection 5/27.  He believes he may have had some benefit of symptoms after B12 injection.  Continues to follow with cardiology for symptomatic hypotension and started on midodrine  on 5/27 - he reports taking approx 6-7 pills since starting using more frequently in the first 2 weeks. He does have follow up with Dr. Fernande 7/7. BP has been more regulated where he has not been experiencing low blood pressure.  Blood pressure today 102/60.  Compliant on aspirin  and Crestor  without associated side effects.  No further concerns at this time.  Update 11/19/2020 JM: Mr. Ruffins returns for TIA follow-up as well as concerns of dizziness/vertigo unaccompanied.  Reports episode of vertigo 1 week ago where he felt as though he was getting pulled towards the right side and was able to lower himself to his knees as he felt as though he was going to fall.  This sensation lasted for 2 to 3 minutes and then subsided.  He laid down in his bed and took a nap for approximately 20 minutes with resolution of symptoms upon awakening.  Denies any other associated symptoms such as weakness, numbness/tingling, visual changes, speech changes, facial weakness or headaches.  He was evaluated by ENT for continued vertigo (unable to view via epic) - per patient, told left inner ear issue and referred to PT for vestibular rehab.  He has been working with neuro rehab PT with improvement of symptoms.  He is able to turn his head more without provoking symptoms and symptoms typically progress with quick head movements.  He did trial meclizine  x1 with increased fatigue without noticeable benefit.  Otherwise, he has been stable from a stroke standpoint.  Reports ongoing compliance with aspirin  81 mg daily and Crestor  20 mg daily and denies side effects.  Blood pressure today 127/77.  Previously having issues with hypotension therefore PCP  discontinued Maxzide  and blood pressure has been slowly stabilizing.  He will occasionally have low blood pressure still where he will feel lightheaded in 1 to 2 seconds OD visual change which are typical symptoms of low blood pressure for patient.  No further concerns at this time.  Initial visit 06/19/2020 Dr. Rosemarie: Mr. Escamilla is a 89 year old Caucasian male seen today for initial office follow-up visit following hospital consultation for TIA in July 2021.  History is obtained from the patient, review of electronic medical records and I personally reviewed available imaging films in PACS.  He has past medical history for hyperlipidemia, hypertension, peripheral arterial disease, COPD, bradycardia with history of syncope who presented to [facility] on [facility]/17/2020 with sudden onset of gait instability.  He felt suddenly off balance and was leaning to the right side and  slammed into a wall.  He denied any accompanying vertigo, room spinning, lightheadedness blurred vision no slurred speech or double vision.  This lasted about 5 minutes and then gradually returned back to normal.  He stated he had a previous episode 10 years ago of suddenly collapsing after getting up from the dining table at a restaurant and was felt to have had a vasovagal syncope at that time.  He does have history of peripheral arterial disease and underwent right femoral endarterectomy on 04/03/2020 with Dr. Harvey and left-sided endarterectomy is also planned.  MRI scan of the brain obtained in the hospital was negative for acute infarct and showed small changes of small vessel disease CT angiogram showed no significant extracranial intracranial large vessel stenosis or occlusion.  LDL cholesterol 57 mg percent and hemoglobin A1c was 5.7.  Transthoracic echo showed normal ejection fraction without cardiac source of embolism.  Outpatient cardiac Holter monitoring showed no significant cardiac arrhythmias.  Patient states he has had episodes  of hypotension which are manifested with blurred vision and feeling tired and fuzzy headed.  The has noticed this happen multiple times when his blood pressure has been below 100 systolic.  He does take Maxide about 2 or 3 days a week when his blood pressure is running in the 150s.  He seems to be exquisitely sensitive to this.  He plans to discuss with his primary care physician alternative blood pressure management strategies.  He admits to not drinking enough fluids.  He was scheduled to undergo dental surgery soon which she hopes will solve his problems.  He has had no recurrent stroke or TIA symptoms.  He remains on Crestor  20 mg he is tolerating well without muscle aches and pains.     ROS:   14 system review of systems is positive for those listed in HPI and all other systems negative  PMH:  Past Medical History:  Diagnosis Date   BRADYCARDIA 06/01/2007   Cataract    bil cataracts removed   COPD (chronic obstructive pulmonary disease) (HCC)    EMPHYSEMA 03/28/2008   Emphysema of lung (HCC)    GERD (gastroesophageal reflux disease)    HYPERGLYCEMIA 06/01/2007   HYPERLIPIDEMIA 06/01/2007   Hypertension    HYPERTENSION 01/19/2009   Peripheral arterial disease (HCC)    TIA (transient ischemic attack)     Social History:  Social History   Socioeconomic History   Marital status: Widowed    Spouse name: Not on file   Number of children: Not on file   Years of education: Not on file   Highest education level: Not on file  Occupational History   Occupation: Retired   Tobacco Use   Smoking status: Former    Current packs/day: 0.00    Average packs/day: 0.5 packs/day for 40.0 years (20.0 ttl pk-yrs)    Types: Cigarettes    Start date: 09/26/1977    Quit date: 09/26/2017    Years since quitting: 6.0   Smokeless tobacco: Never   Tobacco comments:    stopped 2018  Vaping Use   Vaping status: Never Used  Substance and Sexual Activity   Alcohol use: No   Drug use: No   Sexual  activity: Not on file  Other Topics Concern   Not on file  Social History Narrative   Lives alone    Right handed   Caffeine: drinks de-caf drinks, diet drinks if its a coke.      Retired    [employer]   Financial Resource Strain: Low Risk  (04/20/2023)   Overall Financial Resource Strain (CARDIA)    Difficulty of Paying Living Expenses: Not hard at all  Food Insecurity: No Food Insecurity (04/20/2023)   Hunger Vital Sign    Worried About Running Out of Food in the Last Year: Never true    Ran Out of Food in the Last Year: Never true  Transportation Needs: No Transportation Needs (04/20/2023)   PRAPARE - Administrator, Civil Service (Medical): No    Lack of Transportation (Non-Medical): No  Physical Activity: Insufficiently Active (04/20/2023)   Exercise Vital Sign    Days of Exercise per Week: 3 days    Minutes of Exercise per Session: 30 min  Stress: No Stress Concern Present (04/20/2023)   [employer] of Occupational Health - Occupational Stress Questionnaire    Feeling of Stress : Not at all  Social Connections: Moderately Isolated (04/20/2023)   Social Connection and Isolation Panel [NHANES]    Frequency of Communication with Friends and Family: More than three times a week    Frequency of Social Gatherings with Friends and Family: More than three times a week    Attends Religious Services: More than 4 times per year    Active Member of [employer] or Organizations: No    Attends Banker Meetings: Never    Marital Status: Widowed  Intimate Partner Violence: Not At Risk (04/20/2023)   Humiliation, Afraid, Rape, and Kick questionnaire    Fear of Current or Ex-Partner: No    Emotionally Abused: No    Physically Abused: No    Sexually Abused: No    Medications:   Current Outpatient Medications on File Prior to Visit  Medication Sig Dispense Refill   aspirin  81 MG tablet Take 81 mg by mouth daily.      cyanocobalamin  2000 MCG tablet Take  1,000 mcg by mouth as needed.     EPINEPHrine  (EPIPEN  2-PAK) 0.3 mg/0.3 mL IJ SOAJ injection Inject 0.3 mLs (0.3 mg total) into the muscle as needed for anaphylaxis. 1 each 0   fluticasone  (FLONASE ) 50 MCG/ACT nasal spray Place 2 sprays into both nostrils as needed for allergies or rhinitis.     fluticasone -salmeterol (WIXELA INHUB) 250-50 MCG/ACT AEPB Inhale 1 puff into the lungs in the morning and at bedtime. 60 each 11   midodrine  (PROAMATINE ) 5 MG tablet Take 1.5 tablets (7.5 mg total) by mouth every 4 (four) hours while awake. 540 tablet 3   rosuvastatin  (CRESTOR ) 20 MG tablet TAKE 1 TABLET(20 MG) BY MOUTH DAILY AT 6 PM 90 tablet 1   No current facility-administered medications on file prior to visit.    Allergies:   Allergies  Allergen Reactions   Bee Venom Anaphylaxis    Yellow jackets   Meclizine      Fatigue     Physical Exam Today's Vitals   10/22/23 1424  BP: 119/73  Pulse: (!) 58  Weight: 203 lb (92.1 kg)  Height: 5' 10 (1.778 m)   Body mass index is 29.13 kg/m.   General: well developed, well nourished pleasant elderly Caucasian male, seated, in no evident distress Head: head normocephalic and atraumatic.  Neck: supple with no carotid or supraclavicular bruits Cardiovascular: regular rate and rhythm, no murmurs Musculoskeletal: no deformity Skin:  no rash/petichiae Vascular:  Normal pulses all extremities  Neurologic Exam Mental Status: Awake and fully alert.  Fluent speech and language.  Oriented to place and time. Recent and remote  memory intact. Attention span, concentration and fund of knowledge appropriate. Mood and affect appropriate.  Cranial Nerves: Pupils equal, briskly reactive to light. Extraocular movements full without nystagmus. Visual fields full to confrontation. Hearing slightly diminished bilaterally. Facial sensation intact. Face, tongue, palate moves normally and symmetrically.  Motor: Normal bulk and tone. Normal strength in all tested  extremity muscles  Sensory.:  Decreased sensation BLE distally, intact upper extremities Coordination: Rapid alternating movements normal in all extremities. Finger-to-nose and heel-to-shin performed accurately bilaterally. Gait and Station: Arises from chair without difficulty. Stance is slightly hunched. Gait demonstrates broad-based gait with slightly decreased stride length bilaterally without use of assistive device.  Tandem walk and heel toe not attempted      ASSESSMENT/PLAN: 89 year old male with posterior circulation TIA in July 2021 from small vessel disease.  Vascular risk factors of hypertension hyperlipidemia, peripheral arterial disease, BPPV and age.  Longstanding history of BPPV which has been stable.  Evidence of severe sensory neuropathy per EMG/NCV 10/2021 with significant B12 deficiency and some improvement of symptoms after correction.  Continues to have transient right eye altitudinal vision loss or transient wavy lines/floaters lasting about 2 minutes about 3x per month, previously felt to be associated with hypotension but not recently.      TIA :  Unclear if recent transient vision disturbance in setting of TIA vs retinal migraine. Continue to monitor, advised to call if this should worsen. Continue to follow with ophthalmology Continue aspirin  81 mg daily  and Crestor  for secondary stroke prevention.   Carotid duplex and TCD 04/2023 normal Discussed avoidance of hypotension Discussed secondary stroke prevention measures and importance of close PCP f/u for aggressive stroke risk factor management   Orthostatic hypotension Stabilized after adequate water intake!! 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  Continue HEP as advised by therapy.  Discussed fall precautions     Follow-up in 6 months or call earlier if needed    CC:  Kennyth Worth HERO, MD   I spent 46 minutes of face-to-face and non-face-to-face time with patient.  This included previsit chart review, lab review, study review, order entry, electronic health record documentation, patient education and discussion regarding above diagnoses and treatment plan and answered all the questions to patient's satisfaction  Bryan Armstrong, [facility]  [facility] [address] [place], [place] [address]  Phone [phone] Fax [phone] Note: This document was prepared with digital dictation and possible smart phrase technology. Any transcriptional errors that result from this process are unintentional.

## 2023-10-22 ENCOUNTER — Ambulatory Visit: Payer: Medicare Other | Admitting: Adult Health

## 2023-10-22 ENCOUNTER — Encounter: Payer: Self-pay | Admitting: Adult Health

## 2023-10-22 VITALS — BP 119/73 | HR 58 | Ht 70.0 in | Wt 203.0 lb

## 2023-10-22 DIAGNOSIS — G629 Polyneuropathy, unspecified: Secondary | ICD-10-CM

## 2023-10-22 DIAGNOSIS — H5347 Heteronymous bilateral field defects: Secondary | ICD-10-CM | POA: Diagnosis not present

## 2023-10-22 DIAGNOSIS — G459 Transient cerebral ischemic attack, unspecified: Secondary | ICD-10-CM | POA: Diagnosis not present

## 2023-10-22 DIAGNOSIS — R2689 Other abnormalities of gait and mobility: Secondary | ICD-10-CM | POA: Diagnosis not present

## 2023-10-22 DIAGNOSIS — I951 Orthostatic hypotension: Secondary | ICD-10-CM

## 2023-10-22 NOTE — Patient Instructions (Addendum)
 Your Plan:  Continue aspirin  and Crestor  for secondary stroke prevention measures  Continue close PCP follow-up for aggressive stroke risk factor management  Continue to follow with cardiology for orthostatic hypotension and ongoing use of midodrine   Recommend restarting B12 supplement. Can also try over the counter magnesium  glycinate to help with cramping  Monitor your eye symptoms, if this should worsen (especially if not associated with low blood pressure), please let me know     Follow up in 6 months or call earlier if needed      Thank you for coming to see us  at [facility]. I hope we have been able to provide you high quality care today.  You may receive a patient satisfaction survey over the next few weeks. We would appreciate your feedback and comments so that we may continue to improve ourselves and the health of our patients.

## 2023-10-26 ENCOUNTER — Ambulatory Visit (HOSPITAL_COMMUNITY): Admit: 2023-10-26 | Payer: Medicare Other | Admitting: Orthopedic Surgery

## 2023-10-26 SURGERY — ARTHROPLASTY, KNEE, TOTAL
Anesthesia: Choice | Site: Knee | Laterality: Right

## 2023-11-02 ENCOUNTER — Other Ambulatory Visit: Payer: Self-pay | Admitting: Family Medicine

## 2023-11-17 ENCOUNTER — Ambulatory Visit: Payer: Medicare Other | Attending: Internal Medicine | Admitting: Internal Medicine

## 2023-11-17 ENCOUNTER — Encounter: Payer: Self-pay | Admitting: Internal Medicine

## 2023-11-17 VITALS — BP 108/70 | HR 56 | Ht 70.0 in | Wt 199.0 lb

## 2023-11-17 DIAGNOSIS — I951 Orthostatic hypotension: Secondary | ICD-10-CM | POA: Diagnosis not present

## 2023-11-17 DIAGNOSIS — D649 Anemia, unspecified: Secondary | ICD-10-CM | POA: Diagnosis not present

## 2023-11-17 NOTE — Patient Instructions (Signed)
Medication Instructions:  Your physician recommends that you continue on your current medications as directed. Please refer to the Current Medication list given to you today.  *If you need a refill on your cardiac medications before your next appointment, please call your pharmacy*  Follow-Up: At [facility], you and your health needs are our priority.  As part of our continuing mission to provide you with exceptional heart care, we have created designated Provider Care Teams.  These Care Teams include your primary Cardiologist (physician) and Advanced Practice Providers (APPs -  Physician Assistants and Nurse Practitioners) who all work together to provide you with the care you need, when you need it.  Your next appointment:   6 months  Provider:   You may see Sherryl Manges, MD or one of the following Advanced Practice Providers on your designated Care Team:   Francis Dowse, [place] [address]" [place], [place] Sherie Don, NP Canary Brim, NP

## 2023-11-17 NOTE — Progress Notes (Signed)
 Patient Care Team: Kennyth Worth HERO, MD as PCP - General (Family Medicine) Wonda Sharper, MD as PCP - Cardiology (Cardiology) Fernande Elspeth BROCKS, MD as PCP - Electrophysiology (Cardiology) Roise Vernita Larger, MD (Internal Medicine)   HPI  Bryan Armstrong is a 89 y.o. male Seen in follow-up for orthostatic hypotension in the setting of peripheral neuropathy and gait disturbance and remote right bundle branch block.  Currently treated with ProAmatine .  Hx of B12 deficiency with repletion   At the last visit, we increase his ProAmatine  to 2.5--5 3 times daily.  Significant interval improvement.  However, some worsening symptoms this summer,  fludrocortisone  added per Dr. [facility], at 0.1 mg daily.  Associated with fluid retention.    Ongoing issues with lightheadedness and some presyncope.  Much improved with augmented fluid intake now at 2 L a day and ProAmatine .  Undertook event recorder demonstrated nonsustained VT as well as nonsustained SVT unassociated with symptoms however   DATE TEST EF    10/12 Echo  60-65 %    7/21 Echo  60-65 %               Date Cr K Hgb  11/21 1.41 4.0 13.8  6/22 1.15 4.3 13.8 (5/22)  7/23 1.07 (8/23) 4.0 14.8   ]5/24 1.16 4.0 13.9  12/24 1.08 4.1 12.5      Records and Results Reviewed   Past Medical History:  Diagnosis Date   BRADYCARDIA 06/01/2007   Cataract    bil cataracts removed   COPD (chronic obstructive pulmonary disease) (HCC)    EMPHYSEMA 03/28/2008   Emphysema of lung (HCC)    GERD (gastroesophageal reflux disease)    HYPERGLYCEMIA 06/01/2007   HYPERLIPIDEMIA 06/01/2007   Hypertension    HYPERTENSION 01/19/2009   Peripheral arterial disease (HCC)    TIA (transient ischemic attack)     Past Surgical History:  Procedure Laterality Date   ABDOMINAL AORTOGRAM W/LOWER EXTREMITY N/A 03/05/2020   Procedure: ABDOMINAL AORTOGRAM W/  LOWER EXTREMITY;  Surgeon: Court Dorn PARAS, MD;  Location: [facility];  Service:  Cardiovascular;  Laterality: N/A;   CATARACT EXTRACTION, BILATERAL     COLONOSCOPY     ENDARTERECTOMY FEMORAL Right 04/03/2020   Procedure: Right Femoral Endarterectomy;  Surgeon: Harvey Carlin BRAVO, MD;  Location: [facility] OR;  Service: Vascular;  Laterality: Right;   ENDARTERECTOMY FEMORAL Left 09/10/2020   Procedure: ENDARTERECTOMY FEMORAL LEFT;  Surgeon: Harvey Carlin BRAVO, MD;  Location: [facility] OR;  Service: Vascular;  Laterality: Left;   EYE SURGERY     Bilaterl cataracts   PATCH ANGIOPLASTY Left 09/10/2020   Procedure: PATCH ANGIOPLASTY OF LEFT FEMORAL ARTERY USING HEMASHIELD PLATINUM FINESSE PATCH;  Surgeon: Harvey Carlin BRAVO, MD;  Location: [facility] OR;  Service: Vascular;  Laterality: Left;   POLYPECTOMY     TONSILLECTOMY AND ADENOIDECTOMY      Current Meds  Medication Sig   aspirin  81 MG tablet Take 81 mg by mouth daily.    cyanocobalamin  2000 MCG tablet Take 1,000 mcg by mouth as needed.   EPINEPHrine  (EPIPEN  2-PAK) 0.3 mg/0.3 mL IJ SOAJ injection Inject 0.3 mLs (0.3 mg total) into the muscle as needed for anaphylaxis.   fluticasone  (FLONASE ) 50 MCG/ACT nasal spray Place 2 sprays into both nostrils as needed for allergies or rhinitis.   fluticasone -salmeterol (WIXELA INHUB) 250-50 MCG/ACT AEPB Inhale 1 puff into the lungs in the morning and at bedtime.   midodrine  (PROAMATINE ) 5 MG tablet Take 1.5  tablets (7.5 mg total) by mouth every 4 (four) hours while awake.   rosuvastatin  (CRESTOR ) 20 MG tablet TAKE 1 TABLET(20 MG) BY MOUTH DAILY AT 6 PM    Allergies  Allergen Reactions   Bee Venom Anaphylaxis    Yellow jackets   Meclizine      Fatigue       Review of Systems negative except from HPI and PMH  Physical Exam BP 108/70 Comment: right arm  Pulse (!) 56 Comment: dizzy  Ht 5' 10 (1.778 m)   Wt 199 lb (90.3 kg)   SpO2 95%   BMI 28.55 kg/m  Well developed and well nourished in no acute distress HENT normal Neck supple with JVP-flat Clear  Regular rate and rhythm, no  gallop No   murmur Abd-soft with active BS No Clubbing cyanosis  edema Skin-warm and dry A & Oriented  Grossly normal sensory and motor function  ECG sinus at 59 Normal 21/15/44 Right bundle branch block No changes     CrCl cannot be calculated (Patient's most recent lab result is older than the maximum 21 days allowed.).   Assessment and  Plan  Orthostatic hypotension severe  Hypertension-systolic   Diagnosis of vertigo, vasovagal syncope, ataxia   Peripheral vascular disease with prior lower extremity revascularization   RBBB-old   Neuropathy     Patient has hypertension.  However, also with severe orthostatic hypotension we will not treat the systolic blood pressure pharmacologically; however, have advised him to raise the head of his bed for-6 inches to mitigate supine systolic hypertension.  It may also improve his morning symptoms of lightheadedness.  Long discussion and repeat measurements of right arm versus left arm blood pressure discrepancies.  On 2 different checks they were nearly identical to my examination.  1 check the systolic for about 170 on the other about 145 so some degree of lability also

## 2023-11-18 NOTE — Addendum Note (Signed)
 Addended by: Arva Lathe on: 11/18/2023 08:01 AM   Modules accepted: Orders

## 2023-11-19 ENCOUNTER — Telehealth: Payer: Self-pay

## 2023-11-19 NOTE — Telephone Encounter (Signed)
 Spoke with pt and advised per Dr Rodolfo Clan pt will need CBC.  Order has been placed and pt verbalizes understanding and thanked Charity fundraiser for the call.

## 2023-11-20 DIAGNOSIS — D649 Anemia, unspecified: Secondary | ICD-10-CM | POA: Diagnosis not present

## 2023-11-20 NOTE — Telephone Encounter (Signed)
 Pt requesting cb to discuss lab order and previous lab done-billing issues???

## 2023-11-20 NOTE — Telephone Encounter (Signed)
 Spoke with pt who reports he had lab completed today as requested.

## 2023-11-21 LAB — CBC
Hematocrit: 39.9 % (ref 37.5–51.0)
Hemoglobin: 14 g/dL (ref 13.0–17.7)
MCH: 30.4 pg (ref 26.6–33.0)
MCHC: 35.1 g/dL (ref 31.5–35.7)
MCV: 87 fL (ref 79–97)
Platelets: 206 10*3/uL (ref 150–450)
RBC: 4.6 x10E6/uL (ref 4.14–5.80)
RDW: 13.5 % (ref 11.6–15.4)
WBC: 7.4 10*3/uL (ref 3.4–10.8)

## 2023-12-07 ENCOUNTER — Telehealth: Payer: Self-pay | Admitting: Internal Medicine

## 2023-12-07 ENCOUNTER — Telehealth: Payer: Self-pay | Admitting: Adult Health

## 2023-12-07 NOTE — Telephone Encounter (Signed)
 Patient is requesting to speak with Mindi Junker, RN, to discuss hemoglobin results if possible.

## 2023-12-07 NOTE — Telephone Encounter (Signed)
 Pt is asking for a call to discuss with RN a change he has noticed within recent weeks that his energy level has decreased and wants to discuss the neuropathy in his legs.

## 2023-12-07 NOTE — Telephone Encounter (Signed)
 Spoke with pt and advised of normal lab result per Dr Graciela Husbands.  Pt verbalizes understanding and thanked Charity fundraiser for the call.

## 2023-12-08 NOTE — Telephone Encounter (Signed)
 Called the pt back. He has noticed increased in bilateral lower extremity weakness. He also has noticed energy level has decreased as well. Cardiology checked hemoglobin and that looked good. 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 Advised he should contact PCP about the increase fatigue decrease energy levels in case there are any other blood work that they may want to order for him. Pt was appreciative for the call back.

## 2023-12-16 ENCOUNTER — Encounter (INDEPENDENT_AMBULATORY_CARE_PROVIDER_SITE_OTHER): Payer: Medicare Other | Admitting: Ophthalmology

## 2023-12-16 DIAGNOSIS — H43813 Vitreous degeneration, bilateral: Secondary | ICD-10-CM | POA: Diagnosis not present

## 2023-12-16 DIAGNOSIS — H353111 Nonexudative age-related macular degeneration, right eye, early dry stage: Secondary | ICD-10-CM | POA: Diagnosis not present

## 2023-12-16 DIAGNOSIS — I10 Essential (primary) hypertension: Secondary | ICD-10-CM | POA: Diagnosis not present

## 2023-12-16 DIAGNOSIS — H35372 Puckering of macula, left eye: Secondary | ICD-10-CM

## 2023-12-16 DIAGNOSIS — H35033 Hypertensive retinopathy, bilateral: Secondary | ICD-10-CM | POA: Diagnosis not present

## 2023-12-16 DIAGNOSIS — H353122 Nonexudative age-related macular degeneration, left eye, intermediate dry stage: Secondary | ICD-10-CM

## 2023-12-22 DIAGNOSIS — M25561 Pain in right knee: Secondary | ICD-10-CM | POA: Diagnosis not present

## 2023-12-22 DIAGNOSIS — M1711 Unilateral primary osteoarthritis, right knee: Secondary | ICD-10-CM | POA: Diagnosis not present

## 2023-12-22 DIAGNOSIS — M25661 Stiffness of right knee, not elsewhere classified: Secondary | ICD-10-CM | POA: Diagnosis not present

## 2023-12-22 DIAGNOSIS — R262 Difficulty in walking, not elsewhere classified: Secondary | ICD-10-CM | POA: Diagnosis not present

## 2024-02-05 ENCOUNTER — Other Ambulatory Visit: Payer: Self-pay | Admitting: Internal Medicine

## 2024-02-05 ENCOUNTER — Other Ambulatory Visit: Payer: Self-pay | Admitting: Family Medicine

## 2024-02-05 NOTE — Telephone Encounter (Signed)
 Copied from CRM [id number]. Topic: Clinical - Medication Refill >> Feb 05, 2024 12:43 PM Elita Guitar wrote: Most Recent Primary Care Visit:  Provider: Rodney Clamp  Department: [facility]  Visit Type: OFFICE VISIT  Date: 08/13/2023  Medication: rosuvastatin   Has the patient contacted their pharmacy? Yes No more refills.  Is this the correct pharmacy for this prescription? Yes  This is the patient's preferred pharmacy:  [id number] [place], [place] - [address] AT [facility] [address] [facility] [address] [facility] [address] [place] [address] Phone: [phone] Fax: [phone]  Has the prescription been filled recently? Yes  Is the patient out of the medication? No  Has the patient been seen for an appointment in the last year OR does the patient have an upcoming appointment? No  Can we respond through MyChart? No  Agent: Please be advised that Rx refills may take up to 3 business days. We ask that you follow-up with your pharmacy.

## 2024-02-08 MED ORDER — ROSUVASTATIN CALCIUM 20 MG PO TABS: ORAL_TABLET | ORAL | 0 refills | Status: AC

## 2024-02-09 ENCOUNTER — Telehealth: Payer: Self-pay | Admitting: Internal Medicine

## 2024-02-09 MED ORDER — MIDODRINE HCL 5 MG PO TABS: 5.0000 mg | ORAL_TABLET | Freq: Three times a day (TID) | ORAL | 1 refills | Status: AC

## 2024-02-09 NOTE — Telephone Encounter (Signed)
 Unable to leave voicemail. Voicemail is not set up

## 2024-02-09 NOTE — Telephone Encounter (Signed)
 Pt returned call- ID X 2. Informed him that I sent in a new prescription for Midodrine  at 01:30. He reports that when he went to pick it up, they said it costs him over $800. He reports that he has taken this medication before.  Informed him that I am not sure why it is so expensive. Instructed him to call the pharmacy and see if they know why it is so expensive; then he may need to call his insurance company as well. He verbalized understanding.

## 2024-02-09 NOTE — Telephone Encounter (Signed)
 Patient returned RN's call.

## 2024-02-09 NOTE — Telephone Encounter (Signed)
 Called to clarify what they are having difficulty with- is it insurance or the pharmacy?. Left message and call back number.   Prescription refill sent to [facility]

## 2024-02-09 NOTE — Telephone Encounter (Signed)
 Pt c/o medication issue:  1. Name of Medication: midodrine  (PROAMATINE ) 5 MG tablet   2. How are you currently taking this medication (dosage and times per day)?    3. Are you having a reaction (difficulty breathing--STAT)? no  4. What is your medication issue? Patient states he is having trouble with getting this medication. Please advise

## 2024-03-28 ENCOUNTER — Ambulatory Visit: Admitting: Podiatry

## 2024-04-04 ENCOUNTER — Ambulatory Visit: Admitting: Podiatry

## 2024-04-25 ENCOUNTER — Ambulatory Visit: Admitting: Podiatry

## 2024-04-25 VITALS — Ht 70.0 in | Wt 199.0 lb

## 2024-04-25 DIAGNOSIS — M79674 Pain in right toe(s): Secondary | ICD-10-CM

## 2024-04-25 DIAGNOSIS — B351 Tinea unguium: Secondary | ICD-10-CM

## 2024-04-25 DIAGNOSIS — G629 Polyneuropathy, unspecified: Secondary | ICD-10-CM

## 2024-04-25 NOTE — Patient Instructions (Signed)
 You can use UREA NAIL GEL on the thicker toenail to help thin it and soften it if needed

## 2024-04-25 NOTE — Progress Notes (Unsigned)
  Subjective:  Patient ID: Bryan Armstrong, male    DOB: 08/07/1935,  MRN: [id number]  Chief Complaint  Patient presents with   Nail Problem    Rm 11 Patient is here for right hallux. Right hallux is thickened, discolored and deformed in shape.    Discussed the use of AI scribe software for clinical note transcription with the patient, who gave verbal consent to proceed.  History of Present Illness Bryan Armstrong is an 89 year old male who presents with issues related to his right big toenail.  The right big toenail has been growing abnormally for six weeks to two months, making it difficult to trim and uncomfortable to wear socks. The toenail is thick and misshapen, but there is no associated pain. The left foot is unaffected.  He takes aspirin  three times a week and does not have diabetes. Neuropathy in his legs and feet affects his balance.  He has knee arthritis, treated with gel injections effective for six months.      Objective:    Physical Exam General: AAO x3, NAD  Dermatological: Right hallux toenails hypertrophic, dystrophic with yellow, brown discoloration.  Is curving hitting the second toe.  There is no edema, erythema or any signs of infection.  There are no open lesions.  Vascular: Dorsalis Pedis artery and Posterior Tibial artery pedal pulses are 2/4 bilateral with immedate capillary fill time.  There is no pain with calf compression, swelling, warmth, erythema.   Neruologic: Grossly intact via light touch bilateral.   Musculoskeletal: Gets tenderness the right hallux toenail as it grows out no other areas of discomfort.  Muscular strength 5/5 in all groups tested bilateral.  Gait: Unassisted, Nonantalgic.     No images are attached to the encounter.    Results Procedure: Toenail trimming and filing Description: The toenail was trimmed and filed to reduce thickness.   Assessment:   1. Dermatophytosis of nail   2. Pain in toe of right foot       Plan:  Patient was evaluated and treated and all questions answered.  Assessment and Plan Assessment & Plan Onychogryphosis of right big toenail Thickened, curved nail due to possible fungal infection or trauma. No pain or infection. - Separated.  Nail x 1 without any complications or bleeding. - Advise regular trimming and filing. - Offer assistance with nail care if needed. - Consider nail removal needed.  Peripheral neuropathy Affects legs and feet, primarily impacting balance. Previous physical therapy provided limited benefit.  Monitor. - Daily foot inspection.  Return if symptoms worsen or fail to improve.   Donnice JONELLE Fees DPM

## 2024-04-26 ENCOUNTER — Ambulatory Visit (INDEPENDENT_AMBULATORY_CARE_PROVIDER_SITE_OTHER): Payer: Medicare Other

## 2024-04-26 VITALS — Ht 70.0 in | Wt 199.0 lb

## 2024-04-26 DIAGNOSIS — Z0000 Encounter for general adult medical examination without abnormal findings: Secondary | ICD-10-CM

## 2024-04-26 NOTE — Progress Notes (Signed)
 I have personally reviewed the Medicare Annual Wellness Visit and agree with the documentation.  Worth HERO. Kennyth, MD 04/26/2024 11:26 AM

## 2024-04-26 NOTE — Progress Notes (Signed)
 Subjective:   Bryan Armstrong is a 89 y.o. who presents for a Medicare Wellness preventive visit.  As a reminder, Annual Wellness Visits don't include a physical exam, and some assessments may be limited, especially if this visit is performed virtually. We may recommend an in-person follow-up visit with your provider if needed.  Visit Complete: Virtual I connected with  Bryan Armstrong on 04/26/24 by a audio enabled telemedicine application and verified that I am speaking with the correct person using two identifiers.  Patient Location: Home  Provider Location: Office/Clinic  I discussed the limitations of evaluation and management by telemedicine. The patient expressed understanding and agreed to proceed.  Vital Signs: Because this visit was a virtual/telehealth visit, some criteria may be missing or patient reported. Any vitals not documented were not able to be obtained and vitals that have been documented are patient reported.  VideoDeclined- This patient declined Librarian, academic. Therefore the visit was completed with audio only.  Persons Participating in Visit: Patient.  AWV Questionnaire: No: Patient Medicare AWV questionnaire was not completed prior to this visit.  Cardiac Risk Factors include: advanced age (>31men, >57 women)     Objective:    Today's Vitals   04/26/24 1054  Weight: 199 lb (90.3 kg)  Height: 5' 10 (1.778 m)   Body mass index is 28.55 kg/m.     04/26/2024   10:58 AM 09/21/2023    2:38 PM 04/22/2023    2:49 PM 04/20/2023    4:11 PM 09/29/2022    2:19 PM 05/05/2022    1:40 PM 02/05/2022    1:14 PM  Advanced Directives  Does Patient Have a Medical Advance Directive? Yes Yes Yes Yes Yes No No  Type of Estate agent of [place];Living will Healthcare Power of [place];Living will Healthcare Power of [place];Living will Healthcare Power of [place];Living will Healthcare Power of [place];Living will     Does patient want to make changes to medical advance directive?  No - Patient declined   No - Patient declined    Copy of Healthcare Power of Attorney in Chart? No - copy requested No - copy requested No - copy requested No - copy requested No - copy requested    Would patient like information on creating a medical advance directive?      No - Patient declined No - Patient declined    Current Medications (verified) Outpatient Encounter Medications as of 04/26/2024  Medication Sig   aspirin  81 MG tablet Take 81 mg by mouth daily.    cyanocobalamin  2000 MCG tablet Take 1,000 mcg by mouth as needed.   fluticasone  (FLONASE ) 50 MCG/ACT nasal spray Place 2 sprays into both nostrils as needed for allergies or rhinitis.   fluticasone -salmeterol (WIXELA INHUB) 250-50 MCG/ACT AEPB Inhale 1 puff into the lungs in the morning and at bedtime.   midodrine  (PROAMATINE ) 5 MG tablet Take 1 tablet (5 mg total) by mouth 3 (three) times daily with meals.   rosuvastatin  (CRESTOR ) 20 MG tablet TAKE 1 TABLET(20 MG) BY MOUTH DAILY AT 6 PM   EPINEPHrine  (EPIPEN  2-PAK) 0.3 mg/0.3 mL IJ SOAJ injection Inject 0.3 mLs (0.3 mg total) into the muscle as needed for anaphylaxis. (Patient not taking: Reported on 04/26/2024)   No facility-administered encounter medications on file as of 04/26/2024.    Allergies (verified) Bee venom and Meclizine    History: Past Medical History:  Diagnosis Date   BRADYCARDIA 06/01/2007   Cataract    bil cataracts  removed   COPD (chronic obstructive pulmonary disease) (HCC)    EMPHYSEMA 03/28/2008   Emphysema of lung (HCC)    GERD (gastroesophageal reflux disease)    HYPERGLYCEMIA 06/01/2007   HYPERLIPIDEMIA 06/01/2007   Hypertension    HYPERTENSION 01/19/2009   Peripheral arterial disease (HCC)    TIA (transient ischemic attack)    Past Surgical History:  Procedure Laterality Date   ABDOMINAL AORTOGRAM W/LOWER EXTREMITY N/A 03/05/2020   Procedure: ABDOMINAL AORTOGRAM W/  LOWER  EXTREMITY;  Surgeon: Court Dorn PARAS, MD;  Location: [facility]ASIVE [facility];  Service: Cardiovascular;  Laterality: N/A;   CATARACT EXTRACTION, BILATERAL     COLONOSCOPY     ENDARTERECTOMY FEMORAL Right 04/03/2020   Procedure: Right Femoral Endarterectomy;  Surgeon: Harvey Carlin BRAVO, MD;  Location: [facility] OR;  Service: Vascular;  Laterality: Right;   ENDARTERECTOMY FEMORAL Left 09/10/2020   Procedure: ENDARTERECTOMY FEMORAL LEFT;  Surgeon: Harvey Carlin BRAVO, MD;  Location: [facility] OR;  Service: Vascular;  Laterality: Left;   EYE SURGERY     Bilaterl cataracts   PATCH ANGIOPLASTY Left 09/10/2020   Procedure: PATCH ANGIOPLASTY OF LEFT FEMORAL ARTERY USING HEMASHIELD PLATINUM FINESSE PATCH;  Surgeon: Harvey Carlin BRAVO, MD;  Location: [facility] OR;  Service: Vascular;  Laterality: Left;   POLYPECTOMY     TONSILLECTOMY AND ADENOIDECTOMY     Family History  Problem Relation Age of Onset   Cancer Mother        Male cancer   Uterine cancer Mother    Heart disease Father    Colon cancer Neg Hx    Esophageal cancer Neg Hx    Rectal cancer Neg Hx    Stomach cancer Neg Hx    Liver cancer Neg Hx    Pancreatic cancer Neg Hx    Prostate cancer Neg Hx    Stroke Neg Hx    Social History   Socioeconomic History   Marital status: Widowed    Spouse name: Not on file   Number of children: Not on file   Years of education: Not on file   Highest education level: Not on file  Occupational History   Occupation: Retired   Tobacco Use   Smoking status: Former    Current packs/day: 0.00    Average packs/day: 0.5 packs/day for 40.0 years (20.0 ttl pk-yrs)    Types: Cigarettes    Start date: 09/26/1977    Quit date: 09/26/2017    Years since quitting: 6.5   Smokeless tobacco: Never   Tobacco comments:    stopped 2018  Vaping Use   Vaping status: Never Used  Substance and Sexual Activity   Alcohol use: No   Drug use: No   Sexual activity: Not on file  Other Topics Concern   Not on file  Social History  Narrative   Lives alone    Right handed   Caffeine: drinks de-caf drinks, diet drinks if its a coke.      Retired    Teacher, early years/pre Strain: Low Risk  (04/26/2024)   Overall Financial Resource Strain (CARDIA)    Difficulty of Paying Living Expenses: Not hard at all  Food Insecurity: No Food Insecurity (04/26/2024)   Hunger Vital Sign    Worried About Running Out of Food in the Last Year: Never true    Ran Out of Food in the Last Year: Never true  Transportation Needs: No Transportation Needs (04/26/2024)   PRAPARE - Transportation    Lack  of Transportation (Medical): No    Lack of Transportation (Non-Medical): No  Physical Activity: Insufficiently Active (04/26/2024)   Exercise Vital Sign    Days of Exercise per Week: 2 days    Minutes of Exercise per Session: 30 min  Stress: No Stress Concern Present (04/26/2024)   [employer] of Occupational Health - Occupational Stress Questionnaire    Feeling of Stress: Not at all  Social Connections: Moderately Isolated (04/26/2024)   Social Connection and Isolation Panel    Frequency of Communication with Friends and Family: More than three times a week    Frequency of Social Gatherings with Friends and Family: More than three times a week    Attends Religious Services: More than 4 times per year    Active Member of [employer] or Organizations: No    Attends Banker Meetings: Never    Marital Status: Widowed    Tobacco Counseling Counseling given: Not Answered Tobacco comments: stopped 2018    Clinical Intake:  Pre-visit preparation completed: Yes  Pain : No/denies pain     BMI - recorded: 28.55 Nutritional Status: BMI 25 -29 Overweight Nutritional Risks: None Diabetes: No  Lab Results  Component Value Date   HGBA1C 5.7 02/27/2023   HGBA1C 5.7 02/24/2022   HGBA1C 5.9 02/19/2021     How often do you need to have someone help you when you read instructions, pamphlets, or other  written materials from your doctor or pharmacy?: 1 - Never  Interpreter Needed?: No  Information entered by :: Ellouise Haws, LPN   Activities of Daily Living     04/26/2024   11:02 AM  In your present state of health, do you have any difficulty performing the following activities:  Hearing? 0  Vision? 0  Difficulty concentrating or making decisions? 0  Walking or climbing stairs? 0  Dressing or bathing? 0  Doing errands, shopping? 0  Preparing Food and eating ? N  Using the Toilet? N  In the past six months, have you accidently leaked urine? N  Do you have problems with loss of bowel control? N  Managing your Medications? N  Managing your Finances? N  Housekeeping or managing your Housekeeping? N    Patient Care Team: Kennyth Worth HERO, MD as PCP - General (Family Medicine) Wonda Sharper, MD as PCP - Cardiology (Cardiology) Fernande Elspeth BROCKS, MD as PCP - Electrophysiology (Cardiology) Roise, Vernita Larger, MD (Internal Medicine)  I have updated your Care Teams any recent Medical Services you may have received from other providers in the past year.     Assessment:   This is a routine wellness examination for Marico.  Hearing/Vision screen Hearing Screening - Comments:: Pt denies any hearing issues  Vision Screening - Comments:: Wears rx glasses - up to date with routine eye exams with Male provider [place]    Goals Addressed             This Visit's Progress    Patient Stated       Keep working on knees        Depression Screen     04/26/2024   10:59 AM 07/23/2023    2:27 PM 04/20/2023    4:10 PM 02/27/2023    1:33 PM 12/25/2022    1:25 PM 02/03/2022   11:13 AM 02/19/2021    2:18 PM  PHQ 2/9 Scores  PHQ - 2 Score 0 0 0 0 0 0 0    Fall Risk  04/26/2024   11:00 AM 07/23/2023    2:27 PM 04/20/2023    4:12 PM 02/27/2023    1:34 PM 12/25/2022    1:25 PM  Fall Risk   Falls in the past year? 0 0 0 0 0  Number falls in past yr: 0 0 0 0 0  Injury with  Fall? 0 0 0 0 0  Risk for fall due to : Impaired balance/gait;Impaired mobility No Fall Risks Impaired vision No Fall Risks No Fall Risks  Risk for fall due to: Comment HX of vertigo      Follow up Falls prevention discussed  Falls prevention discussed      MEDICARE RISK AT HOME:  Medicare Risk at Home Any stairs in or around the home?: Yes If so, are there any without handrails?: No Home free of loose throw rugs in walkways, pet beds, electrical cords, etc?: Yes Adequate lighting in your home to reduce risk of falls?: Yes Life alert?: No Use of a cane, walker or w/c?: No Grab bars in the bathroom?: Yes Shower chair or bench in shower?: Yes Elevated toilet seat or a handicapped toilet?: No  TIMED UP AND GO:  Was the test performed?  No  Cognitive Function: 6CIT completed        04/26/2024   11:01 AM 04/20/2023    4:13 PM  6CIT Screen  What Year? 0 points 0 points  What month? 0 points 0 points  What time? 0 points 0 points  Count back from 20 0 points 0 points  Months in reverse 0 points 0 points  Repeat phrase 0 points 0 points  Total Score 0 points 0 points    Immunizations Immunization History  Administered Date(s) Administered   Fluad Quad(high Dose 65+) 07/23/2020, 08/11/2022   Influenza Inj Mdck Quad Pf 07/19/2019   Influenza Split 07/11/2016, 07/13/2022, 07/14/2023   Influenza Whole 08/15/2002, 09/21/2007, 08/03/2008, 08/08/2009, 08/01/2010   Influenza, High Dose Seasonal PF 06/20/2014, 08/30/2018   Influenza,inj,Quad PF,6+ Mos 06/28/2013, 08/28/2015   Influenza-Unspecified 08/14/2011, 07/18/2017, 07/19/2019   PFIZER(Purple Top)SARS-COV-2 Vaccination 11/03/2019, 11/24/2019, 07/23/2020   PNEUMOCOCCAL CONJUGATE-20 03/26/2022   Pneumococcal Conjugate-13 10/23/2015   Pneumococcal Polysaccharide-23 10/14/2002   Td 07/13/2001   Tdap 07/14/2011   Unspecified SARS-COV-2 Vaccination 04/29/2021, 08/11/2022, 07/14/2023   Zoster Recombinant(Shingrix) 08/12/2021,  10/16/2021   Zoster, Live 05/29/2015, 06/14/2015    Screening Tests Health Maintenance  Topic Date Due   DTaP/Tdap/Td (3 - Td or Tdap) 07/13/2021   COVID-19 Vaccine (7 - Pfizer risk 2024-25 season) 01/12/2024   INFLUENZA VACCINE  05/13/2024   Medicare Annual Wellness (AWV)  04/26/2025   Pneumococcal Vaccine: 50+ Years  Completed   Zoster Vaccines- Shingrix  Completed   Hepatitis B Vaccines  Aged Out   HPV VACCINES  Aged Out   Meningococcal B Vaccine  Aged Out   Colonoscopy  Discontinued    Health Maintenance  Health Maintenance Due  Topic Date Due   DTaP/Tdap/Td (3 - Td or Tdap) 07/13/2021   COVID-19 Vaccine (7 - Pfizer risk 2024-25 season) 01/12/2024   Health Maintenance Items Addressed: See Nurse Notes at the end of this note  Additional Screening:  Vision Screening: Recommended annual ophthalmology exams for early detection of glaucoma and other disorders of the eye. Would you like a referral to an eye doctor? No    Dental Screening: Recommended annual dental exams for proper oral hygiene  Community Resource Referral / Chronic Care Management: CRR required this visit?  No   CCM  required this visit?  No   Plan:    I have personally reviewed and noted the following in the patient's chart:   Medical and social history Use of alcohol, tobacco or illicit drugs  Current medications and supplements including opioid prescriptions. Patient is not currently taking opioid prescriptions. Functional ability and status Nutritional status Physical activity Advanced directives List of other physicians Hospitalizations, surgeries, and ER visits in previous 12 months Vitals Screenings to include cognitive, depression, and falls Referrals and appointments  In addition, I have reviewed and discussed with patient certain preventive protocols, quality metrics, and best practice recommendations. A written personalized care plan for preventive services as well as general  preventive health recommendations were provided to patient.   Ellouise VEAR Haws, LPN   [id number]/84/[id number]   After Visit Summary: (MyChart) Due to this being a telephonic visit, the after visit summary with patients personalized plan was offered to patient via MyChart   Notes: Nothing significant to report at this time.

## 2024-04-26 NOTE — Patient Instructions (Addendum)
 [name] , Thank you for taking time out of your busy schedule to complete your Annual Wellness Visit with me. I enjoyed our conversation and look forward to speaking with you again next year. I, as well as your care team,  appreciate your ongoing commitment to your health goals. Please review the following plan we discussed and let me know if I can assist you in the future. Your Game plan/ To Do List    Referrals: If you haven't heard from the office you've been referred to, please reach out to them at the phone provided.   Follow up Visits: Next Medicare AWV with our clinical staff: 05/04/25   Have you seen your provider in the last 6 months (3 months if uncontrolled diabetes)? No Next Office Visit with your provider: 06/24/24  Clinician Recommendations:  Aim for 30 minutes of exercise or brisk walking, 6-8 glasses of water, and 5 servings of fruits and vegetables each day.       This is a list of the screening recommended for you and due dates:  Health Maintenance  Topic Date Due   DTaP/Tdap/Td vaccine (3 - Td or Tdap) 07/13/2021   COVID-19 Vaccine (7 - Pfizer risk 2024-25 season) 01/12/2024   Medicare Annual Wellness Visit  04/19/2024   Flu Shot  05/13/2024   Pneumococcal Vaccine for age over 53  Completed   Zoster (Shingles) Vaccine  Completed   Hepatitis B Vaccine  Aged Out   HPV Vaccine  Aged Out   Meningitis B Vaccine  Aged Out   Colon Cancer Screening  Discontinued    Advanced directives: (Copy Requested) Please bring a copy of your health care power of attorney and living will to the office to be added to your chart at your convenience. You can mail to [facility] [address] [place], [place] [address] or email to [email or web address] Advance Care Planning is important because it:  [x]  Makes sure you receive the medical care that is consistent with your values, goals, and preferences  [x]  It provides guidance to your family and loved ones and  reduces their decisional burden about whether or not they are making the right decisions based on your wishes.  Follow the link provided in your after visit summary or read over the paperwork we have mailed to you to help you started getting your Advance Directives in place. If you need assistance in completing these, please reach out to us  so that we can help you!  See attachments for Preventive Care and Fall Prevention Tips.

## 2024-05-04 ENCOUNTER — Ambulatory Visit: Payer: Medicare Other | Admitting: Adult Health

## 2024-05-04 ENCOUNTER — Encounter: Payer: Self-pay | Admitting: Adult Health

## 2024-05-04 VITALS — BP 123/75 | HR 65 | Ht 71.0 in | Wt 201.0 lb

## 2024-05-04 DIAGNOSIS — E559 Vitamin D deficiency, unspecified: Secondary | ICD-10-CM | POA: Diagnosis not present

## 2024-05-04 DIAGNOSIS — G43109 Migraine with aura, not intractable, without status migrainosus: Secondary | ICD-10-CM

## 2024-05-04 DIAGNOSIS — G459 Transient cerebral ischemic attack, unspecified: Secondary | ICD-10-CM | POA: Diagnosis not present

## 2024-05-04 DIAGNOSIS — R2689 Other abnormalities of gait and mobility: Secondary | ICD-10-CM

## 2024-05-04 DIAGNOSIS — G629 Polyneuropathy, unspecified: Secondary | ICD-10-CM

## 2024-05-04 DIAGNOSIS — E538 Deficiency of other specified B group vitamins: Secondary | ICD-10-CM | POA: Diagnosis not present

## 2024-05-04 NOTE — Progress Notes (Signed)
 [facility] [address] [place]. [place] [address] [phone]       OFFICE FOLLOW-UP NOTE  Bryan Armstrong Date of Birth:  Feb 25, 1935 Medical Record Number:  [id number]    Primary neurologist: Dr. Rosemarie Reason for visit: hx of TIA, neuropathy, gait impairment   Chief Complaint  Patient presents with   Transient Ischemic Attack    Rm 8 alone Pt is well, reports no new TIA concerns.  Bryan Armstrong is still having imbalance and impaired gait.  No other concerns.       HPI:   Update 05/04/2024 JM: Patient returns for 9-month follow-up visit unaccompanied.   No new stroke/TIA symptoms. Bryan Armstrong does continue to have occasional right eye wavy vision or upper vision loss which only lasts up to 1 minute and then resolves.  No other associated symptoms.  Symptoms occur sporadically, previously occurred with hypotension but now that blood pressure has been stable, reports vision symptoms occurring less frequently. Usually once every few months.  Bryan Armstrong continues to ensure adequate water intake to avoid hypotension.  Remains on aspirin  and Crestor  without side effects.  Routinely follows with PCP for stroke risk factor management as well as routine follow-up with cardiology.   Bryan Armstrong continues to have difficulty with his balance which Bryan Armstrong attributes to his neuropathy and right knee pain.  Bryan Armstrong is currently receiving injections for right knee which has been beneficial and has follow-up visit with orthopedics tomorrow.  Bryan Armstrong has started on OTC Nervive Nerve Relief which Bryan Armstrong feels has been beneficial.  Bryan Armstrong denies any pain, more so just numbness. Bryan Armstrong is not currently taking additional vitamin B12 supplement. Last B12 level was last May. Bryan Armstrong does complain of decreased energy levels but Bryan Armstrong feels this is due to not being as active.  Thankfully, Bryan Armstrong has not had any recent falls.     History provided for reference purposes only Update 10/22/2023 JM: Patient returns for follow-up visit unaccompanied.  Bryan Armstrong  continues to have occasional right eye upper vision loss, occurs about 3 times per month, lasts for about 2 minutes. Bryan Armstrong can also at times experience right eye outer periphery wavy lines/fluttering in vision which can last a couple of minutes. Not associated with headache. No pain. No other symptoms.  Previously, Bryan Armstrong would experience the symptoms with low blood pressure but more recently, his blood pressure has been stable when this occurs.  Reports fainting episode beginning of December which led to a fall, was seen by EMS but did not proceed to ED, saw cardiology that day, Bryan Armstrong noted Bryan Armstrong self discontinued Florinef  as no benefit with every other day and taking daily because edema. Bryan Armstrong has since been working on water intake (was not drinking any water previously), Bryan Armstrong has been drinking 4x 12 oz waters per day, Bryan Armstrong has noticed great improvement in blood pressure which has been more stable and overall feels better. Bryan Armstrong no longer takes midodrine  3 times daily as Bryan Armstrong started to have elevated blood pressure, has only used midodrine  3 times since beginning of December. Has f/u visit with Dr. Fernande next month.  Reports compliance on aspirin  and Crestor .  Had follow-up with PCP in October, scheduled follow-up visit in July.  Bryan Armstrong continues to have bilateral peripheral neuropathy in his feet. More bothersome at night but does not interfere with sleep. At times, his toes can curl while laying in bed but improves after stretching. Denies any worsening of paresthesias. Does believe this has affected his overall balance and gait. Continues to  do exercises as advised by PT.  Bryan Armstrong is no longer taking B12 supplement, last B12 check satisfactory in 02/2023.   04/08/2023 Dr. Rosemarie: Bryan Armstrong returns for follow-up after last visit 6 months ago with [name].  Bryan Armstrong continues to have intermittent transient episodes of dizziness, lightheadedness with right eye altitudinal vision loss in the lower occurring once a month or so.  These  episodes last 5 to 7 minutes.  There is no clear triggers but Bryan Armstrong feels his blood pressure is low.  Bryan Armstrong takes Florinef  0.1 tablet daily as well as midodrine  up to 4 times a day.  Lab work done on 02/27/2023 vitamin B12 06 20.  LDL cholesterol 59 mg remains tolerating well with minor bruising and no bleeding.  Bryan Armstrong is tolerating Crestor  well without muscle aches and pains.  Bryan Armstrong has not had any brain imaging or Doppler studies done for more than 2 years   09/24/2022 JM: Patient returns for 14-month follow-up unaccompanied.    Back in July, episode of transient right eyelid drooping and bilateral fingertip numbness, was seen in ED, MRI negative for acute stroke.  Mention possibly due to low blood pressure.  Cardiology added Florinef  in addition to midodrine  for symptomatic hypotension, continues to be closely followed by cardiology Dr. Fernande and Dr. Wonda.  Denies any new stroke/TIA symptoms since that time.  Reports stabilization of blood pressure over the past couple of weeks weeks. Does report occasional fluctuation of right vision. Feels like his right eye will flutter and that's how Bryan Armstrong knows his blood pressure is low. Bryan Armstrong was seen by Dr. Octavia back in July with extensive vision exam without any concerning findings. Bryan Armstrong plans on scheduling a f/u visit to further discuss continued vision issues.   Feels like neuropathy symptoms have leveled off and possibly improved especially since starting B12 oral supplement. Does have persistent bilateral foot mild tingling sensation but no significant pain  Impaired gait/imbalance stable since prior visit, denies any worsening.  Completed PT which was greatly beneficial and continues to do exercises at home.  Bryan Armstrong does try to be very cautious with walking especially on uneven ground.  Update 03/18/2022 JM: Patient returns for follow-up visit after prior visit with Dr. Rosemarie approximately 4 months ago.  Since prior visit, reports neuropathy continues to improve since  correcting B12 deficiency.  Recent B12 level satisfactory, recently monitored by PCP.  Neuropathy panel checked at prior visit which showed abnormal M protein spike and referred to hematology for further evaluation, also noted elevated rheumatoid factor but as asymptomatic, further evaluation not pursued.    Bryan Armstrong has also been having issues with imbalance/unsteadiness.  Currently working with PT with continued improvement.  Bryan Armstrong will have imbalance at times with hypotension but rare occurrence is relatively well controlled on midodrine .  Also mentions worsening imbalance with vision fluctuation.  Reports seeing objects smaller from right eye with left eye closed which is not new.  Also mentions ongoing transient right eye altitudinal visual loss (previously discussed with Dr. Rosemarie) but has been improving.  Bryan Armstrong questions further evaluation with ophthalmologist.  No new concerns at this time.  Update 12/04/2021 Dr. Rosemarie Bryan Armstrong returns for follow-up after last visit 3 months ago.  Patient states has noticed improvement in the tingling numbness in his feet after starting vitamin B-12 replacement for a month.  His vitamin B12 was found to be low at 212.  TSH and hemoglobin A1c were normal.  EMG nerve conduction study on 10/24/2021 showed evidence of severe  sensory neuropathy in the legs.  Patient was started on B12 replacement injections and now has been switched to tablets and has noticed a distinct improvement in the numbness which used to be constant every time Bryan Armstrong rested on laydown is now on intermittent and is not as bothersome.  Bryan Armstrong also is to have some mental fogging and lack of thought clarity which also seems to be improving.  Bryan Armstrong complains of occasional transient right altitudinal vision loss for 5 to 10 seconds of especially in his standing or sitting this may be related to his low blood pressure resume at baseline returns below.  Recently his blood pressure medications were reduced.  Bryan Armstrong has been started on  midodrine  for orthostatic hypotension.  Bryan Armstrong also has some difficulty with activities like playing golf for Bryan Armstrong feels Bryan Armstrong is slightly off balance when when Bryan Armstrong is completing the swing Bryan Armstrong is not sure if Bryan Armstrong will fall.   Update 08/26/2021 JM: Returns for 31-month stroke follow-up.  Stable from stroke standpoint without new stroke/TIA symptoms.  Compliant on aspirin  and Crestor  -denies side effects.  Blood pressure today 154/82.  Continues to experience bilateral toe numbness as well as imbalance (discussed at prior visit). Denies worsening since prior visit. B/l toe numbness/tingling worse at night but denies associated pain.  Has been taking B12 supplement. Continues on midodrine  2.5mg  TID which has been helping BP and preventing orthostatic hypotension. Feels like legs may be weaker since prior visit. Occasional vertigo - quick lasting.  Has been participating at [facility] routinely and playing golf.  No further concerns at this time.  Update 04/16/2021 JM: Bryan Armstrong returns for 50-month follow-up.  Stable from stroke standpoint without new stroke/TIA symptoms.  Bryan Armstrong did have 1 episode of vertigo back in April which subsided after performing Epley maneuver -no additional episodes since that time.  His greatest concern today is in regards to gradually worsening BLE numbness/tingling present at nighttime -denies associated pain. PCP recently checked lab work (02/19/2021) with B12 212 and started on monthly injections with last injection 5/27.  Bryan Armstrong believes Bryan Armstrong may have had some benefit of symptoms after B12 injection.  Continues to follow with cardiology for symptomatic hypotension and started on midodrine  on 5/27 - Bryan Armstrong reports taking approx 6-7 pills since starting using more frequently in the first 2 weeks. Bryan Armstrong does have follow up with Dr. Fernande 7/7. BP has been more regulated where Bryan Armstrong has not been experiencing low blood pressure.  Blood pressure today 102/60.  Compliant on aspirin  and Crestor  without associated side effects.  No  further concerns at this time.  Update 11/19/2020 JM: Bryan Armstrong returns for TIA follow-up as well as concerns of dizziness/vertigo unaccompanied.  Reports episode of vertigo 1 week ago where Bryan Armstrong felt as though Bryan Armstrong was getting pulled towards the right side and was able to lower himself to his knees as Bryan Armstrong felt as though Bryan Armstrong was going to fall.  This sensation lasted for 2 to 3 minutes and then subsided.  Bryan Armstrong laid down in his bed and took a nap for approximately 20 minutes with resolution of symptoms upon awakening.  Denies any other associated symptoms such as weakness, numbness/tingling, visual changes, speech changes, facial weakness or headaches.  Bryan Armstrong was evaluated by ENT for continued vertigo (unable to view via epic) - per patient, told left inner ear issue and referred to PT for vestibular rehab.  Bryan Armstrong has been working with neuro rehab PT with improvement of symptoms.  Bryan Armstrong is able to turn his  head more without provoking symptoms and symptoms typically progress with quick head movements.  Bryan Armstrong did trial meclizine  x1 with increased fatigue without noticeable benefit.  Otherwise, Bryan Armstrong has been stable from a stroke standpoint.  Reports ongoing compliance with aspirin  81 mg daily and Crestor  20 mg daily and denies side effects.  Blood pressure today 127/77.  Previously having issues with hypotension therefore PCP discontinued Maxzide  and blood pressure has been slowly stabilizing.  Bryan Armstrong will occasionally have low blood pressure still where Bryan Armstrong will feel lightheaded in 1 to 2 seconds OD visual change which are typical symptoms of low blood pressure for patient.  No further concerns at this time.  Initial visit 06/19/2020 Dr. Rosemarie: Bryan Armstrong is a 89 year old Caucasian male seen today for initial office follow-up visit following hospital consultation for TIA in July 2021.  History is obtained from the patient, review of electronic medical records and I personally reviewed available imaging films in PACS.  Bryan Armstrong has past medical history  for hyperlipidemia, hypertension, peripheral arterial disease, COPD, bradycardia with history of syncope who presented to [facility] on 04/17/2020 with sudden onset of gait instability.  Bryan Armstrong felt suddenly off balance and was leaning to the right side and slammed into a wall.  Bryan Armstrong denied any accompanying vertigo, room spinning, lightheadedness blurred vision no slurred speech or double vision.  This lasted about 5 minutes and then gradually returned back to normal.  Bryan Armstrong stated Bryan Armstrong had a previous episode 10 years ago of suddenly collapsing after getting up from the dining table at a restaurant and was felt to have had a vasovagal syncope at that time.  Bryan Armstrong does have history of peripheral arterial disease and underwent right femoral endarterectomy on 04/03/2020 with Dr. Harvey and left-sided endarterectomy is also planned.  MRI scan of the brain obtained in the hospital was negative for acute infarct and showed small changes of small vessel disease CT angiogram showed no significant extracranial intracranial large vessel stenosis or occlusion.  LDL cholesterol 57 mg percent and hemoglobin A1c was 5.7.  Transthoracic echo showed normal ejection fraction without cardiac source of embolism.  Outpatient cardiac Holter monitoring showed no significant cardiac arrhythmias.  Patient states Bryan Armstrong has had episodes of hypotension which are manifested with blurred vision and feeling tired and fuzzy headed.  The has noticed this happen multiple times when his blood pressure has been below 100 systolic.  Bryan Armstrong does take Maxide about 2 or 3 days a week when his blood pressure is running in the 150s.  Bryan Armstrong seems to be exquisitely sensitive to this.  Bryan Armstrong plans to discuss with his primary care physician alternative blood pressure management strategies.  Bryan Armstrong admits to not drinking enough fluids.  Bryan Armstrong was scheduled to undergo dental surgery soon which she hopes will solve his problems.  Bryan Armstrong has had no recurrent stroke or TIA symptoms.  Bryan Armstrong remains  on Crestor  20 mg Bryan Armstrong is tolerating well without muscle aches and pains.     ROS:   14 system review of systems is positive for those listed in HPI and all other systems negative  PMH:  Past Medical History:  Diagnosis Date   BRADYCARDIA 06/01/2007   Cataract    bil cataracts removed   COPD (chronic obstructive pulmonary disease) (HCC)    EMPHYSEMA 03/28/2008   Emphysema of lung (HCC)    GERD (gastroesophageal reflux disease)    HYPERGLYCEMIA 06/01/2007   HYPERLIPIDEMIA 06/01/2007   Hypertension    HYPERTENSION 01/19/2009   Peripheral arterial  disease (HCC)    TIA (transient ischemic attack)     Social History:  Social History   Socioeconomic History   Marital status: Widowed    Spouse name: Not on file   Number of children: Not on file   Years of education: Not on file   Highest education level: Not on file  Occupational History   Occupation: Retired   Tobacco Use   Smoking status: Former    Current packs/day: 0.00    Average packs/day: 0.5 packs/day for 40.0 years (20.0 ttl pk-yrs)    Types: Cigarettes    Start date: 09/26/1977    Quit date: 09/26/2017    Years since quitting: 6.6   Smokeless tobacco: Never   Tobacco comments:    stopped 2018  Vaping Use   Vaping status: Never Used  Substance and Sexual Activity   Alcohol use: No   Drug use: No   Sexual activity: Not on file  Other Topics Concern   Not on file  Social History Narrative   Lives alone    Right handed   Caffeine: drinks de-caf drinks, diet drinks if its a coke.      Retired    Teacher, early years/pre Strain: Low Risk  (04/26/2024)   Overall Financial Resource Strain (CARDIA)    Difficulty of Paying Living Expenses: Not hard at all  Food Insecurity: No Food Insecurity (04/26/2024)   Hunger Vital Sign    Worried About Running Out of Food in the Last Year: Never true    Ran Out of Food in the Last Year: Never true  Transportation Needs: No Transportation Needs  (04/26/2024)   PRAPARE - Administrator, Civil Service (Medical): No    Lack of Transportation (Non-Medical): No  Physical Activity: Insufficiently Active (04/26/2024)   Exercise Vital Sign    Days of Exercise per Week: 2 days    Minutes of Exercise per Session: 30 min  Stress: No Stress Concern Present (04/26/2024)   [employer] of Occupational Health - Occupational Stress Questionnaire    Feeling of Stress: Not at all  Social Connections: Moderately Isolated (04/26/2024)   Social Connection and Isolation Panel    Frequency of Communication with Friends and Family: More than three times a week    Frequency of Social Gatherings with Friends and Family: More than three times a week    Attends Religious Services: More than 4 times per year    Active Member of [employer] or Organizations: No    Attends Banker Meetings: Never    Marital Status: Widowed  Intimate Partner Violence: Not At Risk (04/26/2024)   Humiliation, Afraid, Rape, and Kick questionnaire    Fear of Current or Ex-Partner: No    Emotionally Abused: No    Physically Abused: No    Sexually Abused: No    Medications:   Current Outpatient Medications on File Prior to Visit  Medication Sig Dispense Refill   aspirin  81 MG tablet Take 81 mg by mouth daily.      cyanocobalamin  2000 MCG tablet Take 1,000 mcg by mouth as needed.     EPINEPHrine  (EPIPEN  2-PAK) 0.3 mg/0.3 mL IJ SOAJ injection Inject 0.3 mLs (0.3 mg total) into the muscle as needed for anaphylaxis. 1 each 0   fluticasone  (FLONASE ) 50 MCG/ACT nasal spray Place 2 sprays into both nostrils as needed for allergies or rhinitis.     fluticasone -salmeterol (WIXELA INHUB) 250-50 MCG/ACT AEPB Inhale 1 puff  into the lungs in the morning and at bedtime. 60 each 11   midodrine  (PROAMATINE ) 5 MG tablet Take 1 tablet (5 mg total) by mouth 3 (three) times daily with meals. 270 tablet 1   rosuvastatin  (CRESTOR ) 20 MG tablet TAKE 1 TABLET(20 MG) BY MOUTH DAILY  AT 6 PM 90 tablet 0   No current facility-administered medications on file prior to visit.    Allergies:   Allergies  Allergen Reactions   Bee Venom Anaphylaxis    Yellow jackets   Meclizine      Fatigue     Physical Exam Today's Vitals   05/04/24 1402  BP: 123/75  Pulse: 65  Weight: 201 lb (91.2 kg)  Height: 5' 11 (1.803 m)   Body mass index is 28.03 kg/m.   General: well developed, well nourished pleasant elderly Caucasian male, seated, in no evident distress  Neurologic Exam Mental Status: Awake and fully alert.  Fluent speech and language.  Oriented to place and time. Recent and remote memory intact. Attention span, concentration and fund of knowledge appropriate. Mood and affect appropriate.  Cranial Nerves: Pupils equal, briskly reactive to light. Extraocular movements full without nystagmus. Visual fields full to confrontation. Hearing slightly diminished bilaterally. Facial sensation intact. Face, tongue, palate moves normally and symmetrically.  Motor: Normal bulk and tone. Normal strength in all tested extremity muscles  Sensory.:  Decreased sensation BLE distally, intact upper extremities Coordination: Rapid alternating movements normal in all extremities. Finger-to-nose and heel-to-shin performed accurately bilaterally. Gait and Station: Arises from chair without difficulty. Stance is slightly hunched. Gait demonstrates broad-based gait with slightly decreased stride length bilaterally without use of assistive device and mild favoring of LLE due to knee pain.  Tandem walk and heel toe not attempted      ASSESSMENT/PLAN: 89 year old male with hx of posterior circulation TIA in July 2021 from small vessel disease.  Vascular risk factors of hypertension hyperlipidemia, peripheral arterial disease, BPPV and age.  Longstanding history of BPPV which has been stable.  Evidence of severe sensory neuropathy per EMG/NCV 10/2021 with significant B12 deficiency and some  improvement of symptoms after correction.  Continues to have transient right eye altitudinal vision loss or transient wavy lines/floaters lasting about 2 minutes possibly retinal migraine as extensive workup largely unremarkable     TIA :  Transient vision impairment: Continue to monitor vision. If this should worsen, can consider symptomatic treatment but currently occurring sporadically and not overly bothersome. 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  Did discuss potential benefit doing aquatic therapy.  Bryan Armstrong plans on looking into [facility] and will call if interested in pursuing formal PT aquatic therapy     Follow-up in 1 year or call earlier if needed    CC:  Kennyth Worth HERO, MD   I personally spent a total of 40 minutes in the care of the patient today including preparing to see the patient, performing a medically appropriate exam/evaluation, counseling and  educating, placing orders, and documenting clinical information in the EHR.   Harlene Bogaert, AGNP-BC  [facility] [address] [place], [place] [address]  Phone [phone] Fax [phone] Note: This document was prepared with digital dictation and possible smart phrase technology. Any transcriptional errors that result from this process are  unintentional.

## 2024-05-04 NOTE — Patient Instructions (Addendum)
 Your Plan:  Continue aspirin  and Crestor  for secondary stroke prevention measures   We will B12 and vit D levels today   Continue Nervive OTC supplement   Look into aquatic therapy at [facility] - if you are interested in doing this with physical therapy, please let me know and I will place an order. They may also have classes that you can participate in     Follow up in 1 year or call earlier if needed     Thank you for coming to see us  at [facility]. I hope we have been able to provide you high quality care today.  You may receive a patient satisfaction survey over the next few weeks. We would appreciate your feedback and comments so that we may continue to improve ourselves and the health of our patients.

## 2024-05-05 DIAGNOSIS — R262 Difficulty in walking, not elsewhere classified: Secondary | ICD-10-CM | POA: Diagnosis not present

## 2024-05-05 DIAGNOSIS — M25561 Pain in right knee: Secondary | ICD-10-CM | POA: Diagnosis not present

## 2024-05-05 DIAGNOSIS — M1711 Unilateral primary osteoarthritis, right knee: Secondary | ICD-10-CM | POA: Diagnosis not present

## 2024-05-05 DIAGNOSIS — M25661 Stiffness of right knee, not elsewhere classified: Secondary | ICD-10-CM | POA: Diagnosis not present

## 2024-05-05 LAB — VITAMIN D 25 HYDROXY (VIT D DEFICIENCY, FRACTURES): Vit D, 25-Hydroxy: 22.1 ng/mL — ABNORMAL LOW (ref 30.0–100.0)

## 2024-05-05 LAB — VITAMIN B12: Vitamin B-12: 590 pg/mL (ref 232–1245)

## 2024-05-10 ENCOUNTER — Ambulatory Visit: Payer: Self-pay | Admitting: Adult Health

## 2024-05-10 ENCOUNTER — Encounter: Admitting: Family Medicine

## 2024-05-12 ENCOUNTER — Encounter: Admitting: Family Medicine

## 2024-05-12 NOTE — Telephone Encounter (Signed)
-----   Message from Harlene Bogaert sent at 05/10/2024  3:49 PM EDT ----- Patient requested to be called with results - please advise recent lab work showed normal B12 level but did show low vitamin D  level.  Recommend he start OTC vitamin D  supplement 600-800 units per  day. Request ongoing monitoring/management by PCP. Thank you.  ----- Message ----- From: Rebecka Memos Lab Results In Sent: 05/05/2024   5:37 AM EDT To: Harlene Bogaert, NP

## 2024-05-12 NOTE — Telephone Encounter (Signed)
Called the pt and reviewed the lab results with him. Pt verbalized understanding. Pt had no questions at this time but was encouraged to call back if questions arise.  

## 2024-05-13 DIAGNOSIS — M25561 Pain in right knee: Secondary | ICD-10-CM | POA: Diagnosis not present

## 2024-05-13 DIAGNOSIS — R262 Difficulty in walking, not elsewhere classified: Secondary | ICD-10-CM | POA: Diagnosis not present

## 2024-05-13 DIAGNOSIS — M25661 Stiffness of right knee, not elsewhere classified: Secondary | ICD-10-CM | POA: Diagnosis not present

## 2024-05-13 DIAGNOSIS — M1711 Unilateral primary osteoarthritis, right knee: Secondary | ICD-10-CM | POA: Diagnosis not present

## 2024-05-19 ENCOUNTER — Ambulatory Visit: Attending: Physician Assistant | Admitting: Physician Assistant

## 2024-05-19 VITALS — BP 108/60 | HR 68 | Ht 71.0 in | Wt 199.0 lb

## 2024-05-19 DIAGNOSIS — I739 Peripheral vascular disease, unspecified: Secondary | ICD-10-CM

## 2024-05-19 DIAGNOSIS — I951 Orthostatic hypotension: Secondary | ICD-10-CM

## 2024-05-19 NOTE — Progress Notes (Signed)
 Cardiology Office Note:  .   Date:  05/19/2024  ID:  Bryan Armstrong, DOB Dec 17, 1934, MRN [id number] PCP: Kennyth Worth HERO, MD  [facility] Providers Cardiologist:  Ozell Fell, MD Electrophysiologist:  Elspeth Sage, MD {  History of Present Illness: Bryan Armstrong is a 89 y.o. male w/PMHx of  RBBB, orthostatic hypotension,  COPD/emphysema, TIA,  PVD (hx of left femoral endarterectomy/patch angioplasty 2021), neuropathy  He saw Dr. Sage 05/15/23, on Proamatine  TID with improved symptoms though over the summer, worse again and Florinef  added.  AT this visit still some lightheadedness, no syncope Markedky abnormal orthostatic BPs this visit. Florinef  increased from 3x/week to daily and avised compression shorts  Saw dr. Fell 07/15/23, doing better. Noted asymmetric BPs with suspected RUE arterial disease, though no symptoms, advised ongoing ASA, statin, with recent carotid US  that was [facility]  October pre-op clearance for knee surgery requested > completed  09/15/23, pt called reported a fall but not syncope and advised to come in I saw him 09/16/24 He reports that the event on Monday was different in tnhat he had no warning. Had woken, sat at the edge of the bed a while as usual, and after 10 steps or so, went out, he had no presyncopal dizziness or symptoms, felt weak afterwards, noticed he was bleeding from his cheek and had fallen on a chair. He did call 911 They discussed with him his poor hydration and had him drink to big glasses of water while they were there with him  He reports self stopping the fludrocortisone  rporting the every other day didn't work and the daily make him uncomfortably swollen. He does take his midodrine  but some days missed a midday dose...like today, forgot to take it before leaving  He does not drink water at all Typically has milk with breakfast and that is about all he drinks in total He does not eat particularly salty or add salt though  admits he has been advised to! He typically has warning with his events and time to get seated. No CP, palpitations, SOB  Planned for monitoring No significant findings Symptoms:             LH and r eye fluttering >> sinus with or without PACs   He saw Dr. Sage 11/17/23, c/w some lightheadedness, better with better oral intake/hydration BP was high though given his orthostatic issues, no changes Arm BPs R vs L inconsistently different   Today's visit is scheduled as a 6 mo visit ROS:   He is doing very well! He is not taking midodrine  unless he needs it Staying hydrated has been the answer for him. No CP, palpitations or cardiac awareness No SOB, DOE Goes to the gym a few days a week, has noticed in general not the same stamina that he used to have. He has b/l LE neuropathy, if careful No falls No near syncope or syncope No claudication either LE or UE  Studies Reviewed: SABRA    EKG not done today  Dec 2024: monitor Findings HR  avg 59  Min 46-Max 95  PVCs Rare, less than 1%   PACs 1%  SVT Nonsustained 77 episodes; fastest 164 bpm for 10 beats ; longest for 16.7  secs at 128 bpm  VT Nonsustained 7 episodes; fastest and longest 203 bpm for 13 beats;  Symptoms:             LH and r eye fluttering >> sinus with or  without PACs                Triggered: rare PAC              Nonsustained  VT and SVT were without symptoms     Aug 2021: monitor 1. SR/SB/ST 2. No signif arrhythmias noted  04/18/2020: TTE 1. Left ventricular ejection fraction, by estimation, is 60 to 65%. The  left ventricle has normal function. The left ventricle has no regional  wall motion abnormalities. Left ventricular diastolic parameters were  normal.   2. Right ventricular systolic function is normal. The right ventricular  size is normal.   3. Left atrial size was mildly dilated.   4. The mitral valve is normal in structure. Trivial mitral valve  regurgitation. No evidence of mitral stenosis.    5. The aortic valve is normal in structure. Aortic valve regurgitation is  not visualized. Mild aortic valve sclerosis is present, with no evidence  of aortic valve stenosis.   6. The inferior vena cava is normal in size with greater than 50%  respiratory variability, suggesting right atrial pressure of 3 mmHg.    Risk Assessment/Calculations:    Physical Exam:   VS:  There were no vitals taken for this visit.   Wt Readings from Last 3 Encounters:  05/04/24 201 lb (91.2 kg)  04/26/24 199 lb (90.3 kg)  04/25/24 199 lb (90.3 kg)    GEN: Well nourished, well developed in no acute distress NECK: No JVD; No carotid bruits CARDIAC: RRR, no murmurs, rubs, gallops RESPIRATORY: CTA b/l without rales, wheezing or rhonchi  ABDOMEN: Soft, non-tender, non-distended EXTREMITIES: No edema; No deformity , good radial pulses bl   ASSESSMENT AND PLAN: .     Orthostatic hypotension Well known for him Well controlled with adequate hydration PRN midodrine , though has not needed it  PVD  hx of R LE intervention Suspect RUE arterial disease with uneven BPs palpable radial pulses, no signs of ischemia and no reports of symptoms of claudication   Advise BPs should be taken on the left   Dispo: with Dr. Celine retirement, he would like to stay on with Dr. Wonda.  See him in 69mo, sooner if needed.  He likes to check in 2x/year  Signed, Charlies Macario Arthur, PA-C

## 2024-05-19 NOTE — Patient Instructions (Signed)
 Medication Instructions:   Your physician recommends that you continue on your current medications as directed. Please refer to the Current Medication list given to you today.   *If you need a refill on your cardiac medications before your next appointment, please call your pharmacy   Lab Work: NONE ORDERED  TODAY     If you have labs (blood work) drawn today and your tests are completely normal, you will receive your results only by: MyChart Message (if you have MyChart) OR A paper copy in the mail If you have any lab test that is abnormal or we need to change your treatment, we will call you to review the results.    Testing/Procedures: NONE ORDERED  TODAY      Follow-Up: At [facility], you and your health needs are our priority.  As part of our continuing mission to provide you with exceptional heart care, our providers are all part of one team.  This team includes your primary Cardiologist (physician) and Advanced Practice Providers or APPs (Physician Assistants and Nurse Practitioners) who all work together to provide you with the care you need, when you need it.  Your next appointment:    6 month(s)   Provider:  DR WONDA     We recommend signing up for the patient portal called MyChart.  Sign up information is provided on this After Visit Summary.  MyChart is used to connect with patients for Virtual Visits (Telemedicine).  Patients are able to view lab/test results, encounter notes, upcoming appointments, etc.  Non-urgent messages can be sent to your provider as well.   To learn more about what you can do with MyChart, go to [email or web address].   Other Instructions

## 2024-05-20 DIAGNOSIS — M1711 Unilateral primary osteoarthritis, right knee: Secondary | ICD-10-CM | POA: Diagnosis not present

## 2024-05-20 DIAGNOSIS — R262 Difficulty in walking, not elsewhere classified: Secondary | ICD-10-CM | POA: Diagnosis not present

## 2024-05-20 DIAGNOSIS — M25661 Stiffness of right knee, not elsewhere classified: Secondary | ICD-10-CM | POA: Diagnosis not present

## 2024-05-20 DIAGNOSIS — M25561 Pain in right knee: Secondary | ICD-10-CM | POA: Diagnosis not present

## 2024-06-06 ENCOUNTER — Other Ambulatory Visit: Payer: Self-pay | Admitting: Pulmonary Disease

## 2024-06-06 NOTE — Telephone Encounter (Signed)
 Copied from CRM [id number]. Topic: Clinical - Medication Refill >> Jun 06, 2024 12:10 PM Rilla B wrote: Medication: fluticasone -salmeterol (WIXELA INHUB) 250-50 MCG/ACT AEPB  Has the patient contacted their pharmacy? Yes (Agent: If no, request that the patient contact the pharmacy for the refill. If patient does not wish to contact the pharmacy document the reason why and proceed with request.) (Agent: If yes, when and what did the pharmacy advise?)  This is the patient's preferred pharmacy:  [id number] [place], [place] - [address] AT [facility] [address] [place] [address] Phone: [phone] Fax: [phone]  Is this the correct pharmacy for this prescription? Yes If no, delete pharmacy and type the correct one.   Has the prescription been filled recently? Yes  Is the patient out of the medication? No  Has the patient been seen for an appointment in the last year OR does the patient have an upcoming appointment? Yes  Can we respond through MyChart? No  Agent: Please be advised that Rx refills may take up to 3 business days. We ask that you follow-up with your pharmacy.

## 2024-06-07 MED ORDER — FLUTICASONE-SALMETEROL 250-50 MCG/ACT IN AEPB: 1.0000 | INHALATION_SPRAY | Freq: Two times a day (BID) | RESPIRATORY_TRACT | 3 refills | Status: DC

## 2024-06-09 DIAGNOSIS — R69 Illness, unspecified: Secondary | ICD-10-CM | POA: Diagnosis not present

## 2024-06-15 ENCOUNTER — Telehealth: Payer: Self-pay | Admitting: Oncology

## 2024-06-15 NOTE — Telephone Encounter (Signed)
 Called PT to reschedule appt. Date and time confirmed.

## 2024-06-21 ENCOUNTER — Telehealth: Payer: Self-pay | Admitting: Oncology

## 2024-06-21 ENCOUNTER — Other Ambulatory Visit: Payer: Medicare Other

## 2024-06-21 ENCOUNTER — Ambulatory Visit: Payer: Medicare Other | Admitting: Oncology

## 2024-06-21 ENCOUNTER — Inpatient Hospital Stay: Admitting: Oncology

## 2024-06-21 ENCOUNTER — Inpatient Hospital Stay: Attending: Oncology

## 2024-06-21 ENCOUNTER — Encounter: Payer: Self-pay | Admitting: *Deleted

## 2024-06-21 DIAGNOSIS — D472 Monoclonal gammopathy: Secondary | ICD-10-CM | POA: Insufficient documentation

## 2024-06-21 DIAGNOSIS — R944 Abnormal results of kidney function studies: Secondary | ICD-10-CM | POA: Insufficient documentation

## 2024-06-21 DIAGNOSIS — G629 Polyneuropathy, unspecified: Secondary | ICD-10-CM | POA: Insufficient documentation

## 2024-06-21 NOTE — Telephone Encounter (Signed)
Called to reschedule appt

## 2024-06-21 NOTE — Progress Notes (Signed)
 Bryan Armstrong was a no show for his lab/OV with Dr. Cloretta today. Scheduling message sent to reschedule for 1 month.

## 2024-06-22 ENCOUNTER — Telehealth: Payer: Self-pay | Admitting: *Deleted

## 2024-06-22 ENCOUNTER — Telehealth: Payer: Self-pay | Admitting: Oncology

## 2024-06-22 NOTE — Telephone Encounter (Signed)
 PT confirmed appt time and day. Reaching out to [place] regarding transportation.

## 2024-06-22 NOTE — Telephone Encounter (Signed)
 Patient called and requested 9/11 appointment be moved due to transportation. Informed him that scheduler will be made aware of his request and call him today.

## 2024-06-23 ENCOUNTER — Inpatient Hospital Stay: Admitting: Oncology

## 2024-06-23 ENCOUNTER — Inpatient Hospital Stay

## 2024-06-23 VITALS — BP 122/84 | HR 76 | Temp 97.8°F | Resp 18 | Ht 71.0 in | Wt 196.7 lb

## 2024-06-23 DIAGNOSIS — G629 Polyneuropathy, unspecified: Secondary | ICD-10-CM | POA: Diagnosis not present

## 2024-06-23 DIAGNOSIS — D472 Monoclonal gammopathy: Secondary | ICD-10-CM

## 2024-06-23 DIAGNOSIS — R944 Abnormal results of kidney function studies: Secondary | ICD-10-CM | POA: Diagnosis not present

## 2024-06-23 LAB — CBC WITH DIFFERENTIAL (CANCER CENTER ONLY)
Abs Immature Granulocytes: 0.01 K/uL (ref 0.00–0.07)
Basophils Absolute: 0.1 K/uL (ref 0.0–0.1)
Basophils Relative: 1 %
Eosinophils Absolute: 0.3 K/uL (ref 0.0–0.5)
Eosinophils Relative: 4 %
HCT: 39.8 % (ref 39.0–52.0)
Hemoglobin: 13.3 g/dL (ref 13.0–17.0)
Immature Granulocytes: 0 %
Lymphocytes Relative: 22 %
Lymphs Abs: 1.5 K/uL (ref 0.7–4.0)
MCH: 29.5 pg (ref 26.0–34.0)
MCHC: 33.4 g/dL (ref 30.0–36.0)
MCV: 88.2 fL (ref 80.0–100.0)
Monocytes Absolute: 0.5 K/uL (ref 0.1–1.0)
Monocytes Relative: 7 %
Neutro Abs: 4.7 K/uL (ref 1.7–7.7)
Neutrophils Relative %: 66 %
Platelet Count: 159 K/uL (ref 150–400)
RBC: 4.51 MIL/uL (ref 4.22–5.81)
RDW: 13.3 % (ref 11.5–15.5)
WBC Count: 7 K/uL (ref 4.0–10.5)
nRBC: 0 % (ref 0.0–0.2)

## 2024-06-23 LAB — CMP (CANCER CENTER ONLY)
ALT: 30 U/L (ref 0–44)
AST: 29 U/L (ref 15–41)
Albumin: 4.3 g/dL (ref 3.5–5.0)
Alkaline Phosphatase: 66 U/L (ref 38–126)
Anion gap: 11 (ref 5–15)
BUN: 19 mg/dL (ref 8–23)
CO2: 26 mmol/L (ref 22–32)
Calcium: 9.4 mg/dL (ref 8.9–10.3)
Chloride: 104 mmol/L (ref 98–111)
Creatinine: 1.27 mg/dL — ABNORMAL HIGH (ref 0.61–1.24)
GFR, Estimated: 55 mL/min — ABNORMAL LOW (ref >60)
Glucose, Bld: 98 mg/dL (ref 70–99)
Potassium: 4 mmol/L (ref 3.5–5.1)
Sodium: 141 mmol/L (ref 135–145)
Total Bilirubin: 0.7 mg/dL (ref 0.0–1.2)
Total Protein: 7.1 g/dL (ref 6.5–8.1)

## 2024-06-23 NOTE — Progress Notes (Signed)
  [facility] OFFICE PROGRESS NOTE   Diagnosis: Serum monoclonal protein  INTERVAL HISTORY:   Bryan Armstrong returns as scheduled.  He reports improvement in the pain after an injection.  He has intermittent orthostatic symptoms.  He continues to have numbness in the feet.  He believes that this has progressed over the past few years.  No fever, night sweats, or recent infection.  No pain aside from the knee pain.  Objective:  Vital signs in last 24 hours:  Blood pressure 122/84, pulse 76, temperature 97.8 F (36.6 C), temperature source Temporal, resp. rate 18, height 5' 11 (1.803 m), weight 196 lb 11.2 oz (89.2 kg), SpO2 97%.    Lymphatics: No cervical, supraclavicular, axillary, or inguinal nodes Resp: Lungs clear bilaterally Cardio: Regular rate and rhythm GI: No hepatosplenomegaly Vascular: No leg edema   Lab Results:  Lab Results  Component Value Date   WBC 7.0 06/23/2024   HGB 13.3 06/23/2024   HCT 39.8 06/23/2024   MCV 88.2 06/23/2024   PLT 159 06/23/2024   NEUTROABS 4.7 06/23/2024    CMP  Lab Results  Component Value Date   NA 141 06/23/2024   K 4.0 06/23/2024   CL 104 06/23/2024   CO2 26 06/23/2024   GLUCOSE 98 06/23/2024   BUN 19 06/23/2024   CREATININE 1.27 (H) 06/23/2024   CALCIUM  9.4 06/23/2024   PROT 7.1 06/23/2024   ALBUMIN  4.3 06/23/2024   AST 29 06/23/2024   ALT 30 06/23/2024   ALKPHOS 66 06/23/2024   BILITOT 0.7 06/23/2024   GFRNONAA 55 (L) 06/23/2024   GFRAA >60 04/18/2020     Medications: I have reviewed the patient's current medications.   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  He is stable from a hematologic standpoint.  There is no clinical evidence for progression to multiple myeloma or another lymphoproliferative disorder.  We will follow-up on the myeloma panel from today.  He will return for an office and lab visit in 9 months.  The creatinine is borderline elevated today.  I doubt this is related to the monoclonal protein.  He will follow-up with Dr. Kennyth for the elevated creatinine.  Bryan Armstrong will continue follow-up with neurology for the peripheral neuropathy.  The peripheral neuropathy is most likely unrelated to the IgG kappa protein.  He plans to obtain an influenza vaccine.  Arley Hof, MD  06/23/2024  9:54 AM

## 2024-06-24 ENCOUNTER — Ambulatory Visit: Admitting: Family Medicine

## 2024-06-24 ENCOUNTER — Encounter: Payer: Self-pay | Admitting: Family Medicine

## 2024-06-24 VITALS — BP 144/75 | HR 57 | Temp 98.0°F | Ht 71.0 in | Wt 195.0 lb

## 2024-06-24 DIAGNOSIS — E538 Deficiency of other specified B group vitamins: Secondary | ICD-10-CM

## 2024-06-24 DIAGNOSIS — Z0001 Encounter for general adult medical examination with abnormal findings: Secondary | ICD-10-CM

## 2024-06-24 DIAGNOSIS — I952 Hypotension due to drugs: Secondary | ICD-10-CM

## 2024-06-24 DIAGNOSIS — Z0000 Encounter for general adult medical examination without abnormal findings: Secondary | ICD-10-CM

## 2024-06-24 DIAGNOSIS — D892 Hypergammaglobulinemia, unspecified: Secondary | ICD-10-CM | POA: Insufficient documentation

## 2024-06-24 DIAGNOSIS — I951 Orthostatic hypotension: Secondary | ICD-10-CM

## 2024-06-24 DIAGNOSIS — E785 Hyperlipidemia, unspecified: Secondary | ICD-10-CM | POA: Diagnosis not present

## 2024-06-24 DIAGNOSIS — J449 Chronic obstructive pulmonary disease, unspecified: Secondary | ICD-10-CM

## 2024-06-24 DIAGNOSIS — R7309 Other abnormal glucose: Secondary | ICD-10-CM | POA: Diagnosis not present

## 2024-06-24 LAB — KAPPA/LAMBDA LIGHT CHAINS
Kappa free light chain: 34.3 mg/L — ABNORMAL HIGH (ref 3.3–19.4)
Kappa, lambda light chain ratio: 1.45 (ref 0.26–1.65)
Lambda free light chains: 23.7 mg/L (ref 5.7–26.3)

## 2024-06-24 NOTE — Progress Notes (Signed)
 Chief Complaint:  Bryan Armstrong is a 89 y.o. male who presents today for his annual comprehensive physical exam.    Assessment/Plan:  Elevated creatinine Reviewed recent labs from oncology showed mildly elevated creatinine.  He will come back in a couple weeks to recheck encouraged hydration.  Dyslipidemia On Crestor  10 mg daily.  Check lipids with next set of labs.  COPD, group A, by GOLD 2017 classification [facility]) Follows with pulmonology.  Symptoms are stable on Wixela  HYPERGLYCEMIA Check A1c with next set of labs.  Orthostatic hypotension Follows with cardiology.  Symptoms are controlled with midodrine  5 mg 3 times daily.  Vitamin B 12 deficiency Check B12 with labs.  Paraproteinemia Continue management per oncology.   Preventative Healthcare: Deferred flu vaccine until October.  He will come back for labs.  Patient Counseling(The following topics were reviewed and/or handout was given):  -Nutrition: Stressed importance of moderation in sodium/caffeine intake, saturated fat and cholesterol, caloric balance, sufficient intake of fresh fruits, vegetables, and fiber.  -Stressed the importance of regular exercise.   -Substance Abuse: Discussed cessation/primary prevention of tobacco, alcohol, or other drug use; driving or other dangerous activities under the influence; availability of treatment for abuse.   -Injury prevention: Discussed safety belts, safety helmets, smoke detector, smoking near bedding or upholstery.   -Sexuality: Discussed sexually transmitted diseases, partner selection, use of condoms, avoidance of unintended pregnancy and contraceptive alternatives.   -Dental health: Discussed importance of regular tooth brushing, flossing, and dental visits.  -Health maintenance and immunizations reviewed. Please refer to Health maintenance section.  Return to care in 1 year for next preventative visit.     Subjective:  HPI:  He has no acute complaints today.  Patient is here today for his  annual physical.  See assessment / plan for status of chronic conditions.  Discussed the use of AI scribe software for clinical note transcription with the patient, who gave verbal consent to proceed.  History of Present Illness Bryan Armstrong is an 89 year old male who presents for an annual physical exam.  He has ongoing issues with low blood pressure which is being managed by cardiology, which he believes may be connected to his vision problems. He experiences episodes where his right eye has 'half vision' with the upper half appearing dark. Staying hydrated helps manage his blood pressure, and he can go a month without issues if he maintains adequate fluid intake. However, if he falls behind on hydration, he is more likely to experience symptoms. He has not experienced any falls or episodes of syncope, but he does feel lightheaded and almost like he might pass out when his blood pressure drops. He compares the sensation to an episode of vertigo he had about five years ago, which made him feel very sick.       06/23/2024    9:56 AM  Depression screen PHQ 2/9  Decreased Interest 0  Down, Depressed, Hopeless 0  PHQ - 2 Score 0    There are no preventive care reminders to display for this patient.   ROS: Per HPI, otherwise a complete review of systems was negative.   PMH:  The following were reviewed and entered/updated in epic: Past Medical History:  Diagnosis Date   BRADYCARDIA 06/01/2007   Cataract    bil cataracts removed   COPD (chronic obstructive pulmonary disease) (HCC)    EMPHYSEMA 03/28/2008   Emphysema of lung (HCC)    GERD (gastroesophageal reflux disease)  HYPERGLYCEMIA 06/01/2007   HYPERLIPIDEMIA 06/01/2007   Hypertension    HYPERTENSION 01/19/2009   Peripheral arterial disease (HCC)    TIA (transient ischemic attack)    Patient Active Problem List   Diagnosis Date Noted   RBBB 05/12/2023   Vasovagal syncope 08/12/2022    Vertigo 06/09/2022   Osteoarthritis 02/24/2022   Vitamin B 12 deficiency 10/03/2021   Orthostatic hypotension 04/16/2021   Neuropathy 02/19/2021   Abnormal CXR 02/13/2021   BPV (benign positional vertigo) 02/13/2021   Hypotension 06/19/2020   TIA (transient ischemic attack) 04/18/2020   PAD (peripheral artery disease) (HCC) 04/03/2020   Neck pain 12/30/2018   GERD (gastroesophageal reflux disease) 09/28/2018   Peripheral arterial disease (HCC) 06/15/2018   Former smoker 06/15/2018   History of colonic polyps 01/07/2018   Allergic rhinitis 05/31/2013   Urticaria 11/02/2011   COPD, group A, by GOLD 2017 classification (HCC) 03/28/2008   Dyslipidemia 06/01/2007   Bradycardia 06/01/2007   HYPERGLYCEMIA 06/01/2007   Past Surgical History:  Procedure Laterality Date   ABDOMINAL AORTOGRAM W/LOWER EXTREMITY N/A 03/05/2020   Procedure: ABDOMINAL AORTOGRAM W/  LOWER EXTREMITY;  Surgeon: Court Dorn PARAS, MD;  Location: [facility] INVASIVE CV LAB;  Service: Cardiovascular;  Laterality: N/A;   CATARACT EXTRACTION, BILATERAL     COLONOSCOPY     ENDARTERECTOMY FEMORAL Right 04/03/2020   Procedure: Right Femoral Endarterectomy;  Surgeon: Harvey Carlin BRAVO, MD;  Location: [facility] OR;  Service: Vascular;  Laterality: Right;   ENDARTERECTOMY FEMORAL Left 09/10/2020   Procedure: ENDARTERECTOMY FEMORAL LEFT;  Surgeon: Harvey Carlin BRAVO, MD;  Location: [facility] OR;  Service: Vascular;  Laterality: Left;   EYE SURGERY     Bilaterl cataracts   PATCH ANGIOPLASTY Left 09/10/2020   Procedure: PATCH ANGIOPLASTY OF LEFT FEMORAL ARTERY USING HEMASHIELD PLATINUM FINESSE PATCH;  Surgeon: Harvey Carlin BRAVO, MD;  Location: [facility] OR;  Service: Vascular;  Laterality: Left;   POLYPECTOMY     TONSILLECTOMY AND ADENOIDECTOMY      Family History  Problem Relation Age of Onset   Cancer Mother        Male cancer   Uterine cancer Mother    Heart disease Father    Colon cancer Neg Hx    Esophageal cancer Neg Hx    Rectal  cancer Neg Hx    Stomach cancer Neg Hx    Liver cancer Neg Hx    Pancreatic cancer Neg Hx    Prostate cancer Neg Hx    Stroke Neg Hx     Medications- reviewed and updated Current Outpatient Medications  Medication Sig Dispense Refill   aspirin  81 MG tablet Take 81 mg by mouth daily.      fluticasone  (FLONASE ) 50 MCG/ACT nasal spray Place 2 sprays into both nostrils as needed for allergies or rhinitis.     fluticasone -salmeterol (WIXELA INHUB) 250-50 MCG/ACT AEPB Inhale 1 puff into the lungs in the morning and at bedtime. 60 each 3   midodrine  (PROAMATINE ) 5 MG tablet Take 1 tablet (5 mg total) by mouth 3 (three) times daily with meals. 270 tablet 1   rosuvastatin  (CRESTOR ) 20 MG tablet TAKE 1 TABLET(20 MG) BY MOUTH DAILY AT 6 PM 90 tablet 0   No current facility-administered medications for this visit.    Allergies-reviewed and updated Allergies  Allergen Reactions   Bee Venom Anaphylaxis    Yellow jackets   Meclizine      Fatigue     Social History   Socioeconomic History   Marital status: Widowed  Spouse name: Not on file   Number of children: Not on file   Years of education: Not on file   Highest education level: Not on file  Occupational History   Occupation: Retired   Tobacco Use   Smoking status: Former    Current packs/day: 0.00    Average packs/day: 0.5 packs/day for 40.0 years (20.0 ttl pk-yrs)    Types: Cigarettes    Start date: 09/26/1977    Quit date: 09/26/2017    Years since quitting: 6.7   Smokeless tobacco: Never   Tobacco comments:    stopped 2018  Vaping Use   Vaping status: Never Used  Substance and Sexual Activity   Alcohol use: No   Drug use: No   Sexual activity: Not on file  Other Topics Concern   Not on file  Social History Narrative   Lives alone    Right handed   Caffeine: drinks de-caf drinks, diet drinks if its a coke.      Retired    Teacher, early years/pre Strain: Low Risk  (04/26/2024)   Overall  Financial Resource Strain (CARDIA)    Difficulty of Paying Living Expenses: Not hard at all  Food Insecurity: No Food Insecurity (04/26/2024)   Hunger Vital Sign    Worried About Running Out of Food in the Last Year: Never true    Ran Out of Food in the Last Year: Never true  Transportation Needs: No Transportation Needs (04/26/2024)   PRAPARE - Administrator, Civil Service (Medical): No    Lack of Transportation (Non-Medical): No  Physical Activity: Insufficiently Active (04/26/2024)   Exercise Vital Sign    Days of Exercise per Week: 2 days    Minutes of Exercise per Session: 30 min  Stress: No Stress Concern Present (04/26/2024)   [employer] of Occupational Health - Occupational Stress Questionnaire    Feeling of Stress: Not at all  Social Connections: Moderately Isolated (04/26/2024)   Social Connection and Isolation Panel    Frequency of Communication with Friends and Family: More than three times a week    Frequency of Social Gatherings with Friends and Family: More than three times a week    Attends Religious Services: More than 4 times per year    Active Member of [employer] or Organizations: No    Attends Banker Meetings: Never    Marital Status: Widowed        Objective:  Physical Exam: BP (!) 144/75   Pulse (!) 57   Temp 98 F (36.7 C) (Temporal)   Ht 5' 11 (1.803 m)   Wt 195 lb (88.5 kg)   SpO2 95%   BMI 27.20 kg/m   Body mass index is 27.2 kg/m. Wt Readings from Last 3 Encounters:  06/24/24 195 lb (88.5 kg)  06/23/24 196 lb 11.2 oz (89.2 kg)  05/19/24 199 lb (90.3 kg)   Gen: NAD, resting comfortably HEENT: TMs normal bilaterally. OP clear. No thyromegaly noted.  CV: RRR with no murmurs appreciated Pulm: NWOB, CTAB with no crackles, wheezes, or rhonchi GI: Normal bowel sounds present. Soft, Nontender, Nondistended. MSK: no edema, cyanosis, or clubbing noted Skin: warm, dry Neuro: CN2-12 grossly intact. Strength 5/5 in upper and  lower extremities. Reflexes symmetric and intact bilaterally.  Psych: Normal affect and thought content     Dereon Williamsen M. Kennyth, MD 06/24/2024 2:05 PM

## 2024-06-24 NOTE — Assessment & Plan Note (Signed)
 Follows with cardiology.  Symptoms are controlled with midodrine  5 mg 3 times daily.

## 2024-06-24 NOTE — Assessment & Plan Note (Signed)
 Follows with pulmonology.  Symptoms are stable on Wixela

## 2024-06-24 NOTE — Assessment & Plan Note (Signed)
 On Crestor  10 mg daily.  Check lipids with next set of labs.

## 2024-06-24 NOTE — Assessment & Plan Note (Signed)
 Check B12 with labs.

## 2024-06-24 NOTE — Assessment & Plan Note (Signed)
Check A1c with next set of labs. 

## 2024-06-24 NOTE — Patient Instructions (Signed)
 It was very nice to see you today!  VISIT SUMMARY: You came in for your annual physical exam. We discussed your ongoing issues with low blood pressure and vision problems, and reviewed your recent blood work.  YOUR PLAN: ORTHOSTATIC HYPOTENSION AND HYPOTENSION DUE TO DRUGS: You have chronic low blood pressure that gets worse when you don't drink enough fluids. -Stay hydrated by drinking plenty of fluids and increase your dietary salt intake. -Monitor your blood pressure regularly.  ABNORMAL KIDNEY FUNCTION LIKELY RELATED TO DEHYDRATION: Your recent blood work showed elevated creatinine levels, which suggests you might be dehydrated. -Make sure to drink enough fluids. -We will repeat your kidney function tests in a few weeks to monitor your levels.  Return in about 1 year (around 06/24/2025) for Annual Physical, come back in 2 weeks for a lab visit.   Take care, Dr Kennyth  PLEASE NOTE:  If you had any lab tests, please let us  know if you have not heard back within a few days. You may see your results on mychart before we have a chance to review them but we will give you a call once they are reviewed by us .   If we ordered any referrals today, please let us  know if you have not heard from their office within the next week.   If you had any urgent prescriptions sent in today, please check with the pharmacy within an hour of our visit to make sure the prescription was transmitted appropriately.   Please try these tips to maintain a healthy lifestyle:  Eat at least 3 REAL meals and 1-2 snacks per day.  Aim for no more than 5 hours between eating.  If you eat breakfast, please do so within one hour of getting up.   Each meal should contain half fruits/vegetables, one quarter protein, and one quarter carbs (no bigger than a computer mouse)  Cut down on sweet beverages. This includes juice, soda, and sweet tea.   Drink at least 1 glass of water with each meal and aim for at least 8 glasses  per day  Exercise at least 150 minutes every week.    Preventive Care 37 Years and Older, Male Preventive care refers to lifestyle choices and visits with your health care provider that can promote health and wellness. Preventive care visits are also called wellness exams. What can I expect for my preventive care visit? Counseling During your preventive care visit, your health care provider may ask about your: Medical history, including: Past medical problems. Family medical history. History of falls. Current health, including: Emotional well-being. Home life and relationship well-being. Sexual activity. Memory and ability to understand (cognition). Lifestyle, including: Alcohol, nicotine or tobacco, and drug use. Access to firearms. Diet, exercise, and sleep habits. Work and work Astronomer. Sunscreen use. Safety issues such as seatbelt and bike helmet use. Physical exam Your health care provider will check your: Height and weight. These may be used to calculate your BMI (body mass index). BMI is a measurement that tells if you are at a healthy weight. Waist circumference. This measures the distance around your waistline. This measurement also tells if you are at a healthy weight and may help predict your risk of certain diseases, such as type 2 diabetes and high blood pressure. Heart rate and blood pressure. Body temperature. Skin for abnormal spots. What immunizations do I need?  Vaccines are usually given at various ages, according to a schedule. Your health care provider will recommend vaccines for you based  on your age, medical history, and lifestyle or other factors, such as travel or where you work. What tests do I need? Screening Your health care provider may recommend screening tests for certain conditions. This may include: Lipid and cholesterol levels. Diabetes screening. This is done by checking your blood sugar (glucose) after you have not eaten for a while  (fasting). Hepatitis C test. Hepatitis B test. HIV (human immunodeficiency virus) test. STI (sexually transmitted infection) testing, if you are at risk. Lung cancer screening. Colorectal cancer screening. Prostate cancer screening. Abdominal aortic aneurysm (AAA) screening. You may need this if you are a current or former smoker. Talk with your health care provider about your test results, treatment options, and if necessary, the need for more tests. Follow these instructions at home: Eating and drinking  Eat a diet that includes fresh fruits and vegetables, whole grains, lean protein, and low-fat dairy products. Limit your intake of foods with high amounts of sugar, saturated fats, and salt. Take vitamin and mineral supplements as recommended by your health care provider. Do not drink alcohol if your health care provider tells you not to drink. If you drink alcohol: Limit how much you have to 0-2 drinks a day. Know how much alcohol is in your drink. In the [place] one drink equals one 12 oz bottle of beer (355 mL), one 5 oz glass of wine (148 mL), or one 1 oz glass of hard liquor (44 mL). Lifestyle Brush your teeth every morning and night with fluoride toothpaste. Floss one time each day. Exercise for at least 30 minutes 5 or more days each week. Do not use any products that contain nicotine or tobacco. These products include cigarettes, chewing tobacco, and vaping devices, such as e-cigarettes. If you need help quitting, ask your health care provider. Do not use drugs. If you are sexually active, practice safe sex. Use a condom or other form of protection to prevent STIs. Take aspirin  only as told by your health care provider. Make sure that you understand how much to take and what form to take. Work with your health care provider to find out whether it is safe and beneficial for you to take aspirin  daily. Ask your health care provider if you need to take a cholesterol-lowering medicine  (statin). Find healthy ways to manage stress, such as: Meditation, yoga, or listening to music. Journaling. Talking to a trusted person. Spending time with friends and family. Safety Always wear your seat belt while driving or riding in a vehicle. Do not drive: If you have been drinking alcohol. Do not ride with someone who has been drinking. When you are tired or distracted. While texting. If you have been using any mind-altering substances or drugs. Wear a helmet and other protective equipment during sports activities. If you have firearms in your house, make sure you follow all gun safety procedures. Minimize exposure to UV radiation to reduce your risk of skin cancer. What's next? Visit your health care provider once a year for an annual wellness visit. Ask your health care provider how often you should have your eyes and teeth checked. Stay up to date on all vaccines. This information is not intended to replace advice given to you by your health care provider. Make sure you discuss any questions you have with your health care provider. Document Revised: 03/27/2021 Document Reviewed: 03/27/2021 Elsevier Patient Education  2024 [employer].

## 2024-06-24 NOTE — Assessment & Plan Note (Signed)
Continue management per oncology. 

## 2024-06-27 LAB — MULTIPLE MYELOMA PANEL, SERUM
Albumin SerPl Elph-Mcnc: 3.7 g/dL (ref 2.9–4.4)
Albumin/Glob SerPl: 1.4 (ref 0.7–1.7)
Alpha 1: 0.2 g/dL (ref 0.0–0.4)
Alpha2 Glob SerPl Elph-Mcnc: 0.7 g/dL (ref 0.4–1.0)
B-Globulin SerPl Elph-Mcnc: 0.8 g/dL (ref 0.7–1.3)
Gamma Glob SerPl Elph-Mcnc: 1.1 g/dL (ref 0.4–1.8)
Globulin, Total: 2.8 g/dL (ref 2.2–3.9)
IgA: 277 mg/dL (ref 61–437)
IgG (Immunoglobin G), Serum: 1057 mg/dL (ref 603–1613)
IgM (Immunoglobulin M), Srm: 63 mg/dL (ref 15–143)
M Protein SerPl Elph-Mcnc: 0.4 g/dL — ABNORMAL HIGH
Total Protein ELP: 6.5 g/dL (ref 6.0–8.5)

## 2024-06-28 ENCOUNTER — Encounter: Payer: Self-pay | Admitting: *Deleted

## 2024-06-28 ENCOUNTER — Ambulatory Visit: Payer: Self-pay | Admitting: Oncology

## 2024-06-28 NOTE — Progress Notes (Signed)
 Mailed Mr. Westenberger 06/23/24 labs to his home as requested.

## 2024-07-01 ENCOUNTER — Telehealth: Payer: Self-pay | Admitting: Family Medicine

## 2024-07-01 NOTE — Telephone Encounter (Unsigned)
 Copied from CRM #[id number]. Topic: General - Other >> Jul 01, 2024  3:11 PM Jasmin G wrote: Reason for CRM: Pt states that he requested to pick up a copy of his blood work, please call him back at [phone] to let him know when it's ready.

## 2024-07-05 NOTE — Telephone Encounter (Signed)
Patient gave verbal understanding and had no questions or concerns.

## 2024-07-05 NOTE — Telephone Encounter (Signed)
-----   Message from Arley Hof sent at 06/28/2024  4:27 PM EDT ----- Please call patient the serum monoclonal protein level is stable, follow-up as scheduled  ----- Message ----- From: Rebecka, Lab In Many Sent: 06/23/2024   9:25 AM EDT To: Arley KATHEE Hof, MD

## 2024-07-06 NOTE — Telephone Encounter (Signed)
 Spoke with patient, has a Lab appt on Friday  Requesting lab results for that day  Notified once we have results and PCP reviewed them I will placed a copy at our front office to be pick up

## 2024-07-08 ENCOUNTER — Other Ambulatory Visit

## 2024-07-13 ENCOUNTER — Other Ambulatory Visit (INDEPENDENT_AMBULATORY_CARE_PROVIDER_SITE_OTHER)

## 2024-07-13 DIAGNOSIS — E538 Deficiency of other specified B group vitamins: Secondary | ICD-10-CM

## 2024-07-13 DIAGNOSIS — R7309 Other abnormal glucose: Secondary | ICD-10-CM | POA: Diagnosis not present

## 2024-07-13 DIAGNOSIS — Z0001 Encounter for general adult medical examination with abnormal findings: Secondary | ICD-10-CM

## 2024-07-13 DIAGNOSIS — E785 Hyperlipidemia, unspecified: Secondary | ICD-10-CM | POA: Diagnosis not present

## 2024-07-13 DIAGNOSIS — I951 Orthostatic hypotension: Secondary | ICD-10-CM

## 2024-07-14 LAB — COMPREHENSIVE METABOLIC PANEL WITH GFR
ALT: 30 U/L (ref 0–53)
AST: 23 U/L (ref 0–37)
Albumin: 4.2 g/dL (ref 3.5–5.2)
Alkaline Phosphatase: 56 U/L (ref 39–117)
BUN: 20 mg/dL (ref 6–23)
CO2: 28 meq/L (ref 19–32)
Calcium: 9 mg/dL (ref 8.4–10.5)
Chloride: 103 meq/L (ref 96–112)
Creatinine, Ser: 1.07 mg/dL (ref 0.40–1.50)
GFR: 62.35 mL/min (ref >60.00)
Glucose, Bld: 91 mg/dL (ref 70–99)
Potassium: 4.3 meq/L (ref 3.5–5.1)
Sodium: 140 meq/L (ref 135–145)
Total Bilirubin: 0.7 mg/dL (ref 0.2–1.2)
Total Protein: 6.6 g/dL (ref 6.0–8.3)

## 2024-07-14 LAB — LIPID PANEL
Cholesterol: 95 mg/dL (ref 0–200)
HDL: 35.6 mg/dL — ABNORMAL LOW (ref >39.00)
LDL Cholesterol: 41 mg/dL (ref 0–99)
NonHDL: 59.27
Total CHOL/HDL Ratio: 3
Triglycerides: 90 mg/dL (ref 0.0–149.0)
VLDL: 18 mg/dL (ref 0.0–40.0)

## 2024-07-14 LAB — CBC
HCT: 39.4 % (ref 39.0–52.0)
Hemoglobin: 13.2 g/dL (ref 13.0–17.0)
MCHC: 33.6 g/dL (ref 30.0–36.0)
MCV: 87.8 fl (ref 78.0–100.0)
Platelets: 194 K/uL (ref 150.0–400.0)
RBC: 4.48 Mil/uL (ref 4.22–5.81)
RDW: 13.9 % (ref 11.5–15.5)
WBC: 8 K/uL (ref 4.0–10.5)

## 2024-07-14 LAB — VITAMIN B12: Vitamin B-12: 367 pg/mL (ref 211–911)

## 2024-07-14 LAB — TSH: TSH: 4.24 u[IU]/mL (ref 0.35–5.50)

## 2024-07-14 LAB — HEMOGLOBIN A1C: Hgb A1c MFr Bld: 5.8 % (ref 4.6–6.5)

## 2024-07-15 ENCOUNTER — Ambulatory Visit: Payer: Self-pay | Admitting: Family Medicine

## 2024-07-15 NOTE — Progress Notes (Signed)
 Labs are all at goal.  Do not need to make any changes to treatment plan.  He should keep up the great work and we can recheck again in a year or so.

## 2024-07-19 ENCOUNTER — Ambulatory Visit: Admitting: Pulmonary Disease

## 2024-08-22 ENCOUNTER — Encounter: Payer: Self-pay | Admitting: Pulmonary Disease

## 2024-08-22 ENCOUNTER — Ambulatory Visit: Admitting: Pulmonary Disease

## 2024-08-22 VITALS — BP 91/69 | HR 59 | Temp 97.7°F | Wt 201.0 lb

## 2024-08-22 DIAGNOSIS — I959 Hypotension, unspecified: Secondary | ICD-10-CM

## 2024-08-22 DIAGNOSIS — J449 Chronic obstructive pulmonary disease, unspecified: Secondary | ICD-10-CM | POA: Diagnosis not present

## 2024-08-22 MED ORDER — BUDESONIDE-FORMOTEROL FUMARATE 160-4.5 MCG/ACT IN AERO: 2.0000 | INHALATION_SPRAY | Freq: Two times a day (BID) | RESPIRATORY_TRACT | 12 refills | Status: AC

## 2024-08-22 NOTE — Patient Instructions (Signed)
  VISIT SUMMARY: During your visit, we discussed the management of your COPD and addressed your episodes of low blood pressure. We also reviewed your physical activity and how it is affected by your neuropathy.  YOUR PLAN: CHRONIC OBSTRUCTIVE PULMONARY DISEASE (COPD): You have been managing your COPD with the Wixela inhaler, but you feel it is less effective than your previous medication, Advair . -We are switching your inhaler to Symbicort. Please take two puffs in the morning and two puffs in the evening. -If you find Symbicort is not effective, contact the clinic to discuss reverting to [facility].  HYPOTENSION: You have been experiencing episodes of low blood pressure, which sometimes cause lightheadedness and fainting. -Continue taking midodrine  as prescribed to help manage your blood pressure. -Increase your fluid intake to help prevent dehydration, which may be contributing to your symptoms.                            Contains text generated by [employer].

## 2024-08-22 NOTE — Progress Notes (Signed)
 Bryan Armstrong    [id number]    1935/03/15  Primary Care Physician:Parker, Worth HERO, MD  Referring Physician: Kennyth Worth HERO, MD [address] [place],  [place] [address]  Chief complaint:   Follow up for COPD GOLD A  HPI: Bryan Armstrong is a 89 y.o.  with COPD GOLD A (CAT score 7, no exacerbations, FEV1 89%). He is doing well with this current inhaler therapy. He is also on albuterol  when necessary. However he does not need his rescue inhaler. He does not have any recent exacerbations or ER visits.  He feels well today with dyspnea on exertion at baseline. He denies any fevers, cough, wheezing, chest pain, palpitation. He quit smoking in September 2017. He used to smoke about half pack a day for about 40 years.  Interim History: Discussed the use of AI scribe software for clinical note transcription with the patient, who gave verbal consent to proceed.  History of Present Illness Bryan Armstrong is an 89 year old male with COPD who presents for follow-up of his respiratory condition and medication management.  Chronic obstructive pulmonary disease (copd) symptoms and management - Uses Wixela inhaler for COPD management; switched from previous medication due to insurance reasons - Finds Wixela less beneficial than previous inhalers - Occasional clogging of the inhaler mouthpiece, possibly related to dust exposure  Physical activity and functional status - Remains physically active despite neuropathy - Exercises regularly at [facility] - Neuropathy limits activity levels but does not prevent participation in exercise    Outpatient Encounter Medications as of 08/22/2024  Medication Sig   aspirin  81 MG tablet Take 81 mg by mouth daily.    fluticasone  (FLONASE ) 50 MCG/ACT nasal spray Place 2 sprays into both nostrils as needed for allergies or rhinitis.   fluticasone -salmeterol (WIXELA INHUB) 250-50 MCG/ACT AEPB Inhale 1 puff into the lungs in the morning and at  bedtime.   midodrine  (PROAMATINE ) 5 MG tablet Take 1 tablet (5 mg total) by mouth 3 (three) times daily with meals.   rosuvastatin  (CRESTOR ) 20 MG tablet TAKE 1 TABLET(20 MG) BY MOUTH DAILY AT 6 PM   No facility-administered encounter medications on file as of 08/22/2024.   Vitals:   08/22/24 1516  BP: 91/69  Pulse: (!) 59  Temp: 97.7 F (36.5 C)  Weight: 201 lb (91.2 kg)  SpO2: 93%  TempSrc: Oral    Physical Exam VITALS: BP- 90/ GEN: No acute distress CV: Regular rate and rhythm no murmurs LUNGS: Clear to auscultation bilaterally normal respiratory effort SKIN JOINTS: Warm and dry no rash    Data Reviewed: Imaging: Chest x-ray 09/29/2019-small reticulonodular opacity at the right lung base Chest x-ray 11/11/2019-no acute cardiopulmonary abnormality. Chest x-ray 02/13/2021-no acute cardiopulmonary abnormality I have reviewed the images personally.  PFTs [01/06/05] FVC 4.50 [96%], FEV1 2.85 [89], F/F 63, TLC 7.76 [111%], DLCO 132%. Mild airway obstruction, normal lung volumes, no bronchial dilator response.  Labs:  CBC 12/11/16-absolute eosinophilic count 600 Assessment & Plan Chronic obstructive pulmonary disease (COPD) COPD management is ongoing. He was previously on Advair  but was switched to Wixela due to insurance changes. He reports feeling that Bryan Armstrong is less effective than Advair . There is a discussion about trying Symbicort as an alternative, which is a pump inhaler. He is open to trying Symbicort and understands the need to take two puffs in the morning and two in the evening. He is informed that if Symbicort is not effective, he  can revert to [facility] by contacting the clinic. - Prescribed Symbicort inhaler, two puffs in the morning and two puffs in the evening. - Instructed to contact the clinic if reverting to [facility] is desired.  Hypotension He experiences episodes of hypotension, sometimes leading to lightheadedness and fainting. He is on midodrine , which is supposed  to raise blood pressure, but its effectiveness is inconsistent. He has adjusted the dosage over time, currently at 7.5 mg. He suspects dehydration may contribute to his symptoms and believes increasing fluid intake might help. - Continue midodrine  for hypotension management. - Encouraged increased fluid intake to address potential dehydration.   Plan/Recommendations: Change Wixela to Symbicort  Lonna Coder MD [facility] 08/22/2024, 3:38 PM  CC: Kennyth Worth HERO, MD

## 2024-10-14 ENCOUNTER — Emergency Department (HOSPITAL_COMMUNITY)

## 2024-10-14 ENCOUNTER — Emergency Department (HOSPITAL_COMMUNITY)
Admission: EM | Admit: 2024-10-14 | Discharge: 2024-10-14 | Disposition: A | Discharge status: Home / Self Care | Source: Home / Self Care

## 2024-10-14 ENCOUNTER — Other Ambulatory Visit: Payer: Self-pay

## 2024-10-14 DIAGNOSIS — R000 Tachycardia, unspecified: Secondary | ICD-10-CM | POA: Diagnosis present

## 2024-10-14 DIAGNOSIS — Z7951 Long term (current) use of inhaled steroids: Secondary | ICD-10-CM | POA: Insufficient documentation

## 2024-10-14 DIAGNOSIS — Z7982 Long term (current) use of aspirin: Secondary | ICD-10-CM | POA: Insufficient documentation

## 2024-10-14 DIAGNOSIS — I4891 Unspecified atrial fibrillation: Secondary | ICD-10-CM | POA: Diagnosis not present

## 2024-10-14 DIAGNOSIS — J449 Chronic obstructive pulmonary disease, unspecified: Secondary | ICD-10-CM | POA: Diagnosis not present

## 2024-10-14 LAB — TROPONIN T, HIGH SENSITIVITY
Troponin T High Sensitivity: 49 ng/L — ABNORMAL HIGH (ref 0–19)
Troponin T High Sensitivity: 66 ng/L — ABNORMAL HIGH (ref 0–19)

## 2024-10-14 LAB — CBC WITH DIFFERENTIAL/PLATELET
Abs Immature Granulocytes: 0.03 K/uL (ref 0.00–0.07)
Basophils Absolute: 0.1 K/uL (ref 0.0–0.1)
Basophils Relative: 1 %
Eosinophils Absolute: 0.4 K/uL (ref 0.0–0.5)
Eosinophils Relative: 4 %
HCT: 38.9 % — ABNORMAL LOW (ref 39.0–52.0)
Hemoglobin: 12.6 g/dL — ABNORMAL LOW (ref 13.0–17.0)
Immature Granulocytes: 0 %
Lymphocytes Relative: 12 %
Lymphs Abs: 1.1 K/uL (ref 0.7–4.0)
MCH: 29 pg (ref 26.0–34.0)
MCHC: 32.4 g/dL (ref 30.0–36.0)
MCV: 89.6 fL (ref 80.0–100.0)
Monocytes Absolute: 0.5 K/uL (ref 0.1–1.0)
Monocytes Relative: 6 %
Neutro Abs: 6.9 K/uL (ref 1.7–7.7)
Neutrophils Relative %: 77 %
Platelets: 188 K/uL (ref 150–400)
RBC: 4.34 MIL/uL (ref 4.22–5.81)
RDW: 13.6 % (ref 11.5–15.5)
WBC: 9 K/uL (ref 4.0–10.5)
nRBC: 0 % (ref 0.0–0.2)

## 2024-10-14 LAB — BASIC METABOLIC PANEL WITH GFR
Anion gap: 9 (ref 5–15)
BUN: 21 mg/dL (ref 8–23)
CO2: 26 mmol/L (ref 22–32)
Calcium: 8.7 mg/dL — ABNORMAL LOW (ref 8.9–10.3)
Chloride: 104 mmol/L (ref 98–111)
Creatinine, Ser: 1.01 mg/dL (ref 0.61–1.24)
GFR, Estimated: >60 mL/min
Glucose, Bld: 114 mg/dL — ABNORMAL HIGH (ref 70–99)
Potassium: 4.2 mmol/L (ref 3.5–5.1)
Sodium: 139 mmol/L (ref 135–145)

## 2024-10-14 LAB — MAGNESIUM: Magnesium: 1.9 mg/dL (ref 1.7–2.4)

## 2024-10-14 MED ORDER — LACTATED RINGERS IV BOLUS
500.0000 mL | Freq: Once | INTRAVENOUS | Status: AC
  Administered 2024-10-14: 500 mL via INTRAVENOUS

## 2024-10-14 NOTE — ED Provider Notes (Signed)
 " [place] [facility] Provider Note   CSN: [id number] Arrival date & time: 10/14/24  1500     Patient presents with: Tachycardia   Bryan Armstrong is a 89 y.o. male.   89 year old male presents for evaluation of tachycardia.  States he was at home and he had sudden onset of palpitations lightheadedness and some chest pain.  States he noticed his blood pressure was low when this happened.  He has no history of A-fib or arrhythmia but does state he occasionally gets low blood pressure from dehydration.  Denies any other symptoms or concerns.  States he currently feels much better.  Per EMS he converted from what looked like A-fib on their monitor to sinus rhythm.        Prior to Admission medications  Medication Sig Start Date End Date Taking? Authorizing Provider  aspirin  81 MG tablet Take 81 mg by mouth daily.     [provider]  budesonide -formoterol  (SYMBICORT ) 160-4.5 MCG/ACT inhaler Inhale 2 puffs into the lungs 2 (two) times daily. 08/22/24   Mannam, Praveen, MD  fluticasone  (FLONASE ) 50 MCG/ACT nasal spray Place 2 sprays into both nostrils as needed for allergies or rhinitis.    [provider]  midodrine  (PROAMATINE ) 5 MG tablet Take 1 tablet (5 mg total) by mouth 3 (three) times daily with meals. 02/09/24   Fernande Elspeth BROCKS, MD  rosuvastatin  (CRESTOR ) 20 MG tablet TAKE 1 TABLET(20 MG) BY MOUTH DAILY AT 6 PM 02/08/24   Kennyth Worth HERO, MD    Allergies: Bee venom and Meclizine     Review of Systems  Constitutional:  Negative for chills and fever.  HENT:  Negative for ear pain and sore throat.   Eyes:  Negative for pain and visual disturbance.  Respiratory:  Positive for shortness of breath. Negative for cough.   Cardiovascular:  Positive for palpitations. Negative for chest pain.  Gastrointestinal:  Negative for abdominal pain and vomiting.  Genitourinary:  Negative for dysuria and hematuria.  Musculoskeletal:  Negative for  arthralgias and back pain.  Skin:  Negative for color change and rash.  Neurological:  Negative for seizures and syncope.  All other systems reviewed and are negative.   Updated Vital Signs BP (!) 136/90   Pulse (!) 59   Temp (!) 97.5 F (36.4 C) (Oral)   Resp 12   Ht 5' 11 (1.803 m)   Wt 88.5 kg   SpO2 97%   BMI 27.20 kg/m   Physical Exam Vitals and nursing note reviewed.  Constitutional:      General: He is not in acute distress.    Appearance: Normal appearance. He is well-developed. He is not ill-appearing.  HENT:     Head: Normocephalic and atraumatic.  Eyes:     Conjunctiva/sclera: Conjunctivae normal.  Cardiovascular:     Rate and Rhythm: Normal rate and regular rhythm.     Pulses: Normal pulses.     Heart sounds: Normal heart sounds. No murmur heard. Pulmonary:     Effort: Pulmonary effort is normal. No respiratory distress.     Breath sounds: Normal breath sounds.  Abdominal:     Palpations: Abdomen is soft.     Tenderness: There is no abdominal tenderness.  Musculoskeletal:        General: No swelling.     Cervical back: Neck supple.  Skin:    General: Skin is warm and dry.     Capillary Refill: Capillary refill takes less than  2 seconds.  Neurological:     General: No focal deficit present.     Mental Status: He is alert.  Psychiatric:        Mood and Affect: Mood normal.     (all labs ordered are listed, but only abnormal results are displayed) Labs Reviewed  BASIC METABOLIC PANEL WITH GFR - Abnormal; Notable for the following components:      Result Value   Glucose, Bld 114 (*)    Calcium  8.7 (*)    All other components within normal limits  CBC WITH DIFFERENTIAL/PLATELET - Abnormal; Notable for the following components:   Hemoglobin 12.6 (*)    HCT 38.9 (*)    All other components within normal limits  TROPONIN T, HIGH SENSITIVITY - Abnormal; Notable for the following components:   Troponin T High Sensitivity 49 (*)    All other  components within normal limits  TROPONIN T, HIGH SENSITIVITY - Abnormal; Notable for the following components:   Troponin T High Sensitivity 66 (*)    All other components within normal limits  MAGNESIUM     EKG: None  Radiology: DG Chest 1 View Result Date: 10/14/2024 CLINICAL DATA:  Chest pain, shortness of breath EXAM: CHEST  1 VIEW COMPARISON:  Feb 13, 2021 FINDINGS: The heart size and mediastinal contours are within normal limits. Both lungs are clear. The visualized skeletal structures are unremarkable. IMPRESSION: No active disease. Electronically Signed   By: Lynwood Landy Raddle M.D.   On: 10/14/2024 16:51     Procedures   Medications Ordered in the ED  lactated ringers  bolus 500 mL (0 mLs Intravenous Stopped 10/14/24 1728)                                    Medical Decision Making Cardiac monitor interpretation: Sinus rhythm, no ectopy  Patient here for an episode of A-fib that started at home and converted on his way here.  EMS is twelve-lead does not show atrial fibrillation with a rate in the 140s.  Here he is sinus rhythm feeling much better and symptoms have resolved.  He was monitored in the emergency department with no recurrence of the A-fib.  Initial troponin 49 and second 66.  Vitals otherwise stable.  I strongly recommend admission for workup for his new onset A-fib and elevated troponins, however he wants to go home and has a follow-up with his cardiologist next week.  His daughter is at bedside.  Discussed with him the risks of going home and not having further workup at this time but he states he will come back if he feels any worse or it happens again.  Advised him to stay on his medications as prescribed and otherwise return for new or worsening symptoms.  He feels comfortable to plan to be discharged.  Problems Addressed: Atrial fibrillation, unspecified type [facility]): undiagnosed new problem with uncertain prognosis  Amount and/or Complexity of Data Reviewed External  Data Reviewed: notes.    Details: Outpatient records reviewed and patient with history of COPD Labs: ordered. Decision-making details documented in ED Course.    Details: Labs ordered and reviewed by me and show troponins first 146 and 166, no other acute abnormality Radiology: ordered and independent interpretation performed. Decision-making details documented in ED Course.    Details: Ordered and interpreted by me independently of radiology Chest x-ray: Shows no acute abnormality ECG/medicine tests: ordered and independent interpretation performed. Decision-making  details documented in ED Course.    Details: Ordered and interpreted by me in the absence of cardiology and shows sinus rhythm, no STEMI, or significant change when compared to prior EKG  Risk OTC drugs. Prescription drug management. Drug therapy requiring intensive monitoring for toxicity.     Final diagnoses:  Atrial fibrillation, unspecified type [facility])    ED Discharge Orders     None          Gennaro Duwaine CROME, DO 10/14/24 2238  "

## 2024-10-14 NOTE — ED Triage Notes (Signed)
 Pt BIB [facility] for SVT and A Fib RVR. Called out for sudden onset squeezing in chest and palpitations. SHOB on exertion.  6 mg of adenosine en route. Returned to arrhythmia after about 20 seconds. self-converted before arrival. NSR rate of 67 on arrival. 700 mL NS given and 324 mg Aspirin .   BP 131/84, lowest 90/60 after adenosine

## 2024-10-14 NOTE — Discharge Instructions (Addendum)
 Call your cardiologist Monday to make a follow-up appointment.  You should also follow-up with your primary care doctor in 1 to 2 weeks.  Stay on your other medications as prescribed.  Return to the ER for any new or worsening

## 2024-10-17 ENCOUNTER — Telehealth: Payer: Self-pay | Admitting: Cardiovascular Disease

## 2024-10-17 NOTE — Telephone Encounter (Signed)
 Patient would like to have Dr. Wonda review ED notes prior to 1/07 appointment if at all possible--just FYI.

## 2024-10-19 ENCOUNTER — Ambulatory Visit: Attending: Cardiovascular Disease | Admitting: Cardiovascular Disease

## 2024-10-19 ENCOUNTER — Other Ambulatory Visit (HOSPITAL_BASED_OUTPATIENT_CLINIC_OR_DEPARTMENT_OTHER): Payer: Self-pay

## 2024-10-19 ENCOUNTER — Encounter: Payer: Self-pay | Admitting: Cardiovascular Disease

## 2024-10-19 ENCOUNTER — Other Ambulatory Visit (HOSPITAL_COMMUNITY): Payer: Self-pay

## 2024-10-19 VITALS — BP 144/74 | HR 62 | Ht 71.0 in | Wt 202.0 lb

## 2024-10-19 DIAGNOSIS — E782 Mixed hyperlipidemia: Secondary | ICD-10-CM

## 2024-10-19 DIAGNOSIS — I951 Orthostatic hypotension: Secondary | ICD-10-CM

## 2024-10-19 DIAGNOSIS — I4891 Unspecified atrial fibrillation: Secondary | ICD-10-CM

## 2024-10-19 MED ORDER — METOPROLOL TARTRATE 25 MG PO TABS
25.0000 mg | ORAL_TABLET | Freq: Two times a day (BID) | ORAL | 3 refills | Status: DC
  Filled 2024-10-19: qty 60, 30d supply, fill #0

## 2024-10-19 MED ORDER — APIXABAN 5 MG PO TABS
5.0000 mg | ORAL_TABLET | Freq: Two times a day (BID) | ORAL | 3 refills | Status: DC
  Filled 2024-10-19: qty 60, 30d supply, fill #0

## 2024-10-19 NOTE — Progress Notes (Signed)
 " Cardiology Office Note:    Date:  10/27/2024   ID:  CHUKWUKA FESTA, DOB 12-29-1934, MRN [id number]  PCP:  Kennyth Worth HERO, MD   [facility] Providers Cardiologist:  Ozell Fell, MD Electrophysiologist:  Elspeth Sage, MD (Inactive)     Referring MD: Kennyth Worth HERO, MD   Chief Complaint  Patient presents with   Follow-up    Orthostatic hypotension    History of Present Illness:    GILMORE LIST is a 89 y.o. male with a hx of orthostatic hypotension, presenting for follow-up evaluation.  The patient also has peripheral arterial disease and has undergone femoral endarterectomy in the past.  He is managed with fludrocortisone  and midodrine . The patient is here with his daughter today. He was recently seen in the ER for heart palpitations and weakness and was found to be in atrial fibrillation with RVR. By the time he arrived in the ER he was feeling better and converted back to sinus rhythm on his own. He's had no symptoms since that single episode. He denies any recent problems with his chronic orthostatic hypotension and he quit taking florinef  due to edema. He continues to use midodrine  but is not as regular with it as he has been in the past. No other complaints.  Past Medical History:  Diagnosis Date   BRADYCARDIA 06/01/2007   Cataract    bil cataracts removed   COPD (chronic obstructive pulmonary disease) (HCC)    EMPHYSEMA 03/28/2008   Emphysema of lung (HCC)    GERD (gastroesophageal reflux disease)    HYPERGLYCEMIA 06/01/2007   HYPERLIPIDEMIA 06/01/2007   Hypertension    HYPERTENSION 01/19/2009   Peripheral arterial disease    TIA (transient ischemic attack)     Current Medications: Active Medications[1]   Allergies:   Bee venom and Meclizine    ROS:   Please see the history of present illness.    All other systems reviewed and are negative.  EKGs/Labs/Other Studies Reviewed:    The following studies were reviewed today: Cardiac Studies & Procedures    ______________________________________________________________________________________________   STRESS TESTS  MYOCARDIAL PERFUSION IMAGING 03/21/2020  Interpretation Summary  Nuclear stress EF: 59%.  There was no ST segment deviation noted during stress.  The study is normal.  This is a low risk study.  The left ventricular ejection fraction is normal (55-65%).   ECHOCARDIOGRAM  ECHOCARDIOGRAM COMPLETE 04/18/2020  Narrative ECHOCARDIOGRAM REPORT    Patient Name:   ERCOLE GEORG Date of Exam: 04/18/2020 Medical Rec #:  [id number]       Height:       71.0 in Accession #:    [id number]      Weight:       205.0 lb Date of Birth:  [birth year]       BSA:          2.131 m Patient Age:    85 years        BP:           171/77 mmHg Patient Gender: M               HR:           57 bpm. Exam Location:  Inpatient  Procedure: 2D Echo  Indications:    TIA 435.9  History:        Patient has no prior history of Echocardiogram examinations. COPD; Risk Factors:Hypertension and Dyslipidemia.  Sonographer:    Tinnie Barefoot Referring Phys: [id number] REDIA LOISE CLEAVER  IMPRESSIONS   1. Left ventricular ejection fraction, by estimation, is 60 to 65%. The left ventricle has normal function. The left ventricle has no regional wall motion abnormalities. Left ventricular diastolic parameters were normal. 2. Right ventricular systolic function is normal. The right ventricular size is normal. 3. Left atrial size was mildly dilated. 4. The mitral valve is normal in structure. Trivial mitral valve regurgitation. No evidence of mitral stenosis. 5. The aortic valve is normal in structure. Aortic valve regurgitation is not visualized. Mild aortic valve sclerosis is present, with no evidence of aortic valve stenosis. 6. The inferior vena cava is normal in size with greater than 50% respiratory variability, suggesting right atrial pressure of 3 mmHg.  FINDINGS Left Ventricle: Left ventricular  ejection fraction, by estimation, is 60 to 65%. The left ventricle has normal function. The left ventricle has no regional wall motion abnormalities. The left ventricular internal cavity size was normal in size. There is no left ventricular hypertrophy. Left ventricular diastolic parameters were normal.  Right Ventricle: The right ventricular size is normal. No increase in right ventricular wall thickness. Right ventricular systolic function is normal.  Left Atrium: Left atrial size was mildly dilated.  Right Atrium: Right atrial size was normal in size.  Pericardium: There is no evidence of pericardial effusion.  Mitral Valve: The mitral valve is normal in structure. Normal mobility of the mitral valve leaflets. Moderate mitral annular calcification. Trivial mitral valve regurgitation. No evidence of mitral valve stenosis.  Tricuspid Valve: The tricuspid valve is normal in structure. Tricuspid valve regurgitation is not demonstrated. No evidence of tricuspid stenosis.  Aortic Valve: The aortic valve is normal in structure. Aortic valve regurgitation is not visualized. Mild aortic valve sclerosis is present, with no evidence of aortic valve stenosis.  Pulmonic Valve: The pulmonic valve was normal in structure. Pulmonic valve regurgitation is not visualized. No evidence of pulmonic stenosis.  Aorta: The aortic root is normal in size and structure.  Venous: The inferior vena cava is normal in size with greater than 50% respiratory variability, suggesting right atrial pressure of 3 mmHg.  IAS/Shunts: No atrial level shunt detected by color flow Doppler.   LEFT VENTRICLE PLAX 2D LVIDd:         5.10 cm  Diastology LVIDs:         3.30 cm  LV e' lateral:   6.64 cm/s LV PW:         1.00 cm  LV E/e' lateral: 9.0 LV IVS:        0.90 cm  LV e' medial:    5.22 cm/s LVOT diam:     2.00 cm  LV E/e' medial:  11.5 LV SV:         82 LV SV Index:   38 LVOT Area:     3.14 cm   RIGHT VENTRICLE              IVC RV S prime:     14.60 cm/s  IVC diam: 1.30 cm TAPSE (M-mode): 3.8 cm  LEFT ATRIUM           Index       RIGHT ATRIUM           Index LA diam:      4.20 cm 1.97 cm/m  RA Area:     16.80 cm LA Vol (A2C): 57.5 ml 26.99 ml/m RA Volume:   41.70 ml  19.57 ml/m AORTIC VALVE LVOT Vmax:   110.00 cm/s LVOT Vmean:  68.600 cm/s LVOT VTI:    0.261 m  AORTA Ao Root diam: 3.90 cm Ao Asc diam:  3.70 cm  MITRAL VALVE MV Area (PHT): 1.86 cm    SHUNTS MV Decel Time: 408 msec    Systemic VTI:  0.26 m MV E velocity: 60.00 cm/s  Systemic Diam: 2.00 cm MV A velocity: 97.30 cm/s MV E/A ratio:  0.62  Maude Emmer MD Electronically signed by Maude Emmer MD Signature Date/Time: 04/18/2020/4:33:40 PM    Final    MONITORS  LONG TERM MONITOR-LIVE TELEMETRY (3-14 DAYS) 10/13/2023  Narrative Indication:syncope  Duration: 11.75d  Findings HR  avg 59  Min 46-Max 95 PVCs Rare, less than 1% PACs 1% SVT Nonsustained 77 episodes; fastest 164 bpm for 10 beats ; longest for 16.7  secs at 128 bpm VT Nonsustained 7 episodes; fastest and longest 203 bpm for 13 beats; Symptoms: LH and r eye fluttering >> sinus with or without PACs   Triggered: rare PAC  Nonsustained  VT and SVT were without symptoms  Conclusions: Clinical correlation   Recommendations       ______________________________________________________________________________________________      EKG:        Recent Labs: 07/13/2024: ALT 30; TSH 4.24 10/14/2024: BUN 21; Creatinine, Ser 1.01; Hemoglobin 12.6; Magnesium  1.9; Platelets 188; Potassium 4.2; Sodium 139  Recent Lipid Panel    Component Value Date/Time   CHOL 95 07/13/2024 1139   TRIG 90.0 07/13/2024 1139   HDL 35.60 (L) 07/13/2024 1139   CHOLHDL 3 07/13/2024 1139   VLDL 18.0 07/13/2024 1139   LDLCALC 41 07/13/2024 1139     Risk Assessment/Calculations:    CHA2DS2-VASc Score = 4   This indicates a 4.8% annual risk of stroke. The patient's  score is based upon: CHF History: 0 HTN History: 1 Diabetes History: 0 Stroke History: 0 Vascular Disease History: 1 Age Score: 2 Gender Score: 0          Physical Exam:    VS:  BP (!) 144/74   Pulse 62   Ht 5' 11 (1.803 m)   Wt 202 lb (91.6 kg)   SpO2 96%   BMI 28.17 kg/m     Wt Readings from Last 3 Encounters:  10/21/24 202 lb 12.8 oz (92 kg)  10/19/24 202 lb (91.6 kg)  10/14/24 195 lb (88.5 kg)     GEN:  Well nourished, well developed in no acute distress HEENT: Normal NECK: No JVD; No carotid bruits LYMPHATICS: No lymphadenopathy CARDIAC: RRR, no murmurs, rubs, gallops RESPIRATORY:  Clear to auscultation without rales, wheezing or rhonchi  ABDOMEN: Soft, non-tender, non-distended MUSCULOSKELETAL:  No edema; No deformity  SKIN: Warm and dry NEUROLOGIC:  Alert and oriented x 3 PSYCHIATRIC:  Normal affect   Assessment & Plan Orthostatic hypotension Stable, using midodrine  and off of florinef . Pushing fluids, currently well-controlled.  Mixed hyperlipidemia Lipids with chol 95, HDL 36, LDL 41. Treated with rosuvastatin  20 mg daily. New onset a-fib [facility]) Discussed natural hx of afib with the patient. He will take metoprolol  as needed for atrial fibrillation or HR > 110 bpm. Stop ASA and start apixaban  5 mg BID. Explained balance between bleeding and thrombotic risk, but clearly indicated with CHADS-Vasc2 score of 4. Reviewed echo and myoview  from 2021 (both normal) and recommend updated 2D echo.             Medication Adjustments/Labs and Tests Ordered: Current medicines are reviewed at length with the patient today.  Concerns regarding medicines are outlined  above.  Orders Placed This Encounter  Procedures   ECHOCARDIOGRAM COMPLETE   Meds ordered this encounter  Medications   DISCONTD: metoprolol  tartrate (LOPRESSOR ) 25 MG tablet    Sig: Take 1 tablet (25 mg total) by mouth 2 (two) times daily.    Dispense:  60 tablet    Refill:  3   DISCONTD:  apixaban  (ELIQUIS ) 5 MG TABS tablet    Sig: Take 1 tablet (5 mg total) by mouth 2 (two) times daily.    Dispense:  60 tablet    Refill:  3    NEW START    Patient Instructions  Medication Instructions:  STOP Aspirin    START Eliquis  5 mg twice daily   AS NEEDED Metoprolol  Tartrate (Lopressor ) 25 mg twice daily for atrial fibrillation episodes  *If you need a refill on your cardiac medications before your next appointment, please call your pharmacy*  Lab Work: None ordered today. If you have labs (blood work) drawn today and your tests are completely normal, you will receive your results only by: MyChart Message (if you have MyChart) OR A paper copy in the mail If you have any lab test that is abnormal or we need to change your treatment, we will call you to review the results.  Testing/Procedures: Your physician has requested that you have an echocardiogram. Echocardiography is a painless test that uses sound waves to create images of your heart. It provides your doctor with information about the size and shape of your heart and how well your hearts chambers and valves are working. This procedure takes approximately one hour. There are no restrictions for this procedure. Please do NOT wear cologne, perfume, aftershave, or lotions (deodorant is allowed). Please arrive 15 minutes prior to your appointment time.  Please note: We ask at that you not bring children with you during ultrasound (echo/ vascular) testing. Due to room size and safety concerns, children are not allowed in the ultrasound rooms during exams. Our front office staff cannot provide observation of children in our lobby area while testing is being conducted. An adult accompanying a patient to their appointment will only be allowed in the ultrasound room at the discretion of the ultrasound technician under special circumstances. We apologize for any inconvenience.   Follow-Up: At [facility], you and your  health needs are our priority.  As part of our continuing mission to provide you with exceptional heart care, our providers are all part of one team.  This team includes your primary Cardiologist (physician) and Advanced Practice Providers or APPs (Physician Assistants and Nurse Practitioners) who all work together to provide you with the care you need, when you need it.  Your next appointment:   3 month(s)  Provider:   One of our Advanced Practice Providers (APPs): Morse Clause, PA-C  Lamarr Satterfield, NP Miriam Shams, NP  Olivia Pavy, PA-C Josefa Beauvais, NP  Leontine Salen, PA-C Orren Fabry, PA-C  [place], PA-C Ernest Dick, NP  Damien Braver, NP Jon Hails, PA-C  Waddell Donath, PA-C    Dayna Dunn, PA-C  Scott Weaver, PA-C Lum Louis, NP Katlyn West, NP Callie Goodrich, PA-C  Xika Zhao, NP Sheng Haley, PA-C    Kathleen Johnson, PA-C   Then, Ozell Fell, MD will plan to see you again in 6 month(s).     Signed, Ozell Fell, MD  10/27/2024 6:18 AM     [facility]     [1]  Current Meds  Medication Sig   midodrine  (PROAMATINE )  5 MG tablet Take 1 tablet (5 mg total) by mouth 3 (three) times daily with meals. (Patient taking differently: Take 5 mg by mouth 3 (three) times daily with meals. Patient takes as needed)   [DISCONTINUED] apixaban  (ELIQUIS ) 5 MG TABS tablet Take 1 tablet (5 mg total) by mouth 2 (two) times daily.   [DISCONTINUED] metoprolol  tartrate (LOPRESSOR ) 25 MG tablet Take 1 tablet (25 mg total) by mouth 2 (two) times daily.   "

## 2024-10-19 NOTE — Patient Instructions (Signed)
 Medication Instructions:  STOP Aspirin    START Eliquis  5 mg twice daily   AS NEEDED Metoprolol  Tartrate (Lopressor ) 25 mg twice daily for atrial fibrillation episodes  *If you need a refill on your cardiac medications before your next appointment, please call your pharmacy*  Lab Work: None ordered today. If you have labs (blood work) drawn today and your tests are completely normal, you will receive your results only by: MyChart Message (if you have MyChart) OR A paper copy in the mail If you have any lab test that is abnormal or we need to change your treatment, we will call you to review the results.  Testing/Procedures: Your physician has requested that you have an echocardiogram. Echocardiography is a painless test that uses sound waves to create images of your heart. It provides your doctor with information about the size and shape of your heart and how well your hearts chambers and valves are working. This procedure takes approximately one hour. There are no restrictions for this procedure. Please do NOT wear cologne, perfume, aftershave, or lotions (deodorant is allowed). Please arrive 15 minutes prior to your appointment time.  Please note: We ask at that you not bring children with you during ultrasound (echo/ vascular) testing. Due to room size and safety concerns, children are not allowed in the ultrasound rooms during exams. Our front office staff cannot provide observation of children in our lobby area while testing is being conducted. An adult accompanying a patient to their appointment will only be allowed in the ultrasound room at the discretion of the ultrasound technician under special circumstances. We apologize for any inconvenience.   Follow-Up: At [facility], you and your health needs are our priority.  As part of our continuing mission to provide you with exceptional heart care, our providers are all part of one team.  This team includes your primary  Cardiologist (physician) and Advanced Practice Providers or APPs (Physician Assistants and Nurse Practitioners) who all work together to provide you with the care you need, when you need it.  Your next appointment:   3 month(s)  Provider:   One of our Advanced Practice Providers (APPs): Morse Clause, PA-C  Lamarr Satterfield, NP Miriam Shams, NP  Olivia Pavy, PA-C Josefa Beauvais, NP  Leontine Salen, PA-C Orren Fabry, PA-C  [place], PA-C Ernest Dick, NP  Damien Braver, NP Jon Hails, PA-C  Waddell Donath, PA-C    Dayna Dunn, PA-C  Scott Weaver, PA-C Lum Louis, NP Katlyn West, NP Callie Goodrich, PA-C  Xika Zhao, NP Sheng Haley, PA-C    Kathleen Johnson, PA-C   Then, Ozell Fell, MD will plan to see you again in 6 month(s).

## 2024-10-21 ENCOUNTER — Encounter: Payer: Self-pay | Admitting: Family Medicine

## 2024-10-21 ENCOUNTER — Ambulatory Visit (INDEPENDENT_AMBULATORY_CARE_PROVIDER_SITE_OTHER): Admitting: Family Medicine

## 2024-10-21 VITALS — BP 130/70 | HR 62 | Temp 97.7°F | Ht 71.0 in | Wt 202.8 lb

## 2024-10-21 DIAGNOSIS — I951 Orthostatic hypotension: Secondary | ICD-10-CM | POA: Diagnosis not present

## 2024-10-21 DIAGNOSIS — I4891 Unspecified atrial fibrillation: Secondary | ICD-10-CM | POA: Diagnosis not present

## 2024-10-21 NOTE — Assessment & Plan Note (Signed)
 Follows with cardiology.  On midodrine  5 mg as needed.

## 2024-10-21 NOTE — Progress Notes (Signed)
" ° °  Bryan Armstrong is a 89 y.o. male who presents today for an office visit.  Assessment/Plan:  Chronic Problems Addressed Today: Atrial fibrillation (HCC) Regular rate and rhythm today.  Discussed recent episode with this ER visit and paroxysmal A-fib.  He has been consistent with his Eliquis .  He we will continue with this.  Will defer further management to cardiology though he will let us  know if he needs any further assistance.  Orthostatic hypotension Follows with cardiology.  On midodrine  5 mg as needed.     Subjective:  HPI:  See assessment / plan for status of chronic conditions.  Patient is here today for ED follow-up.  He was in the ED 1 week ago with rapid heart rate and some chest pain.  911 was called and EMS thought that he converted from what look like A-fib to sinus rhythm.  In the ED he did not have any obvious recurrence of A-fib.  He was noted to have elevated troponin and admission was recommended however he wished to go home to follow-up with cardiology as outpatient.  He saw his cardiologist 2 days ago and was started on Eliquis      Discussed the use of AI scribe software for clinical note transcription with the patient, who gave verbal consent to proceed.  History of Present Illness Bryan Armstrong is an 89 year old male who presents with a recent episode of atrial fibrillation and chest pain.  He experienced a rapid heart rate and chest pain one week ago, which led to a 911 call. During transport, EMS noted a conversion from atrial fibrillation to sinus rhythm. In the emergency department, elevated troponin levels were observed, but there was no recurrence of atrial fibrillation. He declined admission and chose outpatient follow-up.  Two days ago, he consulted with his cardiologist and was prescribed Eliquis  for stroke prevention due to atrial fibrillation. He was also given another medication to take at the onset of symptoms to potentially correct the  rhythm. He currently feels fine, similar to before the episode, but was initially startled by the sensation, which he described as unlike anything he had felt before. The sensation was persistent, prompting him to seek emergency care.  He has a history of neuropathy, which affects his ability to play golf due to the risk of falls. He experiences dizziness and fogginess when his blood pressure drops, for which he takes midodrine  as needed.  He recalls a previous experience with a blood thinner that caused easy bruising, but cannot remember the name.         Objective:  Physical Exam: BP 130/70   Pulse 62   Temp 97.7 F (36.5 C) (Temporal)   Ht 5' 11 (1.803 m)   Wt 202 lb 12.8 oz (92 kg)   SpO2 96%   BMI 28.28 kg/m   Gen: No acute distress, resting comfortably CV: Regular rate and rhythm with no murmurs appreciated Pulm: Normal work of breathing, clear to auscultation bilaterally with no crackles, wheezes, or rhonchi Neuro: Grossly normal, moves all extremities Psych: Normal affect and thought content      Kimberlie Csaszar M. Kennyth, MD 10/21/2024 2:42 PM  "

## 2024-10-21 NOTE — Patient Instructions (Signed)
 It was very nice to see you today!  VISIT SUMMARY: During your visit, we discussed your recent episode of atrial fibrillation and chest pain, as well as your ongoing management for orthostatic hypotension.  YOUR PLAN: ATRIAL FIBRILLATION: You recently experienced an episode of atrial fibrillation with a rapid heart rate and chest pain, which converted to a normal rhythm on its own. Elevated troponin levels were noted. -Continue taking Eliquis  for stroke prevention as prescribed by your cardiologist. -Monitor for any symptoms of atrial fibrillation and adhere to your medication schedule. -Follow up with your cardiologist as needed.  ORTHOSTATIC HYPOTENSION: You experience dizziness and faintness when your blood pressure drops, and you use midodrine  for relief. -Continue taking midodrine  as needed for symptomatic relief. -Monitor your blood pressure regularly. -Exercise caution to prevent falls, especially due to the increased bleeding risk with anticoagulation.  Return in about 8 months (around 06/21/2025) for Annual Physical.   Take care, Dr Kennyth  PLEASE NOTE:  If you had any lab tests, please let us  know if you have not heard back within a few days. You may see your results on mychart before we have a chance to review them but we will give you a call once they are reviewed by us .   If we ordered any referrals today, please let us  know if you have not heard from their office within the next week.   If you had any urgent prescriptions sent in today, please check with the pharmacy within an hour of our visit to make sure the prescription was transmitted appropriately.   Please try these tips to maintain a healthy lifestyle:  Eat at least 3 REAL meals and 1-2 snacks per day.  Aim for no more than 5 hours between eating.  If you eat breakfast, please do so within one hour of getting up.   Each meal should contain half fruits/vegetables, one quarter protein, and one quarter carbs (no  bigger than a computer mouse)  Cut down on sweet beverages. This includes juice, soda, and sweet tea.   Drink at least 1 glass of water with each meal and aim for at least 8 glasses per day  Exercise at least 150 minutes every week.

## 2024-10-21 NOTE — Assessment & Plan Note (Signed)
 Regular rate and rhythm today.  Discussed recent episode with this ER visit and paroxysmal A-fib.  He has been consistent with his Eliquis .  He we will continue with this.  Will defer further management to cardiology though he will let us  know if he needs any further assistance.

## 2024-10-24 ENCOUNTER — Other Ambulatory Visit (HOSPITAL_COMMUNITY): Payer: Self-pay

## 2024-10-24 ENCOUNTER — Other Ambulatory Visit (HOSPITAL_BASED_OUTPATIENT_CLINIC_OR_DEPARTMENT_OTHER): Payer: Self-pay

## 2024-10-24 ENCOUNTER — Other Ambulatory Visit: Payer: Self-pay

## 2024-10-24 ENCOUNTER — Telehealth: Payer: Self-pay | Admitting: Cardiovascular Disease

## 2024-10-24 MED ORDER — METOPROLOL TARTRATE 25 MG PO TABS
25.0000 mg | ORAL_TABLET | Freq: Two times a day (BID) | ORAL | 3 refills | Status: AC | PRN
  Filled 2024-10-24: qty 60, 30d supply, fill #0

## 2024-10-24 MED ORDER — APIXABAN 5 MG PO TABS: 5.0000 mg | ORAL_TABLET | Freq: Two times a day (BID) | ORAL | 5 refills | Status: DC

## 2024-10-24 NOTE — Telephone Encounter (Signed)
 Pt c/o medication issue:  1. Name of Medication: metoprolol  tartrate (LOPRESSOR ) 25 MG tablet   2. How are you currently taking this medication (dosage and times per day)?   3. Are you having a reaction (difficulty breathing--STAT)? No  4. What is your medication issue? Pt would like a c/b as to how he is to take medication. I read the instructions to pt but he states that he was told to take it another way. Please advise

## 2024-10-24 NOTE — Telephone Encounter (Signed)
 Spoke with patient, reviewed Metoprolol  instructions per Dr Wonda last office visit. Changed to prn in pt chart-per ordered. Pt also inquiring about Eliquis  and asking for assistance. Stated he got a call from pharmacy that his refill would be around $250 and is unable to afford this. Will send to medication assistance for further assistance. Pt verbalizes understanding of plan and awaiting call from pharmacy for further advisement.

## 2024-10-25 ENCOUNTER — Telehealth: Payer: Self-pay | Admitting: Pharmacy Technician

## 2024-10-25 ENCOUNTER — Other Ambulatory Visit (HOSPITAL_COMMUNITY): Payer: Self-pay

## 2024-10-25 NOTE — Telephone Encounter (Signed)
" °  I called the patient to go over his options and had to leave a message  "

## 2024-10-25 NOTE — Telephone Encounter (Signed)
 Patient called back and said he is able to get the eliquis  through his mailorder and after 400 it will be free so he is going to do that

## 2024-10-25 NOTE — Telephone Encounter (Signed)
" ° °  Xarelto is also 207.18.    Dabigatran is 96.51 for 30 days on insurance or 56.66 on goodrx at [facility]  Warfarin would be cheap He does not have cardiomyopathy so unable to get a grant [employer] requires 3% of income to of been paid before approving assistance.   I called the patient and had to leave a message  "

## 2024-10-27 ENCOUNTER — Encounter: Payer: Self-pay | Admitting: Cardiovascular Disease

## 2024-10-27 NOTE — Assessment & Plan Note (Signed)
 Stable, using midodrine  and off of florinef . Pushing fluids, currently well-controlled.

## 2024-10-31 ENCOUNTER — Other Ambulatory Visit (HOSPITAL_BASED_OUTPATIENT_CLINIC_OR_DEPARTMENT_OTHER): Payer: Self-pay

## 2024-10-31 ENCOUNTER — Other Ambulatory Visit: Payer: Self-pay | Admitting: Cardiovascular Disease

## 2024-11-03 ENCOUNTER — Other Ambulatory Visit: Payer: Self-pay

## 2024-11-03 ENCOUNTER — Other Ambulatory Visit (HOSPITAL_BASED_OUTPATIENT_CLINIC_OR_DEPARTMENT_OTHER): Payer: Self-pay

## 2024-11-03 MED ORDER — APIXABAN 5 MG PO TABS
5.0000 mg | ORAL_TABLET | Freq: Two times a day (BID) | ORAL | 5 refills | Status: AC
  Filled 2024-11-03: qty 60, 30d supply, fill #0

## 2024-11-09 ENCOUNTER — Other Ambulatory Visit (HOSPITAL_BASED_OUTPATIENT_CLINIC_OR_DEPARTMENT_OTHER): Payer: Self-pay

## 2024-11-11 ENCOUNTER — Other Ambulatory Visit (HOSPITAL_BASED_OUTPATIENT_CLINIC_OR_DEPARTMENT_OTHER): Payer: Self-pay

## 2024-11-18 ENCOUNTER — Ambulatory Visit: Admitting: Cardiovascular Disease

## 2024-11-21 ENCOUNTER — Ambulatory Visit (HOSPITAL_COMMUNITY)

## 2024-11-21 ENCOUNTER — Ambulatory Visit: Admitting: Adult Health

## 2024-12-13 ENCOUNTER — Ambulatory Visit: Admitting: Adult Health

## 2024-12-21 ENCOUNTER — Encounter (INDEPENDENT_AMBULATORY_CARE_PROVIDER_SITE_OTHER): Admitting: Ophthalmology

## 2025-01-06 ENCOUNTER — Ambulatory Visit: Admitting: Physician Assistant

## 2025-03-23 ENCOUNTER — Other Ambulatory Visit

## 2025-03-23 ENCOUNTER — Ambulatory Visit: Admitting: Oncology

## 2025-05-04 ENCOUNTER — Ambulatory Visit

## 2025-06-29 ENCOUNTER — Encounter: Admitting: Family Medicine
